# Patient Record
Sex: Male | Born: 1965
Health system: Southern US, Community
[De-identification: ages and names within clinical notes are randomized; demographics above are authoritative.]

## PROBLEM LIST (undated history)

## (undated) DIAGNOSIS — M1990 Unspecified osteoarthritis, unspecified site: Secondary | ICD-10-CM

## (undated) DIAGNOSIS — Z86010 Personal history of colonic polyps: Secondary | ICD-10-CM

## (undated) DIAGNOSIS — M545 Low back pain, unspecified: Secondary | ICD-10-CM

## (undated) DIAGNOSIS — R51 Headache: Secondary | ICD-10-CM

## (undated) DIAGNOSIS — J449 Chronic obstructive pulmonary disease, unspecified: Secondary | ICD-10-CM

## (undated) DIAGNOSIS — R519 Headache, unspecified: Secondary | ICD-10-CM

## (undated) DIAGNOSIS — K219 Gastro-esophageal reflux disease without esophagitis: Secondary | ICD-10-CM

## (undated) DIAGNOSIS — Z9889 Other specified postprocedural states: Secondary | ICD-10-CM

## (undated) DIAGNOSIS — K6289 Other specified diseases of anus and rectum: Secondary | ICD-10-CM

## (undated) DIAGNOSIS — K5792 Diverticulitis of intestine, part unspecified, without perforation or abscess without bleeding: Secondary | ICD-10-CM

## (undated) DIAGNOSIS — K645 Perianal venous thrombosis: Secondary | ICD-10-CM

## (undated) HISTORY — DX: Other specified diseases of anus and rectum: K62.89

## (undated) HISTORY — DX: Headache: R51

## (undated) HISTORY — PX: COLON SURGERY: SHX602

## (undated) HISTORY — DX: Gastro-esophageal reflux disease without esophagitis: K21.9

## (undated) HISTORY — DX: Headache, unspecified: R51.9

## (undated) HISTORY — DX: Other specified postprocedural states: Z98.890

## (undated) HISTORY — DX: Perianal venous thrombosis: K64.5

## (undated) HISTORY — DX: Unspecified osteoarthritis, unspecified site: M19.90

## (undated) HISTORY — DX: Personal history of colonic polyps: Z86.010

---
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
## 1986-07-06 HISTORY — PX: APPENDECTOMY: SHX54

## 1987-07-07 HISTORY — PX: CHOLECYSTECTOMY: SHX55

## 2011-03-04 ENCOUNTER — Ambulatory Visit: Payer: Self-pay

## 2011-05-27 ENCOUNTER — Ambulatory Visit: Payer: Self-pay | Admitting: Gastroenterology

## 2011-05-29 LAB — PATHOLOGY REPORT

## 2011-06-08 ENCOUNTER — Ambulatory Visit (INDEPENDENT_AMBULATORY_CARE_PROVIDER_SITE_OTHER): Payer: BC Managed Care – PPO | Admitting: General Surgery

## 2011-06-08 ENCOUNTER — Encounter (INDEPENDENT_AMBULATORY_CARE_PROVIDER_SITE_OTHER): Payer: Self-pay | Admitting: General Surgery

## 2011-06-08 VITALS — BP 130/92 | HR 66 | Temp 97.9°F | Resp 18 | Ht 69.0 in | Wt 204.0 lb

## 2011-06-08 DIAGNOSIS — K644 Residual hemorrhoidal skin tags: Secondary | ICD-10-CM

## 2011-06-08 MED ORDER — HYDROCODONE-ACETAMINOPHEN 5-325 MG PO TABS: 1.0000 | ORAL_TABLET | ORAL | Status: AC | PRN

## 2011-06-08 NOTE — Patient Instructions (Signed)
Warm tub soaks after bowel movement Bedrest Alternate ice packs and tucks pads Colace stool softener twice a day Pain meds as needed

## 2011-06-12 NOTE — Progress Notes (Signed)
Subjective:     Patient ID: Peter [name], male   DOB: 10/10/1965, 45 y.o.   MRN: [id number]  HPI We are asked to see Peter [name] in consultation by the doctors at [facility] to evaluate him for hemorrhoids. The patient is a 45 year old white male who has noticed a swollen area near his rectum since last Thursday. The area has been painful for him. He has not noticed any blood with his bowel movements. He denies any diarrhea or constipation. He denies any fevers or chills  Review of Systems  Constitutional: Negative.   HENT: Negative.   Eyes: Negative.   Respiratory: Negative.   Cardiovascular: Negative.   Gastrointestinal: Positive for rectal pain.  Genitourinary: Negative.   Musculoskeletal: Negative.   Skin: Negative.   Neurological: Negative.   Hematological: Negative.   Psychiatric/Behavioral: Negative.        Objective:   Physical Exam  Constitutional: He is oriented to person, place, and time. He appears well-developed and well-nourished.  HENT:  Head: Normocephalic and atraumatic.  Eyes: Conjunctivae and EOM are normal. Pupils are equal, round, and reactive to light.  Neck: Normal range of motion. Neck supple.  Cardiovascular: Normal rate, regular rhythm and normal heart sounds.   Pulmonary/Chest: Effort normal and breath sounds normal.  Abdominal: Soft. Bowel sounds are normal.  Genitourinary:       The patient has a moderate sized edematous swollen external hemorrhoid. There is no evidence of thrombosis.  Musculoskeletal: Normal range of motion.  Neurological: He is alert and oriented to person, place, and time.  Skin: Skin is warm and dry.  Psychiatric: He has a normal mood and affect. His behavior is normal.       Assessment:     Swollen and edematous external hemorrhoid    Plan:     I recommended bed rest. I would like him to alternate putting ice packs and Tucks pads on the area. I've also recommended some stool softeners. We will plan to see  him next week to check his progress.I do not think he needs unroofing of this hemorrhoid at this time.

## 2011-06-15 ENCOUNTER — Encounter (INDEPENDENT_AMBULATORY_CARE_PROVIDER_SITE_OTHER): Payer: BC Managed Care – PPO | Admitting: General Surgery

## 2011-09-09 ENCOUNTER — Inpatient Hospital Stay: Payer: Self-pay | Admitting: Surgery

## 2011-09-09 LAB — URINALYSIS, COMPLETE
Bacteria: NONE SEEN
Bilirubin,UR: NEGATIVE
Glucose,UR: NEGATIVE mg/dL (ref 0–75)
Leukocyte Esterase: NEGATIVE
Nitrite: NEGATIVE
RBC,UR: 1 /HPF (ref 0–5)
Squamous Epithelial: NONE SEEN
WBC UR: NONE SEEN /HPF (ref 0–5)

## 2011-09-09 LAB — COMPREHENSIVE METABOLIC PANEL
Albumin: 4.4 g/dL (ref 3.4–5.0)
Alkaline Phosphatase: 55 U/L (ref 50–136)
Anion Gap: 10 (ref 7–16)
Bilirubin,Total: 0.4 mg/dL (ref 0.2–1.0)
Creatinine: 1.02 mg/dL (ref 0.60–1.30)
EGFR (African American): >60
EGFR (Non-African Amer.): >60
Glucose: 95 mg/dL (ref 65–99)
Osmolality: 278 (ref 275–301)
Potassium: 4.1 mmol/L (ref 3.5–5.1)
Sodium: 140 mmol/L (ref 136–145)

## 2011-09-09 LAB — CBC
MCH: 31.2 pg (ref 26.0–34.0)
MCHC: 33.6 g/dL (ref 32.0–36.0)
MCV: 93 fL (ref 80–100)
Platelet: 218 10*3/uL (ref 150–440)
RDW: 13.6 % (ref 11.5–14.5)
WBC: 8.7 10*3/uL (ref 3.8–10.6)

## 2011-09-10 LAB — BASIC METABOLIC PANEL
Anion Gap: 10 (ref 7–16)
Calcium, Total: 8.5 mg/dL (ref 8.5–10.1)
Co2: 27 mmol/L (ref 21–32)
Creatinine: 0.93 mg/dL (ref 0.60–1.30)
EGFR (African American): >60
EGFR (Non-African Amer.): >60
Potassium: 3.5 mmol/L (ref 3.5–5.1)
Sodium: 142 mmol/L (ref 136–145)

## 2011-09-10 LAB — CBC WITH DIFFERENTIAL/PLATELET
Basophil #: 0.1 10*3/uL (ref 0.0–0.1)
Eosinophil #: 0.1 10*3/uL (ref 0.0–0.7)
Lymphocyte #: 1.8 10*3/uL (ref 1.0–3.6)
MCH: 31 pg (ref 26.0–34.0)
MCHC: 33.3 g/dL (ref 32.0–36.0)
Monocyte #: 0.6 10*3/uL (ref 0.0–0.7)
Neutrophil %: 65.3 %
Platelet: 190 10*3/uL (ref 150–440)
RBC: 4.47 10*6/uL (ref 4.40–5.90)
RDW: 13.6 % (ref 11.5–14.5)
WBC: 7.6 10*3/uL (ref 3.8–10.6)

## 2011-09-12 LAB — CBC WITH DIFFERENTIAL/PLATELET
Basophil %: 0.6 %
Eosinophil #: 0.1 10*3/uL (ref 0.0–0.7)
HGB: 14 g/dL (ref 13.0–18.0)
Lymphocyte #: 1.2 10*3/uL (ref 1.0–3.6)
MCH: 31.3 pg (ref 26.0–34.0)
MCHC: 33.9 g/dL (ref 32.0–36.0)
MCV: 92 fL (ref 80–100)
Monocyte #: 0.8 10*3/uL — ABNORMAL HIGH (ref 0.0–0.7)
Neutrophil #: 6.5 10*3/uL (ref 1.4–6.5)
Neutrophil %: 75 %
WBC: 8.7 10*3/uL (ref 3.8–10.6)

## 2011-09-13 LAB — CBC WITH DIFFERENTIAL/PLATELET
Eosinophil #: 0.1 10*3/uL (ref 0.0–0.7)
Eosinophil %: 0.9 %
Lymphocyte #: 1.1 10*3/uL (ref 1.0–3.6)
Lymphocyte %: 13.2 %
Monocyte %: 7.6 %
Neutrophil %: 78.2 %
Platelet: 186 10*3/uL (ref 150–440)
RDW: 13.5 % (ref 11.5–14.5)
WBC: 8.1 10*3/uL (ref 3.8–10.6)

## 2014-10-28 NOTE — Consult Note (Signed)
Chief Complaint:   Subjective/Chief Complaint Overall better. Less abd pain. To be discharged later today, according to patient. Will need elective sigmoid resection. Not scheduled yet.   VITAL SIGNS/ANCILLARY NOTES: **Vital Signs.:   11-Mar-13 10:39   Vital Signs Type Q 4hr   Temperature Temperature (F) 98.3   Celsius 36.8   Temperature Source oral   Pulse Pulse 71   Pulse source per Dinamap   Respirations Respirations 18   Systolic BP Systolic BP 114   Diastolic BP (mmHg) Diastolic BP (mmHg) 77   Mean BP 89   BP Source Dinamap   Pulse Ox % Pulse Ox % 98   Pulse Ox Activity Level  At rest   Oxygen Delivery Room Air/ 21 %   Brief Assessment:   Cardiac Regular    Respiratory clear BS    Gastrointestinal low abd tenderness   Routine Hem:  10-Mar-13 10:52    WBC (CBC) 8.1   RBC (CBC) 4.43   Hemoglobin (CBC) 13.8   Hematocrit (CBC) 40.6   Platelet Count (CBC) 186   MCV 92   MCH 31.2   MCHC 34.1   RDW 13.5   Neutrophil % 78.2   Lymphocyte % 13.2   Monocyte % 7.6   Eosinophil % 0.9   Basophil % 0.1   Neutrophil # 6.3   Lymphocyte # 1.1   Monocyte # 0.6   Eosinophil # 0.1   Basophil # 0.0   Radiology Results: CT:    09-Mar-13 14:32, CT Abdomen and Pelvis With Contrast   CT Abdomen and Pelvis With Contrast    REASON FOR EXAM:    (1) diverticulitis; (2) same  COMMENTS:       PROCEDURE: CT  - CT ABDOMEN / PELVIS  W  - Sep 12 2011  2:32PM     RESULT: Comparison is made to a prior study dated 09/09/2011.    Technique: Helical 3 mm sections were obtained from the lung bases   through the pubic symphysis status post intravenous administration of 100   mL of Isovue-300 and oral contrast.    Findings: The lung bases are unremarkable.    The liver, spleen, adrenals, pancreas, and kidneys are unremarkable.   Calcified densities project in the spleen indicative of granulomas.  There is no CT evidence of bowel obstruction. Within the pelvis findings   are once again  appreciated with the previously described area of   diverticulitis. The focal loculated fluid collection projecting in the   region of the wall of the sigmoid colon is less apparent on the present   study. The area is vaguely appreciated measuring 3.54 x 2.23 cm. No   further evidence of loculated fluid is identified. The urinary bladder is   distended. There is no CT evidence of bowel obstruction.    IMPRESSION:  Findings consistent with the patient's recent diagnosis of   diverticulitis. The region concerning for fluid collection within the   bowel wall possibly representing an abscess is vaguely appreciated and   appears slightly less conspicuous. No new fluid collections are   identified.     Thank you for the opportunity to contribute to the care of your patient.     Verified By: Jani FilesHECTOR W. COOPER, M.D., MD   Assessment/Plan:  Assessment/Plan:   Assessment Diverticulitis. Clinically improving.    Plan Continue Abx. Likely needs surgical resection. To be determined by surgery. Will sign off. Thanks.   Electronic Signatures: Lutricia Feilh, Jasiah Elsen (MD)  (Signed  11-Mar-13 11:57)  Authored: Chief Complaint, VITAL SIGNS/ANCILLARY NOTES, Brief Assessment, Lab Results, Radiology Results, Assessment/Plan   Last Updated: 11-Mar-13 11:57 by Lutricia Feil (MD)

## 2014-10-28 NOTE — Consult Note (Signed)
PATIENT NAME:  Peter [name], Peter [name] MR#:  [id number] DATE OF BIRTH:  June 08, 1966  DATE OF CONSULTATION:  09/09/2011  REFERRING PHYSICIAN:   CONSULTING PHYSICIAN:  Carmie Endalph L. Ely III, MD  PRIMARY CARE PHYSICIAN: Dr. Vonita MossMark Crissman.  ADMITTING PHYSICIAN: Prime Doc.   CHIEF COMPLAINT: Abdominal pain.   BRIEF HISTORY: Peter FalconerMark [name] is [name] 49 year old gentleman seen in the Emergency Room with [name] several month history of abdominal pain exacerbated over the last several days. He was followed by his primary care physician's office with back and abdominal pain. Initial work-up was unremarkable, but his symptoms did not improve. He was seen by the gastroenterology service in consultation. Dr. Lutricia FeilPaul Oh performed an upper and lower endoscopy. Upper endoscopy revealed some mild Barrett's esophagitis thought to be related to his anti-inflammatory medication use. His colonoscopy revealed some evidence of diverticulosis without any evidence of significant neoplastic lesion or other mucosal abnormality. His pain has increased over the last several days to the point that he is now having fever and chills with nausea and vomiting and some loose stools. The pain intensified over the course of the day. He noted [name] black stool this morning, contacted his gastroenterology office and they recommended he present to the  Emergency Room for further evaluation.   He denies any previous problems with diverticulitis. He has no history of hepatitis, yellow jaundice, pancreatitis, peptic ulcer disease. He does have [name] history of appendectomy and cholecystectomy using the open procedure. He has had no other abdominal history. He denies any cardiac disease, hypertension, or diabetes. He has not been hospitalized for anything within the last 12 months.   Work-up in the Emergency Room revealed normal laboratory values for the most part. He did not have any elevation of his white blood cell count. CT scan with contrast revealed some significant bowel  wall thickening in the distal sigmoid colon with rather surrounding inflammatory change suggestive of possible diverticulitis. There was [name] 3 x 3 fluid collection around the wall of the bowel concerning for an abscess. There did not appear to be any evidence of free air.   SOCIAL HISTORY: This gentleman is [name] smoker and drinks alcohol occasionally. He smokes [name] pack of cigarettes [name] day. 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 He is accompanied by his brother this evening.   FAMILY HISTORY: Noncontributory.   MEDICATIONS: He takes no medications regularly.   ALLERGIES: He has no medical allergies.   REVIEW OF SYSTEMS: Largely unremarkable. He has no visual or hearing problems. He has no difficulty swallowing. He has no neck or head pain. He denies any shortness of breath or exercise intolerance. Denies any chest pain or cardiac dysrhythmia. His gastrointestinal symptoms were noted above. He has had some mild dysuria which clears with voiding. He does not have any urgency. He does not have [name] history of urinary tract infections. He has no lower extremity symptoms. He does have some mild chronic back pain. PSYCHIATRIC: Psychiatric review of systems is unremarkable.   PHYSICAL EXAMINATION:  GENERAL: He is an alert, pleasant, comfortable gentleman in no significant distress at the present time. He said his pain is improved.   VITAL SIGNS: Blood pressure 140/78, heart rate 97 and regular. He is afebrile. Oxygen saturation is normal on room air.   HEENT: No scleral icterus or pupillary problems. He has no facial deformity.   NECK: Supple without adenopathy and the trachea is midline.   CHEST: Clear with no adventitious sounds. He has no problem with pulmonary  excursion.   CARDIAC: No murmurs or gallops to my ear. He seems to be in normal sinus rhythm.   ABDOMEN: For the most part is soft, nondistended, with some suprapubic tenderness, but no rebound and no guarding. He does not have any hernias  noted. He does not have any masses noted. He does have active bowel sounds.   EXTREMITIES: Lower extremity exam reveals full range of motion. Good distal pulses. No deformities.   PSYCHIATRIC: Normal affect and normal orientation.   IMPRESSION: I have independently reviewed his CT scan. This gentleman does appear to have [name] pericolonic abscess associated with probable diverticulitis. In the absence of any previously identified mucosal lesions, diverticulitis is the most likely diagnosis in this situation.   RECOMMENDATIONS: I agree with the current plans for intervention. I would treat him with multidose antibiotic therapy. I do not see any acute surgical indications. In the event surgery is required urgently, temporary colostomy will likely be required. I discussed this plan with the patient and his brother. If he recovers uneventfully from this episode, I would like recommend the consideration of an elective colectomy since he had such [name] severe episode and he is [name] young man. We will continue to be available while he is hospitalized and involved in his care.   ____________________________ Carmie End, MD rle:ap D: 09/09/2011 22:16:31 ET T: 09/10/2011 10:02:46 ET JOB#: [id number]  cc: Carmie End, MD, <Dictator> Steele Sizer, MD Ezzard Standing. Bluford Kaufmann, MD Quentin Ore MD ELECTRONICALLY SIGNED 09/10/2011 20:22

## 2014-10-28 NOTE — Consult Note (Signed)
Pt seen and examined. See Dawn Harrison's notes. Pt with diverticulitis and abscess with  LLQ abd tenderness. Agree with Abx. Follow surgical recommendations. THanks.  Electronic Signatures: Lutricia Feilh, Moncerrat Burnstein (MD)  (Signed on 07-Mar-13 13:01)  Authored  Last Updated: 07-Mar-13 13:01 by Lutricia Feilh, Cheyne Boulden (MD)

## 2014-10-28 NOTE — Consult Note (Signed)
Chief Complaint:   Subjective/Chief Complaint Slowly improving. On solids now. No BM since admission.   VITAL SIGNS/ANCILLARY NOTES: **Vital Signs.:   08-Mar-13 10:41   Temperature Temperature (F) 99.5   Celsius 37.5   Temperature Source oral   Pulse Pulse 85   Respirations Respirations 20   Systolic BP Systolic BP 375   Diastolic BP (mmHg) Diastolic BP (mmHg) 76   Mean BP 89   Pulse Ox % Pulse Ox % 96   Pulse Ox Activity Level  At rest   Oxygen Delivery Room Air/ 21 %   Brief Assessment:   Cardiac Regular    Respiratory clear BS    Gastrointestinal mild low abd tenderness   Routine Hem:  07-Mar-13 04:11    WBC (CBC) 7.6   RBC (CBC) 4.47   Hemoglobin (CBC) 13.8   Hematocrit (CBC) 41.5   Platelet Count (CBC) 190   MCV 93   MCH 31.0   MCHC 33.3   RDW 13.6  Routine Chem:  07-Mar-13 04:11    Glucose, Serum 90   BUN 7   Creatinine (comp) 0.93   Sodium, Serum 142   Potassium, Serum 3.5   Chloride, Serum 105   CO2, Serum 27   Calcium (Total), Serum 8.5   Osmolality (calc) 281   eGFR (African American) >60   eGFR (Non-African American) >60   Anion Gap 10  Routine Hem:  07-Mar-13 04:11    Neutrophil % 65.3   Lymphocyte % 24.0   Monocyte % 8.2   Eosinophil % 1.7   Basophil % 0.8   Neutrophil # 4.9   Lymphocyte # 1.8   Monocyte # 0.6   Eosinophil # 0.1   Basophil # 0.1   Assessment/Plan:  Assessment/Plan:   Assessment Diverticulitis. Improving.    Plan Continue Abx. Follow surgical recommendations. Will check back on Sunday if patient still here. Otherwise, call us over the weekend if condition changes. Thanks.   Electronic Signatures: Verdie Shire (MD)  (Signed 08-Mar-13 12:34)  Authored: Chief Complaint, VITAL SIGNS/ANCILLARY NOTES, Brief Assessment, Lab Results, Assessment/Plan   Last Updated: 08-Mar-13 12:34 by Verdie Shire (MD)

## 2014-10-28 NOTE — H&P (Signed)
PATIENT NAME:  Peter [name], Peter [name] MR#:  [id number] DATE OF BIRTH:  Nov 30, 1965  DATE OF ADMISSION:  09/09/2011  REFERRING PHYSICIAN: Dr. Enedina Finner. PRIMARY CARE PHYSICIAN:  Dr. Dossie Arbour. PRIMARY GASTROENTEROLOGIST:  Dr. Lutricia Feil.   CHIEF COMPLAINT: Abdominal pain.  HISTORY OF PRESENT ILLNESS:  The patient is a 49 year old male with past medical history listed below including Barrett's esophagus with a history of esophagitis, likely NSAID-induced secondary to [facility] powder usage, gastroesophageal reflux disease, sigmoid diverticulosis, benign colon polyps, tobacco abuse and bouts of bronchitis who presents to the Emergency Department with the above-mentioned chief complaint. The patient states he has had abdominal pain on and off for the past couple of years which has been worse over the past couple of months. He was referred to gastroenterology as an outpatient and underwent endoscopy, both upper and lower 05/27/2011 Esophagogastroduodenoscopy revealed LA grade A esophagitis with pathology revealing Barrett's esophagus and reflux esophagitis with no dysplasia noted. Colonoscopy revealed sigmoid diverticulosis as well as hyperplastic rectosigmoid polyps which were negative for dysplasia malignancy. He was advised to stop taking NSAIDs/BC powders and was placed on PPI therapy for esophagitis. However, since then he has continued to have abdominal pain over the past couple of months and this has been worse over the past couple of days and 10/10 crampy pain mostly in the bilateral lower quadrants. Also, in the suprapubic region associated with nausea and a couple episodes of nonbloody emesis. He also had one episode of black stools this morning which is semisolid. He called his gastroenterologist and advised to come to the hospital for further evaluation. He also reports subjective fevers and chills. He is currently afebrile and without leukocytosis. He underwent a CT of the abdomen and pelvis with contrast in the ER  revealing sigmoid diverticulitis with diverticular abscess approximately 3.6 x 2.8 cm in size. Thereafter, hospitalist services were contacted for further evaluation and for hospital admission. Otherwise, the patient is without specific complaint at this time.   PAST MEDICAL/SURGICAL HISTORY:  1. Cholecystectomy in 1980s. 2. Appendectomy in 1980s at the same time of his cholecystectomy.  3. Esophagogastroduodenoscopy 05/27/2011 by Dr. Bluford Kaufmann revealing LA grade A esophagitis with pathology revealing Barrett's esophagus and reflux esophagitis negative for dysplasia.  4. Colonoscopy 05/27/2011 by Dr. Bluford Kaufmann revealing sigmoid diverticulosis and hyperplastic rectal polyps status post polypectomy. Pathology was negative for dysplasia and malignancy.  5. Esophagitis noted on esophagogastroduodenoscopy with pathology revealing Barrett's esophagus and reflux esophagitis negative for dysplasia. Also concern for NSAID-induced esophagitis as the patient was taking BC powders frequently.  6. Gastroesophageal reflux disease.  7. Sigmoid diverticulosis on colonoscopy 05/27/2011.  8. Rectosigmoid colon polyps noted on colonoscopy status post polypectomy and pathology revealing hyperplastic polyps negative for dysplasia and malignancy. 9. Tobacco abuse. 10. Bouts of bronchitis.  ALLERGIES: No known drug allergies.   MEDICATIONS: Prilosec 20 mg daily.   FAMILY HISTORY: Mother is alive with history of respiratory problems which are unspecified, rotator cuff problems and gallbladder problems. Father deceased at age 89 secondary to myocardial infarction. He was an alcoholic. He had unspecified malignancy.   SOCIAL HISTORY: Tobacco ongoing, 1 pack per day since childhood. He has been smoking for at least 20 to 30 years. Alcohol occasional. Illicit drugs none. [place] He works in Photographer. One brother who works at the sheriff's office at the jail facility accompanies him today,  name Mohsin Crum, phone number [phone], work phone number [phone].   REVIEW OF SYSTEMS: CONSTITUTIONAL: He  reports subjective fevers and chills and abdominal pain. He denies any weakness or recent changes in weight. He denies fatigue. HEAD/EYES: Denies headache or blurry vision. ENT: Denies tinnitus, earache, nasal discharge, or sore throat. RESPIRATORY: Denies shortness breath, cough, or wheezing. CARDIOVASCULAR: Denies chest pain, heart palpitations or lower extremity edema. GASTROINTESTINAL: Has abdominal pain, nausea, vomiting, semisolid stools. Denies constipation. Has black-colored stool. He has history of gastroesophageal reflux disease. GENITOURINARY: Denies dysuria or hematuria. ENDOCRINE: Denies heat or cold intolerance. HEME/LYMPH: Denies easy bruising. States he has had black-colored stools as above. No prior history of anemia. INTEGUMENTARY: Denies rashes or lesions. MUSCULOSKELETAL: Denies joint or back pain or muscle pain currently. NEUROLOGIC: Denies headache, numbness, weakness, tingling, or dysarthria. PSYCHIATRIC: Denies depression or anxiety.   PHYSICAL EXAMINATION:   VITAL SIGNS: Temperature 98.7, pulse 82, blood pressure 141/80, respirations 18, oxygen saturation 99% on room air.   GENERAL: The patient is alert and oriented. He is in mild distress secondary to abdominal pain.   HEENT: Normocephalic, atraumatic. Pupils are equal, round and reactive to light and accommodation. Extraocular muscles are intact. Anicteric sclerae. Conjunctivae pink. Hearing intact to voice. Nares without drainage. Oral mucous is moist without any lesions noted.   NECK: Supple with full range of motion. No jugular venous distention, lymphadenopathy, or carotid bruits bilaterally. No thyromegaly or tenderness to palpation over thyroid gland.    CHEST: Normal respiratory effort without use of accessory respiratory muscles.   LUNGS: Clear to auscultation bilaterally without crackles, rales or  wheezing.   CARDIOVASCULAR: S1, S2 positive. Regular rate and rhythm. No murmurs, rubs, or gallops. PMI is non-lateralized.   ABDOMEN: Soft, nondistended. He has some bilateral lower quadrant tenderness to palpation. Also, some suprapubic tenderness to palpation as well as some left lower quadrant tenderness to palpation with voluntary guarding. No rebound. No hepatosplenomegaly or palpable masses. No hernias. Hypoactive bowel sounds throughout.   EXTREMITIES: No clubbing, cyanosis, or edema. Pedal pulses are palpable bilaterally.   SKIN: No suspicious rashes or lesions. Skin turgor is good. Skin is warm to touch.   LYMPH: No cervical lymphadenopathy.   NEUROLOGIC: Alert and oriented x3. Cranial nerves 2-12 are grossly intact.   PSYCH: Appropriate affect.   LABORATORY, DIAGNOSTIC, AND RADIOLOGICAL DATA: CBC within normal limits. Lipase 82. Complete metabolic panel within normal limits except for total protein slightly elevated at 8.8 with normal albumin. Urinalysis benign.   CT of the abdomen and pelvis with contrast: Diverticulosis of the sigmoid colon with bowel wall thickening involving the distal sigmoid colon with surrounding inflammatory changes most concerning for diverticulitis with 3.6 x 2.8 cm peripherally enhancing fluid collection within the wall of the bowel concerning for an abscess. No pneumoperitoneum, pneumatosis or portal venous gas. No lymphadenopathy. No abdominal or pelvic free fluid. There is mild right hydroureteronephrosis.   ASSESSMENT AND PLAN: A 48 year old male with history of Barrett's/reflux esophagitis, diverticulosis, benign colon polyps and tobacco abuse, here with abdominal pain, subjective fever, chills, nausea, vomiting, and black stools.  1. Acute colon diverticulitis with diverticular abscess. Recommend hospital admission for further evaluation and management. We will admit the patient to the surgical floor. Start IV antibiotics in the form of Cipro and  Flagyl. Provide pain control. Start IV fluids as well and perform serial abdominal exams. Obtain surgical and gastroenterology consultations. We will monitor the patient's clinical response with IV antibiotics as well as pain medications and fluids and further work-up and management to follow depending on the patient's clinical course.  2. Elevated  blood pressure without diagnosed hypertension, likely pain induced. Will provide pain control and follow blood pressure closely.  3. History of reflux/Barrett's esophagitis. No dysplasia noted on pathology report. Reflux esophagitis was likely nonsteroidal anti-inflammatory drug induced as he was using BC powders frequently in the recent past and no longer is taking any BC powders. We will continue PPI therapy for now.  4. History of gastroesophageal reflux disease. Continue PPI.  5. Tobacco abuse. The patient was strongly counseled on the importance of smoking cessation. Approximately three minutes were spent on smoking cessation counseling. He declined nicotine patch at this time.  6. Mild right hydroureteronephrosis, noted on CT scan with no mention of stones. The patient denies any dysuria. He has normal renal function at this time. Will manage his acute issue being diverticulitis with abscess at this time and monitor clinical response. If his renal function worsens or he develops dysuria, would consider repeat CT scan and urology evaluation.  7. Deep vein thrombosis prophylaxis with SCDs and TEDs.   CODE STATUS: FULL CODE.  TIME SPENT ON THIS ADMISSION: Approximately 45 minutes.  ____________________________ Elon AlasKamran N. Pierre Cumpton, MD knl:ap D: 09/09/2011 22:16:09 ET T: 09/10/2011 07:24:05 ET JOB#: [id number]  cc: Elon AlasKamran N. Kepler Mccabe, MD, <Dictator> Ezzard StandingPaul Y. Bluford Kaufmannh, MD Steele SizerMark A. Crissman, MD Elon AlasKAMRAN N Itzamara Casas MD ELECTRONICALLY SIGNED 09/16/2011 18:22

## 2014-10-28 NOTE — Consult Note (Signed)
Brief Consult Note: Diagnosis: Acute diverticulitis with findings of abcess on CT Scan of abdomen and pelvis.  Known history of diverticulosis noted on colonoscoy 05/2011.  History of LA Grade A reflux esophagitis and Barrett's esophagus.   Consult note dictated.   Comments: Patient's presentation discussed with Dr. Lutricia FeilPaul Oh.  Recommendation is for continued antibiotic therapy.  In agreement with surgical evalaution and recommendations.  Will continue to monitor.  Patient should remain on PPI therapy.  Electronic Signatures: Rodman KeyHarrison, Jeffie Widdowson S (NP)  (Signed 07-Mar-13 12:40)  Authored: Brief Consult Note   Last Updated: 07-Mar-13 12:40 by Rodman KeyHarrison, Giavonni Cizek S (NP)

## 2014-10-28 NOTE — Discharge Summary (Signed)
PATIENT NAME:  Peter [name], Peter [name] MR#:  [id number] DATE OF BIRTH:  12/18/1965  DATE OF ADMISSION:  09/09/2011 DATE OF DISCHARGE:  09/14/2011  FINAL DIAGNOSES:  1. Contained perforation with microabscess sigmoid diverticulitis.  2. Reflux esophagitis.  3. History of bronchitis.  4. Tobacco abuse and dependence.   PRINCIPLE PROCEDURES:  1. CT scan of the abdomen and pelvis x2.  2. Surgical service consultation. 3. Intravenous antibiotics.  4. GI medicine consultation.   HOSPITAL COURSE SUMMARY: The patient was admitted with contained perforation of sigmoid diverticulitis. Both surgery and GI medicine were consulted. Nonoperative treatment was undertaken. The patient had resolution of his symptomatology but pain control was [name] towards the end of his hospitalization. [name] was performed on March 20th which demonstrated resolving small abscess in the pelvis from the sigmoid colon. The patient was advanced to [name], switched over to oral antibiotics consisting of Cipro and Flagyl tolerating these well. He was transferred to the surgical service from Medicine. GI medicine made no recommendations. The patient has had [name] in the past. Of note, he has had multiple episodes of recurrent pain related to sigmoid diverticulitis in the past. He was counseled as to the need for future surgical intervention for colectomy with primary anastomosis.   After tolerating [name] for several days and being transitioned off his narcotics to oral Tylenol and intravenous Toradol, the patient was deemed suitable for discharge on the morning of March 11th in stable condition with outpatient follow-up with me in the office next week on Thursday.   DISCHARGE MEDICATIONS:  1. Prilosec 20 mg by mouth once [name] day.  2. Flagyl 500 mg by mouth every eight hours for seven days.  3. Cipro 500 mg by mouth every 12 hours for seven days.  4. Percocet 5/325 1 tab p.o. every six hours as needed  for pain.   DISCHARGE INSTRUCTIONS: Call with any questions or concerns to the office.  ____________________________ Redge GainerMark [name]. Egbert GaribaldiBird, MD mab:drc D: 09/14/2011 11:50:13 ET T: 09/14/2011 11:55:27 ET JOB#: [id number]  cc: Loraine LericheMark [name]. Egbert GaribaldiBird, MD, <Dictator> Raynald KempMARK [name] Marilu Rylander MD ELECTRONICALLY SIGNED 09/14/2011 17:45

## 2014-10-28 NOTE — Consult Note (Signed)
PATIENT NAME:  Peter [name], Peter [name] MR#:  [id number] DATE OF BIRTH:  1966-07-01  DATE OF CONSULTATION:  09/10/2011  REFERRING PHYSICIAN:  Dr. Cherylann RatelLateef CONSULTING PHYSICIAN: Lutricia FeilPaul Oh, MD / Rodman Keyawn S. Harrison, NP  REASON FOR CONSULTATION: Diverticulitis with abscess, known history of Barrett's.   HISTORY OF PRESENT ILLNESS: [name] is [name] [name] who is known by myself from our office. He does have [name] known history of Barrett's esophagus with [name] history of esophagitis, ANSAID induced, as well as secondary to [facility]. He has history of gastritis reflux as well as sigmoid diverticulosis, benign colonic polyps, tobacco abuse, and bouts of bronchitis. He had notified our office yesterday for the concern of abdominal pain which has been present for [name] couple of weeks causing him to be doubled over in pain as well as nausea and vomiting for two days in length. The patient was advised to [facility] to [facility] for further evaluation. As I was reviewing his chart yesterday, 05/27/2011 colonoscopy did reveal diverticulosis, otherwise without abnormality. An upper endoscopy was done as well on this date which revealed LA grade [name] reflux esophagitis, otherwise with abnormality.   The patient has stopped BC Powder use up until about three weeks ago where he has been taking two on average every other day. He has remained compliant with his PPI therapy. The patient questions whether he had [name] fever at home. He has been experiencing pain bilaterally to lower quadrants of his abdomen, more suprapubic. Excessive rumbling sensation. Significant for chills. No hemoptysis or hematemesis. Black-colored stool intermittently but has been taking Pepto-Bismol. Bowels have been moving on average once [name] day. Half of the time it is loose and other times it is firm consistency. No reflux. No dysphagia. No shortness of breath or chest pain. Significant for weight loss over the past couple of weeks in association  with poor appetite.   PAST MEDICAL/SURGICAL HISTORY:  1. Cholecystectomy in the 1980s.  2. Appendectomy in the 1980s.  3. EGD 05/27/2011 by Dr. Bluford Kaufmannh revealing evidence of LA grade [name] esophagitis as well as pathology revealing evidence of Barrett's esophagus and reflux esophagitis.  4. Colonoscopy 05/27/2011 performed by Dr. Lutricia FeilPaul Oh revealing evidence of sigmoid diverticulosis and hyperplastic rectal polyps.  5. Gastroesophageal reflux disease.  6. Tobacco abuse.  7. Bronchitis/chronic obstructive pulmonary disease. 8. Chronic back pain.  9. Tonsillectomy.  FAMILY HISTORY: Father with history of colon cancer diagnosed at the age of 49. Uncle with history of colon cancer, unknown age. No history of colonic polyps. Mother with history of respiratory condition and rotator cuff problems as well as gallbladder problems. Father deceased at the age of 49 secondary to myocardial infarction, alcoholic.   SOCIAL HISTORY: Continued tobacco use, one pack of cigarettes [name] day. Smoking for at least 20 to 30 years. Five alcoholic drinks [name] week. No recreational drug use. 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 Brother works in the [employer] department at [name] [employer] facility is present with him, Psychologist, sport and exerciseBrian Nesheim.   REVIEW OF SYSTEMS: CONSTITUTIONAL: Significant for fevers, chills, AND abdominal pain. Significant for weight loss in correlation with GI symptoms, onset. HEENT: No headaches. No blurred vision. No earache. No nasal discharge sore throat. RESPIRATORY: No shortness of breath, coughing, or wheezing. CARDIOVASCULAR: No chest pain, heart palpitations, or lower extremity edema. GI: See history of present illness. GU: No dysuria or hematuria. ENDOCRINE: No heat or cold intolerance. HEME/LYMPH: Significant for easy bruising and bleeding. Black-colored  stools possibly in correlation with taking even taking Pepto-Bismol. No history of anemia. INTEGUMENTARY: No rashes. No lesions. MUSCULOSKELETAL:  Denies any joint pain or myalgias, but significant for history of chronic back pain. NEUROLOGIC: No headaches. No numbness, CVA, transient ischemic attack, or seizure disorders. PSYCH: No depression. No anxiety.   PHYSICAL EXAMINATION:   VITAL SIGNS: Temperature 98, pulse 72, respirations 20, blood pressure 98/57, pulse oximetry 97% on room air.   GENERAL: Well developed, well nourished [name], in no acute distress noted but does appear to not be feeling well, hot to touch.   HEENT: Normocephalic, atraumatic. Pupils equal and reactive to light. Conjunctivae clear. Sclerae anicteric.   NECK: Supple. Trachea midline. No lymphadenopathy or thyromegaly.   PULMONARY: Symmetric rise and fall of chest. Clear to auscultation throughout.   CARDIOVASCULAR: Regular rate and rhythm. Normal S1, S2. No murmurs. No gallops.   ABDOMEN: Soft, nondistended. Bowel sounds in all four quadrants. Tenderness noted throughout entire abdomen, more localized to suprapubic as well as bilateral lower quadrants of abdomen. No evidence of hepatosplenomegaly, masses, or bruits.   RECTAL: Deferred.   MUSCULOSKELETAL: Moving all four extremities. No contractures. No clubbing.   EXTREMITIES: No edema.   PSYCH: Alert and oriented x4. Memory grossly intact. Appropriate affect and mood.   NEUROLOGICAL: No gross neurological deficits.   LABS/STUDIES: Chemistry panel on admission within normal limits. Comparison today's date 09/10/2011 within normal limits. Hepatic panel on admission: Total protein was elevated at 8.8, otherwise within normal limits. CBC on admission within normal limits as well as CBC done on 09/10/2011 and differential within normal limits.   Blood culture x2: No growth in 8 to 12 hours.   Urinalysis unremarkable.   CT scan of abdomen and pelvis done on admission 09/09/2011 revealed lungs to be clear. No pneumothorax. Heart size was normal. Liver demonstrates no focal  abnormality. There was no intrahepatic or extrahepatic biliary ductal dilatation. The gallbladder was unremarkable. Spleen demonstrates no focal abnormality. There is [name] mild right hydroureteronephrosis. The left kidney, adrenal glands and pancreas are normal. The bladder was normal. The stomach, duodenum, small intestine, and large intestine demonstrates no contrast extravasation or dilatation. There is bowel wall thickening involving the descending colon with mild surrounding inflammatory changes. There is [name] 3.6 x 2.8 cm peripherally enhancing fluid collection in the wall of the bowel containing an abscess. There is diverticulosis noted in the sigmoid colon. No pneumoperitoneum or pneumatosis or portal venous gas.   IMPRESSION: Acute diverticulitis with evidence of abscess, known history of reflux esophagitis as well as Barrett's esophagus.   PLAN: The patient's presentation was discussed with Dr. Lutricia Feil. The patient will remain on antibiotic therapy as ordered. The patient has undergone surgical evaluation and in agreement with recommendation for continued antibiotic therapy at this time and if improvement is not noted for the patient to undergo surgical intervention. We will continue to monitor the patient's laboratory studies as well as response to pain management as well as antibiotic therapy during his hospitalization. Otherwise, no further gastrointestinal recommendations at this time other than for him to remain on PPI therapy given his history of reflux esophagitis as well as Barrett's esophagus.   These services provided by Rodman Key, MS, APRN, [facility], FNP under collaborative agreement with Dr. Lutricia Feil.  Thank you for allowing [place] to participate in the care of [name]. ____________________________ Rodman Key, NP dsh:slb D: 09/10/2011 12:39:08 ET T: 09/10/2011 12:58:35 ET JOB#: [id number]  cc: Burnadette Pop.  Romeo Apple, NP, <Dictator> Rodman Key MD ELECTRONICALLY SIGNED 09/10/2011  16:50

## 2014-12-31 ENCOUNTER — Emergency Department
Admission: EM | Admit: 2014-12-31 | Discharge: 2014-12-31 | Disposition: A | Discharge status: Home / Self Care | Payer: Commercial Indemnity | Attending: Emergency Medicine | Admitting: Emergency Medicine

## 2014-12-31 ENCOUNTER — Encounter: Payer: Self-pay | Admitting: *Deleted

## 2014-12-31 DIAGNOSIS — Z720 Tobacco use: Secondary | ICD-10-CM | POA: Diagnosis not present

## 2014-12-31 DIAGNOSIS — N12 Tubulo-interstitial nephritis, not specified as acute or chronic: Secondary | ICD-10-CM | POA: Insufficient documentation

## 2014-12-31 DIAGNOSIS — R109 Unspecified abdominal pain: Secondary | ICD-10-CM | POA: Diagnosis present

## 2014-12-31 HISTORY — DX: Diverticulitis of intestine, part unspecified, without perforation or abscess without bleeding: K57.92

## 2014-12-31 LAB — URINALYSIS COMPLETE WITH MICROSCOPIC (ARMC ONLY)
BILIRUBIN URINE: NEGATIVE
GLUCOSE, UA: NEGATIVE mg/dL
HGB URINE DIPSTICK: NEGATIVE
Ketones, ur: NEGATIVE mg/dL
NITRITE: NEGATIVE
Protein, ur: NEGATIVE mg/dL
SQUAMOUS EPITHELIAL / LPF: NONE SEEN
Specific Gravity, Urine: 1.02 (ref 1.005–1.030)
pH: 7 (ref 5.0–8.0)

## 2014-12-31 LAB — COMPREHENSIVE METABOLIC PANEL
ALBUMIN: 3.8 g/dL (ref 3.5–5.0)
ALT: 11 U/L — ABNORMAL LOW (ref 17–63)
AST: 15 U/L (ref 15–41)
Alkaline Phosphatase: 54 U/L (ref 38–126)
Anion gap: 7 (ref 5–15)
BUN: 17 mg/dL (ref 6–20)
CALCIUM: 9.2 mg/dL (ref 8.9–10.3)
CO2: 31 mmol/L (ref 22–32)
CREATININE: 1 mg/dL (ref 0.61–1.24)
Chloride: 103 mmol/L (ref 101–111)
GFR calc Af Amer: >60 mL/min (ref >60)
Glucose, Bld: 108 mg/dL — ABNORMAL HIGH (ref 65–99)
POTASSIUM: 4.8 mmol/L (ref 3.5–5.1)
Sodium: 141 mmol/L (ref 135–145)
Total Bilirubin: 0.2 mg/dL — ABNORMAL LOW (ref 0.3–1.2)
Total Protein: 7.4 g/dL (ref 6.5–8.1)

## 2014-12-31 LAB — LIPASE, BLOOD: LIPASE: 34 U/L (ref 22–51)

## 2014-12-31 LAB — CBC WITH DIFFERENTIAL/PLATELET
Basophils Absolute: 0.1 10*3/uL (ref 0–0.1)
Basophils Relative: 1 %
Eosinophils Absolute: 0.1 10*3/uL (ref 0–0.7)
Eosinophils Relative: 1 %
HCT: 44.2 % (ref 40.0–52.0)
HEMOGLOBIN: 14.7 g/dL (ref 13.0–18.0)
Lymphocytes Relative: 19 %
Lymphs Abs: 1.2 10*3/uL (ref 1.0–3.6)
MCH: 30.3 pg (ref 26.0–34.0)
MCHC: 33.1 g/dL (ref 32.0–36.0)
MCV: 91.6 fL (ref 80.0–100.0)
MONO ABS: 0.4 10*3/uL (ref 0.2–1.0)
MONOS PCT: 7 %
NEUTROS ABS: 4.6 10*3/uL (ref 1.4–6.5)
Neutrophils Relative %: 72 %
Platelets: 246 10*3/uL (ref 150–440)
RBC: 4.83 MIL/uL (ref 4.40–5.90)
RDW: 13.7 % (ref 11.5–14.5)
WBC: 6.3 10*3/uL (ref 3.8–10.6)

## 2014-12-31 MED ORDER — SULFAMETHOXAZOLE-TRIMETHOPRIM 800-160 MG PO TABS: 1.0000 | ORAL_TABLET | Freq: Two times a day (BID) | ORAL | Status: DC

## 2014-12-31 MED ORDER — ONDANSETRON HCL 4 MG PO TABS: 4.0000 mg | ORAL_TABLET | Freq: Three times a day (TID) | ORAL | Status: DC | PRN

## 2014-12-31 MED ORDER — CEFTRIAXONE SODIUM 1 G IJ SOLR
INTRAMUSCULAR | Status: AC
  Administered 2014-12-31: 1 g via INTRAMUSCULAR
  Filled 2014-12-31: qty 10

## 2014-12-31 MED ORDER — CEFTRIAXONE SODIUM 1 G IJ SOLR
1.0000 g | Freq: Once | INTRAMUSCULAR | Status: AC
  Administered 2014-12-31: 1 g via INTRAMUSCULAR

## 2014-12-31 MED ORDER — HYDROCODONE-ACETAMINOPHEN 5-325 MG PO TABS: 1.0000 | ORAL_TABLET | Freq: Four times a day (QID) | ORAL | Status: DC | PRN

## 2014-12-31 MED ORDER — LIDOCAINE HCL (PF) 1 % IJ SOLN
2.0000 mL | Freq: Once | INTRAMUSCULAR | Status: AC
  Administered 2014-12-31: 2 mL via INTRADERMAL

## 2014-12-31 MED ORDER — LIDOCAINE HCL (PF) 1 % IJ SOLN
INTRAMUSCULAR | Status: AC
  Administered 2014-12-31: 2 mL via INTRADERMAL
  Filled 2014-12-31: qty 5

## 2014-12-31 NOTE — ED Provider Notes (Signed)
[facility] Emergency Department Provider Note   ____________________________________________  Time seen: 1 PM I have reviewed the triage vital signs and the triage nursing note.  HISTORY  Chief Complaint Abdominal Pain   Historian Patient  HPI Tripp A. Morina is a 49 y.o. male who is side lower abdominal pain suprapubically and left sided for about 2 days. He's had some nausea without any vomiting, fever, diarrhea, or black or bloody stools. Symptoms are moderate. He had trouble at work today due to the pain and nausea. He's felt something similar to this in the past when he had a microperforation with diverticulitis. Symptoms are moderate    Past Medical History  Diagnosis Date  . Arthritis   . GERD (gastroesophageal reflux disease)   . Generalized headaches   . Rectal pain   . Thrombosed hemorrhoids   . Diverticulitis     Patient Active Problem List   Diagnosis Date Noted  . External hemorrhoid 06/08/2011    Past Surgical History  Procedure Laterality Date  . Cholecystectomy  1989  . Tonsillectomy  1970    Current Outpatient Rx  Name  Route  Sig  Dispense  Refill  . HYDROcodone-acetaminophen (NORCO/VICODIN) 5-325 MG per tablet   Oral   Take 1 tablet by mouth every 6 (six) hours as needed for moderate pain.   10 tablet   0   . ondansetron (ZOFRAN) 4 MG tablet   Oral   Take 1 tablet (4 mg total) by mouth every 8 (eight) hours as needed for nausea or vomiting.   10 tablet   1   . sulfamethoxazole-trimethoprim (BACTRIM DS,SEPTRA DS) 800-160 MG per tablet   Oral   Take 1 tablet by mouth 2 (two) times daily.   20 tablet   0     Allergies Review of patient's allergies indicates no known allergies.  No family history on file.  Social History History  Substance Use Topics  . Smoking status: Current Every Day Smoker -- 1.00 packs/day    Types: Cigarettes  . Smokeless tobacco: Never Used  . Alcohol Use: No    Review of  Systems  Constitutional: Negative for fever. Eyes: Negative for visual changes. ENT: Negative for sore throat. Cardiovascular: Negative for chest pain. Respiratory: Negative for shortness of breath. Gastrointestinal: Negative for vomiting and diarrhea. Genitourinary: Negative for dysuria. Musculoskeletal: Negative for back pain. Skin: Negative for rash. Neurological: Negative for headaches, focal weakness or numbness. 10 point Review of Systems otherwise negative ____________________________________________   PHYSICAL EXAM:  VITAL SIGNS: ED Triage Vitals  Enc Vitals Group     BP 12/31/14 0952 149/1 mmHg     Pulse Rate 12/31/14 0952 80     Resp 12/31/14 0952 20     Temp 12/31/14 0952 98 F (36.7 C)     Temp Source 12/31/14 0952 Oral     SpO2 12/31/14 0952 97 %     Weight 12/31/14 0952 225 lb (102.059 kg)     Height 12/31/14 0952  (1.753 m)     Head Cir --      Peak Flow --      Pain Score 12/31/14 0953 6     Pain Loc --      Pain Edu? --      Excl. in GC? --      Constitutional: Alert and oriented. Well appearing and in no distress. Eyes: Conjunctivae are normal. PERRL. Normal extraocular movements. ENT   Head: Normocephalic and atraumatic.  Nose: No congestion/rhinnorhea.   Mouth/Throat: Mucous membranes are moist.   Neck: No stridor. Cardiovascular/Chest: Normal rate, regular rhythm.  No murmurs, rubs, or gallops. Respiratory: Normal respiratory effort without tachypnea nor retractions. Breath sounds are clear and equal bilaterally. No wheezes/rales/rhonchi. Gastrointestinal: Soft. No distention, no guarding, no rebound. Moderate suprapubic and left-sided abdominal tenderness including left CVA tenderness.  Genitourinary/rectal:Deferred Musculoskeletal: Nontender with normal range of motion in all extremities. No joint effusions.  No lower extremity tenderness nor edema. Neurologic:  Normal speech and language. No gross focal neurologic deficits  are appreciated. Skin:  Skin is warm, dry and intact. No rash noted. Psychiatric: Mood and affect are normal. Speech and behavior are normal. Patient exhibits appropriate insight and judgment.  ____________________________________________   EKG I, Governor Rooksebecca Verley Pariseau, MD, the attending physician have personally viewed and interpreted all ECGs.  None  ____________________________________________  LABS (pertinent positives/negatives)  Urinalysis positive for leukocytes trace, RBC 6-30, too numerous to count white blood cells, rare bacteria CBC and metabolic panel within normal limits Lipase within normal limits  ____________________________________________  RADIOLOGY All Xrays were viewed by me. Imaging interpreted by Radiologist.  None __________________________________________  PROCEDURES  Procedure(s) performed: None Critical Care performed: None  ____________________________________________   ED COURSE / ASSESSMENT AND PLAN  CONSULTATIONS: None  Pertinent labs & imaging results that were available during my care of the patient were reviewed by me and considered in my medical decision making (see chart for details).   Overall well-appearing, with a urinalysis consistent with urinary tract infection. Given his symptoms up into the flank, and was treated for pyelonephritis. Patient is well-known to treat as an outpatient. He is given an IM dose here prior to discharge. His pain was 7 out of 10 however he asked to just go home with prescription so that he could drive himself home.  Patient / Family / Caregiver informed of clinical course, medical decision-making process, and agree with plan.   I discussed return precautions, follow-up instructions, and discharged instructions with patient and/or family.  ___________________________________________   FINAL CLINICAL IMPRESSION(S) / ED DIAGNOSES   Final diagnoses:  Pyelonephritis    FOLLOW UP  Referred to: Primary  care physician   Governor Rooksebecca Marlyn Tondreau, MD 12/31/14 1314

## 2014-12-31 NOTE — ED Notes (Signed)
Left groin low abd pain , some nausea

## 2014-12-31 NOTE — ED Notes (Signed)
Dr. Shaune PollackLord notified that rocephin IM has to reconstituted with sterile water or lidocaine.  Verbal order received for lidocaine.

## 2014-12-31 NOTE — Discharge Instructions (Signed)
Return to the emergency department for any new or worsening condition including inability to urinate, fever, worsening pain, black or bloody stools, or any other symptoms concerning to you.   Pyelonephritis, Adult Pyelonephritis is a kidney infection. A kidney infection can happen quickly, or it can last for a long time. HOME CARE   Take your medicine (antibiotics) as told. Finish it even if you start to feel better.  Keep all doctor visits as told.  Drink enough fluids to keep your pee (urine) clear or pale yellow.  Only take medicine as told by your doctor. GET HELP RIGHT AWAY IF:   You have a fever or lasting symptoms for more than 2-3 days.  You have a fever and your symptoms suddenly get worse.  You cannot take your medicine or drink fluids as told.  You have chills and shaking.  You feel very weak or pass out (faint).  You do not feel better after 2 days. MAKE SURE YOU:  Understand these instructions.  Will watch your condition.  Will get help right away if you are not doing well or get worse. Document Released: 07/30/2004 Document Revised: 12/22/2011 Document Reviewed: 12/10/2010 [facility] Patient Information 2015 [employer] [place] [place]. This information is not intended to replace advice given to you by your health care provider. Make sure you discuss any questions you have with your health care provider.

## 2015-01-02 LAB — URINE CULTURE: Culture: >=100000

## 2015-04-16 ENCOUNTER — Other Ambulatory Visit: Payer: Self-pay | Admitting: Orthopedic Surgery

## 2015-04-16 DIAGNOSIS — M5412 Radiculopathy, cervical region: Secondary | ICD-10-CM

## 2015-04-26 ENCOUNTER — Ambulatory Visit
Admission: RE | Admit: 2015-04-26 | Discharge: 2015-04-26 | Disposition: A | Discharge status: Home / Self Care | Payer: Commercial Indemnity | Source: Ambulatory Visit | Attending: Orthopedic Surgery | Admitting: Orthopedic Surgery

## 2015-04-26 DIAGNOSIS — M47892 Other spondylosis, cervical region: Secondary | ICD-10-CM | POA: Diagnosis not present

## 2015-04-26 DIAGNOSIS — M4802 Spinal stenosis, cervical region: Secondary | ICD-10-CM | POA: Diagnosis not present

## 2015-04-26 DIAGNOSIS — G9589 Other specified diseases of spinal cord: Secondary | ICD-10-CM | POA: Diagnosis not present

## 2015-04-26 DIAGNOSIS — M5412 Radiculopathy, cervical region: Secondary | ICD-10-CM | POA: Diagnosis present

## 2015-06-18 ENCOUNTER — Other Ambulatory Visit: Payer: Self-pay | Admitting: Orthopedic Surgery

## 2015-06-18 DIAGNOSIS — M25511 Pain in right shoulder: Secondary | ICD-10-CM

## 2015-06-22 ENCOUNTER — Encounter: Payer: Self-pay | Admitting: Family Medicine

## 2015-06-22 DIAGNOSIS — M25511 Pain in right shoulder: Secondary | ICD-10-CM | POA: Insufficient documentation

## 2015-06-22 DIAGNOSIS — M50223 Other cervical disc displacement at C6-C7 level: Secondary | ICD-10-CM | POA: Insufficient documentation

## 2015-06-22 DIAGNOSIS — M25512 Pain in left shoulder: Secondary | ICD-10-CM | POA: Insufficient documentation

## 2015-06-22 DIAGNOSIS — M5412 Radiculopathy, cervical region: Secondary | ICD-10-CM | POA: Insufficient documentation

## 2015-06-22 DIAGNOSIS — G8929 Other chronic pain: Secondary | ICD-10-CM | POA: Insufficient documentation

## 2015-07-09 ENCOUNTER — Ambulatory Visit: Payer: Managed Care, Other (non HMO)

## 2015-07-22 ENCOUNTER — Other Ambulatory Visit: Payer: Self-pay | Admitting: Gastroenterology

## 2015-07-22 DIAGNOSIS — K5730 Diverticulosis of large intestine without perforation or abscess without bleeding: Secondary | ICD-10-CM

## 2015-07-23 ENCOUNTER — Ambulatory Visit
Admission: RE | Admit: 2015-07-23 | Discharge: 2015-07-23 | Disposition: A | Discharge status: Home / Self Care | Payer: Managed Care, Other (non HMO) | Source: Ambulatory Visit | Attending: Gastroenterology | Admitting: Gastroenterology

## 2015-07-23 DIAGNOSIS — K5720 Diverticulitis of large intestine with perforation and abscess without bleeding: Secondary | ICD-10-CM | POA: Diagnosis not present

## 2015-07-23 DIAGNOSIS — K5730 Diverticulosis of large intestine without perforation or abscess without bleeding: Secondary | ICD-10-CM | POA: Diagnosis present

## 2015-07-23 DIAGNOSIS — I700 Atherosclerosis of aorta: Secondary | ICD-10-CM | POA: Diagnosis not present

## 2015-07-23 MED ORDER — IOHEXOL 300 MG/ML  SOLN
100.0000 mL | Freq: Once | INTRAMUSCULAR | Status: AC | PRN
  Administered 2015-07-23: 100 mL via INTRAVENOUS

## 2015-07-24 ENCOUNTER — Other Ambulatory Visit: Payer: Self-pay

## 2015-07-25 ENCOUNTER — Ambulatory Visit: Payer: Self-pay | Admitting: Surgery

## 2015-07-25 ENCOUNTER — Other Ambulatory Visit: Payer: Self-pay

## 2015-07-25 ENCOUNTER — Encounter (INDEPENDENT_AMBULATORY_CARE_PROVIDER_SITE_OTHER): Payer: Self-pay

## 2015-07-25 ENCOUNTER — Ambulatory Visit (INDEPENDENT_AMBULATORY_CARE_PROVIDER_SITE_OTHER): Payer: Managed Care, Other (non HMO) | Admitting: Surgery

## 2015-07-25 ENCOUNTER — Encounter: Payer: Self-pay | Admitting: Surgery

## 2015-07-25 VITALS — BP 123/74 | HR 61 | Temp 98.2°F | Ht 69.0 in | Wt 213.0 lb

## 2015-07-25 DIAGNOSIS — K5732 Diverticulitis of large intestine without perforation or abscess without bleeding: Secondary | ICD-10-CM | POA: Diagnosis not present

## 2015-07-25 DIAGNOSIS — N321 Vesicointestinal fistula: Secondary | ICD-10-CM

## 2015-07-25 MED ORDER — VARENICLINE TARTRATE 0.5 MG X 11 & 1 MG X 42 PO MISC: ORAL | Status: DC

## 2015-07-25 NOTE — Progress Notes (Addendum)
Subjective:     Patient ID: Peter [name], male   DOB: [birth year], 50 y.o.   MRN: [id number]  HPI   50 year old male history of diverticulitis , he recently went through a bowel about 2 months ago. Patient states about 2 weeks ago. Began to have urinary symptoms of pain burning foul smell and air when he urinates. He was seen by Dr. Renda Rolls who referred him to me for further surgical management. Patient states that he has had diverticulitis many times approximately 7 since about the year 2002. Patient states that his previous times he has been able to be treated with antibiotics was hoping this time would be the same. Patient states these no longer having left lower quadrant pain or diarrhea. Patient denies any fever chills nausea or vomiting or abdominal pain at this time. He states that his dysuria, frequency and urgency are improving on antibiotic which was started 3 days ago.   Patient  Had a colonoscopy in 2012 with Dr. Bluford Kaufmann where they found polyps,  He was supposed to have another colonoscopy last year however has not returned yet.   Past Medical History  Diagnosis Date  . Arthritis   . GERD (gastroesophageal reflux disease)   . Generalized headaches   . Rectal pain   . Thrombosed hemorrhoids   . Diverticulitis    Past Surgical History  Procedure Laterality Date  . Cholecystectomy  1989  . Tonsillectomy  1970  . Appendectomy  1988   Family History  Problem Relation Age of Onset  . Cholecystitis Mother   . Lung cancer Father   . Emphysema Paternal Grandmother   . Diabetes Paternal Grandmother    Social History   Social History  . Marital Status: Single    Spouse Name: N/A  . Number of Children: N/A  . Years of Education: N/A   Social History Main Topics  . Smoking status: Current Every Day Smoker -- 1.00 packs/day    Types: Cigarettes  . Smokeless tobacco: Never Used  . Alcohol Use: No  . Drug Use: No  . Sexual Activity: Not Asked   Other Topics Concern  . None    Social History Narrative    Current outpatient prescriptions:  .  ciprofloxacin (CIPRO) 500 MG tablet, Take 1 tablet by mouth 2 (two) times daily., Disp: , Rfl:  .  HYDROcodone-acetaminophen (NORCO/VICODIN) 5-325 MG per tablet, Take 1 tablet by mouth every 6 (six) hours as needed for moderate pain., Disp: 10 tablet, Rfl: 0 .  hyoscyamine (LEVSIN, ANASPAZ) 0.125 MG tablet, Take by mouth., Disp: , Rfl:  .  metroNIDAZOLE (FLAGYL) 500 MG tablet, Take 1 tablet by mouth 3 (three) times daily., Disp: , Rfl:  .  varenicline (CHANTIX PAK) 0.5 MG X 11 & 1 MG X 42 tablet, Take one 0.5 mg tablet by mouth once daily for 3 days, then increase to one 0.5 mg tablet twice daily for 4 days, then increase to one 1 mg tablet twice daily., Disp: 53 tablet, Rfl: 0 No Known Allergies     Review of Systems  Constitutional: Negative for fever, chills, activity change, appetite change, fatigue and unexpected weight change.  HENT: Negative for congestion and sore throat.   Respiratory: Negative for apnea, cough, shortness of breath and stridor.   Cardiovascular: Negative for chest pain, palpitations and leg swelling.  Gastrointestinal: Negative for nausea, vomiting, abdominal pain, diarrhea, constipation, blood in stool and abdominal distention.  Genitourinary: Positive for dysuria, urgency and hematuria. Negative for  flank pain.  Musculoskeletal: Negative for back pain and neck pain.  Skin: Negative for color change, pallor, rash and wound.  Neurological: Negative for dizziness and weakness.  Hematological: Negative for adenopathy. Does not bruise/bleed easily.  Psychiatric/Behavioral: Negative for decreased concentration and agitation.  All other systems reviewed and are negative.    Filed Vitals:   07/25/15 1014  BP: 123/74  Pulse: 61  Temp: 98.2 F (36.8 C)       Objective:   Physical Exam  Constitutional: He is oriented to person, place, and time. He appears well-developed and well-nourished.   HENT:  Head: Normocephalic and atraumatic.  Right Ear: External ear normal.  Left Ear: External ear normal.  Nose: Nose normal.  Mouth/Throat: Oropharynx is clear and moist. No oropharyngeal exudate.  Eyes: Conjunctivae and EOM are normal. Pupils are equal, round, and reactive to light. No scleral icterus.  Neck: Normal range of motion. Neck supple. No tracheal deviation present. No thyromegaly present.  Cardiovascular: Normal rate, regular rhythm, normal heart sounds and intact distal pulses.  Exam reveals no gallop and no friction rub.   No murmur heard. Pulmonary/Chest: Effort normal and breath sounds normal. No respiratory distress. He has no wheezes. He has no rales.  Abdominal: Soft. Bowel sounds are normal. He exhibits no distension. There is no tenderness. There is no rebound and no guarding.  Well healed surgical scar in RUQ   Musculoskeletal: Normal range of motion. He exhibits no edema or tenderness.  Neurological: He is alert and oriented to person, place, and time. No cranial nerve deficit.  Skin: Skin is warm and dry. No rash noted. No erythema. No pallor.  Psychiatric: He has a normal mood and affect. His behavior is normal. Judgment and thought content normal.  Vitals reviewed.      Assessment:     50 yr old with colovesical fistula from Chronic sigmoid diverticulitis    Plan:      I have personally reviewed his past medical history, including his past bouts of diverticulitis, as well as his visit with Dr. Renda Rolls and his primary care provider regarding his diverticulitis. I have personally reviewed his laboratory values,  Showing a positive UA with Escherichia coli in his urine , however no elevated white blood cell count. I have personally reviewed his CT scan of his abdomen and pelvis that was performed that showed thickening and inflammation of his sigmoid colon and a connection between the sigmoid colon and the bladder with inflammation surrounding a portion of  the bladder. I have also reviewed his radiology reads that revealed the same as above   I discussed the risk benefits alternatives and treatment options with the patient to include  Laparoscopic resection of the colon and repair of his colovesical fistula.   I discussed with the patient that the problem would not improve over time and with antibiotics could only temporize his symptoms. I discussed with them that the risk of surgery were bleeding, infection, anastomotic leak, abscess formation, need for drain placement, damage to the ureters, damage to bladder, need for prolonged catheter after surgery, possible need for blood products during surgery, anesthetic risk of heart attack stroke and blood clots and death, possible need for reoperation , possible need for ostomy placement.   I also discussed with him that his length of hospital stay will be anywhere between 5-10 days and that he would need a Foley catheter for at least a week to 14 days after the procedure. I also discussed  with him that he will not be able to return to work as he does heavy lifting for at least 6 weeks after the procedure.   Since he has not had a colonoscopy prior to this we'll set him up with my partner Dr. Servando Snare  for 08/16/2015.    I will have him see the urologist as well as I will need them to place stents and  help repair the bladder.      I also discussed his tobacco use , and recommended that he stop smoking. I discussed with him different options such as nicotine,, nicotine patches, Wellbutrin or Chantix to help him quit. Patient stated that he would like to quit and would like Chantix to help. I discussed with him that he will start taking the Chantix and within 7-10 days should pick a date to stop smoking at that time. Also discussed with him that he should use nicotine down to help with the cravings during that time as well.   I will have him return to the office on 08/20/2015 to finish discussing surgery and set up  everything at that time for him to have the procedure in early March.

## 2015-07-25 NOTE — Patient Instructions (Addendum)
Unfortunately you have to have surgery. You will be at the hospital for 5-10+ days at the hospital. You will have a catheter placed for about two weeks to drain your bladder. Recovery will be around 6+ weeks. We will set your surgery by the beginning of March.   We will schedule an appointment with the Urologist and call you to let you know when it is.  Your appointment for your colonoscopy will on 08/16/2015 at the [facility].   Your follow up appointment with Dr. Orvis Brill will be on 08/20/2015 at 8:00 AM. This appointment will be at our [place] office.  Chantix was sent to your pharmacy. However, you are able to try chewing gum to help you with the cravings.

## 2015-07-29 ENCOUNTER — Telehealth: Payer: Self-pay

## 2015-07-29 NOTE — Telephone Encounter (Signed)
Patient has an appointment to go to [facility] on 08/02/2015.  Colonoscopy is scheduled on 08/16/2015 with Dr. Servando Snare.  Follow Up appointment with Dr. Orvis Brill on 08/20/2015.

## 2015-08-02 ENCOUNTER — Encounter: Payer: Self-pay | Admitting: Urology

## 2015-08-02 ENCOUNTER — Ambulatory Visit (INDEPENDENT_AMBULATORY_CARE_PROVIDER_SITE_OTHER): Payer: Managed Care, Other (non HMO) | Admitting: Urology

## 2015-08-02 VITALS — BP 122/78 | HR 85 | Ht 69.0 in | Wt 217.5 lb

## 2015-08-02 DIAGNOSIS — N321 Vesicointestinal fistula: Secondary | ICD-10-CM | POA: Diagnosis not present

## 2015-08-02 NOTE — Progress Notes (Signed)
08/02/2015 9:37 AM   Peter [name] 1965-10-19 [id number]  Referring provider: Gabriel Cirri, NP [address], [place] [address]  Chief Complaint  Patient presents with  . New Patient (Initial Visit)    colovesical fistula from Chronic sigmoid diverticulitis    HPI: The patient is a 50 year old gentleman referred to me by Dr. Orvis Brill for a colovesical fistula. He he says this started approximately 2-3 months ago. He has pneumaturia as well as passing particles in his urine. He is also had urinary tract infections. He notes discomfort. A CT scan does show an apparent connection between his bladder and colon. He is scheduled for a colovesical physical takedown early March with Dr. Orvis Brill.   PMH: Past Medical History  Diagnosis Date  . Arthritis   . GERD (gastroesophageal reflux disease)   . Generalized headaches   . Rectal pain   . Thrombosed hemorrhoids   . Diverticulitis     Surgical History: Past Surgical History  Procedure Laterality Date  . Cholecystectomy  1989  . Tonsillectomy  1970  . Appendectomy  1988    Home Medications:    Medication List       This list is accurate as of: 08/02/15  9:37 AM.  Always use your most recent med list.               ciprofloxacin 500 MG tablet  Commonly known as:  CIPRO  Take 1 tablet by mouth 2 (two) times daily.     FLAGYL 500 MG tablet  Generic drug:  metroNIDAZOLE  Take 1 tablet by mouth 3 (three) times daily.     HYDROcodone-acetaminophen 5-325 MG tablet  Commonly known as:  NORCO/VICODIN  Take 1 tablet by mouth every 6 (six) hours as needed for moderate pain.     hyoscyamine 0.125 MG tablet  Commonly known as:  LEVSIN, ANASPAZ  Take by mouth. Reported on 08/02/2015     varenicline 0.5 MG X 11 & 1 MG X 42 tablet  Commonly known as:  CHANTIX PAK  Take one 0.5 mg tablet by mouth once daily for 3 days, then increase to one 0.5 mg tablet twice daily for 4 days, then increase to one 1 mg tablet twice daily.         Allergies: No Known Allergies  Family History: Family History  Problem Relation Age of Onset  . Cholecystitis Mother   . Lung cancer Father   . Emphysema Paternal Grandmother   . Diabetes Paternal Grandmother   . Prostate cancer Maternal Uncle     Social History:  reports that he has been smoking Cigarettes.  He has been smoking about 1.00 pack per day. He has never used smokeless tobacco. He reports that he does not drink alcohol or use illicit drugs.  ROS: UROLOGY Frequent Urination?: No Hard to postpone urination?: Yes Burning/pain with urination?: Yes Get up at night to urinate?: Yes Leakage of urine?: No Urine stream starts and stops?: No Trouble starting stream?: Yes Do you have to strain to urinate?: No Blood in urine?: Yes Urinary tract infection?: Yes Sexually transmitted disease?: No Injury to kidneys or bladder?: No Painful intercourse?: No Weak stream?: No Erection problems?: No Penile pain?: No  Gastrointestinal Nausea?: Yes Vomiting?: Yes Indigestion/heartburn?: Yes Diarrhea?: Yes Constipation?: No  Constitutional Fever: Yes Night sweats?: No Weight loss?: No Fatigue?: Yes  Skin Skin rash/lesions?: Yes Itching?: Yes  Eyes Blurred vision?: No Double vision?: No  Ears/Nose/Throat Sore throat?: No Sinus problems?: No  Hematologic/Lymphatic Swollen glands?: No Easy bruising?: No  Cardiovascular Leg swelling?: No Chest pain?: Yes  Respiratory Cough?: Yes Shortness of breath?: Yes  Endocrine Excessive thirst?: No  Musculoskeletal Back pain?: Yes Joint pain?: Yes  Neurological Headaches?: No Dizziness?: No  Psychologic Depression?: Yes Anxiety?: No  Physical Exam: BP 122/78 mmHg  Pulse 85  Ht  (1.753 m)  Wt 217 lb 8 oz (98.657 kg)  BMI 32.10 kg/m2  Constitutional:  Alert and oriented, No acute distress. HEENT: [place] AT, moist mucus membranes.  Trachea midline, no masses. Cardiovascular: No clubbing, cyanosis,  or edema. Respiratory: Normal respiratory effort, no increased work of breathing. GI: Abdomen is soft, nontender, nondistended, no abdominal masses GU: No CVA tenderness.  Skin: No rashes, bruises or suspicious lesions. Lymph: No cervical or inguinal adenopathy. Neurologic: Grossly intact, no focal deficits, moving all 4 extremities. Psychiatric: Normal mood and affect.  Laboratory Data: Lab Results  Component Value Date   WBC 6.3 12/31/2014   HGB 14.7 12/31/2014   HCT 44.2 12/31/2014   MCV 91.6 12/31/2014   PLT 246 12/31/2014    Lab Results  Component Value Date   CREATININE 1.00 12/31/2014    No results found for: PSA  No results found for: TESTOSTERONE  No results found for: HGBA1C  Urinalysis    Component Value Date/Time   COLORURINE YELLOW* 12/31/2014 1003   COLORURINE Yellow 09/09/2011 1744   APPEARANCEUR CLEAR* 12/31/2014 1003   APPEARANCEUR Clear 09/09/2011 1744   LABSPEC 1.020 12/31/2014 1003   LABSPEC 1.008 09/09/2011 1744   PHURINE 7.0 12/31/2014 1003   PHURINE 6.0 09/09/2011 1744   GLUCOSEU NEGATIVE 12/31/2014 1003   GLUCOSEU Negative 09/09/2011 1744   HGBUR NEGATIVE 12/31/2014 1003   HGBUR Negative 09/09/2011 1744   BILIRUBINUR NEGATIVE 12/31/2014 1003   BILIRUBINUR Negative 09/09/2011 1744   KETONESUR NEGATIVE 12/31/2014 1003   KETONESUR Negative 09/09/2011 1744   PROTEINUR NEGATIVE 12/31/2014 1003   PROTEINUR Negative 09/09/2011 1744   NITRITE NEGATIVE 12/31/2014 1003   NITRITE Negative 09/09/2011 1744   LEUKOCYTESUR TRACE* 12/31/2014 1003   LEUKOCYTESUR Negative 09/09/2011 1744    Pertinent Imaging: CLINICAL DATA: Pelvic pain for several months. Nausea, vomiting and diarrhea.  EXAM: CT ABDOMEN AND PELVIS WITH CONTRAST  TECHNIQUE: Multidetector CT imaging of the abdomen and pelvis was performed using the standard protocol following bolus administration of intravenous contrast.  CONTRAST: OMNIPAQUE IOHEXOL 300 MG/ML  SOLN  COMPARISON: CT scan of September 12, 2011.  FINDINGS: Severe degenerative disc disease is noted at T11-12 and L1-2. Visualized lung bases are unremarkable.  Status post cholecystectomy. The liver and pancreas appear normal. Splenic granulomata are noted. Adrenal glands and kidneys appear normal. No hydronephrosis or renal obstruction is noted. Atherosclerosis of abdominal aorta is noted without aneurysm formation. There is no evidence of bowel obstruction. There appears to be sigmoid diverticulitis with gas collection between the sigmoid colon and superior wall of urinary bladder, which most likely represents contained perforation. There is severe focal wall thickening of the adjacent wall of the urinary bladder which is secondary to previously described inflammation. No significant adenopathy is noted.  IMPRESSION: Atherosclerosis of abdominal aorta without aneurysm formation.  Findings are concerning for sigmoid diverticulitis with large air collection seen between sigmoid colon and urinary bladder most consistent with contained perforation. There is also noted severe wall thickening of the superior portion of the urinary bladder wall related to this inflammation, and the possibility of fistula formation cannot be excluded. These results will be  called to the ordering clinician or representative by the Radiologist Assistant, and communication documented in the PACS or zVision Dashboard.  Assessment & Plan:    The patient is scheduled to go under a colovesical fistula takedown with Dr. Orvis Brill in early March. I discussed with the patient will my role would be in this procedure. We discussed in great detail cystoscopy, bilateral stent placement, and bladder closure. He understands that he will need a Foley catheter for 14 days with a cystogram prior. He also understands the risks of surgery which include but are not limited to bleeding, infection, DVT, injury to surrounding  structures, and prolonged catheterization. He is agreeable to proceeding with this procedure. I will be available to assist Dr. Orvis Brill at the time of the scheduled procedure in early March.  1. Colovesical fistula -Cystoscopy, bilateral ureteral stent placement, bladder repair with Dr. Orvis Brill at the time of colovesical fistula takedown    Hildred Laser, MD  [facility] [address], [address] [place], [place] [address] [phone]

## 2015-08-06 ENCOUNTER — Encounter: Payer: Self-pay | Admitting: Unknown Physician Specialty

## 2015-08-09 ENCOUNTER — Encounter: Payer: Self-pay | Admitting: *Deleted

## 2015-08-13 NOTE — Discharge Instructions (Signed)

## 2015-08-16 ENCOUNTER — Other Ambulatory Visit: Payer: Self-pay | Admitting: Gastroenterology

## 2015-08-16 ENCOUNTER — Encounter
Admission: RE | Disposition: A | Discharge status: Home / Self Care | Payer: Self-pay | Source: Ambulatory Visit | Attending: Gastroenterology

## 2015-08-16 ENCOUNTER — Ambulatory Visit
Admission: RE | Admit: 2015-08-16 | Discharge: 2015-08-16 | Disposition: A | Discharge status: Home / Self Care | Payer: Managed Care, Other (non HMO) | Source: Ambulatory Visit | Attending: Gastroenterology | Admitting: Gastroenterology

## 2015-08-16 ENCOUNTER — Ambulatory Visit: Payer: Managed Care, Other (non HMO) | Admitting: Anesthesiology

## 2015-08-16 DIAGNOSIS — Z801 Family history of malignant neoplasm of trachea, bronchus and lung: Secondary | ICD-10-CM | POA: Diagnosis not present

## 2015-08-16 DIAGNOSIS — M19011 Primary osteoarthritis, right shoulder: Secondary | ICD-10-CM | POA: Diagnosis not present

## 2015-08-16 DIAGNOSIS — K56699 Other intestinal obstruction unspecified as to partial versus complete obstruction: Secondary | ICD-10-CM | POA: Insufficient documentation

## 2015-08-16 DIAGNOSIS — Z9049 Acquired absence of other specified parts of digestive tract: Secondary | ICD-10-CM | POA: Diagnosis not present

## 2015-08-16 DIAGNOSIS — Z79899 Other long term (current) drug therapy: Secondary | ICD-10-CM | POA: Diagnosis not present

## 2015-08-16 DIAGNOSIS — M479 Spondylosis, unspecified: Secondary | ICD-10-CM | POA: Insufficient documentation

## 2015-08-16 DIAGNOSIS — J449 Chronic obstructive pulmonary disease, unspecified: Secondary | ICD-10-CM | POA: Insufficient documentation

## 2015-08-16 DIAGNOSIS — K219 Gastro-esophageal reflux disease without esophagitis: Secondary | ICD-10-CM | POA: Insufficient documentation

## 2015-08-16 DIAGNOSIS — Z8042 Family history of malignant neoplasm of prostate: Secondary | ICD-10-CM | POA: Insufficient documentation

## 2015-08-16 DIAGNOSIS — K5732 Diverticulitis of large intestine without perforation or abscess without bleeding: Secondary | ICD-10-CM | POA: Diagnosis not present

## 2015-08-16 DIAGNOSIS — Z8379 Family history of other diseases of the digestive system: Secondary | ICD-10-CM | POA: Diagnosis not present

## 2015-08-16 DIAGNOSIS — F17210 Nicotine dependence, cigarettes, uncomplicated: Secondary | ICD-10-CM | POA: Insufficient documentation

## 2015-08-16 DIAGNOSIS — R51 Headache: Secondary | ICD-10-CM | POA: Diagnosis not present

## 2015-08-16 DIAGNOSIS — K621 Rectal polyp: Secondary | ICD-10-CM | POA: Diagnosis not present

## 2015-08-16 DIAGNOSIS — M19012 Primary osteoarthritis, left shoulder: Secondary | ICD-10-CM | POA: Diagnosis not present

## 2015-08-16 DIAGNOSIS — Z825 Family history of asthma and other chronic lower respiratory diseases: Secondary | ICD-10-CM | POA: Diagnosis not present

## 2015-08-16 DIAGNOSIS — K5669 Other intestinal obstruction: Secondary | ICD-10-CM | POA: Diagnosis not present

## 2015-08-16 DIAGNOSIS — K5730 Diverticulosis of large intestine without perforation or abscess without bleeding: Secondary | ICD-10-CM | POA: Insufficient documentation

## 2015-08-16 DIAGNOSIS — Z833 Family history of diabetes mellitus: Secondary | ICD-10-CM | POA: Diagnosis not present

## 2015-08-16 DIAGNOSIS — K5792 Diverticulitis of intestine, part unspecified, without perforation or abscess without bleeding: Secondary | ICD-10-CM | POA: Diagnosis present

## 2015-08-16 HISTORY — DX: Chronic obstructive pulmonary disease, unspecified: J44.9

## 2015-08-16 HISTORY — PX: POLYPECTOMY: SHX5525

## 2015-08-16 HISTORY — PX: COLONOSCOPY WITH PROPOFOL: SHX5780

## 2015-08-16 SURGERY — COLONOSCOPY WITH PROPOFOL
Anesthesia: Monitor Anesthesia Care

## 2015-08-16 MED ORDER — ONDANSETRON HCL 4 MG/2ML IJ SOLN
INTRAMUSCULAR | Status: DC | PRN
  Administered 2015-08-16: 4 mg via INTRAVENOUS

## 2015-08-16 MED ORDER — STERILE WATER FOR IRRIGATION IR SOLN
Status: DC | PRN
  Administered 2015-08-16: 10:00:00

## 2015-08-16 MED ORDER — GLYCOPYRROLATE 0.2 MG/ML IJ SOLN
INTRAMUSCULAR | Status: DC | PRN
  Administered 2015-08-16: 0.1 mg via INTRAVENOUS

## 2015-08-16 MED ORDER — PROPOFOL 10 MG/ML IV BOLUS
INTRAVENOUS | Status: DC | PRN
  Administered 2015-08-16: 150 mg via INTRAVENOUS

## 2015-08-16 MED ORDER — LACTATED RINGERS IV SOLN
INTRAVENOUS | Status: DC
  Administered 2015-08-16: 10:00:00 via INTRAVENOUS

## 2015-08-16 MED ORDER — LIDOCAINE HCL (CARDIAC) 20 MG/ML IV SOLN
INTRAVENOUS | Status: DC | PRN
  Administered 2015-08-16: 50 mg via INTRAVENOUS

## 2015-08-16 SURGICAL SUPPLY — 28 items
CANISTER SUCT 1200ML W/VALVE (MISCELLANEOUS) ×3 IMPLANT
FCP ESCP3.2XJMB 240X2.8X (MISCELLANEOUS)
FORCEPS BIOP RAD 4 LRG CAP 4 (CUTTING FORCEPS) IMPLANT
FORCEPS BIOP RJ4 240 W/NDL (MISCELLANEOUS)
FORCEPS ESCP3.2XJMB 240X2.8X (MISCELLANEOUS) IMPLANT
GOWN CVR UNV OPN BCK APRN NK (MISCELLANEOUS) ×4 IMPLANT
GOWN ISOL THUMB LOOP REG UNIV (MISCELLANEOUS) ×2
HEMOCLIP INSTINCT (CLIP) IMPLANT
INJECTOR VARIJECT VIN23 (MISCELLANEOUS) IMPLANT
KIT CO2 TUBING (TUBING) IMPLANT
KIT DEFENDO VALVE AND CONN (KITS) IMPLANT
KIT ENDO PROCEDURE OLY (KITS) ×3 IMPLANT
LIGATOR MULTIBAND 6SHOOTER MBL (MISCELLANEOUS) IMPLANT
MARKER SPOT ENDO TATTOO 5ML (MISCELLANEOUS) IMPLANT
PAD GROUND ADULT SPLIT (MISCELLANEOUS) IMPLANT
SNARE SHORT THROW 13M SML OVAL (MISCELLANEOUS) IMPLANT
SNARE SHORT THROW 30M LRG OVAL (MISCELLANEOUS) IMPLANT
SPOT EX ENDOSCOPIC TATTOO (MISCELLANEOUS)
SUCTION POLY TRAP 4CHAMBER (MISCELLANEOUS) IMPLANT
TRAP SUCTION POLY (MISCELLANEOUS) IMPLANT
TUBING CONN 6MMX3.1M (TUBING)
TUBING SUCTION CONN 0.25 STRL (TUBING) IMPLANT
UNDERPAD 30X60 958B10 (PK) (MISCELLANEOUS) IMPLANT
VALVE BIOPSY ENDO (VALVE) IMPLANT
VARIJECT INJECTOR VIN23 (MISCELLANEOUS)
WATER AUXILLARY (MISCELLANEOUS) IMPLANT
WATER STERILE IRR 250ML POUR (IV SOLUTION) ×3 IMPLANT
WATER STERILE IRR 500ML POUR (IV SOLUTION) IMPLANT

## 2015-08-16 NOTE — Op Note (Signed)
[facility] Patient Name: Peter [name] Procedure Date: 08/16/2015 10:05 AM MRN: [id number] Account #: [id number] Date of Birth: 1966-06-04 Admit Type: Outpatient Age: 50 Room: [facility] OR ROOM 01 Gender: Male Note Status: Finalized Procedure:         Colonoscopy Indications:       Follow-up of diverticulitis Providers:         Midge Minium, MD Referring MD:      Gabriel Cirri, PA (Referring MD) Medicines:         Propofol per Anesthesia Complications:     No immediate complications. Procedure:         Pre-Anesthesia Assessment:                    - Prior to the procedure, a History and Physical was                     performed, and patient medications and allergies were                     reviewed. The patient's tolerance of previous anesthesia                     was also reviewed. The risks and benefits of the procedure                     and the sedation options and risks were discussed with the                     patient. All questions were answered, and informed consent                     was obtained. Prior Anticoagulants: The patient has taken                     no previous anticoagulant or antiplatelet agents. ASA                     Grade Assessment: II - A patient with mild systemic                     disease. After reviewing the risks and benefits, the                     patient was deemed in satisfactory condition to undergo                     the procedure.                    After obtaining informed consent, the colonoscope was                     passed under direct vision. Throughout the procedure, the                     patient's blood pressure, pulse, and oxygen saturations                     were monitored continuously. The Olympus CF H180AL                     colonoscope (S#: [id number]) was introduced through the anus  and advanced to the the sigmoid colon. The colonoscopy was   performed without difficulty. The patient tolerated the                     procedure well. The quality of the bowel preparation was                     excellent. Findings:      The perianal and digital rectal examinations were normal.      A 8 mm polyp was found in the rectum. The polyp was sessile. The polyp       was removed with a cold biopsy forceps. Resection and retrieval were       complete.      A benign-appearing, intrinsic severe stenosis was found in the sigmoid       colon and was non-traversed.      Multiple small-mouthed diverticula were found in the sigmoid colon. Impression:        - One 8 mm polyp in the rectum. Resected and retrieved.                    - Stricture in the sigmoid colon.                    - Diverticulosis in the sigmoid colon. Recommendation:    - Await pathology results.                    - Return to Dr. Orvis Brill. Procedure Code(s): --- Professional ---                    [phone], Sigmoidoscopy, flexible; with biopsy, single or                     multiple Diagnosis Code(s): --- Professional ---                    K57.32, Diverticulitis of large intestine without                     perforation or abscess without bleeding                    K62.1, Rectal polyp                    K56.69, Other intestinal obstruction CPT copyright 2014 American Medical Association. All rights reserved. The codes documented in this report are preliminary and upon coder review may  be revised to meet current compliance requirements. Midge Minium, MD 08/16/2015 10:26:26 AM This report has been signed electronically. Number of Addenda: 0 Note Initiated On: 08/16/2015 10:05 AM Total Procedure Duration: 0 hours 13 minutes 2 seconds       [facility]

## 2015-08-16 NOTE — Anesthesia Preprocedure Evaluation (Signed)
Anesthesia Evaluation  Patient identified by MRN, date of birth, ID band Patient awake    Reviewed: Allergy & Precautions, NPO status , Patient's Chart, lab work & pertinent test results, reviewed documented beta blocker date and time   Airway Mallampati: II  TM Distance: >3 FB Neck ROM: Full    Dental no notable dental hx.    Pulmonary COPD, Current Smoker,    Pulmonary exam normal        Cardiovascular negative cardio ROS Normal cardiovascular exam     Neuro/Psych  Headaches,    GI/Hepatic Neg liver ROS, GERD  Medicated and Controlled,  Endo/Other  negative endocrine ROS  Renal/GU negative Renal ROS     Musculoskeletal  (+) Arthritis , Osteoarthritis,    Abdominal   Peds  Hematology negative hematology ROS (+)   Anesthesia Other Findings   Reproductive/Obstetrics                             Anesthesia Physical Anesthesia Plan  ASA: II  Anesthesia Plan: MAC   Post-op Pain Management:    Induction: Intravenous  Airway Management Planned:   Additional Equipment:   Intra-op Plan:   Post-operative Plan:   Informed Consent: I have reviewed the patients History and Physical, chart, labs and discussed the procedure including the risks, benefits and alternatives for the proposed anesthesia with the patient or authorized representative who has indicated his/her understanding and acceptance.     Plan Discussed with: CRNA  Anesthesia Plan Comments:         Anesthesia Quick Evaluation

## 2015-08-16 NOTE — Anesthesia Postprocedure Evaluation (Signed)
Anesthesia Post Note  Patient: Peter [name]  Procedure(s) Performed: Procedure(s) (LRB): COLONOSCOPY WITH PROPOFOL (N/A) POLYPECTOMY  Patient location during evaluation: PACU Anesthesia Type: MAC Level of consciousness: awake and alert and oriented Pain management: pain level controlled Vital Signs Assessment: post-procedure vital signs reviewed and stable Respiratory status: spontaneous breathing and nonlabored ventilation Cardiovascular status: stable Postop Assessment: no signs of nausea or vomiting and adequate PO intake Anesthetic complications: no    Harolyn Rutherford

## 2015-08-16 NOTE — Anesthesia Procedure Notes (Signed)
Procedure Name: MAC Date/Time: 08/16/2015 10:07 AM Performed by: Maree Krabbe Pre-anesthesia Checklist: Patient identified, Emergency Drugs available, Suction available, Timeout performed and Patient being monitored Patient Re-evaluated:Patient Re-evaluated prior to inductionOxygen Delivery Method: Nasal cannula Placement Confirmation: positive ETCO2

## 2015-08-16 NOTE — H&P (Signed)
  [facility]  [address] [place], [place] [address] Phone: [phone] Fax : [phone]  Primary Care Physician:  Gabriel Cirri, NP Primary Gastroenterologist:  Dr. Servando Snare  Pre-Procedure History & Physical: HPI:  Peter [name] is a 50 y.o. male is here for an colonoscopy.   Past Medical History  Diagnosis Date  . GERD (gastroesophageal reflux disease)   . Generalized headaches   . Rectal pain   . Thrombosed hemorrhoids   . Diverticulitis   . COPD (chronic obstructive pulmonary disease) (HCC)   . Arthritis     neck, shoulders    Past Surgical History  Procedure Laterality Date  . Cholecystectomy  1989  . Tonsillectomy  1970  . Appendectomy  1988    Prior to Admission medications   Medication Sig Start Date End Date Taking? Authorizing Provider  omeprazole (PRILOSEC) 20 MG capsule Take 20 mg by mouth daily as needed.   Yes Historical Provider, MD  HYDROcodone-acetaminophen (NORCO/VICODIN) 5-325 MG per tablet Take 1 tablet by mouth every 6 (six) hours as needed for moderate pain. 12/31/14   Governor Rooks, MD  varenicline (CHANTIX PAK) 0.5 MG X 11 & 1 MG X 42 tablet Take one 0.5 mg tablet by mouth once daily for 3 days, then increase to one 0.5 mg tablet twice daily for 4 days, then increase to one 1 mg tablet twice daily. Patient not taking: Reported on 08/02/2015 07/25/15   Gladis Riffle, MD    Allergies as of 07/25/2015  . (No Known Allergies)    Family History  Problem Relation Age of Onset  . Cholecystitis Mother   . Lung cancer Father   . Emphysema Paternal Grandmother   . Diabetes Paternal Grandmother   . Prostate cancer Maternal Uncle     Social History   Social History  . Marital Status: Single    Spouse Name: N/A  . Number of Children: N/A  . Years of Education: N/A   Occupational History  . Not on file.   Social History Main Topics  . Smoking status: Current Every Day Smoker -- 1.00 packs/day for 30 years    Types:  Cigarettes  . Smokeless tobacco: Never Used  . Alcohol Use: No  . Drug Use: No  . Sexual Activity: Not on file   Other Topics Concern  . Not on file   Social History Narrative    Review of Systems: See HPI, otherwise negative ROS  Physical Exam: Ht  (1.753 m)  Wt 217 lb (98.431 kg)  BMI 32.03 kg/m2 General:   Alert,  pleasant and cooperative in NAD Head:  Normocephalic and atraumatic. Neck:  Supple; no masses or thyromegaly. Lungs:  Clear throughout to auscultation.    Heart:  Regular rate and rhythm. Abdomen:  Soft, nontender and nondistended. Normal bowel sounds, without guarding, and without rebound.   Neurologic:  Alert and  oriented x4;  grossly normal neurologically.  Impression/Plan: Peter [name] is here for an colonoscopy to be performed for diverticultis  Risks, benefits, limitations, and alternatives regarding  colonoscopy have been reviewed with the patient.  Questions have been answered.  All parties agreeable.   Darlina Rumpf, MD  08/16/2015, 9:25 AM

## 2015-08-16 NOTE — Transfer of Care (Signed)
Immediate Anesthesia Transfer of Care Note  Patient: Peter [name]  Procedure(s) Performed: Procedure(s): COLONOSCOPY WITH PROPOFOL (N/A)  Patient Location: PACU  Anesthesia Type: MAC  Level of Consciousness: awake, alert  and patient cooperative  Airway and Oxygen Therapy: Patient Spontanous Breathing and Patient connected to supplemental oxygen  Post-op Assessment: Post-op Vital signs reviewed, Patient's Cardiovascular Status Stable, Respiratory Function Stable, Patent Airway and No signs of Nausea or vomiting  Post-op Vital Signs: Reviewed and stable  Complications: No apparent anesthesia complications

## 2015-08-19 ENCOUNTER — Encounter: Payer: Self-pay | Admitting: Gastroenterology

## 2015-08-20 ENCOUNTER — Encounter: Payer: Self-pay | Admitting: Surgery

## 2015-08-20 ENCOUNTER — Ambulatory Visit (INDEPENDENT_AMBULATORY_CARE_PROVIDER_SITE_OTHER): Payer: Managed Care, Other (non HMO) | Admitting: Surgery

## 2015-08-20 VITALS — BP 143/82 | HR 90 | Temp 98.3°F | Wt 212.0 lb

## 2015-08-20 DIAGNOSIS — N321 Vesicointestinal fistula: Secondary | ICD-10-CM

## 2015-08-20 DIAGNOSIS — K5669 Other intestinal obstruction: Secondary | ICD-10-CM | POA: Diagnosis not present

## 2015-08-20 DIAGNOSIS — F17200 Nicotine dependence, unspecified, uncomplicated: Secondary | ICD-10-CM | POA: Diagnosis not present

## 2015-08-20 DIAGNOSIS — K5732 Diverticulitis of large intestine without perforation or abscess without bleeding: Secondary | ICD-10-CM | POA: Diagnosis not present

## 2015-08-20 DIAGNOSIS — K621 Rectal polyp: Secondary | ICD-10-CM | POA: Diagnosis not present

## 2015-08-20 DIAGNOSIS — K56699 Other intestinal obstruction unspecified as to partial versus complete obstruction: Secondary | ICD-10-CM

## 2015-08-20 MED ORDER — POLYETHYLENE GLYCOL POWD: 17.0000 g | Freq: Two times a day (BID) | Status: DC

## 2015-08-20 MED ORDER — VARENICLINE TARTRATE 0.5 MG X 11 & 1 MG X 42 PO MISC: ORAL | Status: DC

## 2015-08-20 NOTE — Progress Notes (Signed)
Subjective:     Patient ID: Peter [name], male   DOB: 12/23/1965, 50 y.o.   MRN: [id number]  HPI  50 year old male history of diverticulitis,and now large Colovesical fistula.  Patient states that he has had diverticulitis many times approximately 7 since about the year 2002. We discussed at the last visit that he would need a operation for this, he has seen the urologist Dr. Sherryl BartersBudzyn in the meantime, who is agreeable to help with stent placement and fistula repair.  Patient has also had colonoscopy with Dr. Servando SnareWohl on 2/10 which showed an 8 mm polyp in the rectum that was tubular adenoma and a fear stenosis of the sigmoid colon which could not be transversed. Patient also stated that along with the bowel prep for his colonoscopy began vomiting at the time of his colonoscopy and felt as if things were moving through as well. Patient states that he is having bowel movements daily but that the caliber of them are smaller. Patient does not feel back up or constipated.Patient denies any fever chills nausea or vomiting or abdominal pain at this time, but does endorse pneumaturia and dysuria.  Past Medical History  Diagnosis Date  . GERD (gastroesophageal reflux disease)   . Generalized headaches   . Rectal pain   . Thrombosed hemorrhoids   . Diverticulitis   . COPD (chronic obstructive pulmonary disease) (HCC)   . Arthritis     neck, shoulders   Past Surgical History  Procedure Laterality Date  . Cholecystectomy  1989  . Tonsillectomy  1970  . Appendectomy  1988  . Colonoscopy with propofol N/A 08/16/2015    Procedure: COLONOSCOPY WITH PROPOFOL;  Surgeon: Midge Miniumarren Wohl, MD;  [facility];  Service: Endoscopy;  Laterality: N/A;  . Polypectomy  08/16/2015    Procedure: POLYPECTOMY;  Surgeon: Midge Miniumarren Wohl, MD;  [facility];  Service: Endoscopy;;   Family History  Problem Relation Age of Onset  . Cholecystitis Mother   . Lung cancer Father   . Emphysema Paternal  Grandmother   . Diabetes Paternal Grandmother   . Prostate cancer Maternal Uncle    Social History   Social History  . Marital Status: Single    Spouse Name: N/A  . Number of Children: N/A  . Years of Education: N/A   Social History Main Topics  . Smoking status: Current Every Day Smoker -- 1.00 packs/day for 30 years    Types: Cigarettes  . Smokeless tobacco: Never Used  . Alcohol Use: No  . Drug Use: No  . Sexual Activity: Not Asked   Other Topics Concern  . None   Social History Narrative    Current outpatient prescriptions:  .  Polyethylene Glycol POWD, Take 17 g by mouth 2 (two) times daily., Disp: 527 g, Rfl: 5 .  varenicline (CHANTIX PAK) 0.5 MG X 11 & 1 MG X 42 tablet, Take one 0.5 mg tablet by mouth once daily for 3 days, then increase to one 0.5 mg tablet twice daily for 4 days, then increase to one 1 mg tablet twice daily., Disp: 53 tablet, Rfl: 0 No Known Allergies     Review of Systems  Constitutional: Negative for fever, chills, activity change, appetite change and fatigue.  HENT: Negative for congestion and sore throat.   Respiratory: Negative for apnea, cough, shortness of breath and wheezing.   Cardiovascular: Negative for chest pain, palpitations and leg swelling.  Gastrointestinal: Negative for nausea, vomiting, abdominal pain, diarrhea, constipation, blood in  stool, abdominal distention and anal bleeding.  Genitourinary: Positive for dysuria and hematuria.  Musculoskeletal: Negative for back pain.  Skin: Negative for color change, pallor, rash and wound.  Neurological: Negative for dizziness and weakness.  Hematological: Negative for adenopathy. Does not bruise/bleed easily.  Psychiatric/Behavioral: Negative for agitation. The patient is not nervous/anxious.   All other systems reviewed and are negative.  Filed Vitals:   08/20/15 0805  BP: 143/82  Pulse: 90  Temp: 98.3 F (36.8 C)       Objective:   Physical Exam  Constitutional: He is  oriented to person, place, and time. He appears well-developed and well-nourished. No distress.  HENT:  Head: Normocephalic and atraumatic.  Nose: Nose normal.  Mouth/Throat: Oropharynx is clear and moist. No oropharyngeal exudate.  Eyes: Conjunctivae are normal. Pupils are equal, round, and reactive to light. No scleral icterus.  Neck: Normal range of motion. Neck supple. No tracheal deviation present.  Cardiovascular: Normal rate, regular rhythm, normal heart sounds and intact distal pulses.  Exam reveals no gallop and no friction rub.   No murmur heard. Pulmonary/Chest: Effort normal and breath sounds normal. No respiratory distress. He has no wheezes.  Abdominal: Soft. Bowel sounds are normal. He exhibits no distension. There is no tenderness. There is no rebound.  Well healed previous surgical scars  Musculoskeletal: Normal range of motion. He exhibits no edema or tenderness.  Neurological: He is alert and oriented to person, place, and time. No cranial nerve deficit.  Skin: Skin is warm and dry. No rash noted. No erythema. No pallor.  Psychiatric: He has a normal mood and affect. His behavior is normal. Judgment and thought content normal.  Vitals reviewed.  Pathology report: 8 mm rectal polyp Tubular adenoma, no high grade dysplasia or malignancy    Assessment:     50 yr old with Colovesical fistula and sigmoid stenosis from Diverticulitis     Plan:     I have personally reviewed his past medical history, including his past bouts of diverticulitis, as well as his visit with Dr. Renda Rolls and his primary care provider regarding his diverticulitis. I have personally reviewed his laboratory values, Showing a positive UA with Escherichia coli in his urine , however no elevated white blood cell count. I have personally reviewed his CT scan of his abdomen and pelvis that was performed that showed thickening and inflammation of his sigmoid colon and a connection between the sigmoid  colon and the bladder with inflammation surrounding a portion of the bladder. I have also reviewed his radiology reads.  I have reviewed his notes from the urologist Dr. Sherryl Barters and the gastroenterologist Dr. Servando Snare, as well as the colonoscopy procedure report showing small polyp and sigmoid colonic stenosis. I have also reviewed the pathology as above of tubular adenoma.   I discussed the risk benefits alternatives and treatment options with the patient and his friend to include Laparoscopic resection of the colon and repair of his colovesical fistula possible open, possible ostomy placement. I discussed with the patient that the problem would not improve over time and with antibiotics could only temporize his symptoms. I discussed with them that the risk of surgery were bleeding, infection, anastomotic leak, abscess formation, need for drain placement, damage to the ureters, damage to bladder, need for prolonged catheter after surgery, possible need to convert to open procedure, possible need for ostomy placement, possible need for blood products during surgery, possible need for reoperation; as well as anesthetic risk of heart attack  stroke and blood clots and death. I also discussed with him that his length of hospital stay will be anywhere between 5-10 days depending on operation and if any complications arise and that he would need a Foley catheter for at least a week to 14 days after the procedure. 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   I again discussed his tobacco use , and recommended that he stop smoking. I discussed with him different options such as nicotine,, nicotine patches, Wellbutrin or Chantix to help him quit. Patient stated that he wanted to start the Chantix but that the pharmacy did not have the prescription from the last  visit where it was e-prescribed.  I apologized for this and gave him a new prescription for the Chantix this visit. I discussed with him that he will start taking the Chantix and within 7-10 days should pick a date to stop smoking at that time. Also discussed with him that he should use nicotine gum to help with the acute cravings during that time as well.  I spent 10 mins discussing smoking cessation alone.  I spent a total of 60 minutes with over 50% of that time with the patient in counseling, education, planning and coordination of care.

## 2015-08-21 ENCOUNTER — Telehealth: Payer: Self-pay | Admitting: Surgery

## 2015-08-21 NOTE — Telephone Encounter (Signed)
Pt advised of pre op date/time and sx date. Sx: 09/11/15 with Dr Loflin--Dr Thelma Barge assisting--Dr Budzin assisting--Laparoscopic partial colectomy with colovesical fistula repair. Pre op: 09/02/15 @ 9:00 am--office.

## 2015-08-22 ENCOUNTER — Encounter: Payer: Self-pay | Admitting: Gastroenterology

## 2015-09-02 ENCOUNTER — Encounter
Admission: RE | Admit: 2015-09-02 | Discharge: 2015-09-02 | Disposition: A | Discharge status: Home / Self Care | Payer: Managed Care, Other (non HMO) | Source: Ambulatory Visit | Attending: Surgery | Admitting: Surgery

## 2015-09-02 ENCOUNTER — Ambulatory Visit
Admission: RE | Admit: 2015-09-02 | Discharge: 2015-09-02 | Disposition: A | Discharge status: Home / Self Care | Payer: Managed Care, Other (non HMO) | Source: Ambulatory Visit | Attending: Surgery | Admitting: Surgery

## 2015-09-02 DIAGNOSIS — Z01818 Encounter for other preprocedural examination: Secondary | ICD-10-CM | POA: Diagnosis present

## 2015-09-02 DIAGNOSIS — K6389 Other specified diseases of intestine: Secondary | ICD-10-CM | POA: Insufficient documentation

## 2015-09-02 LAB — COMPREHENSIVE METABOLIC PANEL
ALBUMIN: 4.3 g/dL (ref 3.5–5.0)
ALT: 13 U/L — ABNORMAL LOW (ref 17–63)
ANION GAP: 7 (ref 5–15)
AST: 17 U/L (ref 15–41)
Alkaline Phosphatase: 56 U/L (ref 38–126)
BUN: 12 mg/dL (ref 6–20)
CHLORIDE: 104 mmol/L (ref 101–111)
CO2: 30 mmol/L (ref 22–32)
Calcium: 9.5 mg/dL (ref 8.9–10.3)
Creatinine, Ser: 0.97 mg/dL (ref 0.61–1.24)
GFR calc Af Amer: >60 mL/min (ref >60)
Glucose, Bld: 94 mg/dL (ref 65–99)
POTASSIUM: 4.9 mmol/L (ref 3.5–5.1)
Sodium: 141 mmol/L (ref 135–145)
TOTAL PROTEIN: 8 g/dL (ref 6.5–8.1)
Total Bilirubin: 0.6 mg/dL (ref 0.3–1.2)

## 2015-09-02 LAB — CBC WITH DIFFERENTIAL/PLATELET
BASOS ABS: 0.1 10*3/uL (ref 0–0.1)
BASOS PCT: 1 %
EOS PCT: 5 %
Eosinophils Absolute: 0.2 10*3/uL (ref 0–0.7)
HCT: 47.5 % (ref 40.0–52.0)
Hemoglobin: 16.4 g/dL (ref 13.0–18.0)
Lymphocytes Relative: 27 %
Lymphs Abs: 1.2 10*3/uL (ref 1.0–3.6)
MCH: 30.6 pg (ref 26.0–34.0)
MCHC: 34.5 g/dL (ref 32.0–36.0)
MCV: 88.6 fL (ref 80.0–100.0)
MONO ABS: 0.4 10*3/uL (ref 0.2–1.0)
MONOS PCT: 9 %
Neutro Abs: 2.7 10*3/uL (ref 1.4–6.5)
Neutrophils Relative %: 58 %
PLATELETS: 211 10*3/uL (ref 150–440)
RBC: 5.36 MIL/uL (ref 4.40–5.90)
RDW: 14.2 % (ref 11.5–14.5)
WBC: 4.6 10*3/uL (ref 3.8–10.6)

## 2015-09-02 LAB — SURGICAL PCR SCREEN
MRSA, PCR: POSITIVE — AB
STAPHYLOCOCCUS AUREUS: POSITIVE — AB

## 2015-09-02 NOTE — Patient Instructions (Addendum)
  Your procedure is scheduled on: 09/11/15 Report to Day Surgery. [facility]OND FLOOR To find out your arrival time please call [phone] between 1PM - 3PM on 09/10/15.  Remember: Instructions that are not followed completely may result in serious medical risk, up to and including death, or upon the discretion of your surgeon and anesthesiologist your surgery may need to be rescheduled.    _X___ 1. Do not eat food or drink liquids after midnight. No gum chewing or hard candies.     __X__ 2. No Alcohol for 24 hours before or after surgery.   ____ 3. Bring all medications with you on the day of surgery if instructed.   _X_ 4. Notify your doctor if there is any change in your medical condition     (cold, fever, infections).     Do not wear jewelry, make-up, hairpins, clips or nail polish.  Do not wear lotions, powders, or perfumes. You may wear deodorant.  Do not shave 48 hours prior to surgery. Men may shave face and neck.  Do not bring valuables to the hospital.    [facility] is not responsible for any belongings or valuables.               Contacts, dentures or bridgework may not be worn into surgery.  Leave your suitcase in the car. After surgery it may be brought to your room.  For patients admitted to the hospital, discharge time is determined by your                treatment team.   Patients discharged the day of surgery will not be allowed to drive home.   Please read over the following fact sheets that you were given:   Surgical Site Infection Prevention   ____ Take these medicines the morning of surgery with A SIP OF WATER:    1. NONE  2.   3.   4.  5.  6.  __X__ Fleet Enema (as directed) ONE HOUR BEFORE LEAVING HOME AM OF SURGERY  _X__ Use CHG Soap as directed  ____ Use inhalers on the day of surgery  ____ Stop metformin 2 days prior to surgery    ____ Take 1/2 of usual insulin dose the night before surgery and none on the morning of surgery.   ____  Stop Coumadin/Plavix/aspirin on   ____ Stop Anti-inflammatories on   ____ Stop supplements until after surgery.    ____ Bring C-Pap to the hospital.   TWO DAY BOWEL PREP AS DIRECTED BY SURGEON

## 2015-09-02 NOTE — Pre-Procedure Instructions (Signed)
CALLED AND FAXED POSITIVE MRSA AND STAPH TO [facility] AT DR Orvis Brill

## 2015-09-03 MED ORDER — ONDANSETRON HCL 4 MG/2ML IJ SOLN: 4.0000 mg | Freq: Once | INTRAMUSCULAR | Status: DC | PRN

## 2015-09-03 MED ORDER — ACETAMINOPHEN 325 MG PO TABS: 325.0000 mg | ORAL_TABLET | ORAL | Status: DC | PRN

## 2015-09-03 MED ORDER — ACETAMINOPHEN 160 MG/5ML PO SOLN: 325.0000 mg | ORAL | Status: DC | PRN

## 2015-09-05 ENCOUNTER — Other Ambulatory Visit: Payer: Self-pay | Admitting: Surgery

## 2015-09-05 DIAGNOSIS — K5732 Diverticulitis of large intestine without perforation or abscess without bleeding: Secondary | ICD-10-CM

## 2015-09-05 MED ORDER — POLYETHYLENE GLYCOL 3350 17 GM/SCOOP PO POWD: 1.0000 | Freq: Once | ORAL | Status: DC

## 2015-09-05 MED ORDER — POLYETHYLENE GLYCOL 3350 17 GM/SCOOP PO POWD: 0.5000 | Freq: Once | ORAL | Status: DC

## 2015-09-05 MED ORDER — BISACODYL EC 5 MG PO TBEC: DELAYED_RELEASE_TABLET | ORAL | Status: DC

## 2015-09-05 NOTE — Pre-Procedure Instructions (Signed)
Spoke with Hospital doctor at Dr. Elease Etienne office regarding pt's positive MRSA and Staph. Aureus results.  She will talk to Dr. Orvis Brill regarding treatment.

## 2015-09-09 ENCOUNTER — Other Ambulatory Visit: Payer: Self-pay

## 2015-09-09 ENCOUNTER — Telehealth: Payer: Self-pay

## 2015-09-09 MED ORDER — BACITRACIN ZINC 500 UNIT/GM EX OINT: TOPICAL_OINTMENT | Freq: Every day | CUTANEOUS | Status: DC

## 2015-09-09 MED ORDER — ERYTHROMYCIN BASE 500 MG PO TABS: 1000.0000 mg | ORAL_TABLET | Freq: Three times a day (TID) | ORAL | Status: DC

## 2015-09-09 MED ORDER — NEOMYCIN SULFATE 500 MG PO TABS: 1000.0000 mg | ORAL_TABLET | Freq: Three times a day (TID) | ORAL | Status: DC

## 2015-09-09 NOTE — Progress Notes (Unsigned)
Received a call from Pre-admission testing giving positive MRSA result for PCR testing. Spoke with Dr. Orvis BrillLoflin. She would like to order Bacitracin to each nare daily. This has been sent to pharmacy. Also saw where PO antibiotics have not been sent to pharmacy for colon prep, these orders were also sent to Pharmacy.

## 2015-09-09 NOTE — Telephone Encounter (Signed)
Called patient to explain that his antibiotics were sent and he stated that he was coming to the office to turn in his disability paperwork.  Once patient came in to the office I explained and gave him a sheet of instructions on how to take his medications. I read his instructions and he gave understanding. Patient stated that he will go to his pharmacy and pick up the rest of his prescriptions to start taking tomorrow. I told him that if he has any questions or concerns, to please give us a call.

## 2015-09-09 NOTE — Telephone Encounter (Signed)
Please see other note

## 2015-09-10 NOTE — OR Nursing (Signed)
Dr. Sherryl BartersBudzyn notified of + MRSA nasal swab left message to see if he wants to order Vancomycin in [facility] the [facility]

## 2015-09-11 ENCOUNTER — Inpatient Hospital Stay: Payer: Managed Care, Other (non HMO)

## 2015-09-11 ENCOUNTER — Inpatient Hospital Stay: Payer: Managed Care, Other (non HMO) | Admitting: Anesthesiology

## 2015-09-11 ENCOUNTER — Inpatient Hospital Stay
Admission: RE | Admit: 2015-09-11 | Discharge: 2015-09-15 | DRG: 655 | Disposition: A | Discharge status: Home / Self Care | Payer: Managed Care, Other (non HMO) | Source: Ambulatory Visit | Attending: Surgery | Admitting: Surgery

## 2015-09-11 ENCOUNTER — Encounter
Admission: RE | Disposition: A | Discharge status: Home / Self Care | Payer: Self-pay | Source: Ambulatory Visit | Attending: Surgery

## 2015-09-11 ENCOUNTER — Encounter: Payer: Self-pay | Admitting: *Deleted

## 2015-09-11 ENCOUNTER — Telehealth: Payer: Self-pay

## 2015-09-11 DIAGNOSIS — J449 Chronic obstructive pulmonary disease, unspecified: Secondary | ICD-10-CM | POA: Diagnosis present

## 2015-09-11 DIAGNOSIS — K219 Gastro-esophageal reflux disease without esophagitis: Secondary | ICD-10-CM | POA: Diagnosis present

## 2015-09-11 DIAGNOSIS — Z8042 Family history of malignant neoplasm of prostate: Secondary | ICD-10-CM

## 2015-09-11 DIAGNOSIS — N321 Vesicointestinal fistula: Secondary | ICD-10-CM | POA: Diagnosis present

## 2015-09-11 DIAGNOSIS — R3989 Other symptoms and signs involving the genitourinary system: Secondary | ICD-10-CM | POA: Diagnosis present

## 2015-09-11 DIAGNOSIS — Z9889 Other specified postprocedural states: Secondary | ICD-10-CM

## 2015-09-11 DIAGNOSIS — Z801 Family history of malignant neoplasm of trachea, bronchus and lung: Secondary | ICD-10-CM

## 2015-09-11 DIAGNOSIS — Z825 Family history of asthma and other chronic lower respiratory diseases: Secondary | ICD-10-CM

## 2015-09-11 DIAGNOSIS — M1990 Unspecified osteoarthritis, unspecified site: Secondary | ICD-10-CM | POA: Diagnosis present

## 2015-09-11 DIAGNOSIS — Z79899 Other long term (current) drug therapy: Secondary | ICD-10-CM | POA: Diagnosis not present

## 2015-09-11 DIAGNOSIS — F17210 Nicotine dependence, cigarettes, uncomplicated: Secondary | ICD-10-CM | POA: Diagnosis present

## 2015-09-11 DIAGNOSIS — Z833 Family history of diabetes mellitus: Secondary | ICD-10-CM

## 2015-09-11 DIAGNOSIS — Z9049 Acquired absence of other specified parts of digestive tract: Secondary | ICD-10-CM

## 2015-09-11 DIAGNOSIS — K632 Fistula of intestine: Secondary | ICD-10-CM

## 2015-09-11 HISTORY — PX: CYSTOSCOPY WITH STENT PLACEMENT: SHX5790

## 2015-09-11 HISTORY — PX: LAPAROSCOPIC PARTIAL COLECTOMY: SHX5907

## 2015-09-11 HISTORY — PX: BLADDER REPAIR: SHX6721

## 2015-09-11 SURGERY — LAPAROSCOPIC PARTIAL COLECTOMY
Anesthesia: General

## 2015-09-11 MED ORDER — CHLORHEXIDINE GLUCONATE 4 % EX LIQD: 1.0000 "application " | Freq: Once | CUTANEOUS | Status: DC

## 2015-09-11 MED ORDER — ONDANSETRON HCL 4 MG/2ML IJ SOLN: 4.0000 mg | Freq: Once | INTRAMUSCULAR | Status: DC | PRN

## 2015-09-11 MED ORDER — DEXAMETHASONE SODIUM PHOSPHATE 10 MG/ML IJ SOLN
INTRAMUSCULAR | Status: DC | PRN
  Administered 2015-09-11: 10 mg via INTRAVENOUS

## 2015-09-11 MED ORDER — CYCLOBENZAPRINE HCL 10 MG PO TABS
10.0000 mg | ORAL_TABLET | Freq: Three times a day (TID) | ORAL | Status: DC
  Administered 2015-09-11 – 2015-09-15 (×12): 10 mg via ORAL
  Filled 2015-09-11 (×12): qty 1

## 2015-09-11 MED ORDER — ENOXAPARIN SODIUM 40 MG/0.4ML ~~LOC~~ SOLN
40.0000 mg | Freq: Once | SUBCUTANEOUS | Status: AC
  Administered 2015-09-11: 40 mg via SUBCUTANEOUS
  Filled 2015-09-11: qty 0.4

## 2015-09-11 MED ORDER — SODIUM CHLORIDE 0.9 % IV SOLN
1.0000 g | INTRAVENOUS | Status: DC
  Filled 2015-09-11: qty 1

## 2015-09-11 MED ORDER — SODIUM CHLORIDE 0.9 % IR SOLN
Status: DC | PRN
  Administered 2015-09-11: 300 mL via INTRAVESICAL

## 2015-09-11 MED ORDER — ENOXAPARIN SODIUM 40 MG/0.4ML ~~LOC~~ SOLN
40.0000 mg | SUBCUTANEOUS | Status: DC
  Administered 2015-09-12 – 2015-09-15 (×4): 40 mg via SUBCUTANEOUS
  Filled 2015-09-11 (×4): qty 0.4

## 2015-09-11 MED ORDER — ACETAMINOPHEN 10 MG/ML IV SOLN
INTRAVENOUS | Status: DC | PRN
  Administered 2015-09-11: 1000 mg via INTRAVENOUS

## 2015-09-11 MED ORDER — GLYCOPYRROLATE 0.2 MG/ML IJ SOLN
INTRAMUSCULAR | Status: DC | PRN
  Administered 2015-09-11: .8 mg via INTRAVENOUS

## 2015-09-11 MED ORDER — VARENICLINE TARTRATE 1 MG PO TABS
1.0000 mg | ORAL_TABLET | Freq: Two times a day (BID) | ORAL | Status: DC
  Administered 2015-09-11 – 2015-09-15 (×8): 1 mg via ORAL
  Filled 2015-09-11 (×8): qty 1

## 2015-09-11 MED ORDER — METHYLENE BLUE 0.5 % INJ SOLN
INTRAVENOUS | Status: AC
  Filled 2015-09-11: qty 10

## 2015-09-11 MED ORDER — ROCURONIUM BROMIDE 100 MG/10ML IV SOLN
INTRAVENOUS | Status: DC | PRN
  Administered 2015-09-11: 20 mg via INTRAVENOUS
  Administered 2015-09-11 (×3): 10 mg via INTRAVENOUS
  Administered 2015-09-11: 20 mg via INTRAVENOUS
  Administered 2015-09-11: 30 mg via INTRAVENOUS
  Administered 2015-09-11: 10 mg via INTRAVENOUS
  Administered 2015-09-11: 20 mg via INTRAVENOUS
  Administered 2015-09-11: 50 mg via INTRAVENOUS

## 2015-09-11 MED ORDER — ONDANSETRON 8 MG PO TBDP: 4.0000 mg | ORAL_TABLET | Freq: Four times a day (QID) | ORAL | Status: DC | PRN

## 2015-09-11 MED ORDER — OXYCODONE HCL 5 MG PO TABS
10.0000 mg | ORAL_TABLET | ORAL | Status: DC | PRN
  Administered 2015-09-12: 10 mg via ORAL
  Administered 2015-09-12 – 2015-09-13 (×2): 15 mg via ORAL
  Administered 2015-09-14 (×2): 10 mg via ORAL
  Administered 2015-09-14: 15 mg via ORAL
  Administered 2015-09-14 (×2): 10 mg via ORAL
  Administered 2015-09-15: 15 mg via ORAL
  Filled 2015-09-11 (×2): qty 3
  Filled 2015-09-11: qty 2
  Filled 2015-09-11: qty 3
  Filled 2015-09-11 (×4): qty 2
  Filled 2015-09-11: qty 3

## 2015-09-11 MED ORDER — HYDROMORPHONE HCL 1 MG/ML IJ SOLN
1.0000 mg | INTRAMUSCULAR | Status: DC | PRN
  Administered 2015-09-11 (×2): 1 mg via INTRAVENOUS
  Filled 2015-09-11 (×2): qty 1

## 2015-09-11 MED ORDER — BUPIVACAINE HCL (PF) 0.25 % IJ SOLN
INTRAMUSCULAR | Status: AC
  Filled 2015-09-11: qty 30

## 2015-09-11 MED ORDER — NEOSTIGMINE METHYLSULFATE 10 MG/10ML IV SOLN
INTRAVENOUS | Status: DC | PRN
  Administered 2015-09-11: 5 mg via INTRAVENOUS

## 2015-09-11 MED ORDER — KETOROLAC TROMETHAMINE 30 MG/ML IJ SOLN
30.0000 mg | Freq: Four times a day (QID) | INTRAMUSCULAR | Status: DC
  Administered 2015-09-11 – 2015-09-15 (×16): 30 mg via INTRAVENOUS
  Filled 2015-09-11 (×16): qty 1

## 2015-09-11 MED ORDER — PANTOPRAZOLE SODIUM 40 MG IV SOLR
40.0000 mg | Freq: Every day | INTRAVENOUS | Status: DC
Start: 2015-09-11 — End: 2015-09-15
  Administered 2015-09-11 – 2015-09-14 (×4): 40 mg via INTRAVENOUS
  Filled 2015-09-11 (×4): qty 40

## 2015-09-11 MED ORDER — SODIUM CHLORIDE 0.9 % IV BOLUS (SEPSIS)
1000.0000 mL | INTRAVENOUS | Status: AC
  Administered 2015-09-11: 1000 mL via INTRAVENOUS

## 2015-09-11 MED ORDER — LACTATED RINGERS IV SOLN
INTRAVENOUS | Status: DC | PRN
  Administered 2015-09-11: 08:00:00 via INTRAVENOUS

## 2015-09-11 MED ORDER — FAMOTIDINE 20 MG PO TABS
20.0000 mg | ORAL_TABLET | Freq: Once | ORAL | Status: AC
  Administered 2015-09-11: 20 mg via ORAL

## 2015-09-11 MED ORDER — CETYLPYRIDINIUM CHLORIDE 0.05 % MT LIQD
7.0000 mL | Freq: Two times a day (BID) | OROMUCOSAL | Status: DC
  Administered 2015-09-11 – 2015-09-14 (×5): 7 mL via OROMUCOSAL

## 2015-09-11 MED ORDER — DIPHENHYDRAMINE HCL 12.5 MG/5ML PO ELIX: 12.5000 mg | ORAL_SOLUTION | Freq: Four times a day (QID) | ORAL | Status: DC | PRN

## 2015-09-11 MED ORDER — FLEET ENEMA 7-19 GM/118ML RE ENEM: 1.0000 | ENEMA | Freq: Once | RECTAL | Status: DC

## 2015-09-11 MED ORDER — HYDRALAZINE HCL 20 MG/ML IJ SOLN: 10.0000 mg | INTRAMUSCULAR | Status: DC | PRN

## 2015-09-11 MED ORDER — PNEUMOCOCCAL VAC POLYVALENT 25 MCG/0.5ML IJ INJ
0.5000 mL | INJECTION | INTRAMUSCULAR | Status: AC
  Administered 2015-09-12: 0.5 mL via INTRAMUSCULAR
  Filled 2015-09-11: qty 0.5

## 2015-09-11 MED ORDER — ONDANSETRON HCL 4 MG/2ML IJ SOLN: 4.0000 mg | Freq: Four times a day (QID) | INTRAMUSCULAR | Status: DC | PRN

## 2015-09-11 MED ORDER — FAMOTIDINE 20 MG PO TABS
ORAL_TABLET | ORAL | Status: AC
  Administered 2015-09-11: 20 mg via ORAL
  Filled 2015-09-11: qty 1

## 2015-09-11 MED ORDER — ONDANSETRON HCL 4 MG/2ML IJ SOLN
INTRAMUSCULAR | Status: DC | PRN
  Administered 2015-09-11: 4 mg via INTRAVENOUS

## 2015-09-11 MED ORDER — BACITRACIN ZINC 500 UNIT/GM EX OINT
TOPICAL_OINTMENT | Freq: Every day | CUTANEOUS | Status: DC
  Administered 2015-09-11 – 2015-09-15 (×5): 1 via TOPICAL
  Filled 2015-09-11 (×6): qty 0.9

## 2015-09-11 MED ORDER — FENTANYL CITRATE (PF) 100 MCG/2ML IJ SOLN
INTRAMUSCULAR | Status: DC | PRN
  Administered 2015-09-11: 100 ug via INTRAVENOUS
  Administered 2015-09-11 (×3): 50 ug via INTRAVENOUS
  Administered 2015-09-11: 100 ug via INTRAVENOUS
  Administered 2015-09-11: 50 ug via INTRAVENOUS
  Administered 2015-09-11: 100 ug via INTRAVENOUS

## 2015-09-11 MED ORDER — FENTANYL CITRATE (PF) 100 MCG/2ML IJ SOLN
25.0000 ug | INTRAMUSCULAR | Status: DC | PRN
  Administered 2015-09-11 (×4): 25 ug via INTRAVENOUS

## 2015-09-11 MED ORDER — PROPOFOL 10 MG/ML IV BOLUS
INTRAVENOUS | Status: DC | PRN
  Administered 2015-09-11: 180 mg via INTRAVENOUS

## 2015-09-11 MED ORDER — DIPHENHYDRAMINE HCL 50 MG/ML IJ SOLN: 12.5000 mg | Freq: Four times a day (QID) | INTRAMUSCULAR | Status: DC | PRN

## 2015-09-11 MED ORDER — LIDOCAINE HCL (CARDIAC) 20 MG/ML IV SOLN
INTRAVENOUS | Status: DC | PRN
  Administered 2015-09-11: 40 mg via INTRAVENOUS

## 2015-09-11 MED ORDER — ACETAMINOPHEN 500 MG PO TABS
1000.0000 mg | ORAL_TABLET | Freq: Four times a day (QID) | ORAL | Status: DC
  Administered 2015-09-11 – 2015-09-15 (×14): 1000 mg via ORAL
  Filled 2015-09-11 (×16): qty 2

## 2015-09-11 MED ORDER — PHENYLEPHRINE HCL 10 MG/ML IJ SOLN
INTRAMUSCULAR | Status: DC | PRN
  Administered 2015-09-11: 100 ug via INTRAVENOUS

## 2015-09-11 MED ORDER — SODIUM CHLORIDE 0.9 % IV SOLN
1.0000 g | INTRAVENOUS | Status: AC
  Administered 2015-09-11: 1 g via INTRAVENOUS
  Filled 2015-09-11: qty 1

## 2015-09-11 MED ORDER — LACTATED RINGERS IV SOLN
INTRAVENOUS | Status: DC
Start: 2015-09-11 — End: 2015-09-11
  Administered 2015-09-11 (×2): via INTRAVENOUS

## 2015-09-11 MED ORDER — ACETAMINOPHEN 10 MG/ML IV SOLN
INTRAVENOUS | Status: AC
  Filled 2015-09-11: qty 100

## 2015-09-11 MED ORDER — BUPIVACAINE HCL (PF) 0.25 % IJ SOLN
INTRAMUSCULAR | Status: DC | PRN
Start: 2015-09-11 — End: 2015-09-11
  Administered 2015-09-11: 3 mL

## 2015-09-11 MED ORDER — DEXTROSE-NACL 5-0.9 % IV SOLN
INTRAVENOUS | Status: DC
  Administered 2015-09-11 – 2015-09-14 (×4): via INTRAVENOUS

## 2015-09-11 MED ORDER — HYDROMORPHONE HCL 1 MG/ML IJ SOLN
INTRAMUSCULAR | Status: DC | PRN
  Administered 2015-09-11: 1 mg via INTRAVENOUS

## 2015-09-11 MED ORDER — MIDAZOLAM HCL 2 MG/2ML IJ SOLN
INTRAMUSCULAR | Status: DC | PRN
  Administered 2015-09-11: 2 mg via INTRAVENOUS

## 2015-09-11 MED ORDER — FENTANYL CITRATE (PF) 100 MCG/2ML IJ SOLN
INTRAMUSCULAR | Status: AC
  Administered 2015-09-11: 25 ug via INTRAVENOUS
  Filled 2015-09-11: qty 2

## 2015-09-11 SURGICAL SUPPLY — 101 items
APPLIER CLIP 5 13 M/L LIGAMAX5 (MISCELLANEOUS)
BACTOSHIELD CHG 4% 4OZ (MISCELLANEOUS) ×1
BAG BILE T-TUBES STRL (MISCELLANEOUS) ×6 IMPLANT
BAG URO DRAIN 2000ML W/SPOUT (MISCELLANEOUS) ×3 IMPLANT
BLADE SURG SZ10 CARB STEEL (BLADE) ×3 IMPLANT
BULB RESERV EVAC DRAIN JP 100C (MISCELLANEOUS) ×6 IMPLANT
CANISTER SUCT 1200ML W/VALVE (MISCELLANEOUS) ×3 IMPLANT
CATH FOL 2WAY LX 20X5 (CATHETERS) ×3 IMPLANT
CATH ROBINSON RED A/P 18FR (CATHETERS) ×3 IMPLANT
CATH TRAY 16F METER LATEX (MISCELLANEOUS) IMPLANT
CATH URETL 5X70 OPEN END (CATHETERS) ×6 IMPLANT
CHLORAPREP W/TINT 26ML (MISCELLANEOUS) ×3 IMPLANT
CLIP APPLIE 5 13 M/L LIGAMAX5 (MISCELLANEOUS) IMPLANT
DRAIN CHANNEL JP 19F (MISCELLANEOUS) ×6 IMPLANT
DRAPE LEGGINS SURG 28X43 STRL (DRAPES) ×3 IMPLANT
DRAPE UNDER BUTTOCK W/FLU (DRAPES) ×3 IMPLANT
DRAPE UTILITY 15X26 TOWEL STRL (DRAPES) ×6 IMPLANT
DRSG OPSITE POSTOP 4X8 (GAUZE/BANDAGES/DRESSINGS) ×3 IMPLANT
DRSG TEGADERM 2-3/8X2-3/4 SM (GAUZE/BANDAGES/DRESSINGS) ×3 IMPLANT
ELECT CAUTERY BLADE 6.4 (BLADE) ×3 IMPLANT
ELECT REM PT RETURN 9FT ADLT (ELECTROSURGICAL) ×3
ELECTRODE REM PT RTRN 9FT ADLT (ELECTROSURGICAL) ×2 IMPLANT
GLOVE BIO SURGEON STRL SZ7 (GLOVE) ×6 IMPLANT
GLOVE BIO SURGEON STRL SZ7.5 (GLOVE) ×3 IMPLANT
GLOVE PI ORTHOPRO 6.5 (GLOVE) ×2
GLOVE PI ORTHOPRO STRL 6.5 (GLOVE) ×4 IMPLANT
GLOVE SURG SYN 7.5  E (GLOVE) ×1
GLOVE SURG SYN 7.5 E (GLOVE) ×2 IMPLANT
GOWN STRL REUS W/ TWL LRG LVL3 (GOWN DISPOSABLE) ×16 IMPLANT
GOWN STRL REUS W/ TWL LRG LVL4 (GOWN DISPOSABLE) ×2 IMPLANT
GOWN STRL REUS W/TWL LRG LVL3 (GOWN DISPOSABLE) ×8
GOWN STRL REUS W/TWL LRG LVL4 (GOWN DISPOSABLE) ×1
GOWN STRL REUS W/TWL XL LVL3 (GOWN DISPOSABLE) ×3 IMPLANT
GRASPER SUT TROCAR 14GX15 (MISCELLANEOUS) IMPLANT
HANDLE YANKAUER SUCT BULB TIP (MISCELLANEOUS) ×6 IMPLANT
IRRIGATION STRYKERFLOW (MISCELLANEOUS) IMPLANT
IRRIGATOR STRYKERFLOW (MISCELLANEOUS)
IV NS 1000ML (IV SOLUTION) ×1
IV NS 1000ML BAXH (IV SOLUTION) ×2 IMPLANT
IV SOD CHL 0.9% 1000ML (IV SOLUTION) ×3 IMPLANT
KIT RM TURNOVER CYSTO AR (KITS) ×6 IMPLANT
LABEL OR SOLS (LABEL) ×3 IMPLANT
LIQUID BAND (GAUZE/BANDAGES/DRESSINGS) IMPLANT
LOOP OSTOMY BRIDGE (OSTOMY) ×3 IMPLANT
NDL SAFETY 22GX1.5 (NEEDLE) ×3 IMPLANT
NS IRRIG 500ML POUR BTL (IV SOLUTION) ×3 IMPLANT
PACK BASIN MINOR ARMC (MISCELLANEOUS) ×3 IMPLANT
PACK COLON CLEAN CLOSURE (MISCELLANEOUS) ×3 IMPLANT
PACK CYSTO AR (MISCELLANEOUS) ×3 IMPLANT
PACK LAP CHOLECYSTECTOMY (MISCELLANEOUS) ×3 IMPLANT
PENCIL ELECTRO HAND CTR (MISCELLANEOUS) ×3 IMPLANT
RELOAD PROXIMATE 75MM BLUE (ENDOMECHANICALS) ×3 IMPLANT
RELOAD STAPLER BLUE 60MM (STAPLE) IMPLANT
RETRACTOR WOUND ALXS 18CM SML (MISCELLANEOUS) IMPLANT
RTRCTR WOUND ALEXIS O 18CM SML (MISCELLANEOUS)
SCRUB CHG 4% DYNA-HEX 4OZ (MISCELLANEOUS) ×2 IMPLANT
SEALER TISSUE G2 STRG ARTC 35C (ENDOMECHANICALS) IMPLANT
SENSORWIRE 0.038 NOT ANGLED (WIRE) ×3
SET BERKELEY SUCTION TUBING (SUCTIONS) ×3 IMPLANT
SET CYSTO W/LG BORE CLAMP LF (SET/KITS/TRAYS/PACK) ×3 IMPLANT
SHEARS HARMONIC ACE PLUS 36CM (ENDOMECHANICALS) ×3 IMPLANT
SLEEVE ENDOPATH XCEL 5M (ENDOMECHANICALS) ×3 IMPLANT
SOL .9 NS 3000ML IRR  AL (IV SOLUTION)
SOL .9 NS 3000ML IRR UROMATIC (IV SOLUTION) IMPLANT
SOL PREP PVP 2OZ (MISCELLANEOUS)
SOLUTION PREP PVP 2OZ (MISCELLANEOUS) IMPLANT
SPONGE LAP 18X18 5 PK (GAUZE/BANDAGES/DRESSINGS) ×9 IMPLANT
SPONGE LAP 18X36 2PK (MISCELLANEOUS) ×3 IMPLANT
STAPLE ECHEON FLEX 60 POW ENDO (STAPLE) IMPLANT
STAPLER ENDO ILS CVD 18 33 (STAPLE) ×3 IMPLANT
STAPLER PROXIMATE 75MM BLUE (STAPLE) ×3 IMPLANT
STAPLER RELOAD BLUE 60MM (STAPLE)
STAPLER SKIN PROX 35W (STAPLE) IMPLANT
STENT URET 6FRX24 CONTOUR (STENTS) IMPLANT
STENT URET 6FRX26 CONTOUR (STENTS) IMPLANT
SURGILUBE 2OZ TUBE FLIPTOP (MISCELLANEOUS) ×3 IMPLANT
SUT ETHILON 3-0 KS 30 BLK (SUTURE) ×9 IMPLANT
SUT MNCRL AB 4-0 PS2 18 (SUTURE) IMPLANT
SUT NYLON 2-0 (SUTURE) IMPLANT
SUT PDS 2-0 27IN (SUTURE) IMPLANT
SUT PROLENE 2 0 SH DA (SUTURE) ×3 IMPLANT
SUT SILK 2 0 (SUTURE) ×2
SUT SILK 2-0 30XBRD TIE 12 (SUTURE) ×4 IMPLANT
SUT SILK 3 0 SH 30 (SUTURE) ×9 IMPLANT
SUT SILK 3-0 (SUTURE) ×12 IMPLANT
SUT VIC AB 2-0 CT2 27 (SUTURE) ×6 IMPLANT
SUT VIC AB 2-0 SH 27 (SUTURE) ×1
SUT VIC AB 2-0 SH 27XBRD (SUTURE) ×2 IMPLANT
SUT VIC AB 3-0 SH 27 (SUTURE) ×1
SUT VIC AB 3-0 SH 27X BRD (SUTURE) ×2 IMPLANT
SUT VICRYL 0 AB UR-6 (SUTURE) IMPLANT
SYRINGE IRR TOOMEY STRL 70CC (SYRINGE) ×3 IMPLANT
TAPE TRANSPORE STRL 2 31045 (GAUZE/BANDAGES/DRESSINGS) ×6 IMPLANT
TOWEL OR 17X26 4PK STRL BLUE (TOWEL DISPOSABLE) ×3 IMPLANT
TROCAR XCEL 12X100 BLDLESS (ENDOMECHANICALS) ×3 IMPLANT
TROCAR XCEL BLUNT TIP 100MML (ENDOMECHANICALS) ×3 IMPLANT
TROCAR XCEL NON-BLD 5MMX100MML (ENDOMECHANICALS) ×3 IMPLANT
TUBING ART PRESS 48 MALE/FEM (TUBING) ×6 IMPLANT
TUBING INSUFFLATOR HEATED (MISCELLANEOUS) ×3 IMPLANT
WATER STERILE IRR 1000ML POUR (IV SOLUTION) ×3 IMPLANT
WIRE SENSOR 0.038 NOT ANGLED (WIRE) ×2 IMPLANT

## 2015-09-11 NOTE — Op Note (Signed)
Date of procedure: 09/11/2015  Preoperative diagnosis:  1. Colovesical fistula  Postoperative diagnosis:  1. Colovesical fistula   Procedure: 1. Cystoscopy 2. Bilateral ureteral catheter placement 3. Bladder repair  Surgeon: Baruch Gouty, MD  Anesthesia: General  Complications: None  Intraoperative findings: Food particulate was noted in the bladder during cystoscopy as well as inflammation at the dome from a likely fistula. Bilateral ureteral catheters were placed under fluoroscopy.  EBL: None  Specimens: None  Drains: Bilateral ureteral catheters, 20 French Foley catheter  Disposition: Stable to the postanesthesia care unit  Indication for procedure: The patient is a 50 y.o. male with a colovesical fistula who presents today for definitive management by general surgery. Per request of the primary surgeon, I was asked to place ureteral catheters preoperatively as well as assist with closure of the fistula.  After reviewing the management options for treatment, the patient elected to proceed with the above surgical procedure(s). We have discussed the potential benefits and risks of the procedure, side effects of the proposed treatment, the likelihood of the patient achieving the goals of the procedure, and any potential problems that might occur during the procedure or recuperation. Informed consent has been obtained.  Description of procedure: The patient was met in the preoperative area. All risks, benefits, and indications of the procedure were described in great detail. The patient consented to the procedure. Preoperative antibiotics were given. The patient was taken to the operative theater. General anesthesia was induced per the anesthesia service. The patient was then placed in the dorsal lithotomy position and prepped and draped in the usual sterile fashion. A preoperative timeout was called. A 21 French 30 cystoscope was inserted in the patient's bladder per urethra  atraumatically. Cystoscopy did reveal food particular as well as edema at the dome of the bladder from the apparent fistula.Sensor wire was placed up to the level of the left renal pelvis on fluoroscopy. An open-ended ureteral catheter was placed at the level of the renal pelvis and the sensor wire was removed. This process was repeated on the contralateral side in identical fashion. Cystoscope was then withdrawn leaving the ureteral catheters protruding from the urethral meatus. A 20 French Foley catheter was then placed. The OR was then transferred over to general surgery.  I was called back to the operating room after the colon resection with anastomosis had been performed. As expected, per the general surgeon that was fixed the bladder. 360 cc of normal saline tinted with methylene blue instilled the bladder without extravasation was noted. We are unable to identify a fistulous tract. This point we decided to oversew the area with the colon had been affixed to the bladder at the dome. This was oversewn with a 2-0 figure-of-eight silk. The omentum was then brought down and placed over this area separated from the colon. Again we could not find any leakage from the fistulous tract of this though area was oversewn to be safe. [place] Pink turned back over general surgery plans to leave a drain.   Plan: The patient was admitted to the floor under general surgery. We will remove one of the ureteral catheters today. We will remove the other tomorrow. The Foley will continue for 14 days with a cystogram prior to removal. Okay from urology standpoint to discontinue JP drains once output is less than 100 cc.   Baruch Gouty, M.D.

## 2015-09-11 NOTE — Anesthesia Procedure Notes (Signed)
Procedure Name: Intubation Date/Time: 09/11/2015 7:45 AM Performed by: Henrietta HooverPOPE, Cortlyn Cannell Pre-anesthesia Checklist: Patient identified, Emergency Drugs available, Suction available, Patient being monitored and Timeout performed Patient Re-evaluated:Patient Re-evaluated prior to inductionOxygen Delivery Method: Circle system utilized Preoxygenation: Pre-oxygenation with 100% oxygen Intubation Type: IV induction Ventilation: Mask ventilation without difficulty Laryngoscope Size: Mac and 4 Grade View: Grade II Tube type: Oral Tube size: 7.5 mm Number of attempts: 1 Airway Equipment and Method: Stylet Placement Confirmation: ETT inserted through vocal cords under direct vision,  positive ETCO2 and breath sounds checked- equal and bilateral Secured at: 22 cm Tube secured with: Tape Dental Injury: Teeth and Oropharynx as per pre-operative assessment

## 2015-09-11 NOTE — Interval H&P Note (Signed)
History and Physical Interval Note:  09/11/2015 7:27 AM  Peter [name]  has presented today for surgery, with the diagnosis of Colovesical fistula  The various methods of treatment have been discussed with the patient and family. After consideration of risks, benefits and other options for treatment, the patient has consented to  Procedure(s): LAPAROSCOPIC PARTIAL COLECTOMY Possible Open, possible ostomy, repair of colovesical fistula Cystoscopy and stent placement, repair of colovesical fistula with Dr. Sherryl BartersBudzyn as well  (N/A) CYSTOSCOPY WITH STENT PLACEMENT (Bilateral) BLADDER REPAIR (N/A) as a surgical intervention .  The patient's history has been reviewed, patient examined, no change in status, stable for surgery.  I have reviewed the patient's chart and labs.  Questions were answered to the patient's satisfaction.     Symphonie Schneiderman L Aubriauna Riner

## 2015-09-11 NOTE — Anesthesia Postprocedure Evaluation (Signed)
Anesthesia Post Note  Patient: Scientist, [name]  Procedure(s) Performed: Procedure(s) (LRB): LAPAROSCOPIC PARTIAL COLECTOMY Possible Open, possible ostomy, repair of colovesical fistula Cystoscopy and stent placement, repair of colovesical fistula with Dr. Sherryl BartersBudzyn as well  (N/A) CYSTOSCOPY WITH STENT PLACEMENT (Bilateral) BLADDER REPAIR (N/A)  Patient location during evaluation: PACU Anesthesia Type: General Level of consciousness: awake and alert and oriented Pain management: pain level controlled Vital Signs Assessment: post-procedure vital signs reviewed and stable Respiratory status: spontaneous breathing Cardiovascular status: blood pressure returned to baseline Anesthetic complications: no    Last Vitals:  Filed Vitals:   09/11/15 1907 09/11/15 2040  BP: 119/79 123/68  Pulse: 88 76  Temp:  36.8 C  Resp: 20 20    Last Pain:  Filed Vitals:   09/11/15 2040  PainSc: 2                  Drexel Ivey

## 2015-09-11 NOTE — OR Nursing (Signed)
Dr. Orvis BrillLoflin pulled one of the urethral stents out. She did not know which one she pulled.

## 2015-09-11 NOTE — H&P (View-Only) (Signed)
Subjective:     Patient ID: Peter LericheMark A. Careaga, male   DOB: 12/23/1965, 50 y.o.   MRN: [id number]  HPI  50 year old male history of diverticulitis,and now large Colovesical fistula.  Patient states that he has had diverticulitis many times approximately 7 since about the year 2002. We discussed at the last visit that he would need a operation for this, he has seen the urologist Dr. Sherryl BartersBudzyn in the meantime, who is agreeable to help with stent placement and fistula repair.  Patient has also had colonoscopy with Dr. Servando SnareWohl on 2/10 which showed an 8 mm polyp in the rectum that was tubular adenoma and a fear stenosis of the sigmoid colon which could not be transversed. Patient also stated that along with the bowel prep for his colonoscopy began vomiting at the time of his colonoscopy and felt as if things were moving through as well. Patient states that he is having bowel movements daily but that the caliber of them are smaller. Patient does not feel back up or constipated.Patient denies any fever chills nausea or vomiting or abdominal pain at this time, but does endorse pneumaturia and dysuria.  Past Medical History  Diagnosis Date  . GERD (gastroesophageal reflux disease)   . Generalized headaches   . Rectal pain   . Thrombosed hemorrhoids   . Diverticulitis   . COPD (chronic obstructive pulmonary disease) (HCC)   . Arthritis     neck, shoulders   Past Surgical History  Procedure Laterality Date  . Cholecystectomy  1989  . Tonsillectomy  1970  . Appendectomy  1988  . Colonoscopy with propofol N/A 08/16/2015    Procedure: COLONOSCOPY WITH PROPOFOL;  Surgeon: Midge Miniumarren Wohl, MD;  [facility];  Service: Endoscopy;  Laterality: N/A;  . Polypectomy  08/16/2015    Procedure: POLYPECTOMY;  Surgeon: Midge Miniumarren Wohl, MD;  [facility];  Service: Endoscopy;;   Family History  Problem Relation Age of Onset  . Cholecystitis Mother   . Lung cancer Father   . Emphysema Paternal  Grandmother   . Diabetes Paternal Grandmother   . Prostate cancer Maternal Uncle    Social History   Social History  . Marital Status: Single    Spouse Name: N/A  . Number of Children: N/A  . Years of Education: N/A   Social History Main Topics  . Smoking status: Current Every Day Smoker -- 1.00 packs/day for 30 years    Types: Cigarettes  . Smokeless tobacco: Never Used  . Alcohol Use: No  . Drug Use: No  . Sexual Activity: Not Asked   Other Topics Concern  . None   Social History Narrative    Current outpatient prescriptions:  .  Polyethylene Glycol POWD, Take 17 g by mouth 2 (two) times daily., Disp: 527 g, Rfl: 5 .  varenicline (CHANTIX PAK) 0.5 MG X 11 & 1 MG X 42 tablet, Take one 0.5 mg tablet by mouth once daily for 3 days, then increase to one 0.5 mg tablet twice daily for 4 days, then increase to one 1 mg tablet twice daily., Disp: 53 tablet, Rfl: 0 No Known Allergies     Review of Systems  Constitutional: Negative for fever, chills, activity change, appetite change and fatigue.  HENT: Negative for congestion and sore throat.   Respiratory: Negative for apnea, cough, shortness of breath and wheezing.   Cardiovascular: Negative for chest pain, palpitations and leg swelling.  Gastrointestinal: Negative for nausea, vomiting, abdominal pain, diarrhea, constipation, blood in  stool, abdominal distention and anal bleeding.  Genitourinary: Positive for dysuria and hematuria.  Musculoskeletal: Negative for back pain.  Skin: Negative for color change, pallor, rash and wound.  Neurological: Negative for dizziness and weakness.  Hematological: Negative for adenopathy. Does not bruise/bleed easily.  Psychiatric/Behavioral: Negative for agitation. The patient is not nervous/anxious.   All other systems reviewed and are negative.  Filed Vitals:   08/20/15 0805  BP: 143/82  Pulse: 90  Temp: 98.3 F (36.8 C)       Objective:   Physical Exam  Constitutional: He is  oriented to person, place, and time. He appears well-developed and well-nourished. No distress.  HENT:  Head: Normocephalic and atraumatic.  Nose: Nose normal.  Mouth/Throat: Oropharynx is clear and moist. No oropharyngeal exudate.  Eyes: Conjunctivae are normal. Pupils are equal, round, and reactive to light. No scleral icterus.  Neck: Normal range of motion. Neck supple. No tracheal deviation present.  Cardiovascular: Normal rate, regular rhythm, normal heart sounds and intact distal pulses.  Exam reveals no gallop and no friction rub.   No murmur heard. Pulmonary/Chest: Effort normal and breath sounds normal. No respiratory distress. He has no wheezes.  Abdominal: Soft. Bowel sounds are normal. He exhibits no distension. There is no tenderness. There is no rebound.  Well healed previous surgical scars  Musculoskeletal: Normal range of motion. He exhibits no edema or tenderness.  Neurological: He is alert and oriented to person, place, and time. No cranial nerve deficit.  Skin: Skin is warm and dry. No rash noted. No erythema. No pallor.  Psychiatric: He has a normal mood and affect. His behavior is normal. Judgment and thought content normal.  Vitals reviewed.  Pathology report: 8 mm rectal polyp Tubular adenoma, no high grade dysplasia or malignancy    Assessment:     49 yr old with Colovesical fistula and sigmoid stenosis from Diverticulitis     Plan:     I have personally reviewed his past medical history, including his past bouts of diverticulitis, as well as his visit with Dr. Wilton Smith and his primary care provider regarding his diverticulitis. I have personally reviewed his laboratory values, Showing a positive UA with Escherichia coli in his urine , however no elevated white blood cell count. I have personally reviewed his CT scan of his abdomen and pelvis that was performed that showed thickening and inflammation of his sigmoid colon and a connection between the sigmoid  colon and the bladder with inflammation surrounding a portion of the bladder. I have also reviewed his radiology reads.  I have reviewed his notes from the urologist Dr. Budzyn and the gastroenterologist Dr. Wohl, as well as the colonoscopy procedure report showing small polyp and sigmoid colonic stenosis. I have also reviewed the pathology as above of tubular adenoma.   I discussed the risk benefits alternatives and treatment options with the patient and his friend to include Laparoscopic resection of the colon and repair of his colovesical fistula possible open, possible ostomy placement. I discussed with the patient that the problem would not improve over time and with antibiotics could only temporize his symptoms. I discussed with them that the risk of surgery were bleeding, infection, anastomotic leak, abscess formation, need for drain placement, damage to the ureters, damage to bladder, need for prolonged catheter after surgery, possible need to convert to open procedure, possible need for ostomy placement, possible need for blood products during surgery, possible need for reoperation; as well as anesthetic risk of heart attack   stroke and blood clots and death. I also discussed with him that his length of hospital stay will be anywhere between 5-10 days depending on operation and if any complications arise and that he would need a Foley catheter for at least a week to 14 days after the procedure. 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   I again discussed his tobacco use , and recommended that he stop smoking. I discussed with him different options such as nicotine,, nicotine patches, Wellbutrin or Chantix to help him quit. Patient stated that he wanted to start the Chantix but that the pharmacy did not have the prescription from the last  visit where it was e-prescribed.  I apologized for this and gave him a new prescription for the Chantix this visit. I discussed with him that he will start taking the Chantix and within 7-10 days should pick a date to stop smoking at that time. Also discussed with him that he should use nicotine gum to help with the acute cravings during that time as well.  I spent 10 mins discussing smoking cessation alone.  I spent a total of 60 minutes with over 50% of that time with the patient in counseling, education, planning and coordination of care.

## 2015-09-11 NOTE — Brief Op Note (Signed)
09/11/2015  9:02 PM  PATIENT:  Redge GainerMark A Kise  50 y.o. male  PRE-OPERATIVE DIAGNOSIS:  Colovesical fistula  POST-OPERATIVE DIAGNOSIS:  Colovesical fistula  PROCEDURE:  Procedure(s): LAPAROSCOPIC PARTIAL COLECTOMY Possible Open, possible ostomy, repair of colovesical fistula Cystoscopy and stent placement, repair of colovesical fistula with Dr. Sherryl BartersBudzyn as well  (N/A) CYSTOSCOPY WITH STENT PLACEMENT (Bilateral) BLADDER REPAIR (N/A)  SURGEON:  Surgeon(s) and Role: Panel 1:    * Gladis Riffleatherine L Chasity Outten, MD - Primary  Panel 2:    * Hildred LaserBrian James Budzyn, MD - Primary  PHYSICIAN ASSISTANT:   ASSISTANTS: Dr. Westley Gamblesim Oaks, MD   ANESTHESIA:  GETA  EBL:  Total I/O In: -  Out: 250 [Urine:250]  BLOOD ADMINISTERED:none  DRAINS: (2) Jackson-Pratt drain(s) with closed bulb suction in the pelvis   LOCAL MEDICATIONS USED:  NONE  SPECIMEN:  Source of Specimen:  sigmoid colon  DISPOSITION OF SPECIMEN:  PATHOLOGY  COUNTS:  YES  TOURNIQUET:  * No tourniquets in log *  DICTATION: .Dragon Dictation  PLAN OF CARE: Admit to inpatient   PATIENT DISPOSITION:  PACU - hemodynamically stable.   Delay start of Pharmacological VTE agent (>24hrs) due to surgical blood loss or risk of bleeding: not applicable

## 2015-09-11 NOTE — Anesthesia Preprocedure Evaluation (Addendum)
Anesthesia Evaluation  Patient identified by MRN, date of birth, ID band Patient awake    Reviewed: Allergy & Precautions, NPO status , Patient's Chart, lab work & pertinent test results, reviewed documented beta blocker date and time   Airway Mallampati: III  TM Distance: >3 FB     Dental  (+) Chipped   Pulmonary COPD, Current Smoker,           Cardiovascular      Neuro/Psych  Headaches, Anxiety  Neuromuscular disease    GI/Hepatic GERD  ,  Endo/Other    Renal/GU      Musculoskeletal  (+) Arthritis ,   Abdominal   Peds  Hematology   Anesthesia Other Findings Obese. Good neck movement. EKG ok.  Reproductive/Obstetrics                            Anesthesia Physical Anesthesia Plan  ASA: II  Anesthesia Plan: General   Post-op Pain Management:    Induction: Intravenous  Airway Management Planned: Oral ETT  Additional Equipment:   Intra-op Plan:   Post-operative Plan:   Informed Consent: I have reviewed the patients History and Physical, chart, labs and discussed the procedure including the risks, benefits and alternatives for the proposed anesthesia with the patient or authorized representative who has indicated his/her understanding and acceptance.     Plan Discussed with: CRNA  Anesthesia Plan Comments:         Anesthesia Quick Evaluation

## 2015-09-11 NOTE — Telephone Encounter (Signed)
-----   Message from Hildred LaserBrian James Budzyn, MD sent at 09/11/2015  1:36 PM EST ----- Patient will need a cystogram in 2 weeks and follow up appt with me soon after that. He is still in the OR currently

## 2015-09-11 NOTE — H&P (Signed)
08/02/2015 9:37 AM   Peter [name] 06/08/1966 [id number]  Referring provider: Gabriel Cirri, NP [address], [place] [address]  Chief Complaint  Patient presents with  . New Patient (Initial Visit)    colovesical fistula from Chronic sigmoid diverticulitis    HPI: The patient is a 50 year old gentleman referred to me by Dr. Orvis Brill for a colovesical fistula. He he says this started approximately 2-3 months ago. He has pneumaturia as well as passing particles in his urine. He is also had urinary tract infections. He notes discomfort. A CT scan does show an apparent connection between his bladder and colon. He is scheduled for a colovesical physical takedown early March with Dr. Orvis Brill.   PMH: Past Medical History  Diagnosis Date  . Arthritis   . GERD (gastroesophageal reflux disease)   . Generalized headaches   . Rectal pain   . Thrombosed hemorrhoids   . Diverticulitis     Surgical History: Past Surgical History  Procedure Laterality Date  . Cholecystectomy  1989  . Tonsillectomy  1970  . Appendectomy  1988    Home Medications:    Medication List       This list is accurate as of: 08/02/15 9:37 AM. Always use your most recent med list.              ciprofloxacin 500 MG tablet  Commonly known as: CIPRO  Take 1 tablet by mouth 2 (two) times daily.     FLAGYL 500 MG tablet  Generic drug: metroNIDAZOLE  Take 1 tablet by mouth 3 (three) times daily.     HYDROcodone-acetaminophen 5-325 MG tablet  Commonly known as: NORCO/VICODIN  Take 1 tablet by mouth every 6 (six) hours as needed for moderate pain.     hyoscyamine 0.125 MG tablet  Commonly known as: LEVSIN, ANASPAZ  Take by mouth. Reported on 08/02/2015     varenicline 0.5 MG X 11 & 1 MG X 42 tablet  Commonly known as: CHANTIX PAK  Take one 0.5 mg tablet by mouth once daily for 3 days, then increase to one 0.5 mg  tablet twice daily for 4 days, then increase to one 1 mg tablet twice daily.        Allergies: No Known Allergies  Family History: Family History  Problem Relation Age of Onset  . Cholecystitis Mother   . Lung cancer Father   . Emphysema Paternal Grandmother   . Diabetes Paternal Grandmother   . Prostate cancer Maternal Uncle     Social History:  reports that he has been smoking Cigarettes. He has been smoking about 1.00 pack per day. He has never used smokeless tobacco. He reports that he does not drink alcohol or use illicit drugs.  ROS: UROLOGY Frequent Urination?: No Hard to postpone urination?: Yes Burning/pain with urination?: Yes Get up at night to urinate?: Yes Leakage of urine?: No Urine stream starts and stops?: No Trouble starting stream?: Yes Do you have to strain to urinate?: No Blood in urine?: Yes Urinary tract infection?: Yes Sexually transmitted disease?: No Injury to kidneys or bladder?: No Painful intercourse?: No Weak stream?: No Erection problems?: No Penile pain?: No  Gastrointestinal Nausea?: Yes Vomiting?: Yes Indigestion/heartburn?: Yes Diarrhea?: Yes Constipation?: No  Constitutional Fever: Yes Night sweats?: No Weight loss?: No Fatigue?: Yes  Skin Skin rash/lesions?: Yes Itching?: Yes  Eyes Blurred vision?: No Double vision?: No  Ears/Nose/Throat Sore throat?: No Sinus problems?: No  Hematologic/Lymphatic Swollen glands?: No Easy bruising?: No  Cardiovascular  Leg swelling?: No Chest pain?: Yes  Respiratory Cough?: Yes Shortness of breath?: Yes  Endocrine Excessive thirst?: No  Musculoskeletal Back pain?: Yes Joint pain?: Yes  Neurological Headaches?: No Dizziness?: No  Psychologic Depression?: Yes Anxiety?: No  Physical Exam: BP 122/78 mmHg  Pulse 85  Ht  (1.753 m)  Wt 217 lb 8 oz (98.657 kg)  BMI 32.10 kg/m2  Constitutional: Alert and oriented, No acute  distress. HEENT: [place] AT, moist mucus membranes. Trachea midline, no masses. Cardiovascular: No clubbing, cyanosis, or edema. RRR Respiratory: Normal respiratory effort, no increased work of breathing. GI: Abdomen is soft, nontender, nondistended, no abdominal masses GU: No CVA tenderness.  Skin: No rashes, bruises or suspicious lesions. Lymph: No cervical or inguinal adenopathy. Neurologic: Grossly intact, no focal deficits, moving all 4 extremities. Psychiatric: Normal mood and affect.  Laboratory Data:  Recent Labs    Lab Results  Component Value Date   WBC 6.3 12/31/2014   HGB 14.7 12/31/2014   HCT 44.2 12/31/2014   MCV 91.6 12/31/2014   PLT 246 12/31/2014       Recent Labs    Lab Results  Component Value Date   CREATININE 1.00 12/31/2014       Recent Labs    No results found for: PSA     Recent Labs    No results found for: TESTOSTERONE     Recent Labs    No results found for: HGBA1C    Urinalysis  Labs (Brief)       Component Value Date/Time   COLORURINE YELLOW* 12/31/2014 1003   COLORURINE Yellow 09/09/2011 1744   APPEARANCEUR CLEAR* 12/31/2014 1003   APPEARANCEUR Clear 09/09/2011 1744   LABSPEC 1.020 12/31/2014 1003   LABSPEC 1.008 09/09/2011 1744   PHURINE 7.0 12/31/2014 1003   PHURINE 6.0 09/09/2011 1744   GLUCOSEU NEGATIVE 12/31/2014 1003   GLUCOSEU Negative 09/09/2011 1744   HGBUR NEGATIVE 12/31/2014 1003   HGBUR Negative 09/09/2011 1744   BILIRUBINUR NEGATIVE 12/31/2014 1003   BILIRUBINUR Negative 09/09/2011 1744   KETONESUR NEGATIVE 12/31/2014 1003   KETONESUR Negative 09/09/2011 1744   PROTEINUR NEGATIVE 12/31/2014 1003   PROTEINUR Negative 09/09/2011 1744   NITRITE NEGATIVE 12/31/2014 1003   NITRITE Negative 09/09/2011 1744   LEUKOCYTESUR TRACE* 12/31/2014 1003   LEUKOCYTESUR Negative 09/09/2011 1744       Pertinent Imaging: CLINICAL DATA: Pelvic pain for several months. Nausea, vomiting and diarrhea.  EXAM: CT ABDOMEN AND PELVIS WITH CONTRAST  TECHNIQUE: Multidetector CT imaging of the abdomen and pelvis was performed using the standard protocol following bolus administration of intravenous contrast.  CONTRAST: OMNIPAQUE IOHEXOL 300 MG/ML SOLN  COMPARISON: CT scan of September 12, 2011.  FINDINGS: Severe degenerative disc disease is noted at T11-12 and L1-2. Visualized lung bases are unremarkable.  Status post cholecystectomy. The liver and pancreas appear normal. Splenic granulomata are noted. Adrenal glands and kidneys appear normal. No hydronephrosis or renal obstruction is noted. Atherosclerosis of abdominal aorta is noted without aneurysm formation. There is no evidence of bowel obstruction. There appears to be sigmoid diverticulitis with gas collection between the sigmoid colon and superior wall of urinary bladder, which most likely represents contained perforation. There is severe focal wall thickening of the adjacent wall of the urinary bladder which is secondary to previously described inflammation. No significant adenopathy is noted.  IMPRESSION: Atherosclerosis of abdominal aorta without aneurysm formation.  Findings are concerning for sigmoid diverticulitis with large air collection seen between sigmoid colon and urinary bladder  most consistent with contained perforation. There is also noted severe wall thickening of the superior portion of the urinary bladder wall related to this inflammation, and the possibility of fistula formation cannot be excluded. These results will be called to the ordering clinician or representative by the Radiologist Assistant, and communication documented in the PACS or zVision Dashboard.  Assessment & Plan:   The patient is scheduled to go under a colovesical fistula takedown with Dr. Orvis BrillLoflin in early March. I  discussed with the patient will my role would be in this procedure. We discussed in great detail cystoscopy, bilateral stent placement, and bladder closure. He understands that he will need a Foley catheter for 14 days with a cystogram prior. He also understands the risks of surgery which include but are not limited to bleeding, infection, DVT, injury to surrounding structures, and prolonged catheterization. He is agreeable to proceeding with this procedure. I will be available to assist Dr. Orvis BrillLoflin at the time of the scheduled procedure in early March.  1. Colovesical fistula -Cystoscopy, bilateral ureteral stent placement, bladder repair    Hildred LaserBrian James Atlee Villers, MD  [facility] [address], [address] [place], [place] [id number] [phone]

## 2015-09-11 NOTE — Transfer of Care (Signed)
Immediate Anesthesia Transfer of Care Note  Patient: Redge GainerMark A Schara  Procedure(s) Performed: Procedure(s): LAPAROSCOPIC PARTIAL COLECTOMY Possible Open, possible ostomy, repair of colovesical fistula Cystoscopy and stent placement, repair of colovesical fistula with Dr. Sherryl BartersBudzyn as well  (N/A) CYSTOSCOPY WITH STENT PLACEMENT (Bilateral) BLADDER REPAIR (N/A)  Patient Location: PACU  Anesthesia Type:General  Level of Consciousness: sedated  Airway & Oxygen Therapy: Patient Spontanous Breathing and Patient connected to face mask oxygen  Post-op Assessment: Report given to RN and Post -op Vital signs reviewed and stable  Post vital signs: Reviewed and stable  Last Vitals:  Filed Vitals:   09/11/15 0607  BP: 146/81  Pulse: 96  Temp: 36.5 C  Resp: 16    Complications: No apparent anesthesia complications

## 2015-09-12 ENCOUNTER — Encounter: Payer: Self-pay | Admitting: Surgery

## 2015-09-12 LAB — BASIC METABOLIC PANEL
Anion gap: 6 (ref 5–15)
BUN: 12 mg/dL (ref 6–20)
CALCIUM: 8 mg/dL — AB (ref 8.9–10.3)
CO2: 28 mmol/L (ref 22–32)
CREATININE: 1.03 mg/dL (ref 0.61–1.24)
Chloride: 103 mmol/L (ref 101–111)
GLUCOSE: 154 mg/dL — AB (ref 65–99)
Potassium: 4.1 mmol/L (ref 3.5–5.1)
Sodium: 137 mmol/L (ref 135–145)

## 2015-09-12 LAB — CBC
HEMATOCRIT: 37.3 % — AB (ref 40.0–52.0)
Hemoglobin: 12.9 g/dL — ABNORMAL LOW (ref 13.0–18.0)
MCH: 30.9 pg (ref 26.0–34.0)
MCHC: 34.6 g/dL (ref 32.0–36.0)
MCV: 89.2 fL (ref 80.0–100.0)
PLATELETS: 178 10*3/uL (ref 150–440)
RBC: 4.18 MIL/uL — ABNORMAL LOW (ref 4.40–5.90)
RDW: 13.5 % (ref 11.5–14.5)
WBC: 10.1 10*3/uL (ref 3.8–10.6)

## 2015-09-12 MED ORDER — ALBUTEROL SULFATE (2.5 MG/3ML) 0.083% IN NEBU: 2.5000 mg | INHALATION_SOLUTION | RESPIRATORY_TRACT | Status: DC | PRN

## 2015-09-12 MED ORDER — SODIUM CHLORIDE 0.9 % IV SOLN
1.0000 g | Freq: Once | INTRAVENOUS | Status: AC
  Administered 2015-09-12: 1 g via INTRAVENOUS
  Filled 2015-09-12: qty 1

## 2015-09-12 MED ORDER — HYDROMORPHONE HCL 1 MG/ML IJ SOLN
1.0000 mg | INTRAMUSCULAR | Status: DC | PRN
  Administered 2015-09-12 – 2015-09-14 (×7): 1 mg via INTRAVENOUS
  Filled 2015-09-12 (×7): qty 1

## 2015-09-12 NOTE — Op Note (Signed)
Laparoscopic converted to open Partial Colectomy with colovesical fistula takedown Procedure Note  Indications: This is a 50 year old male who's had multiple bouts of diverticulitis and now has a colovesical fistula.  Pre-operative Diagnosis: Colovesical fistula  Post-operative Diagnosis: same  Surgeon: Gladis Riffleatherine L Albertus Chiarelli   Assistants: Westley Gamblesim Oaks, MD  Anesthesia: General endotracheal anesthesia  ASA Class: 2  Procedure Details  The patient was seen in the Holding Room. The risks, benefits, complications, treatment options, and expected outcomes were discussed with the patient. The possibilities of reaction to medication, pulmonary aspiration, perforation of viscus, bleeding, recurrent infection, finding a normal colon, the need for additional procedures, failure to diagnose a condition, and creating a complication requiring transfusion or operation were discussed with the patient. The patient concurred with the proposed plan, giving informed consent.   The patient was taken to the operating room identified as [name] and the procedure verified as partial colectomy. A Time Out was held and the above information confirmed.  The patient was brought to the operating room and placed supine. After induction of a general anesthetic, patient was placed in lithotomy position and Dr. Sherryl BartersBudzyn performed a cystoscopy and placement of bilateral ureteral stents. One-half percent Marcaine with epinephrine was used to anesthetize the skin around the umbilicus. Subcutaneous tissue was divided and abdominal fascia was elevated up and incised and entered into the abdominal cavity. 0 Vicryl was then placed on either side of the abdominal fascia as stay sutures. A 12 mm trocar was placed inside the abdomen.  The abdomen was insufflated which the patient tolerated well and a camera was placed inside upon inspection of the abdomen there was a large amount of yellowish fluid and inflammatory fluid in the pelvis. At  that time it was decided given the amount of inflammation that it would not be prudent to continue laparoscopically. At this time a 10 blade scalpel was then used to lengthen the incision beneath the umbilicus until just above the suprapubic area. Bovie electrocautery used to dissect down through subcutaneous tissues and open the fascia along the length of the incision   Abdomen was inspected and the small bowel liver and stomach all appeared normal. There was a large amount of inflammatory fluid in the pelvis which was suctioned out about 200 mL. The colon was inspected and was indeed attached to the bladder the sigmoid area. Careful dissection of this area was begun by first mobilizing the descending and sigmoid colon laterally along the white line of Toldt total up to the adhesed area. At this point dissection was used to dissect the colon off of the bladder being careful to identify the ureters into the intact and unharmed. A 3 cm segment of the sigmoid colon was removed from the bladder and this was then resected harmonic was used to resect the colon off of the mesentery staying away from the lower vessels. This point the area was then inspected and further sigmoid colon down deep in the pelvis was attached to the bladder. Again careful dissection was used to dissect this area away from the bladder and over the colonic mucosa was very friable in this area. Dissection was carried down deep past the pelvic brim. The colon was then resected using Metzenbaum's scissors and Allis clamps were placed on the open mucosa down in the pelvis.  This specimen was placed with the remainder of the sigmoid colon which was opened up on the back table to reveal no polyps. The specimen was sent to pathology.  The colon was mobilized in the descending colon all the way up through and past the splenic flexure it was able to come down deep into the pelvis without having any tension. At this point dilators were used to dilate up  the distal: 233 mm. This point the anvil was placed in the distal colon and the pursestring suture placed around this using 2-0 Prolene. A colotomy was made in the mid descending colon measuring about 3 cm along the tinea and EEA stapler was placed through this and attached to the amble deep in the pelvis most was then fired to create the 33 mm circular stapler. Stapler was then opened and 2 round areas of colon were removed from the stapler.    The colotomy was then closed in 2 layers with a running 2-0 Vicryl suture in the inside layer and 3-0 silk Lembert sutures as a second layer closure. The bladder was then inspected with urology Dr. Sherryl Barters.  The bladder was infused with 300 mL of diluted methylene blue to try and identify the fistula site. No distinct hole was identified and no methylene blue was leaking from the bladder.  Dr. Sherryl Barters then who oversewed the bladder to cover the area of inflammation.   This time the abdomen was then irrigated out. The 2 JP drains were placed in the pelvis 1 down low in the pelvis over the colonic anastomosis. An omental flap was then used and placed over the top of the this JP drain and another JP drain was placed on top of the omental flap just below the bladder.  At this point due to the amount of inflammation and the low pelvic anastomosis it was decided to perform an ileostomy to protect the anastomosis and allow the area to kill. The terminal ileum was identified and elevated up and reached well to the abdominal cavity fascia was grasped with Cooper's and a incision was made on the skin where the ileostomy was placed in the right lower quadrant. No charges dissect down through subcutaneous tissues into the fascia fascia was then cut with a cruciate incision and incised into the abdomen 3 fingers were able to be placed through this area without any difficulty. A Tanja Port was then used to grab the ileum and pull it through this area and a red rubber catheter was placed  underneath the mesentery of the small bowel as a bridge.    The fascia was then closed using 2-0 PDS in a running stitch from inferior and from superior. The skin was then irrigated out with saline and closed with staples. Attention was then turned to the ileostomy where the ileum was grasped and opened using electrocautery 2-0 Vicryl was then used to place stitches through the mucosa and down to the abdominal scan to [facility] the ostomy. This was done without any difficulty and an ostomy appliance was placed over the top of this.   Instrument, sponge, and needle counts were correct prior to abdominal closure and at the conclusion of the case. Sterile dressing was applied to the wound.  Findings: Large inflammation in the pelvis along with inflammatory fluid. Difficult dissection of the colon off of the bladder. Colorectal anastomosis with ileostomy  Estimated Blood Loss: 300 mL         Drains: 2 JP drains in pelvis         Total IV Fluids:         Specimens: sigmoid colon  Complications: None; patient tolerated the procedure well.         Disposition: PACU - hemodynamically stable.         Condition: stable

## 2015-09-12 NOTE — Progress Notes (Signed)
No events overnight Pain controlled No n/v  Filed Vitals:   09/11/15 1907 09/11/15 2040 09/12/15 0017 09/12/15 0458  BP: 119/79 123/68 102/65 100/58  Pulse: 88 76 79 69  Temp:  98.2 F (36.8 C) 97.8 F (36.6 C) 97.8 F (36.6 C)  TempSrc:      Resp: 20 20 20 20   Height:      Weight:      SpO2: 92% 96% 96% 94%   I/O last 3 completed shifts: In: 4429 [I.V.:4429] Out: 2090 [Urine:1455; Drains:185; Blood:450]   NAD Soft approp T. Ost p/p/p. Dressing intact. JP SS Foley clear yellow Ureteral catheter - clear yellow  CBC    Component Value Date/Time   WBC 10.1 09/12/2015 0418   WBC 8.1 09/13/2011 1052   RBC 4.18* 09/12/2015 0418   RBC 4.43 09/13/2011 1052   HGB 12.9* 09/12/2015 0418   HGB 13.8 09/13/2011 1052   HCT 37.3* 09/12/2015 0418   HCT 40.6 09/13/2011 1052   PLT 178 09/12/2015 0418   PLT 186 09/13/2011 1052   MCV 89.2 09/12/2015 0418   MCV 92 09/13/2011 1052   MCH 30.9 09/12/2015 0418   MCH 31.2 09/13/2011 1052   MCHC 34.6 09/12/2015 0418   MCHC 34.1 09/13/2011 1052   RDW 13.5 09/12/2015 0418   RDW 13.5 09/13/2011 1052   LYMPHSABS 1.2 09/02/2015 0931   LYMPHSABS 1.1 09/13/2011 1052   MONOABS 0.4 09/02/2015 0931   MONOABS 0.6 09/13/2011 1052   EOSABS 0.2 09/02/2015 0931   EOSABS 0.1 09/13/2011 1052   BASOSABS 0.1 09/02/2015 0931   BASOSABS 0.0 09/13/2011 1052    BMP Latest Ref Rng 09/12/2015 09/02/2015 12/31/2014  Glucose 65 - 99 mg/dL 865(H154(H) 94 846(N108(H)  BUN 6 - 20 mg/dL 12 12 17   Creatinine 0.61 - 1.24 mg/dL 6.291.03 5.280.97 4.131.00  Sodium 135 - 145 mmol/L 137 141 141  Potassium 3.5 - 5.1 mmol/L 4.1 4.9 4.8  Chloride 101 - 111 mmol/L 103 104 103  CO2 22 - 32 mmol/L 28 30 31   Calcium 8.9 - 10.3 mg/dL 8.0(L) 9.5 9.2    POD 1 cysto, blt ureteral stents, bladder closure by urology, colovesical fistula takedown/ileostomy/partial colectomy by gen surg. Doing well -continue foley. Will need to keep 14 days with cystogram prior to removal -continue JP drains. Ok for  removal from uro standpoint when 24 hr output < 100 cc. Currently 185 cc output -ureteral catheter removed this am.output was clear yellow. -diet per gen surg

## 2015-09-12 NOTE — Consult Note (Signed)
WOC ostomy consult note Stoma type/location: RLQ Ileostomy Stomal assessment/size: Pink and protruding.  Assessed through pouch.  Honeycomb midline dressing in place.  Peristomal assessment: Not assessed Treatment options for stomal/peristomal skin: None noted. Patient is groggy and in pain.  Would like to complete pouch change with mother at bedside.  Scheduled for tomorrow AM with patient and mother.  Output blood tinged liquid in pouch.  Ostomy pouching:  2pc. 2 1/4" pouch  Education provided: Chief Operating OfficerWritten materials provided. Will begin to read on self care points regarding ileostomy Enrolled patient in [employer] program: No WOC team will follow and remain available for patient medical and nursing teams.  Maple HudsonKaren Drevin Ortner RN BSN CWON Pager [phone]

## 2015-09-12 NOTE — Progress Notes (Signed)
50 yr old male POD#1 from colovesical fistula takedown with low pelvic anastomosis and ileostomy creation. Patient doing well states that pain is well-controlled with the regimen we have him home. Patient states that when he coughs that the pain is worse. Patient is able to go to 3000 on the IS with ease is complaining of coughing up some mucus and some slight wheezing. Patient denies passing any flatus.  Filed Vitals:   09/12/15 0849 09/12/15 1335  BP: 113/64 121/70  Pulse: 83 76  Temp: 98.6 F (37 C) 97.6 F (36.4 C)  Resp: 18 20   I/O last 3 completed shifts: In: 5628 [I.V.:5628] Out: 3005 [Urine:1805; Drains:225; Stool:525; Blood:450]     PE:  Gen: NAD  Cardio: RRR Res: expiratory wheezing bilateral but no rales or rhonchi Abd: soft, slightly distended, incision c/d/i, JP in bilateral lower quadrants, both serosanginous Ext: 2+pulses, no edema  CBC Latest Ref Rng 09/12/2015 09/02/2015 12/31/2014  WBC 3.8 - 10.6 K/uL 10.1 4.6 6.3  Hemoglobin 13.0 - 18.0 g/dL 12.9(L) 16.4 14.7  Hematocrit 40.0 - 52.0 % 37.3(L) 47.5 44.2  Platelets 150 - 440 K/uL 178 211 246   CMP Latest Ref Rng 09/12/2015 09/02/2015 12/31/2014  Glucose 65 - 99 mg/dL 161(W154(H) 94 960(A108(H)  BUN 6 - 20 mg/dL 12 12 17   Creatinine 0.61 - 1.24 mg/dL 5.401.03 9.810.97 1.911.00  Sodium 135 - 145 mmol/L 137 141 141  Potassium 3.5 - 5.1 mmol/L 4.1 4.9 4.8  Chloride 101 - 111 mmol/L 103 104 103  CO2 22 - 32 mmol/L 28 30 31   Calcium 8.9 - 10.3 mg/dL 8.0(L) 9.5 9.2  Total Protein 6.5 - 8.1 g/dL - 8.0 7.4  Total Bilirubin 0.3 - 1.2 mg/dL - 0.6 4.7(W0.2(L)  Alkaline Phos 38 - 126 U/L - 56 54  AST 15 - 41 U/L - 17 15  ALT 17 - 63 U/L - 13(L) 11(L)    A/P:  50 yr old POD#1 from colovesical fistula takedown with low pelvic anastomosis and ileostomy creation. Pain: Continue tylenol, toradol and flexeril scheduled with prn oxycodone and dilaudid iv  Res: Started Albuterol prn, continue good pumomary toilet with the IS GI: Continue clear liquid diet  until ileostomy has good output, Appreciate WOCN help with new ostomy teaching, continue JP drains GU: Ureteral stent removed today, still some bloody urine, and closely monitoring urine output, will keep foley for 14 days  Encourage ambulation, patient getting up to walk in nurses station today

## 2015-09-12 NOTE — Progress Notes (Signed)
Pt ambulated around nurse's station without difficulty. 

## 2015-09-13 DIAGNOSIS — N321 Vesicointestinal fistula: Secondary | ICD-10-CM

## 2015-09-13 NOTE — Consult Note (Signed)
WOC ostomy consult note Stoma type/location: RLQ Ileostomy with red rubber retention rod in place.  Stomal assessment/size: 1 5/8" round pink and well budded Peristomal assessment: Intact.  Midline honeycomb dressing in place.  Drain in place in RLQ (pubis) Treatment options for stomal/peristomal skin: Convex 1 piece pouch Output Blood tinged liquid at this time Ostomy pouching: 1pc. 2 " convex pouch.  Education provided: Mother and patient at bedside.  Pouch change performed. Explained rationale for red rubber catheter and that this would be removed and pouching would be a little easier at that time.  Mother and patient observed measuring, cutting to fit and applying pouch.  Practiced roll closure.  Encouraged to empty when 1/3 full.  2 pouches and written materials left at bedside.  Enrolled patient in [employer] program: No WOC team will follow.  Maple HudsonKaren Shelise Maron RN BSN CWON Pager [phone]

## 2015-09-13 NOTE — Progress Notes (Signed)
50 yr old male POD#2 from colovesical fistula takedown with low pelvic anastomosis and ileostomy creation.  Patient up and walking around nurses station today.  He states still feels bloated.  He did have some output from the ostomy.  HE denies any nausa or vomiting, tolerated liquids well.    Filed Vitals:   09/13/15 0613 09/13/15 1300  BP: 98/53 120/71  Pulse: 71 72  Temp: 98.5 F (36.9 C) 97.8 F (36.6 C)  Resp: 18 20   I/O last 3 completed shifts: In: 3862 [P.O.:720; I.V.:3142] Out: 4625.5 [Urine:3450; Drains:75.5; Stool:1100]     PE:  Gen: NAD  Cardio: RRR Res: expiratory wheezing bilateral but no rales or rhonchi Abd: soft, slightly distended, incision c/d/i, JP in bilateral lower quadrants, both serosanginous Ext: 2+pulses, no edema  CBC Latest Ref Rng 09/12/2015 09/02/2015 12/31/2014  WBC 3.8 - 10.6 K/uL 10.1 4.6 6.3  Hemoglobin 13.0 - 18.0 g/dL 12.9(L) 16.4 14.7  Hematocrit 40.0 - 52.0 % 37.3(L) 47.5 44.2  Platelets 150 - 440 K/uL 178 211 246   CMP Latest Ref Rng 09/12/2015 09/02/2015 12/31/2014  Glucose 65 - 99 mg/dL 191(Y154(H) 94 782(N108(H)  BUN 6 - 20 mg/dL 12 12 17   Creatinine 0.61 - 1.24 mg/dL 5.621.03 1.300.97 8.651.00  Sodium 135 - 145 mmol/L 137 141 141  Potassium 3.5 - 5.1 mmol/L 4.1 4.9 4.8  Chloride 101 - 111 mmol/L 103 104 103  CO2 22 - 32 mmol/L 28 30 31   Calcium 8.9 - 10.3 mg/dL 8.0(L) 9.5 9.2  Total Protein 6.5 - 8.1 g/dL - 8.0 7.4  Total Bilirubin 0.3 - 1.2 mg/dL - 0.6 7.8(I0.2(L)  Alkaline Phos 38 - 126 U/L - 56 54  AST 15 - 41 U/L - 17 15  ALT 17 - 63 U/L - 13(L) 11(L)    A/P:  50 yr old POD#2 from colovesical fistula takedown with low pelvic anastomosis and ileostomy creation. Pain: Continue tylenol, toradol and flexeril scheduled with prn oxycodone and dilaudid iv  Res: Started Albuterol prn, continue good pumomary toilet with the IS GI: Continue clear liquid diet until ileostomy has good output, Appreciate WOCN help with new ostomy teaching, continue JP drains GU:  will  keep foley for 14 days  Encourage ambulation, patient getting up to walk in nurses station today

## 2015-09-13 NOTE — Telephone Encounter (Signed)
Orders placed.

## 2015-09-13 NOTE — Telephone Encounter (Signed)
I need an order for the cystogram before i can do anything  Thanks, Peter DusterMichelle

## 2015-09-14 MED ORDER — NICOTINE 21 MG/24HR TD PT24
21.0000 mg | MEDICATED_PATCH | Freq: Every day | TRANSDERMAL | Status: DC
  Administered 2015-09-14 – 2015-09-15 (×2): 21 mg via TRANSDERMAL
  Filled 2015-09-14 (×2): qty 1

## 2015-09-14 NOTE — Progress Notes (Signed)
50 yr old male POD#3 from colovesical fistula takedown with low pelvic anastomosis and ileostomy creation.  Patient had leak from high output of ostomy as well as some pain control issues.  He was cleaned up and doing well now.  Honeycomb dressing removed due to soiling.  He took liquids in well, has been up adn walking well.     Filed Vitals:   09/13/15 2152 09/14/15 0537  BP: 103/64 114/67  Pulse: 76 78  Temp: 98.1 F (36.7 C) 98.4 F (36.9 C)  Resp: 17 16   I/O last 3 completed shifts: In: 3734.3 [P.O.:960; I.V.:2774.3] Out: 5085.5 [Urine:4000; Drains:60.5; Stool:1025] Total I/O In: 489.5 [I.V.:489.5] Out: 445 [Urine:425; Stool:20]   PE:  Gen: NAD  Cardio: RRR Res: ctab/l  Abd: soft, distension improved, incision c/d/i, ostomy pink and patent, JP in bilateral lower quadrants, both serosanginous Ext: 2+pulses, no edema  CBC Latest Ref Rng 09/12/2015 09/02/2015 12/31/2014  WBC 3.8 - 10.6 K/uL 10.1 4.6 6.3  Hemoglobin 13.0 - 18.0 g/dL 12.9(L) 16.4 14.7  Hematocrit 40.0 - 52.0 % 37.3(L) 47.5 44.2  Platelets 150 - 440 K/uL 178 211 246   CMP Latest Ref Rng 09/12/2015 09/02/2015 12/31/2014  Glucose 65 - 99 mg/dL 161(W154(H) 94 960(A108(H)  BUN 6 - 20 mg/dL 12 12 17   Creatinine 0.61 - 1.24 mg/dL 5.401.03 9.810.97 1.911.00  Sodium 135 - 145 mmol/L 137 141 141  Potassium 3.5 - 5.1 mmol/L 4.1 4.9 4.8  Chloride 101 - 111 mmol/L 103 104 103  CO2 22 - 32 mmol/L 28 30 31   Calcium 8.9 - 10.3 mg/dL 8.0(L) 9.5 9.2  Total Protein 6.5 - 8.1 g/dL - 8.0 7.4  Total Bilirubin 0.3 - 1.2 mg/dL - 0.6 4.7(W0.2(L)  Alkaline Phos 38 - 126 U/L - 56 54  AST 15 - 41 U/L - 17 15  ALT 17 - 63 U/L - 13(L) 11(L)    A/P:  50 yr old POD#3 from colovesical fistula takedown with low pelvic anastomosis and ileostomy creation. Pain: Continue tylenol, toradol and flexeril scheduled with prn oxycodone and dilaudid iv  Res: Started Albuterol prn, continue good pumomary toilet with the IS GI: advance to regular diet, continue JP drains,  appreciate WOCN help GU:  will keep foley for 14 days  Encourage ambulation, patient getting up to walk in nurses station today

## 2015-09-14 NOTE — Progress Notes (Signed)
RN Ree KidaJack to be taking over as Engineer, building servicesprimary RN for pt (d/t pt on isolation, and previous RN Casimiro NeedleMichael had 2 isolation rooms); report given to [employer] Br[employer] Jack at bedside

## 2015-09-14 NOTE — Progress Notes (Signed)
09/14/2015  10:45  Spoke to pt and family.  Pt and family were upset, feeling ignored and concerned about ileostomy contents leaking on to surgical incision.  I discussed communication with thm and apologized.  Made charge nurse aware of the situation as well.  Called Dr. Orvis BrillLoflin who agreed to come see the pt.  Dr. Orvis BrillLoflin reassured pt and removed dressing over midline incision.  I cleansed periostomal area and midline incision area with soap, water, and CHG wipes.  Pt calmed down and is resting.  Will continue to monitor and assess pt, especially in regards to need to empty ileostomy and need for pain meds, as well as continuing timely responses and communication. Bradly Chrisougherty, Lichelle Viets E, RN

## 2015-09-15 LAB — CREATININE, SERUM
Creatinine, Ser: 0.99 mg/dL (ref 0.61–1.24)
GFR calc non Af Amer: >60 mL/min (ref >60)

## 2015-09-15 MED ORDER — CYCLOBENZAPRINE HCL 10 MG PO TABS: 10.0000 mg | ORAL_TABLET | Freq: Three times a day (TID) | ORAL | Status: DC | PRN

## 2015-09-15 MED ORDER — OXYCODONE-ACETAMINOPHEN 7.5-325 MG PO TABS: 2.0000 | ORAL_TABLET | ORAL | Status: DC | PRN

## 2015-09-15 NOTE — Discharge Summary (Signed)
Physician Discharge Summary  Patient ID: Octaviano BattyMark A Diffley MRN: [id number] DOB/AGE: 50/01/1966 50 y.o.  Admit date: 09/11/2015 Discharge date: 09/15/2015  Admission Diagnoses: Colovesical fistula  Discharge Diagnoses:  Active Problems:   Colovesical fistula   Discharged Condition: good  Hospital Course: 50 yr old male POD#4 Colovesical fistula repair with colorectal anastomosis and protecting ileostomy. Patient has been up and walking around nurses station.  Patient states pain well controlled with PO meds, has not needed IV narcotic in 2 days.  Patient tolerating a regular diet well with good output from the ileostomy.  Patient has been taught ileostomy care and had this reiterated with nursing and helped with changes.  Patient states that he feels comfortable and that his mom, brother and friend have learned and will be at home with him too.  He and family have learned to measure, empty and recharge the JP drain, he has been instructed to do this once daily.  He understand he will have the foley catheter at home as well until 2 weeks out, Dr. Jorja LoaBudzyn's office is supposed to call next week to schedule his appointment.  He is to call my office on Monday AM to get an appointment for 3/15 or 17th for drain removal.  Also setting up home health nurse to provide check for ostomy, drain and foley in house, being set up but should send someone on Monday or Tuesday.   Consults: urology  Significant Diagnostic Studies: labs: WNL  Treatments: surgery: Colovesical Fistula takedown with colorectal anastomosis and protecting ileostomy, Dr Orvis BrillLoflin, Colovesical repair, cystoscopy and ureteral stent placement Dr. Sherryl BartersBudzyn   Discharge Exam: Blood pressure 116/72, pulse 73, temperature 97.7 F (36.5 C), temperature source Oral, resp. rate 18, height 5\' 10"  (1.778 m), weight 212 lb (96.163 kg), SpO2 98 %. General appearance: alert, cooperative and no distress GI: soft, non-distended, incision c/d/i no erythema,  ileostomy pink and patent with air and stool in bag, JP in LLQ with serosanginous output Extremities: extremities normal, atraumatic, no cyanosis or edema  Disposition: 01-Home or Self Care  Discharge Instructions    Call MD for:  difficulty breathing, headache or visual disturbances    Complete by:  As directed      Call MD for:  persistant nausea and vomiting    Complete by:  As directed      Call MD for:  redness, tenderness, or signs of infection (pain, swelling, redness, odor or green/yellow discharge around incision site)    Complete by:  As directed      Call MD for:  severe uncontrolled pain    Complete by:  As directed      Call MD for:  temperature >100.4    Complete by:  As directed      Change dressing (specify)    Complete by:  As directed   Place dry dressing to wound once daily if draining, no dressing needed if no drainage     Diet general    Complete by:  As directed      Discharge instructions    Complete by:  As directed   Measure, empty and recharge the JP drain daily or when full Empty foley catheter at least once daily or when full Empty ostomy bag when 1/3 full and continue burping air out of bag throughout the day to decrease leaking, change pouch around once weekly or if having leak around area     Driving Restrictions    Complete by:  As directed  No driving while on prescription pain medication     Increase activity slowly    Complete by:  As directed      Lifting restrictions    Complete by:  As directed   No lifting over 15lbs or strenuous activity for 6 weeks     May shower / Bathe    Complete by:  As directed             Medication List    TAKE these medications        bacitracin ointment  Apply topically daily. Place in both nares daily     cyclobenzaprine 10 MG tablet  Commonly known as:  FLEXERIL  Take 1 tablet (10 mg total) by mouth 3 (three) times daily as needed for muscle spasms.     oxyCODONE-acetaminophen 7.5-325 MG tablet   Commonly known as:  PERCOCET  Take 2 tablets by mouth every 4 (four) hours as needed for severe pain.     varenicline 0.5 MG X 11 & 1 MG X 42 tablet  Commonly known as:  CHANTIX PAK  Take one 0.5 mg tablet by mouth once daily for 3 days, then increase to one 0.5 mg tablet twice daily for 4 days, then increase to one 1 mg tablet twice daily.           Follow-up Information    Follow up with Hildred Laser, MD In 2 weeks.   Specialty:  Urology   Why:  with xray prior. Office will call to schedule   Contact information:   [address] [place] [address] [phone]       Schedule an appointment as soon as possible for a visit with Gladis Riffle, MD.   Specialty:  Surgery   Why:  f/u for drain removal next week, call office on Monday to set appointment    Contact information:   [address] [place] [address] [phone]       Signed: Gladis Riffle 09/15/2015, 2:35 PM

## 2015-09-15 NOTE — Care Management Note (Addendum)
Case Management Note  Patient Details  Name: Peter [name] MRN: [id number] Date of Birth: 12/30/1965  Subjective/Objective:       Mr Peter [name] has [employer] insurance which is managed care by [employer]. This Clinical research associatewriter called [employer] ph:[phone] and gave all home health RN request information to TiceStephanie at [employer]. [employer] will set up home health RN services with a local provider. All requested information faxed to [employer] at Fax: [phone]. The [employer]  Intake number for this claim is [id number]. Dr Orvis BrillLoflin was updated that [employer] is handling the home health RN referral.             Action/Plan:   Expected Discharge Date:                  Expected Discharge Plan:     In-House Referral:     Discharge planning Services     Post Acute Care Choice:    Choice offered to:     DME Arranged:    DME Agency:     HH Arranged:    HH Agency:     Status of Service:     Medicare Important Message Given:    Date Medicare IM Given:    Medicare IM give by:    Date Additional Medicare IM Given:    Additional Medicare Important Message give by:     If discussed at Long Length of Stay Meetings, dates discussed:    Additional Comments:  Kimberlye Dilger A, RN 09/15/2015, 1:28 PM

## 2015-09-15 NOTE — Discharge Instructions (Signed)
*  Monitor for signs of infection such as fever, drainage from your incision, increasing redness/swelling at your incision, and/or increasing pain not relieved with the prescribed pain medications.  *Take all medications as prescribed. *Drink plenty of fluids.  *Notify your doctor if the prescribed pain medications are not effective for pain contro. *Notify your doctor with any questions or concerns.

## 2015-09-15 NOTE — Progress Notes (Signed)
IV was removed. Discharge instructions, follow-up appointments, and prescriptions were provided to the pt. The pt and girlfriend were shown how to properly empty the JP drain, foley, and ileostomy bag. All questions answered. The pt was taken downstairs via wheelchair by NT. An extra ileostomy and overnight bag were given to the pt.

## 2015-09-16 ENCOUNTER — Telehealth: Payer: Self-pay | Admitting: Surgery

## 2015-09-16 LAB — SURGICAL PATHOLOGY

## 2015-09-16 NOTE — Telephone Encounter (Signed)
Left voice message for patient to call and schedule appointment for 1 week follow up for drain removal

## 2015-09-17 NOTE — Telephone Encounter (Signed)
Patient has a follow up on 10-04-15 and will be schd for his cystogram [facility] to that [facility]    michelle

## 2015-09-18 ENCOUNTER — Telehealth: Payer: Self-pay | Admitting: Surgery

## 2015-09-18 NOTE — Telephone Encounter (Addendum)
Call returned to [facility] at this time to find out exactly what patient is using for supplies so that RX can be written and faxed in.  Lawson FiscalLori, the [facility] nurse that has been out to see the patient, states that patient is currently using a 1.5" Convex 1 piece ostomy pouch by [employer]. [phone]

## 2015-09-18 NOTE — Telephone Encounter (Signed)
Home health nurse went to patient's house to do ostomy teaching with the patient. Patient only has 1 bag left. Per Baird Lyonsasey, he needs to have a Rx faxed to [facility] faxed # [phone]. They will need all the supplies that he will need (type of bag, if he needs paste, powder, adhesive removal or skin prep). They can expedite this quicker if there is an intake number attached to the cover sheet which is [id number].  If you have any questions please call casey @ [phone] ext [phone].

## 2015-09-19 ENCOUNTER — Telehealth: Payer: Self-pay

## 2015-09-19 MED ORDER — OSTOMY SUPPLIES POUCH MISC
Status: DC
Start: 2015-09-19 — End: 2015-11-19

## 2015-09-19 NOTE — Telephone Encounter (Signed)
Patient called stating that he is needing a refill on his pain medication. I asked how many and how often he was taking them. He stated that he was taking 2 capsules every four hours as directed because he is in a lot pain. I told him that he could take ibuprofen 600 MG every 6 hours for the pain. However, patient insisted for pain medications because he has taken Ibuprofen before and this would not help him at all. Then I told him that I would get in contact with Dr. Orvis BrillLoflin and ask her for a refill. Patient understood.

## 2015-09-19 NOTE — Telephone Encounter (Signed)
FMLA Forms were filled out and faxed for [employer] and his employer.

## 2015-09-19 NOTE — Telephone Encounter (Signed)
Order in letters. Faxed to [facility] at this time with the intake number on the prescription.

## 2015-09-19 NOTE — Telephone Encounter (Signed)
Called patient back to let him know that Dr. Orvis BrillLoflin would not prescribe him any more pain medication. However, patient will be coming in tomorrow in the morning to be seen by Dr. Orvis BrillLoflin. I explained to the patient that once Dr. Orvis BrillLoflin sees him on his post-op visit, then if she thinks that he needs more pain medication then she will do so. In the meantime, I told patient to take Ibuprofen 600 MG to help with the pain and inflammation. I also told patient that if he develops a fever, nausea, vomiting and redness on his incisions, to please go to the emergency room. Patient understood. I also made sure that patient was not having any other symptoms but soreness at bedtime.

## 2015-09-20 ENCOUNTER — Encounter: Payer: Self-pay | Admitting: *Deleted

## 2015-09-20 ENCOUNTER — Encounter: Payer: Self-pay | Admitting: Surgery

## 2015-09-20 ENCOUNTER — Encounter: Payer: Self-pay | Admitting: Radiology

## 2015-09-20 ENCOUNTER — Ambulatory Visit
Admission: RE | Admit: 2015-09-20 | Payer: Managed Care, Other (non HMO) | Source: Ambulatory Visit | Admitting: General Surgery

## 2015-09-20 ENCOUNTER — Inpatient Hospital Stay: Payer: Managed Care, Other (non HMO)

## 2015-09-20 ENCOUNTER — Inpatient Hospital Stay
Admission: AD | Admit: 2015-09-20 | Discharge: 2015-09-22 | DRG: 690 | Disposition: A | Discharge status: Home / Self Care | Payer: Managed Care, Other (non HMO) | Source: Ambulatory Visit | Attending: Surgery | Admitting: Surgery

## 2015-09-20 ENCOUNTER — Ambulatory Visit (INDEPENDENT_AMBULATORY_CARE_PROVIDER_SITE_OTHER): Payer: Managed Care, Other (non HMO) | Admitting: Surgery

## 2015-09-20 VITALS — BP 109/75 | HR 124 | Temp 98.3°F | Ht 70.0 in | Wt 196.4 lb

## 2015-09-20 DIAGNOSIS — N390 Urinary tract infection, site not specified: Secondary | ICD-10-CM | POA: Diagnosis present

## 2015-09-20 DIAGNOSIS — M13811 Other specified arthritis, right shoulder: Secondary | ICD-10-CM | POA: Diagnosis present

## 2015-09-20 DIAGNOSIS — J449 Chronic obstructive pulmonary disease, unspecified: Secondary | ICD-10-CM | POA: Diagnosis present

## 2015-09-20 DIAGNOSIS — F17210 Nicotine dependence, cigarettes, uncomplicated: Secondary | ICD-10-CM | POA: Diagnosis present

## 2015-09-20 DIAGNOSIS — Z801 Family history of malignant neoplasm of trachea, bronchus and lung: Secondary | ICD-10-CM | POA: Diagnosis not present

## 2015-09-20 DIAGNOSIS — M13812 Other specified arthritis, left shoulder: Secondary | ICD-10-CM | POA: Diagnosis present

## 2015-09-20 DIAGNOSIS — M1388 Other specified arthritis, other site: Secondary | ICD-10-CM | POA: Diagnosis present

## 2015-09-20 DIAGNOSIS — Z825 Family history of asthma and other chronic lower respiratory diseases: Secondary | ICD-10-CM | POA: Diagnosis not present

## 2015-09-20 DIAGNOSIS — N321 Vesicointestinal fistula: Secondary | ICD-10-CM

## 2015-09-20 DIAGNOSIS — K5732 Diverticulitis of large intestine without perforation or abscess without bleeding: Secondary | ICD-10-CM

## 2015-09-20 DIAGNOSIS — Z833 Family history of diabetes mellitus: Secondary | ICD-10-CM

## 2015-09-20 DIAGNOSIS — Z8042 Family history of malignant neoplasm of prostate: Secondary | ICD-10-CM | POA: Diagnosis not present

## 2015-09-20 DIAGNOSIS — K219 Gastro-esophageal reflux disease without esophagitis: Secondary | ICD-10-CM | POA: Diagnosis present

## 2015-09-20 LAB — URINALYSIS COMPLETE WITH MICROSCOPIC (ARMC ONLY)
Bilirubin Urine: NEGATIVE
Glucose, UA: NEGATIVE mg/dL
KETONES UR: NEGATIVE mg/dL
NITRITE: POSITIVE — AB
PROTEIN: NEGATIVE mg/dL
Specific Gravity, Urine: 1.012 (ref 1.005–1.030)
pH: 6 (ref 5.0–8.0)

## 2015-09-20 LAB — COMPREHENSIVE METABOLIC PANEL
ALT: 52 U/L (ref 17–63)
AST: 48 U/L — AB (ref 15–41)
Albumin: 3.8 g/dL (ref 3.5–5.0)
Alkaline Phosphatase: 137 U/L — ABNORMAL HIGH (ref 38–126)
Anion gap: 10 (ref 5–15)
BILIRUBIN TOTAL: 0.6 mg/dL (ref 0.3–1.2)
BUN: 15 mg/dL (ref 6–20)
CALCIUM: 9.5 mg/dL (ref 8.9–10.3)
CO2: 27 mmol/L (ref 22–32)
CREATININE: 0.97 mg/dL (ref 0.61–1.24)
Chloride: 95 mmol/L — ABNORMAL LOW (ref 101–111)
GFR calc Af Amer: >60 mL/min (ref >60)
GLUCOSE: 110 mg/dL — AB (ref 65–99)
Potassium: 5 mmol/L (ref 3.5–5.1)
Sodium: 132 mmol/L — ABNORMAL LOW (ref 135–145)
Total Protein: 8.6 g/dL — ABNORMAL HIGH (ref 6.5–8.1)

## 2015-09-20 LAB — CBC WITH DIFFERENTIAL/PLATELET
BASOS PCT: 1 %
Basophils Absolute: 0 10*3/uL (ref 0–0.1)
EOS ABS: 0.1 10*3/uL (ref 0–0.7)
EOS PCT: 1 %
HEMATOCRIT: 43.8 % (ref 40.0–52.0)
Hemoglobin: 14.8 g/dL (ref 13.0–18.0)
Lymphocytes Relative: 15 %
Lymphs Abs: 1.3 10*3/uL (ref 1.0–3.6)
MCH: 29.8 pg (ref 26.0–34.0)
MCHC: 33.8 g/dL (ref 32.0–36.0)
MCV: 88.1 fL (ref 80.0–100.0)
MONO ABS: 0.7 10*3/uL (ref 0.2–1.0)
MONOS PCT: 8 %
Neutro Abs: 6.7 10*3/uL — ABNORMAL HIGH (ref 1.4–6.5)
Neutrophils Relative %: 75 %
Platelets: 378 10*3/uL (ref 150–440)
RBC: 4.97 MIL/uL (ref 4.40–5.90)
RDW: 14 % (ref 11.5–14.5)
WBC: 8.8 10*3/uL (ref 3.8–10.6)

## 2015-09-20 MED ORDER — INFLUENZA VAC SPLIT QUAD 0.5 ML IM SUSY
0.5000 mL | PREFILLED_SYRINGE | INTRAMUSCULAR | Status: DC
  Filled 2015-09-20: qty 0.5

## 2015-09-20 MED ORDER — KETOROLAC TROMETHAMINE 30 MG/ML IJ SOLN
30.0000 mg | Freq: Four times a day (QID) | INTRAMUSCULAR | Status: DC
  Administered 2015-09-20 – 2015-09-22 (×9): 30 mg via INTRAVENOUS
  Filled 2015-09-20 (×9): qty 1

## 2015-09-20 MED ORDER — ONDANSETRON HCL 4 MG/2ML IJ SOLN: 4.0000 mg | Freq: Four times a day (QID) | INTRAMUSCULAR | Status: DC | PRN

## 2015-09-20 MED ORDER — CYCLOBENZAPRINE HCL 10 MG PO TABS
10.0000 mg | ORAL_TABLET | Freq: Three times a day (TID) | ORAL | Status: DC | PRN
  Administered 2015-09-20: 10 mg via ORAL
  Filled 2015-09-20: qty 1

## 2015-09-20 MED ORDER — ENOXAPARIN SODIUM 40 MG/0.4ML ~~LOC~~ SOLN
40.0000 mg | SUBCUTANEOUS | Status: DC
  Administered 2015-09-20 – 2015-09-21 (×2): 40 mg via SUBCUTANEOUS
  Filled 2015-09-20 (×2): qty 0.4

## 2015-09-20 MED ORDER — HYDROMORPHONE HCL 1 MG/ML IJ SOLN
0.5000 mg | INTRAMUSCULAR | Status: DC | PRN
  Administered 2015-09-21: 0.5 mg via INTRAVENOUS
  Filled 2015-09-20: qty 1

## 2015-09-20 MED ORDER — PANTOPRAZOLE SODIUM 40 MG IV SOLR
40.0000 mg | Freq: Every day | INTRAVENOUS | Status: DC
  Administered 2015-09-20 – 2015-09-21 (×2): 40 mg via INTRAVENOUS
  Filled 2015-09-20 (×2): qty 40

## 2015-09-20 MED ORDER — CIPROFLOXACIN IN D5W 400 MG/200ML IV SOLN
400.0000 mg | Freq: Two times a day (BID) | INTRAVENOUS | Status: DC
  Administered 2015-09-20 – 2015-09-22 (×4): 400 mg via INTRAVENOUS
  Filled 2015-09-20 (×5): qty 200

## 2015-09-20 MED ORDER — IOHEXOL 240 MG/ML SOLN
25.0000 mL | INTRAMUSCULAR | Status: AC
Start: 2015-09-20 — End: 2015-09-20
  Administered 2015-09-20 (×2): 25 mL via ORAL

## 2015-09-20 MED ORDER — HYDRALAZINE HCL 20 MG/ML IJ SOLN: 10.0000 mg | INTRAMUSCULAR | Status: DC | PRN

## 2015-09-20 MED ORDER — DEXTROSE IN LACTATED RINGERS 5 % IV SOLN
INTRAVENOUS | Status: DC
  Administered 2015-09-20 – 2015-09-22 (×6): via INTRAVENOUS

## 2015-09-20 MED ORDER — ONDANSETRON 8 MG PO TBDP: 4.0000 mg | ORAL_TABLET | Freq: Four times a day (QID) | ORAL | Status: DC | PRN

## 2015-09-20 MED ORDER — OXYCODONE-ACETAMINOPHEN 7.5-325 MG PO TABS
2.0000 | ORAL_TABLET | ORAL | Status: DC | PRN
  Administered 2015-09-20 – 2015-09-22 (×4): 2 via ORAL
  Filled 2015-09-20 (×4): qty 2

## 2015-09-20 MED ORDER — IOHEXOL 300 MG/ML  SOLN
100.0000 mL | Freq: Once | INTRAMUSCULAR | Status: AC | PRN
  Administered 2015-09-20: 100 mL via INTRAVENOUS

## 2015-09-20 MED ORDER — METRONIDAZOLE IN NACL 5-0.79 MG/ML-% IV SOLN
500.0000 mg | Freq: Three times a day (TID) | INTRAVENOUS | Status: DC
  Administered 2015-09-20 – 2015-09-22 (×6): 500 mg via INTRAVENOUS
  Filled 2015-09-20 (×8): qty 100

## 2015-09-20 NOTE — Progress Notes (Signed)
Visit with patient to go over results of today's labs and images. Discussed with patient that today's images did not show a new onset of abscess however his urinalysis did show evidence of possible urinary tract infection. Patient states he is feeling somewhat better since being started on IV fluids but continues to have pain in his pelvis. Plan to start IV antibiotics and discuss patient be okay for him to have clear liquid diet. Will follow-up labs in the a.m.  Ricarda Frameharles Natavia Sublette, MD FACS General Surgeon [facility]

## 2015-09-20 NOTE — Progress Notes (Signed)
Peter [name] is an 50 y.o. male.  Chief Complaint: fever, chills, nausea and abdodminal Pain.  HPI: 50 year old male who is postoperative day 8 from a Colovesical fistula repair with ileostomy. Patient states that over the past couple of days he has been having worsening pain in his abdomen, nausea, fever and chills. Patient states that he's been emptying his ileostomy bag about 3-4 times a day and patient has been emptying his Foley catheter about 3-4 times a day. Patient states that he's been keeping up with his fluids well and his urine has been light yellow and has not been dark. Patient states that he is overall feels fatigued and worse with worsening pain. Patient has had good output out of the ileostomy but states that his pain medicines now do not seem to be controlling the pain. Patient's pain is in the lower pelvis area and is constant and is not relieved by the by mouth pain medicines patient states it is worse when he is moving around and that nothing so far has made it better. Patient also has complains of numbness in his right thigh for the past 2 days. Patient denies pain in this area but states is numb but only in the thigh not in toes.   Past Medical History  Diagnosis Date  . GERD (gastroesophageal reflux disease)   . Generalized headaches   . Rectal pain   . Thrombosed hemorrhoids   . Diverticulitis   . COPD (chronic obstructive pulmonary disease) ([place])   . Arthritis     neck, shoulders    Past Surgical History  Procedure Laterality Date  . Cholecystectomy  1989  . Tonsillectomy  1970  . Appendectomy  1988  . Colonoscopy with propofol N/A 08/16/2015    Procedure: COLONOSCOPY WITH PROPOFOL; Surgeon: Lucilla Lame, MD; Location: [facility]; Service: Endoscopy; Laterality: N/A;  . Polypectomy  08/16/2015    Procedure: POLYPECTOMY; Surgeon: Lucilla Lame, MD; Location: [facility]; Service:  Endoscopy;;  . Laparoscopic partial colectomy N/A 09/11/2015    Procedure: LAPAROSCOPIC PARTIAL COLECTOMY Possible Open, possible ostomy, repair of colovesical fistula Cystoscopy and stent placement, repair of colovesical fistula with Dr. Pilar Jarvis as well ; Surgeon: Hubbard Robinson, MD; Location: [facility]; Service: General; Laterality: N/A;  . Cystoscopy with stent placement Bilateral 09/11/2015    Procedure: CYSTOSCOPY WITH STENT PLACEMENT; Surgeon: Nickie Retort, MD; Location: [facility]; Service: Urology; Laterality: Bilateral;  . Bladder repair N/A 09/11/2015    Procedure: BLADDER REPAIR; Surgeon: Nickie Retort, MD; Location: [facility]; Service: Urology; Laterality: N/A;    Family History  Problem Relation Age of Onset  . Cholecystitis Mother   . Lung cancer Father   . Emphysema Paternal Grandmother   . Diabetes Paternal Grandmother   . Prostate cancer Maternal Uncle    Social History:  reports that he has been smoking Cigarettes. He has a 30 pack-year smoking history. He has never used smokeless tobacco. He reports that he does not drink alcohol or use illicit drugs.  Allergies: No Known Allergies  No prescriptions prior to admission     Lab Results Last 48 Hours    No results found for this or any previous visit (from the past 48 hour(s)).    Imaging Results (Last 48 hours)    No results found.    Review of Systems  Constitutional: Positive for fever, chills, malaise/fatigue and diaphoresis.  HENT: Negative for congestion and sore throat.  Respiratory: Negative for cough, sputum production, shortness of  breath and wheezing.  Cardiovascular: Negative for chest pain, claudication and leg swelling.  Gastrointestinal: Positive for nausea and abdominal pain. Negative for vomiting, diarrhea, constipation and blood in stool.  Genitourinary: Negative for dysuria, urgency, hematuria and flank pain.   Musculoskeletal: Negative for back pain and joint pain.  Skin: Negative for itching and rash.  Neurological: Positive for weakness and headaches. Negative for dizziness and loss of consciousness.  Psychiatric/Behavioral: Negative for depression. The patient is not nervous/anxious.  All other systems reviewed and are negative.   There were no vitals taken for this visit. Physical Exam  Vitals reviewed. Constitutional: He is oriented to person, place, and time. He appears well-developed and well-nourished. No distress.  HENT:  Head: Normocephalic and atraumatic.  Right Ear: External ear normal.  Left Ear: External ear normal.  Nose: Nose normal.  Mouth/Throat: Oropharynx is clear and moist. No oropharyngeal exudate.  Eyes: Conjunctivae and EOM are normal. Pupils are equal, round, and reactive to light. No scleral icterus.  Neck: Normal range of motion. Neck supple. No tracheal deviation present.  Cardiovascular: Regular rhythm, normal heart sounds and intact distal pulses. Exam reveals no gallop and no friction rub.  No murmur heard. tachycardic  Respiratory: Effort normal and breath sounds normal. No respiratory distress. He has no wheezes. He has no rales.  GI: Soft. He exhibits no distension. There is tenderness. There is no rebound and no guarding.  Incision clean and dry with staples intact, no erythema or drainage, ileostomy with bridge, pink patent with air and stool in the bag, JP drain with minmal serous output some erythema and minimal drainage around his drain site in LLQ. Appropriately tender.  Musculoskeletal: Normal range of motion. He exhibits no edema or tenderness.  States numbness in right lateral thigh, no numbness elsewhere  Neurological: He is alert and oriented to person, place, and time. No cranial nerve deficit.  Skin: Skin is warm and dry. No rash noted. No erythema. No pallor.  Psychiatric: He has a normal mood and affect. His behavior is normal.  Judgment and thought content normal.     Assessment/Plan 50 year old postoperative day 8 from colovesical fistula takedown patient comes in today having fever chills nausea and increased pain in his abdomen. Overall patient looks dehydrated and appears to be worsening. I will met him to the hospital start him on IV fluids get labs urinalysis and culture and will get a CAT scan of his abdomen and pelvis to ensure no abscess formation. I will let my partner Dr. Adonis Huguenin know as well will contact Dr. Pilar Jarvis to let him know.

## 2015-09-20 NOTE — H&P (Signed)
Peter [name] is an 50 y.o. male.   Chief Complaint: fever, chills, nausea and abdodminal Pain.  HPI: 50 year old male who is postoperative day 8 from a Colovesical fistula repair with ileostomy.  Patient states that over the past couple of days he has been having worsening pain in his abdomen, nausea, fever and chills. Patient states that he's been emptying his ileostomy bag about 3-4 times a day and patient has been emptying his Foley catheter about 3-4 times a day. Patient states that he's been keeping up with his fluids well and his urine has been light yellow and has not been dark. Patient states that he is overall feels fatigued and worse with worsening pain. Patient has had good output out of the ileostomy but states that his pain medicines now do not seem to be controlling the pain. Patient's pain is in the lower pelvis area and is constant and is not relieved by the by mouth pain medicines patient states it is worse when he is moving around and that nothing so far has made it better.  Patient also has complains of numbness in his right thigh for the past 2 days. Patient denies pain in this area but states is numb but only in the thigh not in toes.   Past Medical History  Diagnosis Date  . GERD (gastroesophageal reflux disease)   . Generalized headaches   . Rectal pain   . Thrombosed hemorrhoids   . Diverticulitis   . COPD (chronic obstructive pulmonary disease) ([place])   . Arthritis     neck, shoulders    Past Surgical History  Procedure Laterality Date  . Cholecystectomy  1989  . Tonsillectomy  1970  . Appendectomy  1988  . Colonoscopy with propofol N/A 08/16/2015    Procedure: COLONOSCOPY WITH PROPOFOL;  Surgeon: Lucilla Lame, MD;  Location: [facility];  Service: Endoscopy;  Laterality: N/A;  . Polypectomy  08/16/2015    Procedure: POLYPECTOMY;  Surgeon: Lucilla Lame, MD;  Location: [facility] [facility];  Service: Endoscopy;;  . Laparoscopic partial colectomy N/A 09/11/2015     Procedure: LAPAROSCOPIC PARTIAL COLECTOMY Possible Open, possible ostomy, repair of colovesical fistula Cystoscopy and stent placement, repair of colovesical fistula with Dr. Pilar Jarvis as well ;  Surgeon: Hubbard Robinson, MD;  Location: [facility];  Service: General;  Laterality: N/A;  . Cystoscopy with stent placement Bilateral 09/11/2015    Procedure: CYSTOSCOPY WITH STENT PLACEMENT;  Surgeon: Nickie Retort, MD;  Location: [facility];  Service: Urology;  Laterality: Bilateral;  . Bladder repair N/A 09/11/2015    Procedure: BLADDER REPAIR;  Surgeon: Nickie Retort, MD;  Location: [facility];  Service: Urology;  Laterality: N/A;    Family History  Problem Relation Age of Onset  . Cholecystitis Mother   . Lung cancer Father   . Emphysema Paternal Grandmother   . Diabetes Paternal Grandmother   . Prostate cancer Maternal Uncle    Social History:  reports that he has been smoking Cigarettes.  He has a 30 pack-year smoking history. He has never used smokeless tobacco. He reports that he does not drink alcohol or use illicit drugs.  Allergies: No Known Allergies  No prescriptions prior to admission    No results found for this or any previous visit (from the past 48 hour(s)). No results found.  Review of Systems  Constitutional: Positive for fever, chills, malaise/fatigue and diaphoresis.  HENT: Negative for congestion and sore throat.   Respiratory: Negative for cough, sputum production,  shortness of breath and wheezing.   Cardiovascular: Negative for chest pain, claudication and leg swelling.  Gastrointestinal: Positive for nausea and abdominal pain. Negative for vomiting, diarrhea, constipation and blood in stool.  Genitourinary: Negative for dysuria, urgency, hematuria and flank pain.  Musculoskeletal: Negative for back pain and joint pain.  Skin: Negative for itching and rash.  Neurological: Positive for weakness and headaches. Negative for dizziness and loss of consciousness.   Psychiatric/Behavioral: Negative for depression. The patient is not nervous/anxious.   All other systems reviewed and are negative.   There were no vitals taken for this visit. Physical Exam  Vitals reviewed. Constitutional: He is oriented to person, place, and time. He appears well-developed and well-nourished. No distress.  HENT:  Head: Normocephalic and atraumatic.  Right Ear: External ear normal.  Left Ear: External ear normal.  Nose: Nose normal.  Mouth/Throat: Oropharynx is clear and moist. No oropharyngeal exudate.  Eyes: Conjunctivae and EOM are normal. Pupils are equal, round, and reactive to light. No scleral icterus.  Neck: Normal range of motion. Neck supple. No tracheal deviation present.  Cardiovascular: Regular rhythm, normal heart sounds and intact distal pulses.  Exam reveals no gallop and no friction rub.   No murmur heard. tachycardic  Respiratory: Effort normal and breath sounds normal. No respiratory distress. He has no wheezes. He has no rales.  GI: Soft. He exhibits no distension. There is tenderness. There is no rebound and no guarding.  Incision clean and dry with staples intact, no erythema or drainage, ileostomy with bridge, pink patent with air and stool in the bag, JP drain with minmal serous output some erythema and minimal drainage around his drain site in LLQ.  Appropriately tender.   Musculoskeletal: Normal range of motion. He exhibits no edema or tenderness.  States numbness in right lateral thigh, no numbness elsewhere  Neurological: He is alert and oriented to person, place, and time. No cranial nerve deficit.  Skin: Skin is warm and dry. No rash noted. No erythema. No pallor.  Psychiatric: He has a normal mood and affect. His behavior is normal. Judgment and thought content normal.     Assessment/Plan 50 year old postoperative day 8 from colovesical fistula takedown patient comes in today having fever chills nausea and increased pain in his  abdomen. Overall patient looks dehydrated and appears to be worsening. I will met him to the hospital start him on IV fluids get labs urinalysis and culture and will get a CAT scan of his abdomen and pelvis to ensure no abscess formation. I will let my partner Dr. Adonis Huguenin know as well will contact Dr. Pilar Jarvis to let him know.  Hubbard Robinson, MD 09/20/2015, 10:19 AM

## 2015-09-21 LAB — BASIC METABOLIC PANEL
ANION GAP: 9 (ref 5–15)
BUN: 17 mg/dL (ref 6–20)
CHLORIDE: 98 mmol/L — AB (ref 101–111)
CO2: 25 mmol/L (ref 22–32)
Calcium: 8.8 mg/dL — ABNORMAL LOW (ref 8.9–10.3)
Creatinine, Ser: 1.11 mg/dL (ref 0.61–1.24)
GFR calc non Af Amer: >60 mL/min (ref >60)
GLUCOSE: 127 mg/dL — AB (ref 65–99)
POTASSIUM: 3.7 mmol/L (ref 3.5–5.1)
Sodium: 132 mmol/L — ABNORMAL LOW (ref 135–145)

## 2015-09-21 LAB — PHOSPHORUS: Phosphorus: 5.2 mg/dL — ABNORMAL HIGH (ref 2.5–4.6)

## 2015-09-21 LAB — CBC
HEMATOCRIT: 40.2 % (ref 40.0–52.0)
HEMOGLOBIN: 13.8 g/dL (ref 13.0–18.0)
MCH: 29.9 pg (ref 26.0–34.0)
MCHC: 34.4 g/dL (ref 32.0–36.0)
MCV: 86.8 fL (ref 80.0–100.0)
Platelets: 310 10*3/uL (ref 150–440)
RBC: 4.63 MIL/uL (ref 4.40–5.90)
RDW: 14.2 % (ref 11.5–14.5)
WBC: 6.4 10*3/uL (ref 3.8–10.6)

## 2015-09-21 LAB — MAGNESIUM: Magnesium: 2.1 mg/dL (ref 1.7–2.4)

## 2015-09-21 MED ORDER — INFLUENZA VAC SPLIT QUAD 0.5 ML IM SUSY: 0.5000 mL | PREFILLED_SYRINGE | Freq: Once | INTRAMUSCULAR | Status: DC

## 2015-09-21 MED ORDER — INFLUENZA VAC SPLIT QUAD 0.5 ML IM SUSY: 0.5000 mL | PREFILLED_SYRINGE | INTRAMUSCULAR | Status: DC

## 2015-09-21 NOTE — Progress Notes (Signed)
CC: Abdominal pain Subjective: Patient reports his pain is much improved since admission. He has been tolerating his clear liquid diet and able to ambulate much better.  Objective: Vital signs in last 24 hours: Temp:  [97.7 F (36.5 C)-98.7 F (37.1 C)] 97.9 F (36.6 C) (03/18 0527) Pulse Rate:  [82-96] 87 (03/18 0527) Resp:  [16-20] 16 (03/18 0527) BP: (105-115)/(64-85) 105/74 mmHg (03/18 0527) SpO2:  [91 %-97 %] 97 % (03/18 0527) Weight:  [87.317 kg (192 lb 8 oz)] 87.317 kg (192 lb 8 oz) (03/17 1052)    Intake/Output from previous day: 03/17 0701 - 03/18 0700 In: 2050.2 [I.V.:2050.2] Out: 2400 [Urine:750; Stool:1650] Intake/Output this shift: Total I/O In: -  Out: 550 [Stool:550]  Physical exam:  Gen.: No acute distress Chest: Clear to auscultation  heart: Regular rhythm Abdomen: Soft, nontender, nondistended. Ostomy in place that is productive of stool and gas. JP in place with no recorded output but serosanguineous fluid within the tubing.  Lab Results: CBC   Recent Labs  09/20/15 1037 09/21/15 0625  WBC 8.8 6.4  HGB 14.8 13.8  HCT 43.8 40.2  PLT 378 310   BMET  Recent Labs  09/20/15 1037 09/21/15 0625  NA 132* 132*  K 5.0 3.7  CL 95* 98*  CO2 27 25  GLUCOSE 110* 127*  BUN 15 17  CREATININE 0.97 1.11  CALCIUM 9.5 8.8*   PT/INR No results for input(s): LABPROT, INR in the last 72 hours. ABG No results for input(s): PHART, HCO3 in the last 72 hours.  Invalid input(s): PCO2, PO2  Studies/Results: Ct Abdomen Pelvis W Contrast  09/20/2015  CLINICAL DATA:  Fever, chills, nausea, abdominal pain. EXAM: CT ABDOMEN AND PELVIS WITH CONTRAST TECHNIQUE: Multidetector CT imaging of the abdomen and pelvis was performed using the standard protocol following bolus administration of intravenous contrast. CONTRAST:  100mL OMNIPAQUE IOHEXOL 300 MG/ML  SOLN COMPARISON:  CT 07/23/2015 FINDINGS: Lower chest: Lung bases are clear. No effusions. Heart is normal size.  Hepatobiliary: No focal abnormality.  Prior cholecystectomy. Pancreas: No focal abnormality.  Mild atrophy. Spleen: Calcifications compatible with old granulomatous disease. Adrenals/Urinary Tract: Normal appearance of the adrenal glands. Small cysts in the lower pole of the right kidney. No hydronephrosis. Urinary bladder is unremarkable. Stomach/Bowel: Right lower quadrant ileostomy noted. Multiple herniated small bowel loops through the ostomy defect without obstruction. Rectosigmoid anastomosis noted. Stomach grossly unremarkable. Vascular/Lymphatic: Aorta and iliac vessels are heavily calcified, non aneurysmal. No adenopathy. Reproductive: No abnormality noted. Other: Drainage catheter coils in the pelvis superior to the bladder. There is mild stranding in the mesentery around the catheter without focal fluid collection. Within the anterior lower pelvis anterior to the bladder, there are locules of free air. No free air elsewhere within the abdomen or pelvis. Musculoskeletal: No acute bony abnormality. IMPRESSION: Postoperative changes. Right lower quadrant ileostomy. Surgical drain in the lower pelvis without focal fluid collection. There is mild stranding in the adjacent mesentery in the lower pelvis, likely postoperative. Locules of air anteriorly within the lower pelvis, extending to near the internal ring of the left inguinal canal. This is presumably postoperative. Right lower quadrant ostomy. Several small bowel loops noted within the ostomy defect and anterior abdominal wall. No obstruction. Heavily calcified aorta. Old granulomatous disease of the spleen. Electronically Signed   By: Charlett NoseKevin  Dover M.D.   On: 09/20/2015 14:20    Anti-infectives: Anti-infectives    Start     Dose/Rate Route Frequency Ordered Stop   09/20/15 1745  ciprofloxacin (CIPRO) IVPB 400 mg     400 mg 200 mL/hr over 60 Minutes Intravenous Every 12 hours 09/20/15 1740     09/20/15 1745  metroNIDAZOLE (FLAGYL) IVPB 500 mg      500 mg 100 mL/hr over 60 Minutes Intravenous Every 8 hours 09/20/15 1740        Assessment/Plan:  50 year old male approximately 2 weeks status post colovesicular fistula repair. Appears to have urinary tract infection being treated with Cipro Flagyl currently. He has improved. If JP continues with no output today we'll remove at bedside. Urology consult requested for possible removal of Foley catheter. Encourage ambulation, incentive spirometer usage, oral intake. Possible transition to oral antibiotics later today versus tomorrow.  Angelle Isais T. Tonita Cong, MD, FACS  09/21/2015

## 2015-09-21 NOTE — Consult Note (Signed)
Consult: abdominal pain  Requested by: Dr. Orvis Brill  Chief Complaint: abdominal pain   History of Present Illness: POD#10 partial colectomy, ileostomy for colovesical fistula. No specific bladder fistula or extravasation found. Area of inflammation/likely fistula tract over sewn and covered wit omentum. He was admitted with abd pain, subjective fever, chills. On cipro. Urine Cx growing > 100k GNR's.  Pt's prior e. Coli urine Cx sensitive to Cipro. CT scan showed no hydro or fluid collections. Pain much improved with IVF and abx. JP drain d/c'd today.     Past Medical History  Diagnosis Date  . GERD (gastroesophageal reflux disease)   . Generalized headaches   . Rectal pain   . Thrombosed hemorrhoids   . Diverticulitis   . COPD (chronic obstructive pulmonary disease) (HCC)   . Arthritis     neck, shoulders   Past Surgical History  Procedure Laterality Date  . Cholecystectomy  1989  . Tonsillectomy  1970  . Appendectomy  1988  . Colonoscopy with propofol N/A 08/16/2015    Procedure: COLONOSCOPY WITH PROPOFOL;  Surgeon: Midge Minium, MD;  Location: [facility];  Service: Endoscopy;  Laterality: N/A;  . Polypectomy  08/16/2015    Procedure: POLYPECTOMY;  Surgeon: Midge Minium, MD;  Location: [facility];  Service: Endoscopy;;  . Laparoscopic partial colectomy N/A 09/11/2015    Procedure: LAPAROSCOPIC PARTIAL COLECTOMY Possible Open, possible ostomy, repair of colovesical fistula Cystoscopy and stent placement, repair of colovesical fistula with Dr. Sherryl Barters as well ;  Surgeon: Gladis Riffle, MD;  Location: [facility];  Service: General;  Laterality: N/A;  . Cystoscopy with stent placement Bilateral 09/11/2015    Procedure: CYSTOSCOPY WITH STENT PLACEMENT;  Surgeon: Hildred Laser, MD;  Location: [facility];  Service: Urology;  Laterality: Bilateral;  . Bladder repair N/A 09/11/2015    Procedure: BLADDER REPAIR;  Surgeon: Hildred Laser, MD;  Location: [facility];  Service:  Urology;  Laterality: N/A;  . Colon surgery      Home Medications:  Prescriptions prior to admission  Medication Sig Dispense Refill Last Dose  . bacitracin ointment Apply topically daily. Place in both nares daily 30 g 0 Taking  . cyclobenzaprine (FLEXERIL) 10 MG tablet Take 1 tablet (10 mg total) by mouth 3 (three) times daily as needed for muscle spasms. 30 tablet 0 Taking  . Ostomy Supplies Pouch MISC 1.5" Convex - 1 Piece Ostomy Appliance 20 each 6 Taking  . oxyCODONE-acetaminophen (PERCOCET) 7.5-325 MG tablet Take 2 tablets by mouth every 4 (four) hours as needed for severe pain. 30 tablet 0 Taking   Allergies: No Known Allergies  Family History  Problem Relation Age of Onset  . Cholecystitis Mother   . Lung cancer Father   . Emphysema Paternal Grandmother   . Diabetes Paternal Grandmother   . Prostate cancer Maternal Uncle    Social History:  reports that he quit smoking 11 days ago. His smoking use included Cigarettes. He has a 30 pack-year smoking history. He has never used smokeless tobacco. He reports that he does not drink alcohol or use illicit drugs.  ROS: A complete review of systems was performed.  All systems are negative except for pertinent findings as noted. Review of Systems  All other systems reviewed and are negative.   Filed Vitals:   09/21/15 0527 09/21/15 1340  BP: 105/74 110/66  Pulse: 87 88  Temp: 97.9 F (36.6 C) 97.7 F (36.5 C)  Resp: 16 17    Intake/Output Summary (Last  24 hours) at 09/21/15 1737 Last data filed at 09/21/15 1400  Gross per 24 hour  Intake 2757.71 ml  Output   3075 ml  Net -317.29 ml    Physical Exam:  Vital signs in last 24 hours: Temp:  [97.7 F (36.5 C)-98 F (36.7 C)] 97.7 F (36.5 C) (03/18 1340) Pulse Rate:  [82-88] 88 (03/18 1340) Resp:  [16-20] 17 (03/18 1340) BP: (105-110)/(64-74) 110/66 mmHg (03/18 1340) SpO2:  [91 %-98 %] 98 % (03/18 1340) General:  Alert and oriented, No acute distress HEENT:  Normocephalic, atraumatic Neck: No JVD or lymphadenopathy Cardiovascular: Regular rate and rhythm Lungs: Regular rate and effort Abdomen: Soft, nontender, nondistended, no abdominal masses Extremities: No edema Neurologic: Grossly intact  Laboratory Data:  Results for orders placed or performed during the hospital encounter of 09/20/15 (from the past 24 hour(s))  CBC     Status: None   Collection Time: 09/21/15  6:25 AM  Result Value Ref Range   WBC 6.4 3.8 - 10.6 K/uL   RBC 4.63 4.40 - 5.90 MIL/uL   Hemoglobin 13.8 13.0 - 18.0 g/dL   HCT 78.240.2 95.640.0 - 21.352.0 %   MCV 86.8 80.0 - 100.0 fL   MCH 29.9 26.0 - 34.0 pg   MCHC 34.4 32.0 - 36.0 g/dL   RDW 08.614.2 57.811.5 - 46.914.5 %   Platelets 310 150 - 440 K/uL  Magnesium     Status: None   Collection Time: 09/21/15  6:25 AM  Result Value Ref Range   Magnesium 2.1 1.7 - 2.4 mg/dL  Phosphorus     Status: Abnormal   Collection Time: 09/21/15  6:25 AM  Result Value Ref Range   Phosphorus 5.2 (H) 2.5 - 4.6 mg/dL  Basic metabolic panel     Status: Abnormal   Collection Time: 09/21/15  6:25 AM  Result Value Ref Range   Sodium 132 (L) 135 - 145 mmol/L   Potassium 3.7 3.5 - 5.1 mmol/L   Chloride 98 (L) 101 - 111 mmol/L   CO2 25 22 - 32 mmol/L   Glucose, Bld 127 (H) 65 - 99 mg/dL   BUN 17 6 - 20 mg/dL   Creatinine, Ser 6.291.11 0.61 - 1.24 mg/dL   Calcium 8.8 (L) 8.9 - 10.3 mg/dL   GFR calc non Af Amer >60 >60 mL/min   GFR calc Af Amer >60 >60 mL/min   Anion gap 9 5 - 15   Recent Results (from the past 240 hour(s))  Urine culture     Status: None (Preliminary result)   Collection Time: 09/20/15 12:32 PM  Result Value Ref Range Status   Specimen Description URINE, CLEAN CATCH  Final   Special Requests NONE  Final   Culture   Final    >=100,000 COLONIES/mL GRAM NEGATIVE RODS IDENTIFICATION AND SUSCEPTIBILITIES TO FOLLOW    Report Status PENDING  Incomplete   Creatinine:  Recent Labs  09/15/15 0614 09/20/15 1037 09/21/15 0625  CREATININE  0.99 0.97 1.11    Impression/Assessment/plan:  Abd pain, fever - much improved. No additional recs. Urine dark, but good urine output and urine much clearer in tubing. No clots. Would want to know urine Cx results so that pt can go out on appropriate po abx and get cystogram this week while on abx. Pt thought cystogram was Friday, but Dr. Sherryl BartersBudzyn wanted two weeks with foley which would be this week for cystogram. Again, would want pt on appropriate abx during cystogram and after foley is d/c'd (  if cystogram negative for extravasation). I'll notify Dr. Sherryl Barters of admission. Dr. Sherryl Barters comes on tomorrow morning.     Caraline Deutschman 09/21/2015, 4:42 PM

## 2015-09-22 LAB — URINE CULTURE

## 2015-09-22 LAB — CBC
HEMATOCRIT: 36.2 % — AB (ref 40.0–52.0)
HEMOGLOBIN: 12.6 g/dL — AB (ref 13.0–18.0)
MCH: 30.4 pg (ref 26.0–34.0)
MCHC: 34.7 g/dL (ref 32.0–36.0)
MCV: 87.5 fL (ref 80.0–100.0)
Platelets: 295 10*3/uL (ref 150–440)
RBC: 4.14 MIL/uL — AB (ref 4.40–5.90)
RDW: 13.3 % (ref 11.5–14.5)
WBC: 7.8 10*3/uL (ref 3.8–10.6)

## 2015-09-22 LAB — BASIC METABOLIC PANEL
Anion gap: 7 (ref 5–15)
BUN: 14 mg/dL (ref 6–20)
CHLORIDE: 101 mmol/L (ref 101–111)
CO2: 26 mmol/L (ref 22–32)
CREATININE: 0.95 mg/dL (ref 0.61–1.24)
Calcium: 8.5 mg/dL — ABNORMAL LOW (ref 8.9–10.3)
GFR calc non Af Amer: >60 mL/min (ref >60)
Glucose, Bld: 103 mg/dL — ABNORMAL HIGH (ref 65–99)
POTASSIUM: 3.8 mmol/L (ref 3.5–5.1)
Sodium: 134 mmol/L — ABNORMAL LOW (ref 135–145)

## 2015-09-22 LAB — PHOSPHORUS: PHOSPHORUS: 4.2 mg/dL (ref 2.5–4.6)

## 2015-09-22 LAB — MAGNESIUM: MAGNESIUM: 1.8 mg/dL (ref 1.7–2.4)

## 2015-09-22 MED ORDER — SULFAMETHOXAZOLE-TRIMETHOPRIM 800-160 MG PO TABS
1.0000 | ORAL_TABLET | Freq: Two times a day (BID) | ORAL | Status: DC
  Administered 2015-09-22: 1 via ORAL
  Filled 2015-09-22 (×2): qty 1

## 2015-09-22 MED ORDER — SULFAMETHOXAZOLE-TRIMETHOPRIM 800-160 MG PO TABS: 1.0000 | ORAL_TABLET | Freq: Two times a day (BID) | ORAL | Status: DC

## 2015-09-22 MED ORDER — AMOXICILLIN-POT CLAVULANATE 875-125 MG PO TABS: 1.0000 | ORAL_TABLET | Freq: Two times a day (BID) | ORAL | Status: DC

## 2015-09-22 MED ORDER — OXYCODONE-ACETAMINOPHEN 7.5-325 MG PO TABS: 2.0000 | ORAL_TABLET | ORAL | Status: DC | PRN

## 2015-09-22 NOTE — Progress Notes (Signed)
Urine culture growing GNR.  Recommend treating patient for UTI. Recommend against cystogram/foley removal in the setting of a positive urine culture. Will arrange for outpatient cystogram as outpatient once UTI has been treated.

## 2015-09-22 NOTE — Discharge Summary (Signed)
Patient ID: Octaviano BattyMark A Glad MRN: [id number] DOB/AGE: 50/01/1966 50 y.o.  Admit date: 09/20/2015 Discharge date: 09/22/2015  Discharge Diagnoses:  Urinary tract infection  Procedures Performed: None  Discharged Condition: good  Hospital Course: Patient admitted from clinic 1 week status post colovesicular fistula repair. During course of workup uses to have urinary tract infection. Once treatment of urinary tract infection was undertaken he had a quick recovery. Culture results returned Escherichia coli that was Bactrim sensitive. He was started on oral Bactrim, tolerated regular diet, was able to care for himself prior to discharge.  Discharge Orders:  discharge home. Will be contacted by urology on Monday to set up a cystogram prior to Foley removal. Follow up in surgery clinic in 1 week for staple removal.  Disposition: 01-Home or Self Care  Discharge Medications:    Medication List    TAKE these medications        bacitracin ointment  Apply topically daily. Place in both nares daily     cyclobenzaprine 10 MG tablet  Commonly known as:  FLEXERIL  Take 1 tablet (10 mg total) by mouth 3 (three) times daily as needed for muscle spasms.     Ostomy Supplies Pouch Misc  1.5" Convex - 1 Piece Ostomy Appliance     oxyCODONE-acetaminophen 7.5-325 MG tablet  Commonly known as:  PERCOCET  Take 2 tablets by mouth every 4 (four) hours as needed for severe pain.     sulfamethoxazole-trimethoprim 800-160 MG tablet  Commonly known as:  BACTRIM DS,SEPTRA DS  Take 1 tablet by mouth every 12 (twelve) hours.         Follwup: Follow-up Information    Follow [facility] with [facility]. Schedule an appointment as soon as possible for a visit in 1 week.   Specialty:  General Surgery   Why:  postop   Contact information:   [address] [place] [id number] [phone]      Signed: Ricarda FrameCharles Asuna Peth 09/22/2015, 5:42 PM

## 2015-09-22 NOTE — Discharge Instructions (Signed)

## 2015-09-22 NOTE — Progress Notes (Signed)
CC: Abdominal pain Subjective: Patient states that his abdominal pain is much improved. He's been tolerating his diet and has been ambulating without assistance.  Objective: Vital signs in last 24 hours: Temp:  [97.6 F (36.4 C)-97.8 F (36.6 C)] 97.8 F (36.6 C) (03/19 0513) Pulse Rate:  [71-88] 80 (03/19 0513) Resp:  [17-20] 18 (03/19 0513) BP: (94-110)/(57-66) 94/60 mmHg (03/19 0513) SpO2:  [98 %-100 %] 100 % (03/19 0513) Last BM Date: 09/21/15  Intake/Output from previous day: 03/18 0701 - 03/19 0700 In: 4232.5 [I.V.:3932.5; IV Piggyback:300] Out: 2975 [Urine:1625; Stool:1350] Intake/Output this shift: Total I/O In: 312.5 [I.V.:312.5] Out: 450 [Urine:250; Stool:200]  Physical exam:  Gen.: No acute distress  chest: Clear to auscultation Heart: Regular rhythm Abdomen: Soft, minimally tender to palpation along the midline, nondistended. Functional ileostomy in the right lower quadrant. Staples well approximated midline without evidence of erythema or drainage. Previous drain site in left lower quadrant with a dressing that is clean, dry, intact.  Lab Results: CBC   Recent Labs  09/21/15 0625 09/22/15 0537  WBC 6.4 7.8  HGB 13.8 12.6*  HCT 40.2 36.2*  PLT 310 295   BMET  Recent Labs  09/21/15 0625 09/22/15 0537  NA 132* 134*  K 3.7 3.8  CL 98* 101  CO2 25 26  GLUCOSE 127* 103*  BUN 17 14  CREATININE 1.11 0.95  CALCIUM 8.8* 8.5*   PT/INR No results for input(s): LABPROT, INR in the last 72 hours. ABG No results for input(s): PHART, HCO3 in the last 72 hours.  Invalid input(s): PCO2, PO2  Studies/Results: Ct Abdomen Pelvis W Contrast  09/20/2015  CLINICAL DATA:  Fever, chills, nausea, abdominal pain. EXAM: CT ABDOMEN AND PELVIS WITH CONTRAST TECHNIQUE: Multidetector CT imaging of the abdomen and pelvis was performed using the standard protocol following bolus administration of intravenous contrast. CONTRAST:  100mL OMNIPAQUE IOHEXOL 300 MG/ML  SOLN  COMPARISON:  CT 07/23/2015 FINDINGS: Lower chest: Lung bases are clear. No effusions. Heart is normal size. Hepatobiliary: No focal abnormality.  Prior cholecystectomy. Pancreas: No focal abnormality.  Mild atrophy. Spleen: Calcifications compatible with old granulomatous disease. Adrenals/Urinary Tract: Normal appearance of the adrenal glands. Small cysts in the lower pole of the right kidney. No hydronephrosis. Urinary bladder is unremarkable. Stomach/Bowel: Right lower quadrant ileostomy noted. Multiple herniated small bowel loops through the ostomy defect without obstruction. Rectosigmoid anastomosis noted. Stomach grossly unremarkable. Vascular/Lymphatic: Aorta and iliac vessels are heavily calcified, non aneurysmal. No adenopathy. Reproductive: No abnormality noted. Other: Drainage catheter coils in the pelvis superior to the bladder. There is mild stranding in the mesentery around the catheter without focal fluid collection. Within the anterior lower pelvis anterior to the bladder, there are locules of free air. No free air elsewhere within the abdomen or pelvis. Musculoskeletal: No acute bony abnormality. IMPRESSION: Postoperative changes. Right lower quadrant ileostomy. Surgical drain in the lower pelvis without focal fluid collection. There is mild stranding in the adjacent mesentery in the lower pelvis, likely postoperative. Locules of air anteriorly within the lower pelvis, extending to near the internal ring of the left inguinal canal. This is presumably postoperative. Right lower quadrant ostomy. Several small bowel loops noted within the ostomy defect and anterior abdominal wall. No obstruction. Heavily calcified aorta. Old granulomatous disease of the spleen. Electronically Signed   By: Charlett NoseKevin  Dover M.D.   On: 09/20/2015 14:20    Anti-infectives: Anti-infectives    Start     Dose/Rate Route Frequency Ordered Stop   09/20/15 1745  ciprofloxacin (CIPRO) IVPB 400 mg     400 mg 200 mL/hr over 60  Minutes Intravenous Every 12 hours 09/20/15 1740     09/20/15 1745  metroNIDAZOLE (FLAGYL) IVPB 500 mg     500 mg 100 mL/hr over 60 Minutes Intravenous Every 8 hours 09/20/15 1740        Assessment/Plan:  50 year old male 10 days status post colovesicular fistula repair. Being treated for urinary tract infection. Culture currently growing gram-negative rods. Once speciation is available we'll transition to oral antibiotic. Once on oral antibiotic to discharge home. Likely discharge home tomorrow. Will return for an outpatient cystourethrogram as well as outpatient follow-up for staple removal.  Peter [name] T. Tonita Cong, MD, FACS  09/22/2015

## 2015-09-22 NOTE — Final Progress Note (Signed)
CC:  abdominal pain Subjective: Patient reports no complaints. Feeling well. Ambulated and tolerated diet. Desires to go home.  Objective: Vital signs in last 24 hours: Temp:  [97.6 F (36.4 C)-97.8 F (36.6 C)] 97.6 F (36.4 C) (03/19 1417) Pulse Rate:  [71-80] 77 (03/19 1417) Resp:  [16-20] 16 (03/19 1417) BP: (94-114)/(57-67) 114/67 mmHg (03/19 1417) SpO2:  [94 %-100 %] 94 % (03/19 1417) Last BM Date: 09/22/15  Intake/Output from previous day: 03/18 0701 - 03/19 0700 In: 4232.5 [I.V.:3932.5; IV Piggyback:300] Out: 2975 [Urine:1625; Stool:1350] Intake/Output this shift: Total I/O In: 867.5 [I.V.:767.5; IV Piggyback:100] Out: 1825 [Urine:1250; Stool:575]  Physical exam:  Gen.: No acute distress taxine abdomen: Soft, nontender, nondistended. Functioning ostomy. Staples well approximated without erythema or drainage.  GU: Foley in place.  Lab Results: CBC   Recent Labs  09/21/15 0625 09/22/15 0537  WBC 6.4 7.8  HGB 13.8 12.6*  HCT 40.2 36.2*  PLT 310 295   BMET  Recent Labs  09/21/15 0625 09/22/15 0537  NA 132* 134*  K 3.7 3.8  CL 98* 101  CO2 25 26  GLUCOSE 127* 103*  BUN 17 14  CREATININE 1.11 0.95  CALCIUM 8.8* 8.5*   PT/INR No results for input(s): LABPROT, INR in the last 72 hours. ABG No results for input(s): PHART, HCO3 in the last 72 hours.  Invalid input(s): PCO2, PO2  Studies/Results: No results found.  Anti-infectives: Anti-infectives    Start     Dose/Rate Route Frequency Ordered Stop   09/22/15 1100  amoxicillin-clavulanate (AUGMENTIN) 875-125 MG per tablet 1 tablet  Status:  Discontinued     1 tablet Oral Every 12 hours 09/22/15 1046 09/22/15 1047   09/22/15 1100  sulfamethoxazole-trimethoprim (BACTRIM DS,SEPTRA DS) 800-160 MG per tablet 1 tablet     1 tablet Oral Every 12 hours 09/22/15 1047     09/22/15 0000  sulfamethoxazole-trimethoprim (BACTRIM DS,SEPTRA DS) 800-160 MG tablet     1 tablet Oral Every 12 hours 09/22/15 1741      09/20/15 1745  ciprofloxacin (CIPRO) IVPB 400 mg  Status:  Discontinued     400 mg 200 mL/hr over 60 Minutes Intravenous Every 12 hours 09/20/15 1740 09/22/15 1046   09/20/15 1745  metroNIDAZOLE (FLAGYL) IVPB 500 mg  Status:  Discontinued     500 mg 100 mL/hr over 60 Minutes Intravenous Every 8 hours 09/20/15 1740 09/22/15 1046      Assessment/Plan:  50 year old male 10 days status post colovesicular fistula repair with urinary tract infection. Tolerated oral antibiotic. Doing well. Discharge home with follow-up this week for voiding cystogram and Foley removal. And follow-up in surgery clinic for staple removal.  Kodie Kishi T. Tonita CongWoodham, MD, FACS  09/22/2015

## 2015-09-22 NOTE — Progress Notes (Signed)
09/22/2015 6:20 PM  Peter GainerMark A Ridings to be D/C'd Home per MD order.  Discussed prescriptions and follow up appointments with the patient. Prescriptions given to patient, medication list explained in detail. Pt verbalized understanding.    Medication List    TAKE these medications        bacitracin ointment  Apply topically daily. Place in both nares daily     cyclobenzaprine 10 MG tablet  Commonly known as:  FLEXERIL  Take 1 tablet (10 mg total) by mouth 3 (three) times daily as needed for muscle spasms.     Ostomy Supplies Pouch Misc  1.5" Convex - 1 Piece Ostomy Appliance     oxyCODONE-acetaminophen 7.5-325 MG tablet  Commonly known as:  PERCOCET  Take 2 tablets by mouth every 4 (four) hours as needed for severe pain.     sulfamethoxazole-trimethoprim 800-160 MG tablet  Commonly known as:  BACTRIM DS,SEPTRA DS  Take 1 tablet by mouth every 12 (twelve) hours.        Filed Vitals:   09/22/15 0513 09/22/15 1417  BP: 94/60 114/67  Pulse: 80 77  Temp: 97.8 F (36.6 C) 97.6 F (36.4 C)  Resp: 18 16    Skin clean, dry and intact without evidence of skin break down, no evidence of skin tears noted. IV catheter discontinued intact. Site without signs and symptoms of complications. Dressing and pressure applied. Pt denies pain at this time. No complaints noted.  An After Visit Summary was printed and given to the patient. Patient escorted via WC, and D/C home via private auto.  Bradly Chrisougherty, Lameka Disla E

## 2015-09-23 ENCOUNTER — Other Ambulatory Visit: Payer: Self-pay

## 2015-09-23 ENCOUNTER — Ambulatory Visit: Payer: Managed Care, Other (non HMO) | Admitting: Surgery

## 2015-09-23 DIAGNOSIS — N321 Vesicointestinal fistula: Secondary | ICD-10-CM

## 2015-09-25 ENCOUNTER — Encounter: Payer: Self-pay | Admitting: General Surgery

## 2015-09-25 ENCOUNTER — Ambulatory Visit (INDEPENDENT_AMBULATORY_CARE_PROVIDER_SITE_OTHER): Payer: Managed Care, Other (non HMO) | Admitting: General Surgery

## 2015-09-25 ENCOUNTER — Ambulatory Visit
Admission: RE | Admit: 2015-09-25 | Discharge: 2015-09-25 | Disposition: A | Discharge status: Home / Self Care | Payer: Managed Care, Other (non HMO) | Source: Ambulatory Visit | Attending: Urology | Admitting: Urology

## 2015-09-25 VITALS — BP 134/83 | HR 131 | Temp 98.3°F | Ht 70.0 in | Wt 193.4 lb

## 2015-09-25 DIAGNOSIS — N321 Vesicointestinal fistula: Secondary | ICD-10-CM | POA: Insufficient documentation

## 2015-09-25 DIAGNOSIS — Z4889 Encounter for other specified surgical aftercare: Secondary | ICD-10-CM

## 2015-09-25 MED ORDER — IOTHALAMATE MEGLUMINE 17.2 % UR SOLN: 250.0000 mL | Freq: Once | URETHRAL | Status: DC | PRN

## 2015-09-25 NOTE — Progress Notes (Signed)
Outpatient Surgical Follow Up  09/25/2015  Peter [name] is an 50 y.o. male.   Chief Complaint  Patient presents with  . Routine Post Op    Colovesical Fistula Repair (09/11/15)- Dr. Orvis BrillLoflin    HPI: 50 year old male returns to clinic for follow-up from recent hospitalization. He was found to have urinary tract infection the setting of a colovesicular fistula repair. Staples removed this morning in clinic without difficulty. He continues of a Foley catheter but is scheduled for his voiding cystogram today. He states he's feeling much better than he was last week when he was readmitted. He denies any fevers, chills, nausea, vomiting, diarrhea constipation, chest pain, shortness of breath. He's been having good ostomy output.  Past Medical History  Diagnosis Date  . GERD (gastroesophageal reflux disease)   . Generalized headaches   . Rectal pain   . Thrombosed hemorrhoids   . Diverticulitis   . COPD (chronic obstructive pulmonary disease) (HCC)   . Arthritis     neck, shoulders    Past Surgical History  Procedure Laterality Date  . Cholecystectomy  1989  . Tonsillectomy  1970  . Appendectomy  1988  . Colonoscopy with propofol N/A 08/16/2015    Procedure: COLONOSCOPY WITH PROPOFOL;  Surgeon: Midge Miniumarren Wohl, MD;  [facility];  Service: Endoscopy;  Laterality: N/A;  . Polypectomy  08/16/2015    Procedure: POLYPECTOMY;  Surgeon: Midge Miniumarren Wohl, MD;  [facility];  Service: Endoscopy;;  . Laparoscopic partial colectomy N/A 09/11/2015    Procedure: LAPAROSCOPIC PARTIAL COLECTOMY Possible Open, possible ostomy, repair of colovesical fistula Cystoscopy and stent placement, repair of colovesical fistula with Dr. Sherryl BartersBudzyn as well ;  Surgeon: Gladis Riffleatherine L Loflin, MD;  Location: [facility] OR[facility];  Service: General;  Laterality: N/A;  . Cystoscopy with stent placement Bilateral 09/11/2015    Procedure: CYSTOSCOPY WITH STENT PLACEMENT;  Surgeon: Hildred LaserBrian James Budzyn, MD;  Location: [facility]  [facility];  Service: Urology;  Laterality: Bilateral;  . Bladder repair N/A 09/11/2015    Procedure: BLADDER REPAIR;  Surgeon: Hildred LaserBrian James Budzyn, MD;  Location: [facility] OR[facility];  Service: Urology;  Laterality: N/A;  . Colon surgery      Family History  Problem Relation Age of Onset  . Cholecystitis Mother   . Lung cancer Father   . Emphysema Paternal Grandmother   . Diabetes Paternal Grandmother   . Prostate cancer Maternal Uncle     Social History:  reports that he quit smoking about 2 weeks ago. His smoking use included Cigarettes. He has a 30 pack-year smoking history. He has never used smokeless tobacco. He reports that he does not drink alcohol or use illicit drugs.  Allergies: No Known Allergies  Medications reviewed.    ROS A multipoint review of systems was completed, all pertinent positives and negatives are documented within the history of present illness and remainder are negative.   BP 134/83 mmHg  Pulse 131  Temp(Src) 98.3 F (36.8 C) (Oral)  Ht 5\' 10"  (1.778 m)  Wt 87.726 kg (193 lb 6.4 oz)  BMI 27.75 kg/m2  Physical Exam Gen.: No acute distress Chest: Clear to auscultation  heart: Tachycardic Abdomen: Soft, nontender, nondistended. Ostomy present in the right lower quadrant that is productive of stool and gas. Midline incision well approximated with minimal hyperemia but no evidence of erythema or drainage. Prior JP site also with hyperemia and granulation tissue but no evidence of infection.    No results found for this or any previous visit (from the  past 48 hour(s)). Cystogram  09/25/2015  CLINICAL DATA:  History of colovesical fistula with subsequent repair ; patient has a Foley catheter. EXAM: CYSTOGRAM TECHNIQUE: After catheterization of the urinary bladder following sterile technique the bladder was filled with 250 cc mL Cysto-Conray 30% by drip infusion. Serial spot images were obtained during bladder filling and post draining. FLUOROSCOPY TIME:  1 minutes, 30  seconds. COMPARISON:  None. FINDINGS: The scout film reveals normal appearance of the visualized portions of the pelvis. Filling of the urinary bladder was copies through the balloon tip Foley catheter. The contour of the bladder was normal. When filled with 250 cc of urine the patient was uncomfortable. Spot filming was performed and then the urinary bladder drained. The contour of the bladder is smooth. No fistulous connection with the colon was observed. No extraluminal contrast was demonstrated. IMPRESSION: There is no evidence of a colovesical fistula on today's study. The urinary bladder is normal in appearance. Electronically Signed   By: David  [place] M.D.   On: 09/25/2015 13:06    Assessment/Plan:  1. Aftercare following surgery 50 year old male now 2 weeks status post colovesicular fistula repair. Doing well. Staples removed in clinic today. He is to follow-up with urology for hopeful removal of his Foley catheter. Plan for return to clinic in 2 weeks to see Dr. Orvis Brill to discuss further imaging and ileostomy reversal. He'll return to clinic sooner should he have any signs of infection or other concerns.     Ricarda Frame, MD FACS General Surgeon  09/25/2015,1:31 PM

## 2015-09-25 NOTE — Patient Instructions (Signed)
We will see you back in clinic with Dr. Orvis BrillLoflin in 2 weeks.

## 2015-09-26 ENCOUNTER — Ambulatory Visit (INDEPENDENT_AMBULATORY_CARE_PROVIDER_SITE_OTHER): Payer: Managed Care, Other (non HMO) | Admitting: Urology

## 2015-09-26 ENCOUNTER — Telehealth: Payer: Self-pay

## 2015-09-26 ENCOUNTER — Encounter: Payer: Self-pay | Admitting: Urology

## 2015-09-26 VITALS — BP 102/71 | HR 111 | Ht 70.0 in | Wt 189.4 lb

## 2015-09-26 DIAGNOSIS — N321 Vesicointestinal fistula: Secondary | ICD-10-CM | POA: Diagnosis not present

## 2015-09-26 NOTE — Telephone Encounter (Signed)
FMLA Forms were updated with new information and faxed to [employer] [facility] [employer] and his employer (Attention: Kathrynn Runningenise Drury).

## 2015-09-26 NOTE — Progress Notes (Signed)
Fill and Pull Catheter Removal  Patient is present today for a catheter removal.  Patient was cleaned and prepped in a sterile fashion 240ml of sterile water/ saline was instilled into the bladder when the patient felt the urge to urinate. 8 ml of water was then drained from the balloon.  A 18FR foley cath was removed from the bladder no complications were noted .  Patient as then given some time to void on their own.  Patient can void  225ml on their own after some time.  Patient tolerated well.  Preformed by: Theotis BurrowK.Viviane Semidey,CMA  Follow up/ Additional notes: F/U PRN per Dr. Sherryl BartersBudzyn

## 2015-09-26 NOTE — Progress Notes (Signed)
09/26/2015 8:41 AM   Peter BattyMark A Yonts 09/10/1965 [id number]  Referring provider: Gabriel Cirriheryl Wicker, NP [address] [place], [place] [id number]  Chief Complaint  Patient presents with  . Follow-up    colovesical fiscula, cystogram    HPI: Patient is a [age] year old male who recently underwent a colovesical fistula repair by Dr. Orvis BrillLoflin. During this operation, urology oversewed the area of the presumed colovesical fistula though no fistula was actually able to be located within the bladder was filled with normal saline. His post operative was complicated by a UTI which he has been on culture appropriate antibiotics now for approximately 7 days. His cystogram is unremarkable.  The patient's bladder was instilled 240 cc today. He voided 235 cc. He passed his trial of void   PMH: Past Medical History  Diagnosis Date  . GERD (gastroesophageal reflux disease)   . Generalized headaches   . Rectal pain   . Thrombosed hemorrhoids   . Diverticulitis   . COPD (chronic obstructive pulmonary disease) (HCC)   . Arthritis     neck, shoulders    Surgical History: Past Surgical History  Procedure Laterality Date  . Cholecystectomy  1989  . Tonsillectomy  1970  . Appendectomy  1988  . Colonoscopy with propofol N/A 08/16/2015    Procedure: COLONOSCOPY WITH PROPOFOL;  Surgeon: Midge Miniumarren Wohl, MD;  [facility];  Service: Endoscopy;  Laterality: N/A;  . Polypectomy  08/16/2015    Procedure: POLYPECTOMY;  Surgeon: Midge Miniumarren Wohl, MD;  [facility];  Service: Endoscopy;;  . Laparoscopic partial colectomy N/A 09/11/2015    Procedure: LAPAROSCOPIC PARTIAL COLECTOMY Possible Open, possible ostomy, repair of colovesical fistula Cystoscopy and stent placement, repair of colovesical fistula with Dr. Sherryl BartersBudzyn as well ;  Surgeon: Gladis Riffleatherine L Loflin, MD;  Location: [facility];  Service: General;  Laterality: N/A;  . Cystoscopy with stent placement Bilateral 09/11/2015    Procedure: CYSTOSCOPY WITH STENT  PLACEMENT;  Surgeon: Hildred LaserBrian James Esmae Donathan, MD;  Location: [facility];  Service: Urology;  Laterality: Bilateral;  . Bladder repair N/A 09/11/2015    Procedure: BLADDER REPAIR;  Surgeon: Hildred LaserBrian James Merrianne Mccumbers, MD;  Location: [facility];  Service: Urology;  Laterality: N/A;  . Colon surgery      Home Medications:    Medication List       This list is accurate as of: 09/26/15  8:41 AM.  Always use your most recent med list.               bacitracin ointment  Apply topically daily. Place in both nares daily     cyclobenzaprine 10 MG tablet  Commonly known as:  FLEXERIL  Take 1 tablet (10 mg total) by mouth 3 (three) times daily as needed for muscle spasms.     Ostomy Supplies Pouch Misc  1.5" Convex - 1 Piece Ostomy Appliance     oxyCODONE-acetaminophen 7.5-325 MG tablet  Commonly known as:  PERCOCET  Take 2 tablets by mouth every 4 (four) hours as needed for severe pain.     sulfamethoxazole-trimethoprim 800-160 MG tablet  Commonly known as:  BACTRIM DS,SEPTRA DS  Take 1 tablet by mouth every 12 (twelve) hours.        Allergies: No Known Allergies  Family History: Family History  Problem Relation Age of Onset  . Cholecystitis Mother   . Lung cancer Father   . Emphysema Paternal Grandmother   . Diabetes Paternal Grandmother   . Prostate cancer Maternal Uncle     Social  History:  reports that he quit smoking about 2 weeks ago. His smoking use included Cigarettes. He has a 30 pack-year smoking history. He has never used smokeless tobacco. He reports that he does not drink alcohol or use illicit drugs.  ROS: UROLOGY Frequent Urination?: No Hard to postpone urination?: No Burning/pain with urination?: No Get up at night to urinate?: No Leakage of urine?: No Urine stream starts and stops?: No Trouble starting stream?: No Do you have to strain to urinate?: No Blood in urine?: No Urinary tract infection?: No Sexually transmitted disease?: No Injury to kidneys or bladder?:  No Painful intercourse?: No Weak stream?: No Erection problems?: No Penile pain?: No  Gastrointestinal Nausea?: No Vomiting?: No Indigestion/heartburn?: No Diarrhea?: No Constipation?: No  Constitutional Fever: No Night sweats?: No Weight loss?: No Fatigue?: No  Skin Skin rash/lesions?: No Itching?: No  Eyes Blurred vision?: No Double vision?: No  Ears/Nose/Throat Sore throat?: No Sinus problems?: No  Hematologic/Lymphatic Swollen glands?: No Easy bruising?: No  Cardiovascular Leg swelling?: No Chest pain?: No  Respiratory Cough?: No Shortness of breath?: No  Endocrine Excessive thirst?: No  Musculoskeletal Back pain?: No Joint pain?: No  Neurological Headaches?: No Dizziness?: No  Psychologic Depression?: No Anxiety?: No  Physical Exam: BP 102/71 mmHg  Pulse 111  Ht  (1.778 m)  Wt 189 lb 6.4 oz (85.911 kg)  BMI 27.18 kg/m2  Constitutional:  Alert and oriented, No acute distress. HEENT: [place] AT, moist mucus membranes.  Trachea midline, no masses. Cardiovascular: No clubbing, cyanosis, or edema. Respiratory: Normal respiratory effort, no increased work of breathing. GI: Abdomen is soft, nontender, nondistended, no abdominal masses GU: No CVA tenderness. Skin: No rashes, bruises or suspicious lesions. Lymph: No cervical or inguinal adenopathy. Neurologic: Grossly intact, no focal deficits, moving all 4 extremities. Psychiatric: Normal mood and affect.  Laboratory Data: Lab Results  Component Value Date   WBC 7.8 09/22/2015   HGB 12.6* 09/22/2015   HCT 36.2* 09/22/2015   MCV 87.5 09/22/2015   PLT 295 09/22/2015    Lab Results  Component Value Date   CREATININE 0.95 09/22/2015    No results found for: PSA  No results found for: TESTOSTERONE  No results found for: HGBA1C  Urinalysis    Component Value Date/Time   COLORURINE YELLOW* 09/20/2015 1232   COLORURINE Yellow 09/09/2011 1744   APPEARANCEUR CLOUDY* 09/20/2015  1232   APPEARANCEUR Clear 09/09/2011 1744   LABSPEC 1.012 09/20/2015 1232   LABSPEC 1.008 09/09/2011 1744   PHURINE 6.0 09/20/2015 1232   PHURINE 6.0 09/09/2011 1744   GLUCOSEU NEGATIVE 09/20/2015 1232   GLUCOSEU Negative 09/09/2011 1744   HGBUR 2+* 09/20/2015 1232   HGBUR Negative 09/09/2011 1744   BILIRUBINUR NEGATIVE 09/20/2015 1232   BILIRUBINUR Negative 09/09/2011 1744   KETONESUR NEGATIVE 09/20/2015 1232   KETONESUR Negative 09/09/2011 1744   PROTEINUR NEGATIVE 09/20/2015 1232   PROTEINUR Negative 09/09/2011 1744   NITRITE POSITIVE* 09/20/2015 1232   NITRITE Negative 09/09/2011 1744   LEUKOCYTESUR 3+* 09/20/2015 1232   LEUKOCYTESUR Negative 09/09/2011 1744    Pertinent Imaging:  CLINICAL DATA: History of colovesical fistula with subsequent repair ; patient has a Foley catheter.  EXAM: CYSTOGRAM  TECHNIQUE: After catheterization of the urinary bladder following sterile technique the bladder was filled with 250 cc mL Cysto-Conray 30% by drip infusion. Serial spot images were obtained during bladder filling and post draining.  FLUOROSCOPY TIME: 1 minutes, 30 seconds.  COMPARISON: None.  FINDINGS: The scout film reveals normal  appearance of the visualized portions of the pelvis. Filling of the urinary bladder was copies through the balloon tip Foley catheter. The contour of the bladder was normal. When filled with 250 cc of urine the patient was uncomfortable. Spot filming was performed and then the urinary bladder drained.  The contour of the bladder is smooth. No fistulous connection with the colon was observed. No extraluminal contrast was demonstrated.  IMPRESSION: There is no evidence of a colovesical fistula on today's study. The urinary bladder is normal in appearance.     Assessment & Plan:    1. Colovesical fistula s/p colovesical fistula takedown, ileostomy, partial colectomy by general surgery with bladder closure by urology -patient  passed trial of void today. No further urologic work up needed at this time.  Return if symptoms worsen or fail to improve.  Hildred Laser, MD  [facility] [address], [address] [place], [place] [address] [phone]

## 2015-10-03 ENCOUNTER — Ambulatory Visit: Payer: Managed Care, Other (non HMO)

## 2015-10-04 ENCOUNTER — Telehealth: Payer: Self-pay

## 2015-10-04 NOTE — Telephone Encounter (Signed)
Patient called in at this time and explains that he has been having Abdominal Pain around his umbilical area with vomiting and diarrhea from his colostomy since last night. Denies fever/chills at this time. Patient denies anyone else being sick around him. I informed patient that he would need to go to Emergency Room for evaluation of this as he will most likely need IV fluids for rehydration and an exam to identify why he is having the symptoms that he is experiencing.

## 2015-10-08 ENCOUNTER — Ambulatory Visit (INDEPENDENT_AMBULATORY_CARE_PROVIDER_SITE_OTHER): Payer: Managed Care, Other (non HMO) | Admitting: Surgery

## 2015-10-08 ENCOUNTER — Encounter: Payer: Self-pay | Admitting: Surgery

## 2015-10-08 VITALS — BP 122/81 | HR 112 | Temp 98.1°F | Ht 70.0 in | Wt 192.4 lb

## 2015-10-08 DIAGNOSIS — K5732 Diverticulitis of large intestine without perforation or abscess without bleeding: Secondary | ICD-10-CM

## 2015-10-08 DIAGNOSIS — N321 Vesicointestinal fistula: Secondary | ICD-10-CM

## 2015-10-08 MED ORDER — PANTOPRAZOLE SODIUM 40 MG PO TBEC: 40.0000 mg | DELAYED_RELEASE_TABLET | Freq: Every day | ORAL | Status: DC

## 2015-10-08 NOTE — Patient Instructions (Addendum)
Please increase your water intake to a goal of 72 oz. Daily. This will help with your heart rate and feeling of fatigue.  I have sent Pantoprazole to your pharmacy to help with your Heartburn and "quesy" feeling. You will need to take this every day in order for this to help.  Also, exercise on your bike or walking 30 minutes daily to build up your stamina.  We will begin planning [facility] with Dr. Orvis BrillLoflin. To get ready for this we will get a Barium Enema on 10/24/15 at 0900am, [facility]. Only instructions is that you must only drink clear liquids for 24 hours prior to the exam. You may not eat within this time period.  You will then follow-up with Dr. Orvis BrillLoflin on 10/29/15 at 0815am in [facility] to discuss the details of your Ileostomy reversal and to go over Barium enema results.

## 2015-10-08 NOTE — Progress Notes (Signed)
50 year old status post partial colectomy and colovesical fistula takedown, with ileostomy placement on March 8.  Patient did have UTI right after which was treated in the hospital and with antibiotics. He had his Foley catheter removed on March 23. Patient has been urinating well without any difficulty. Patient does state that he still fatigued and gets winded just walking from the parking lot to the office. Patient did have episode of nausea vomiting and more watery output from his ileostomy last week. The patient states that he is improved now. Patient states he's been drinking a lot of sweet tea and sodas and less water than he knows that he should. Patient also complains of some heartburn which she was on Prilosec previously which was helping but he is not taking any at this time.  Filed Vitals:   10/08/15 1020  BP: 122/81  Pulse: 112  Temp: 98.1 F (36.7 C)   PE:  Gen: NAD Res: CTAB/L  Cardio: Tachycardic but regular rhythm  Abd: soft, non-distended, incision sites healing well, ileostomy pink, patent, red-rubber cath bridge in place Ext: 2+ pulses, no edema  A/P:  Patient is healing well from open partial colectomy and colovesical fistula takedown with ileostomy.  Discussed removing the red rubber catheter bridge on the ileostomy but patient states that it helps him when he is changing the bag and would like to keep it in place. I did discuss with him that it appears that he is getting dehydrated and that he needs to increase his water intake. Patient is in agreement with this and states that he is going to increase that as well. Also discussed with him getting some exercise for 30 minutes a day either walking or using his exercise bike. Patient is in agreement with that. Will schedule the patient to start pantoprazole to help with his heartburn.  Patient will be at the 6 week [place] from takedown on the week of April 20. Will get a barium enema to ensure no stenosis around the area of the  colon anastomosis at that time. We'll have him come back on April 25 for a follow-up visit and to schedule his ileostomy takedown hopefully for the first week in May. Given the patient's current state and the fact that we need to reoperate he should be out of work through this time for at least 3 weeks out from his second operation. The disability paperwork has been updated and sent to his office. Patient was given opportunity to ask questions and have them answered he is in agreement with this plan.

## 2015-10-24 ENCOUNTER — Ambulatory Visit
Admission: RE | Admit: 2015-10-24 | Discharge: 2015-10-24 | Disposition: A | Discharge status: Home / Self Care | Payer: Managed Care, Other (non HMO) | Source: Ambulatory Visit | Attending: Surgery | Admitting: Surgery

## 2015-10-24 DIAGNOSIS — K5732 Diverticulitis of large intestine without perforation or abscess without bleeding: Secondary | ICD-10-CM

## 2015-10-24 DIAGNOSIS — Z432 Encounter for attention to ileostomy: Secondary | ICD-10-CM | POA: Diagnosis present

## 2015-10-29 ENCOUNTER — Ambulatory Visit (INDEPENDENT_AMBULATORY_CARE_PROVIDER_SITE_OTHER): Payer: Managed Care, Other (non HMO) | Admitting: Surgery

## 2015-10-29 ENCOUNTER — Encounter: Payer: Self-pay | Admitting: Surgery

## 2015-10-29 VITALS — BP 109/74 | HR 97 | Temp 97.7°F | Ht 70.0 in | Wt 202.2 lb

## 2015-10-29 DIAGNOSIS — K5732 Diverticulitis of large intestine without perforation or abscess without bleeding: Secondary | ICD-10-CM

## 2015-10-29 DIAGNOSIS — N321 Vesicointestinal fistula: Secondary | ICD-10-CM

## 2015-10-29 DIAGNOSIS — K56699 Other intestinal obstruction unspecified as to partial versus complete obstruction: Secondary | ICD-10-CM

## 2015-10-29 DIAGNOSIS — K5669 Other intestinal obstruction: Secondary | ICD-10-CM

## 2015-10-29 NOTE — Patient Instructions (Addendum)
Please see your Blue (Pre-care) Form for information regarding your surgery.  Your Barium enema is scheduled at [facility] for 10/30/15 at 10:15am.  Please stop smoking prior to your surgery to give you the best outcome possible, if you are having difficulty with just the patches, call and we can discuss other options.  We will schedule your Ileostomy takedown for 11/19/15. You will not need to do a bowel prep for this surgery.    Smoking Cessation, Tips for Success If you are ready to quit smoking, congratulations! You have chosen to help yourself be healthier. Cigarettes bring nicotine, tar, carbon monoxide, and other irritants into your body. Your lungs, heart, and blood vessels will be able to work better without these poisons. There are many different ways to quit smoking. Nicotine gum, nicotine patches, a nicotine inhaler, or nicotine nasal spray can help with physical craving. Hypnosis, support groups, and medicines help break the habit of smoking. WHAT THINGS CAN I DO TO MAKE QUITTING EASIER?  Here are some tips to help you quit for good:  Pick a date when you will quit smoking completely. Tell all of your friends and family about your plan to quit on that date.  Do not try to slowly cut down on the number of cigarettes you are smoking. Pick a quit date and quit smoking completely starting on that day.  Throw away all cigarettes.   Clean and remove all ashtrays from your home, work, and car.  On a card, write down your reasons for quitting. Carry the card with you and read it when you get the urge to smoke.  Cleanse your body of nicotine. Drink enough water and fluids to keep your urine clear or pale yellow. Do this after quitting to flush the nicotine from your body.  Learn to predict your moods. Do not let a bad situation be your excuse to have a cigarette. Some situations in your life might tempt you into wanting a cigarette.  Never have "just one" cigarette. It leads to wanting  another and another. Remind yourself of your decision to quit.  Change habits associated with smoking. If you smoked while driving or when feeling stressed, try other activities to replace smoking. Stand up when drinking your coffee. Brush your teeth after eating. Sit in a different chair when you read the paper. Avoid alcohol while trying to quit, and try to drink fewer caffeinated beverages. Alcohol and caffeine may urge you to smoke.  Avoid foods and drinks that can trigger a desire to smoke, such as sugary or spicy foods and alcohol.  Ask people who smoke not to smoke around you.  Have something planned to do right after eating or having a cup of coffee. For example, plan to take a walk or exercise.  Try a relaxation exercise to calm you down and decrease your stress. Remember, you may be tense and nervous for the first 2 weeks after you quit, but this will pass.  Find new activities to keep your hands busy. Play with a pen, coin, or rubber band. Doodle or draw things on paper.  Brush your teeth right after eating. This will help cut down on the craving for the taste of tobacco after meals. You can also try mouthwash.   Use oral substitutes in place of cigarettes. Try using lemon drops, carrots, cinnamon sticks, or chewing gum. Keep them handy so they are available when you have the urge to smoke.  When you have the urge to smoke, try  deep breathing.  Designate your home as a nonsmoking area.  If you are a heavy smoker, ask your health care provider about a prescription for nicotine chewing gum. It can ease your withdrawal from nicotine.  Reward yourself. Set aside the cigarette money you save and buy yourself something nice.  Look for support from others. Join a support group or smoking cessation program. Ask someone at home or at work to help you with your plan to quit smoking.  Always ask yourself, "Do I need this cigarette or is this just a reflex?" Tell yourself, "Today, I  choose not to smoke," or "I do not want to smoke." You are reminding yourself of your decision to quit.  Do not replace cigarette smoking with electronic cigarettes (commonly called e-cigarettes). The safety of e-cigarettes is unknown, and some may contain harmful chemicals.  If you relapse, do not give up! Plan ahead and think about what you will do the next time you get the urge to smoke. HOW WILL I FEEL WHEN I QUIT SMOKING? You may have symptoms of withdrawal because your body is used to nicotine (the addictive substance in cigarettes). You may crave cigarettes, be irritable, feel very hungry, cough often, get headaches, or have difficulty concentrating. The withdrawal symptoms are only temporary. They are strongest when you first quit but will go away within 10-14 days. When withdrawal symptoms occur, stay in control. Think about your reasons for quitting. Remind yourself that these are signs that your body is healing and getting used to being without cigarettes. Remember that withdrawal symptoms are easier to treat than the major diseases that smoking can cause.  Even after the withdrawal is over, expect periodic urges to smoke. However, these cravings are generally short lived and will go away whether you smoke or not. Do not smoke! WHAT RESOURCES ARE AVAILABLE TO HELP ME QUIT SMOKING? Your health care provider can direct you to community resources or hospitals for support, which may include:  Group support.  Education.  Hypnosis.  Therapy.   This information is not intended to replace advice given to you by your health care provider. Make sure you discuss any questions you have with your health care provider.   Document Released: 03/20/2004 Document Revised: 07/13/2014 Document Reviewed: 12/08/2012 Else[employer] Interactive Patient Education [employer]

## 2015-10-29 NOTE — Progress Notes (Signed)
Subjective:     Patient ID: Peter [name], male   DOB: 11/04/1965, 50 y.o.   MRN: [id number]  HPI  50 yr old male who had colovesical fistula with Partial coloectomy with rectal anastomosis and ileostomy repair on 3/8.  Patient had been doing well since then.  He did have some issues with dehydration but is improving of that.  He has unfortunately started smoking again and is up to 1/2 ppd now.  He stated that the Chantix worked but was too expensive to afford at this time but that the nicotine patches helped him.  He understands the need to quit and is willing to do this.  He states that he is only using ibuprofen occasionally for pain and soreness.  He does still have some epigastric pain in AM prior to breakfast. He states that the protonix is helping with this.    Past Medical History  Diagnosis Date  . GERD (gastroesophageal reflux disease)   . Generalized headaches   . Rectal pain   . Thrombosed hemorrhoids   . Diverticulitis   . COPD (chronic obstructive pulmonary disease) (HCC)   . Arthritis     neck, shoulders   Past Surgical History  Procedure Laterality Date  . Cholecystectomy  1989  . Tonsillectomy  1970  . Appendectomy  1988  . Colonoscopy with propofol N/A 08/16/2015    Procedure: COLONOSCOPY WITH PROPOFOL;  Surgeon: Midge Miniumarren Wohl, MD;  [facility];  Service: Endoscopy;  Laterality: N/A;  . Polypectomy  08/16/2015    Procedure: POLYPECTOMY;  Surgeon: Midge Miniumarren Wohl, MD;  [facility];  Service: Endoscopy;;  . Laparoscopic partial colectomy N/A 09/11/2015    Procedure: LAPAROSCOPIC PARTIAL COLECTOMY Possible Open, possible ostomy, repair of colovesical fistula Cystoscopy and stent placement, repair of colovesical fistula with Dr. Sherryl BartersBudzyn as well ;  Surgeon: Gladis Riffleatherine L Loflin, MD;  Location: [facility];  Service: General;  Laterality: N/A;  . Cystoscopy with stent placement Bilateral 09/11/2015    Procedure: CYSTOSCOPY WITH STENT PLACEMENT;  Surgeon: Hildred LaserBrian  James Budzyn, MD;  Location: [facility];  Service: Urology;  Laterality: Bilateral;  . Bladder repair N/A 09/11/2015    Procedure: BLADDER REPAIR;  Surgeon: Hildred LaserBrian James Budzyn, MD;  Location: [facility];  Service: Urology;  Laterality: N/A;  . Colon surgery     Family History  Problem Relation Age of Onset  . Cholecystitis Mother   . Lung cancer Father   . Emphysema Paternal Grandmother   . Diabetes Paternal Grandmother   . Prostate cancer Maternal Uncle    Social History   Social History  . Marital Status: Single    Spouse Name: N/A  . Number of Children: N/A  . Years of Education: N/A   Social History Main Topics  . Smoking status: Current Every Day Smoker -- 0.50 packs/day for 30 years    Types: Cigarettes  . Smokeless tobacco: Never Used  . Alcohol Use: No  . Drug Use: No  . Sexual Activity: Yes    Birth Control/ Protection: Sponge   Other Topics Concern  . None   Social History Narrative    Current outpatient prescriptions:  .  ibuprofen (ADVIL,MOTRIN) 600 MG tablet, Take 400 mg by mouth every 8 (eight) hours as needed., Disp: , Rfl:  .  Ostomy Supplies Pouch MISC, 1.5" Convex - 1 Piece Ostomy Appliance, Disp: 20 each, Rfl: 6 .  pantoprazole (PROTONIX) 40 MG tablet, Take 1 tablet (40 mg total) by mouth daily., Disp:  31 tablet, Rfl: 4 No Known Allergies     Review of Systems  Constitutional: Negative for fever, chills, activity change and appetite change.  HENT: Negative for congestion and sore throat.   Respiratory: Negative for cough and wheezing.   Cardiovascular: Negative for chest pain, palpitations and leg swelling.  Gastrointestinal: Negative for nausea, vomiting, abdominal pain, constipation, blood in stool and abdominal distention.  Genitourinary: Negative for dysuria, flank pain and difficulty urinating.  Musculoskeletal: Negative for joint swelling and arthralgias.  Skin: Negative for color change, pallor, rash and wound.  Neurological: Negative for  weakness and numbness.  Hematological: Negative for adenopathy. Does not bruise/bleed easily.  Psychiatric/Behavioral: Negative for decreased concentration. The patient is not nervous/anxious.   All other systems reviewed and are negative.      Filed Vitals:   10/29/15 0825  BP: 109/74  Pulse: 97  Temp: 97.7 F (36.5 C)    Objective:   Physical Exam  Constitutional: He is oriented to person, place, and time. He appears well-developed and well-nourished. No distress.  HENT:  Head: Normocephalic and atraumatic.  Right Ear: External ear normal.  Left Ear: External ear normal.  Nose: Nose normal.  Mouth/Throat: Oropharynx is clear and moist. No oropharyngeal exudate.  Eyes: Conjunctivae and EOM are normal. Pupils are equal, round, and reactive to light. No scleral icterus.  Neck: Normal range of motion. Neck supple. No tracheal deviation present.  Cardiovascular: Normal rate, regular rhythm, normal heart sounds and intact distal pulses.  Exam reveals no gallop and no friction rub.   No murmur heard. Pulmonary/Chest: Effort normal and breath sounds normal. No respiratory distress. He has no wheezes. He has no rales.  Abdominal: Soft. Bowel sounds are normal. He exhibits no distension. There is no tenderness. There is no rebound.  Well healed midline scar, ileostomy site pink patent with air and liquid in bag.   Musculoskeletal: Normal range of motion. He exhibits no edema or tenderness.  Neurological: He is alert and oriented to person, place, and time. No cranial nerve deficit.  Skin: Skin is warm and dry. No rash noted. No erythema. No pallor.  Psychiatric: He has a normal mood and affect. His behavior is normal. Judgment and thought content normal.  Vitals reviewed.  Imagining:  Barium Enema: only put contrast in ileostomy which is patent, no contrast through rectum, no evaluation of anastomosis     Assessment:     50 yr old male who had colovesical fistula with Partial  coloectomy with rectal anastomosis and ileostomy repair on 3/8, here for evaluation for ileostomy takedown.     Plan:     I spent approximately discussing the importance of smoking cessation with specific emphasis on healing after operation as well as healing his gastritis.  Patient understands and is in agreement, he is going to use the nicotine patches.  I also discussed the importance of using the protonix in the Am which he is doing.   I discussed with him that since the enema was not done through the rectum will reorder and make sure it has rectal contrast to evaluate the rectal anastomosis.  If all this looks normal without stenosis then we would be able to proceed with ileostomy takedown on May 16th.  The risks, benefits, complications, treatment options, and expected outcomes were discussed with the patient. The possibilities of bleeding, recurrent infection, perforation of viscus organs, damage to surrounding structures, needing a drain placed, the need for additional procedures, reaction to medication, pulmonary aspiration,  failure to diagnose a condition, and creating a complication requiring transfusion or operation were discussed with the patient. The patient was given the opportunity to ask questions and have them answered.  He is in agreement with the plan.

## 2015-10-30 ENCOUNTER — Ambulatory Visit
Admission: RE | Admit: 2015-10-30 | Discharge: 2015-10-30 | Disposition: A | Discharge status: Home / Self Care | Payer: Managed Care, Other (non HMO) | Source: Ambulatory Visit | Attending: Surgery | Admitting: Surgery

## 2015-10-30 ENCOUNTER — Telehealth: Payer: Self-pay | Admitting: Surgery

## 2015-10-30 ENCOUNTER — Other Ambulatory Visit
Admission: RE | Admit: 2015-10-30 | Discharge: 2015-10-30 | Disposition: A | Discharge status: Home / Self Care | Payer: Managed Care, Other (non HMO) | Source: Ambulatory Visit | Attending: Surgery | Admitting: Surgery

## 2015-10-30 DIAGNOSIS — N321 Vesicointestinal fistula: Secondary | ICD-10-CM | POA: Diagnosis present

## 2015-10-30 DIAGNOSIS — K5732 Diverticulitis of large intestine without perforation or abscess without bleeding: Secondary | ICD-10-CM | POA: Insufficient documentation

## 2015-10-30 DIAGNOSIS — R933 Abnormal findings on diagnostic imaging of other parts of digestive tract: Secondary | ICD-10-CM | POA: Diagnosis not present

## 2015-10-30 LAB — CBC WITH DIFFERENTIAL/PLATELET
Basophils Absolute: 0.1 10*3/uL (ref 0–0.1)
Basophils Relative: 1 %
Eosinophils Absolute: 0.2 10*3/uL (ref 0–0.7)
Eosinophils Relative: 2 %
HCT: 43.3 % (ref 40.0–52.0)
HEMOGLOBIN: 14.9 g/dL (ref 13.0–18.0)
LYMPHS ABS: 2 10*3/uL (ref 1.0–3.6)
LYMPHS PCT: 23 %
MCH: 30.4 pg (ref 26.0–34.0)
MCHC: 34.3 g/dL (ref 32.0–36.0)
MCV: 88.6 fL (ref 80.0–100.0)
Monocytes Absolute: 0.6 10*3/uL (ref 0.2–1.0)
Monocytes Relative: 7 %
NEUTROS PCT: 67 %
Neutro Abs: 6.1 10*3/uL (ref 1.4–6.5)
Platelets: 264 10*3/uL (ref 150–440)
RBC: 4.89 MIL/uL (ref 4.40–5.90)
RDW: 15.2 % — ABNORMAL HIGH (ref 11.5–14.5)
WBC: 9 10*3/uL (ref 3.8–10.6)

## 2015-10-30 LAB — BASIC METABOLIC PANEL
Anion gap: 9 (ref 5–15)
BUN: 10 mg/dL (ref 6–20)
CO2: 26 mmol/L (ref 22–32)
Calcium: 9.8 mg/dL (ref 8.9–10.3)
Chloride: 102 mmol/L (ref 101–111)
Creatinine, Ser: 0.84 mg/dL (ref 0.61–1.24)
GFR calc Af Amer: >60 mL/min (ref >60)
GFR calc non Af Amer: >60 mL/min (ref >60)
Glucose, Bld: 101 mg/dL — ABNORMAL HIGH (ref 65–99)
Potassium: 4.1 mmol/L (ref 3.5–5.1)
Sodium: 137 mmol/L (ref 135–145)

## 2015-10-30 NOTE — Telephone Encounter (Signed)
Pt advised of pre op date/time and sx date. Sx: 11/19/15 with Dr Loflin--Dr Thelma Bargeaks assisting--Open Ileostomy Takedown Pre op: 11/07/15 @ 8:15am--Office.  Patient made aware to call [phone], between 1-3:00pm the day before surgery, to find out what time to arrive.     Authorization has been obtained from [employer]--[id number]--effective dates: 11/19/15-11/20/15.

## 2015-11-07 ENCOUNTER — Encounter
Admission: RE | Admit: 2015-11-07 | Discharge: 2015-11-07 | Disposition: A | Discharge status: Home / Self Care | Payer: Managed Care, Other (non HMO) | Source: Ambulatory Visit | Attending: Surgery | Admitting: Surgery

## 2015-11-07 DIAGNOSIS — Z01818 Encounter for other preprocedural examination: Secondary | ICD-10-CM | POA: Diagnosis present

## 2015-11-07 HISTORY — DX: Low back pain, unspecified: M54.50

## 2015-11-07 HISTORY — DX: Low back pain: M54.5

## 2015-11-07 LAB — SURGICAL PCR SCREEN
MRSA, PCR: POSITIVE — AB
Staphylococcus aureus: POSITIVE — AB

## 2015-11-07 NOTE — Pre-Procedure Instructions (Signed)
MRSA results called and faxed to Dr. Orvis BrillLoflin office.

## 2015-11-07 NOTE — Patient Instructions (Signed)
  Your procedure is scheduled on: Nov 19, 2015 (Tuesday) Report to Day Surgery.([facility]) SECOND FLOOR To find out your arrival time please call [phone] between 1PM - 3PM on Nov 18, 2015 (Monday).  Remember: Instructions that are not followed completely may result in serious medical risk, up to and including death, or upon the discretion of your surgeon and anesthesiologist your surgery may need to be rescheduled.    __x__ 1. Do not eat food or drink liquids after midnight. No gum chewing or hard candies.     __x__ 2. No Alcohol for 24 hours before or after surgery.   ____ 3. Bring all medications with you on the day of surgery if instructed.    __x__ 4. Notify your doctor if there is any change in your medical condition     (cold, fever, infections).     Do not wear jewelry, make-up, hairpins, clips or nail polish.  Do not wear lotions, powders, or perfumes. You may wear deodorant.  Do not shave 48 hours prior to surgery. Men may shave face and neck.  Do not bring valuables to the hospital.    [facility] for any belongings or valuables.               Contacts, dentures or bridgework may not be worn into surgery.  Leave your suitcase in the car. After surgery it may be brought to your room.  For patients admitted to the hospital, discharge time is determined by your                treatment team.   Patients discharged the day of surgery will not be allowed to drive home.   Please read over the following fact sheets that you were given:   MRSA Information and Surgical Site Infection Prevention   _x_ Take these medicines the morning of surgery with A SIP OF WATER:    1. Protonix (Protonix at bedtime on May 15)  2.   3.   4.  5.  6.  ____ Fleet Enema (as directed)   __x__ Use CHG Soap as directed  ____ Use inhalers on the day of surgery  ____ Stop metformin 2 days prior to surgery    ____ Take 1/2 of usual insulin dose the night before  surgery and none on the morning of surgery.   __x__ Stop Coumadin/Plavix/aspirin on (N/A)  __x__ Stop Anti-inflammatories on (Stop Ibuprofen one week before surgery) Tylenol ok to take for pain if needed   ____ Stop supplements until after surgery.    ____ Bring C-Pap to the hospital.

## 2015-11-19 ENCOUNTER — Inpatient Hospital Stay: Payer: Managed Care, Other (non HMO) | Admitting: Anesthesiology

## 2015-11-19 ENCOUNTER — Inpatient Hospital Stay
Admission: RE | Admit: 2015-11-19 | Discharge: 2015-11-22 | DRG: 331 | Disposition: A | Discharge status: Home / Self Care | Payer: Managed Care, Other (non HMO) | Source: Ambulatory Visit | Attending: Surgery | Admitting: Surgery

## 2015-11-19 ENCOUNTER — Encounter
Admission: RE | Disposition: A | Discharge status: Home / Self Care | Payer: Self-pay | Source: Ambulatory Visit | Attending: Surgery

## 2015-11-19 ENCOUNTER — Encounter: Payer: Self-pay | Admitting: *Deleted

## 2015-11-19 DIAGNOSIS — M47892 Other spondylosis, cervical region: Secondary | ICD-10-CM | POA: Diagnosis present

## 2015-11-19 DIAGNOSIS — Z9889 Other specified postprocedural states: Secondary | ICD-10-CM

## 2015-11-19 DIAGNOSIS — F17210 Nicotine dependence, cigarettes, uncomplicated: Secondary | ICD-10-CM | POA: Diagnosis present

## 2015-11-19 DIAGNOSIS — E860 Dehydration: Secondary | ICD-10-CM | POA: Diagnosis present

## 2015-11-19 DIAGNOSIS — J449 Chronic obstructive pulmonary disease, unspecified: Secondary | ICD-10-CM | POA: Diagnosis present

## 2015-11-19 DIAGNOSIS — Z825 Family history of asthma and other chronic lower respiratory diseases: Secondary | ICD-10-CM | POA: Diagnosis not present

## 2015-11-19 DIAGNOSIS — M19019 Primary osteoarthritis, unspecified shoulder: Secondary | ICD-10-CM | POA: Diagnosis present

## 2015-11-19 DIAGNOSIS — Z79899 Other long term (current) drug therapy: Secondary | ICD-10-CM | POA: Diagnosis not present

## 2015-11-19 DIAGNOSIS — Z833 Family history of diabetes mellitus: Secondary | ICD-10-CM | POA: Diagnosis not present

## 2015-11-19 DIAGNOSIS — Z432 Encounter for attention to ileostomy: Principal | ICD-10-CM

## 2015-11-19 DIAGNOSIS — Z8042 Family history of malignant neoplasm of prostate: Secondary | ICD-10-CM

## 2015-11-19 DIAGNOSIS — K219 Gastro-esophageal reflux disease without esophagitis: Secondary | ICD-10-CM | POA: Diagnosis present

## 2015-11-19 DIAGNOSIS — Z932 Ileostomy status: Secondary | ICD-10-CM

## 2015-11-19 DIAGNOSIS — Z801 Family history of malignant neoplasm of trachea, bronchus and lung: Secondary | ICD-10-CM | POA: Diagnosis not present

## 2015-11-19 HISTORY — PX: ILEOSTOMY CLOSURE: SHX1784

## 2015-11-19 SURGERY — CLOSURE, ILEOSTOMY
Anesthesia: General | Wound class: Clean Contaminated

## 2015-11-19 MED ORDER — LIDOCAINE HCL (CARDIAC) 20 MG/ML IV SOLN
INTRAVENOUS | Status: DC | PRN
  Administered 2015-11-19: 40 mg via INTRAVENOUS

## 2015-11-19 MED ORDER — PANTOPRAZOLE SODIUM 40 MG PO TBEC
40.0000 mg | DELAYED_RELEASE_TABLET | Freq: Every day | ORAL | Status: DC
  Administered 2015-11-19 – 2015-11-22 (×4): 40 mg via ORAL
  Filled 2015-11-19 (×4): qty 1

## 2015-11-19 MED ORDER — GLYCOPYRROLATE 0.2 MG/ML IJ SOLN
INTRAMUSCULAR | Status: DC | PRN
  Administered 2015-11-19: 0.6 mg via INTRAVENOUS

## 2015-11-19 MED ORDER — ENOXAPARIN SODIUM 40 MG/0.4ML ~~LOC~~ SOLN
40.0000 mg | SUBCUTANEOUS | Status: DC
  Administered 2015-11-19 – 2015-11-22 (×4): 40 mg via SUBCUTANEOUS
  Filled 2015-11-19 (×4): qty 0.4

## 2015-11-19 MED ORDER — NEOSTIGMINE METHYLSULFATE 10 MG/10ML IV SOLN
INTRAVENOUS | Status: DC | PRN
  Administered 2015-11-19: 4 mg via INTRAVENOUS

## 2015-11-19 MED ORDER — CHLORHEXIDINE GLUCONATE 4 % EX LIQD: 1.0000 "application " | Freq: Once | CUTANEOUS | Status: DC

## 2015-11-19 MED ORDER — MIDAZOLAM HCL 2 MG/2ML IJ SOLN
INTRAMUSCULAR | Status: DC | PRN
  Administered 2015-11-19: 2 mg via INTRAVENOUS

## 2015-11-19 MED ORDER — FENTANYL CITRATE (PF) 100 MCG/2ML IJ SOLN
INTRAMUSCULAR | Status: AC
  Administered 2015-11-19: 25 ug via INTRAVENOUS
  Filled 2015-11-19: qty 2

## 2015-11-19 MED ORDER — FENTANYL CITRATE (PF) 100 MCG/2ML IJ SOLN
INTRAMUSCULAR | Status: DC | PRN
  Administered 2015-11-19: 50 ug via INTRAVENOUS
  Administered 2015-11-19: 100 ug via INTRAVENOUS
  Administered 2015-11-19: 50 ug via INTRAVENOUS

## 2015-11-19 MED ORDER — HYDRALAZINE HCL 20 MG/ML IJ SOLN: 10.0000 mg | INTRAMUSCULAR | Status: DC | PRN

## 2015-11-19 MED ORDER — ROCURONIUM BROMIDE 100 MG/10ML IV SOLN
INTRAVENOUS | Status: DC | PRN
  Administered 2015-11-19: 10 mg via INTRAVENOUS
  Administered 2015-11-19: 40 mg via INTRAVENOUS

## 2015-11-19 MED ORDER — KETOROLAC TROMETHAMINE 30 MG/ML IJ SOLN
INTRAMUSCULAR | Status: DC | PRN
  Administered 2015-11-19: 30 mg via INTRAVENOUS

## 2015-11-19 MED ORDER — FENTANYL CITRATE (PF) 100 MCG/2ML IJ SOLN
25.0000 ug | INTRAMUSCULAR | Status: DC | PRN
  Administered 2015-11-19 (×4): 25 ug via INTRAVENOUS

## 2015-11-19 MED ORDER — CEFOXITIN SODIUM-DEXTROSE 2-2.2 GM-% IV SOLR (PREMIX)
INTRAVENOUS | Status: AC
  Administered 2015-11-19: 2000 mg
  Filled 2015-11-19: qty 50

## 2015-11-19 MED ORDER — OXYCODONE HCL 5 MG PO TABS
10.0000 mg | ORAL_TABLET | ORAL | Status: DC | PRN
  Administered 2015-11-19 – 2015-11-20 (×3): 10 mg via ORAL
  Filled 2015-11-19 (×3): qty 2

## 2015-11-19 MED ORDER — ACETAMINOPHEN 500 MG PO TABS
1000.0000 mg | ORAL_TABLET | Freq: Four times a day (QID) | ORAL | Status: DC
  Administered 2015-11-19 – 2015-11-22 (×11): 1000 mg via ORAL
  Filled 2015-11-19 (×11): qty 2

## 2015-11-19 MED ORDER — MORPHINE SULFATE (PF) 2 MG/ML IV SOLN
2.0000 mg | INTRAVENOUS | Status: DC | PRN
  Administered 2015-11-19 (×3): 2 mg via INTRAVENOUS
  Filled 2015-11-19 (×3): qty 1

## 2015-11-19 MED ORDER — OXYCODONE HCL 5 MG PO TABS
5.0000 mg | ORAL_TABLET | ORAL | Status: DC | PRN
  Administered 2015-11-19: 5 mg via ORAL
  Filled 2015-11-19: qty 1

## 2015-11-19 MED ORDER — ACETAMINOPHEN 10 MG/ML IV SOLN
INTRAVENOUS | Status: DC | PRN
  Administered 2015-11-19: 1000 mg via INTRAVENOUS

## 2015-11-19 MED ORDER — NICOTINE 14 MG/24HR TD PT24
14.0000 mg | MEDICATED_PATCH | Freq: Every day | TRANSDERMAL | Status: DC
  Administered 2015-11-19 – 2015-11-22 (×4): 14 mg via TRANSDERMAL
  Filled 2015-11-19 (×4): qty 1

## 2015-11-19 MED ORDER — ONDANSETRON 8 MG PO TBDP: 4.0000 mg | ORAL_TABLET | Freq: Four times a day (QID) | ORAL | Status: DC | PRN

## 2015-11-19 MED ORDER — KETOROLAC TROMETHAMINE 30 MG/ML IJ SOLN
30.0000 mg | Freq: Four times a day (QID) | INTRAMUSCULAR | Status: DC
  Administered 2015-11-19 – 2015-11-22 (×12): 30 mg via INTRAVENOUS
  Filled 2015-11-19 (×12): qty 1

## 2015-11-19 MED ORDER — ONDANSETRON HCL 4 MG/2ML IJ SOLN: 4.0000 mg | Freq: Once | INTRAMUSCULAR | Status: DC | PRN

## 2015-11-19 MED ORDER — PHENYLEPHRINE HCL 10 MG/ML IJ SOLN: INTRAMUSCULAR | Status: DC | PRN

## 2015-11-19 MED ORDER — PROPOFOL 10 MG/ML IV BOLUS
INTRAVENOUS | Status: DC | PRN
  Administered 2015-11-19: 130 mg via INTRAVENOUS

## 2015-11-19 MED ORDER — ONDANSETRON HCL 4 MG/2ML IJ SOLN
INTRAMUSCULAR | Status: DC | PRN
  Administered 2015-11-19: 4 mg via INTRAVENOUS

## 2015-11-19 MED ORDER — DEXTROSE 5 % IV SOLN
2.0000 g | Freq: Once | INTRAVENOUS | Status: AC
  Administered 2015-11-19: 2 g via INTRAVENOUS
  Filled 2015-11-19: qty 2

## 2015-11-19 MED ORDER — CYCLOBENZAPRINE HCL 10 MG PO TABS
10.0000 mg | ORAL_TABLET | Freq: Three times a day (TID) | ORAL | Status: DC
  Administered 2015-11-19 – 2015-11-22 (×10): 10 mg via ORAL
  Filled 2015-11-19 (×10): qty 1

## 2015-11-19 MED ORDER — LACTATED RINGERS IV SOLN
INTRAVENOUS | Status: DC
  Administered 2015-11-19 (×2): via INTRAVENOUS

## 2015-11-19 MED ORDER — ONDANSETRON HCL 4 MG/2ML IJ SOLN
4.0000 mg | Freq: Four times a day (QID) | INTRAMUSCULAR | Status: DC | PRN
  Filled 2015-11-19: qty 2

## 2015-11-19 MED ORDER — LACTATED RINGERS IV SOLN
INTRAVENOUS | Status: DC
  Administered 2015-11-19 – 2015-11-22 (×5): via INTRAVENOUS

## 2015-11-19 MED ORDER — ACETAMINOPHEN 10 MG/ML IV SOLN
INTRAVENOUS | Status: AC
Start: 2015-11-19 — End: 2015-11-19
  Filled 2015-11-19: qty 100

## 2015-11-19 SURGICAL SUPPLY — 50 items
BLADE CLIPPER SURG (BLADE) ×2 IMPLANT
BLADE SURG 15 STRL LF DISP TIS (BLADE) ×1 IMPLANT
BLADE SURG 15 STRL SS (BLADE) ×1
CANISTER SUCT 1200ML W/VALVE (MISCELLANEOUS) ×2 IMPLANT
CATH TRAY 16F METER LATEX (MISCELLANEOUS) IMPLANT
DRAIN PENROSE 5/8X18 LTX STRL (WOUND CARE) ×2 IMPLANT
DRAPE INCISE IOBAN 66X45 STRL (DRAPES) IMPLANT
DRAPE LAPAROTOMY 100X77 ABD (DRAPES) ×2 IMPLANT
DRAPE LEGGINS SURG 28X43 STRL (DRAPES) IMPLANT
DRAPE TABLE BACK 80X90 (DRAPES) IMPLANT
DRAPE UNDER BUTTOCK W/FLU (DRAPES) IMPLANT
DRAPE UTILITY 15X26 TOWEL STRL (DRAPES) IMPLANT
DRSG OPSITE POSTOP 4X14 (GAUZE/BANDAGES/DRESSINGS) IMPLANT
DRSG OPSITE POSTOP 4X6 (GAUZE/BANDAGES/DRESSINGS) IMPLANT
ELECT BLADE 6.5 EXT (BLADE) IMPLANT
ELECT CAUTERY BLADE 6.4 (BLADE) ×2 IMPLANT
ELECT REM PT RETURN 9FT ADLT (ELECTROSURGICAL) ×2
ELECTRODE REM PT RTRN 9FT ADLT (ELECTROSURGICAL) ×1 IMPLANT
GAUZE SPONGE 4X4 12PLY STRL (GAUZE/BANDAGES/DRESSINGS) ×2 IMPLANT
GLOVE BIO SURGEON STRL SZ7.5 (GLOVE) ×8 IMPLANT
GLOVE INDICATOR 8.0 STRL GRN (GLOVE) ×2 IMPLANT
GOWN STRL REUS W/ TWL LRG LVL3 (GOWN DISPOSABLE) ×3 IMPLANT
GOWN STRL REUS W/ TWL XL LVL3 (GOWN DISPOSABLE) IMPLANT
GOWN STRL REUS W/TWL LRG LVL3 (GOWN DISPOSABLE) ×3
GOWN STRL REUS W/TWL XL LVL3 (GOWN DISPOSABLE)
LABEL OR SOLS (LABEL) IMPLANT
LIGASURE MARYLAND LAP STAND (ELECTROSURGICAL) IMPLANT
NS IRRIG 1000ML POUR BTL (IV SOLUTION) ×2 IMPLANT
PACK BASIN MAJOR ARMC (MISCELLANEOUS) ×2 IMPLANT
PACK COLON CLEAN CLOSURE (MISCELLANEOUS) IMPLANT
PAD ABD DERMACEA PRESS 5X9 (GAUZE/BANDAGES/DRESSINGS) ×2 IMPLANT
RELOAD PROXIMATE 75MM BLUE (ENDOMECHANICALS) ×2 IMPLANT
RETAINER VISCERA MED (MISCELLANEOUS) IMPLANT
SOL PREP PVP 2OZ (MISCELLANEOUS)
SOLUTION PREP PVP 2OZ (MISCELLANEOUS) IMPLANT
SPONGE KITTNER 5P (MISCELLANEOUS) ×2 IMPLANT
SPONGE LAP 18X18 5 PK (GAUZE/BANDAGES/DRESSINGS) IMPLANT
STAPLER PROXIMATE 75MM BLUE (STAPLE) ×2 IMPLANT
STAPLER SKIN PROX 35W (STAPLE) ×2 IMPLANT
SUT NYLON 2-0 (SUTURE) IMPLANT
SUT PDS AB 1 TP1 96 (SUTURE) IMPLANT
SUT PDS AB 2-0 CT1 27 (SUTURE) ×2 IMPLANT
SUT SILK 3-0 (SUTURE) ×2 IMPLANT
SUT VIC AB 1 CTX 27 (SUTURE) IMPLANT
SUT VIC AB 2-0 CT1 27 (SUTURE) ×1
SUT VIC AB 2-0 CT1 TAPERPNT 27 (SUTURE) ×1 IMPLANT
SUT VIC AB 3-0 SH 27 (SUTURE)
SUT VIC AB 3-0 SH 27X BRD (SUTURE) IMPLANT
SUT VICRYL PLUS ABS 0 54 (SUTURE) IMPLANT
SYR BULB IRRIG 60ML STRL (SYRINGE) ×2 IMPLANT

## 2015-11-19 NOTE — Transfer of Care (Signed)
Immediate Anesthesia Transfer of Care Note  Patient: Peter [name]  Procedure(s) Performed: Procedure(s): ILEOSTOMY TAKEDOWN (N/A)  Patient Location: PACU  Anesthesia Type:General  Level of Consciousness: sedated  Airway & Oxygen Therapy: Patient Spontanous Breathing and Patient connected to face mask oxygen  Post-op Assessment: Report given to RN and Post -op Vital signs reviewed and stable  Post vital signs: Reviewed and stable  Last Vitals:  Filed Vitals:   11/19/15 0612 11/19/15 0900  BP: 114/78 141/93  Pulse: 97 74  Temp: 36.8 C 37.1 C  Resp: 16 16    Last Pain:  Filed Vitals:   11/19/15 0903  PainSc: 0-No pain         Complications: No apparent anesthesia complications

## 2015-11-19 NOTE — H&P (View-Only) (Signed)
Subjective:     Patient ID: Peter [name], male   DOB: [birth year], 50 y.o.   MRN: [id number]  HPI  50 yr old male who had colovesical fistula with Partial coloectomy with rectal anastomosis and ileostomy repair on 3/8.  Patient had been doing well since then.  He did have some issues with dehydration but is improving of that.  He has unfortunately started smoking again and is up to 1/2 ppd now.  He stated that the Chantix worked but was too expensive to afford at this time but that the nicotine patches helped him.  He understands the need to quit and is willing to do this.  He states that he is only using ibuprofen occasionally for pain and soreness.  He does still have some epigastric pain in AM prior to breakfast. He states that the protonix is helping with this.    Past Medical History  Diagnosis Date  . GERD (gastroesophageal reflux disease)   . Generalized headaches   . Rectal pain   . Thrombosed hemorrhoids   . Diverticulitis   . COPD (chronic obstructive pulmonary disease) (HCC)   . Arthritis     neck, shoulders   Past Surgical History  Procedure Laterality Date  . Cholecystectomy  1989  . Tonsillectomy  1970  . Appendectomy  1988  . Colonoscopy with propofol N/A 08/16/2015    Procedure: COLONOSCOPY WITH PROPOFOL;  Surgeon: Midge Minium, MD;  Location: [facility];  Service: Endoscopy;  Laterality: N/A;  . Polypectomy  08/16/2015    Procedure: POLYPECTOMY;  Surgeon: Midge Minium, MD;  Location: [facility];  Service: Endoscopy;;  . Laparoscopic partial colectomy N/A 09/11/2015    Procedure: LAPAROSCOPIC PARTIAL COLECTOMY Possible Open, possible ostomy, repair of colovesical fistula Cystoscopy and stent placement, repair of colovesical fistula with Dr. Sherryl Barters as well ;  Surgeon: Gladis Riffle, MD;  Location: [facility];  Service: General;  Laterality: N/A;  . Cystoscopy with stent placement Bilateral 09/11/2015    Procedure: CYSTOSCOPY WITH STENT PLACEMENT;  Surgeon: Hildred Laser, MD;  Location: [facility];  Service: Urology;  Laterality: Bilateral;  . Bladder repair N/A 09/11/2015    Procedure: BLADDER REPAIR;  Surgeon: Hildred Laser, MD;  Location: [facility];  Service: Urology;  Laterality: N/A;  . Colon surgery     Family History  Problem Relation Age of Onset  . Cholecystitis Mother   . Lung cancer Father   . Emphysema Paternal Grandmother   . Diabetes Paternal Grandmother   . Prostate cancer Maternal Uncle    Social History   Social History  . Marital Status: Single    Spouse Name: N/A  . Number of Children: N/A  . Years of Education: N/A   Social History Main Topics  . Smoking status: Current Every Day Smoker -- 0.50 packs/day for 30 years    Types: Cigarettes  . Smokeless tobacco: Never Used  . Alcohol Use: No  . Drug Use: No  . Sexual Activity: Yes    Birth Control/ Protection: Sponge   Other Topics Concern  . None   Social History Narrative    Current outpatient prescriptions:  .  ibuprofen (ADVIL,MOTRIN) 600 MG tablet, Take 400 mg by mouth every 8 (eight) hours as needed., Disp: , Rfl:  .  Ostomy Supplies Pouch MISC, 1.5" Convex - 1 Piece Ostomy Appliance, Disp: 20 each, Rfl: 6 .  pantoprazole (PROTONIX) 40 MG tablet, Take 1 tablet (40 mg total) by mouth daily., Disp:  31 tablet, Rfl: 4 No Known Allergies     Review of Systems  Constitutional: Negative for fever, chills, activity change and appetite change.  HENT: Negative for congestion and sore throat.   Respiratory: Negative for cough and wheezing.   Cardiovascular: Negative for chest pain, palpitations and leg swelling.  Gastrointestinal: Negative for nausea, vomiting, abdominal pain, constipation, blood in stool and abdominal distention.  Genitourinary: Negative for dysuria, flank pain and difficulty urinating.  Musculoskeletal: Negative for joint swelling and arthralgias.  Skin: Negative for color change, pallor, rash and wound.  Neurological: Negative for  weakness and numbness.  Hematological: Negative for adenopathy. Does not bruise/bleed easily.  Psychiatric/Behavioral: Negative for decreased concentration. The patient is not nervous/anxious.   All other systems reviewed and are negative.      Filed Vitals:   10/29/15 0825  BP: 109/74  Pulse: 97  Temp: 97.7 F (36.5 C)    Objective:   Physical Exam  Constitutional: He is oriented to person, place, and time. He appears well-developed and well-nourished. No distress.  HENT:  Head: Normocephalic and atraumatic.  Right Ear: External ear normal.  Left Ear: External ear normal.  Nose: Nose normal.  Mouth/Throat: Oropharynx is clear and moist. No oropharyngeal exudate.  Eyes: Conjunctivae and EOM are normal. Pupils are equal, round, and reactive to light. No scleral icterus.  Neck: Normal range of motion. Neck supple. No tracheal deviation present.  Cardiovascular: Normal rate, regular rhythm, normal heart sounds and intact distal pulses.  Exam reveals no gallop and no friction rub.   No murmur heard. Pulmonary/Chest: Effort normal and breath sounds normal. No respiratory distress. He has no wheezes. He has no rales.  Abdominal: Soft. Bowel sounds are normal. He exhibits no distension. There is no tenderness. There is no rebound.  Well healed midline scar, ileostomy site pink patent with air and liquid in bag.   Musculoskeletal: Normal range of motion. He exhibits no edema or tenderness.  Neurological: He is alert and oriented to person, place, and time. No cranial nerve deficit.  Skin: Skin is warm and dry. No rash noted. No erythema. No pallor.  Psychiatric: He has a normal mood and affect. His behavior is normal. Judgment and thought content normal.  Vitals reviewed.  Imagining:  Barium Enema: only put contrast in ileostomy which is patent, no contrast through rectum, no evaluation of anastomosis     Assessment:     50 yr old male who had colovesical fistula with Partial  coloectomy with rectal anastomosis and ileostomy repair on 3/8, here for evaluation for ileostomy takedown.     Plan:     I spent approximately 15minutes discussing the importance of smoking cessation with specific emphasis on healing after operation as well as healing his gastritis.  Patient understands and is in agreement, he is going to use the nicotine patches.  I also discussed the importance of using the protonix in the Am which he is doing.   I discussed with him that since the enema was not done through the rectum will reorder and make sure it has rectal contrast to evaluate the rectal anastomosis.  If all this looks normal without stenosis then we would be able to proceed with ileostomy takedown on May 16th.  The risks, benefits, complications, treatment options, and expected outcomes were discussed with the patient. The possibilities of bleeding, recurrent infection, perforation of viscus organs, damage to surrounding structures, needing a drain placed, the need for additional procedures, reaction to medication, pulmonary aspiration,    failure to diagnose a condition, and creating a complication requiring transfusion or operation were discussed with the patient. The patient was given the opportunity to ask questions and have them answered.  He is in agreement with the plan.         

## 2015-11-19 NOTE — Anesthesia Preprocedure Evaluation (Addendum)
Anesthesia Evaluation  Patient identified by MRN, date of birth, ID band Patient awake    Reviewed: Allergy & Precautions, NPO status , Patient's Chart, lab work & pertinent test results, reviewed documented beta blocker date and time   Airway Mallampati: II  TM Distance: >3 FB     Dental  (+) Chipped   Pulmonary COPD, Current Smoker,           Cardiovascular      Neuro/Psych  Headaches, Anxiety  Neuromuscular disease    GI/Hepatic GERD  Controlled,  Endo/Other    Renal/GU      Musculoskeletal  (+) Arthritis ,   Abdominal   Peds  Hematology   Anesthesia Other Findings Good neck movement.  Reproductive/Obstetrics                            Anesthesia Physical Anesthesia Plan  ASA: II  Anesthesia Plan: General   Post-op Pain Management:    Induction: Intravenous  Airway Management Planned: Oral ETT  Additional Equipment:   Intra-op Plan:   Post-operative Plan:   Informed Consent: I have reviewed the patients History and Physical, chart, labs and discussed the procedure including the risks, benefits and alternatives for the proposed anesthesia with the patient or authorized representative who has indicated his/her understanding and acceptance.     Plan Discussed with: CRNA  Anesthesia Plan Comments:         Anesthesia Quick Evaluation

## 2015-11-19 NOTE — Anesthesia Procedure Notes (Signed)
Procedure Name: Intubation Date/Time: 11/19/2015 7:40 AM Performed by: Henrietta HooverPOPE, Sheron Tallman Pre-anesthesia Checklist: Patient identified, Emergency Drugs available, Suction available, Patient being monitored and Timeout performed Patient Re-evaluated:Patient Re-evaluated prior to inductionOxygen Delivery Method: Circle system utilized Preoxygenation: Pre-oxygenation with 100% oxygen Intubation Type: IV induction Ventilation: Mask ventilation with difficulty Grade View: Grade II Tube type: Oral Tube size: 7.5 mm Number of attempts: 1 Airway Equipment and Method: Stylet Placement Confirmation: ETT inserted through vocal cords under direct vision,  positive ETCO2 and breath sounds checked- equal and bilateral Secured at: 22 cm Tube secured with: Tape Dental Injury: Teeth and Oropharynx as per pre-operative assessment  Comments: Goatee makes mask fit difficult, ablel to ventilate

## 2015-11-19 NOTE — Op Note (Signed)
Ileostomy takedown Procedure Note  Indications: The patient presented with a history of colovesical fistula with colectomy and ileostomy creation.  Patient has done well with this and now has healed and no longer needs ileostomy.   Pre-operative Diagnosis: Ileostomy  Post-operative Diagnosis: same  Surgeon: Gladis Riffleatherine L Jacquelyn Shadrick   Assistants: Dr. Westley Gamblesim Oaks  Anesthesia: General endotracheal anesthesia  ASA Class: 2  Procedure Details  The patient was seen again in the Holding Room. The risks, benefits, complications, treatment options, and expected outcomes were discussed with the patient and/or family. The possibilities of reaction to medication, pulmonary aspiration, perforation of viscus, bleeding, recurrent infection, the need for additional procedures, and creating a complication requiring transfusion or operation were discussed. There was concurrence with the proposed plan and informed consent was obtained. The site of surgery was properly noted/marked. The patient was taken to Operating Room, identified as Peter [name] and the procedure verified as Ileostomy takedown. A Time Out was held and the above information confirmed.  The patient was placed in the supine position and general anesthesia was induced.  The abdomen was prepped and draped in a sterile fashion. The ileostomy was examined in a 15 blade scalpel was used to take the ileum down from the skin and take the adhesions down once this was done the bowel was eviscerated through the ileostomy site. The bowel appeared healthy and well perfused. A GIA stapler was then placed into both ends of the loop ileostomy and a side to side anastomosis created. Allis clamps were used to close the end areas of the ileostomy and a stapler was thrown across the end to close the anastomosis and resect a 1cm portion of the ileum. Two lembert sutures of 3-0 silk were used to lembert the corners of the anastomosis and one stitch at the crotch of the  anastomosis to decrease tension. At this point the ileum was attempted replaced back in to the abdomen but there were still some adhesions along the fascia and the fascia was clamped with the Coker and elevated up. The adhesions were taken down sharply with Metzenbaum scissors. The ileum and anastomosis were gently placed into the abdomen sitting in a good position.   2-0 PDS was then run to close the fascial defect. The wound was then irrigated out. The skin along the edge was resected using Metzenbaum scissors. The wound was again irrigated out and hemostasis was achieved with Bovie electrocautery. A large Penrose drain was then placed in the wound was stapled. Dry dressings were applied.   Instrument, sponge, and needle counts were correct at the conclusion of the case.   Findings:  Healthy ileum at the ileostomy site, good side to side stapled anastomosis   Estimated Blood Loss:  Minimal         Drains: penrose to wound         Total IV Fluids: 1000mL         Specimens: none         Complications:  None; patient tolerated the procedure well.         Disposition: PACU - hemodynamically stable.         Condition: stable

## 2015-11-19 NOTE — Interval H&P Note (Signed)
History and Physical Interval Note:  11/19/2015 7:18 AM  [name]  has presented today for surgery, with the diagnosis of Colovesical fistula, ileostomy  The various methods of treatment have been discussed with the patient and family. After consideration of risks, benefits and other options for treatment, the patient has consented to  Procedure(s): ILEOSTOMY TAKEDOWN (N/A) as a surgical intervention .  The patient's history has been reviewed, patient examined, no change in status, stable for surgery.  I have reviewed the patient's chart and labs.  Questions were answered to the patient's satisfaction.     Catherine L Loflin

## 2015-11-19 NOTE — Anesthesia Postprocedure Evaluation (Signed)
Anesthesia Post Note  Patient: Scientist, [name]  Procedure(s) Performed: Procedure(s) (LRB): ILEOSTOMY TAKEDOWN (N/A)  Patient location during evaluation: PACU Anesthesia Type: General Level of consciousness: awake and alert Pain management: pain level controlled Vital Signs Assessment: post-procedure vital signs reviewed and stable Respiratory status: spontaneous breathing, nonlabored ventilation, respiratory function stable and patient connected to nasal cannula oxygen Cardiovascular status: blood pressure returned to baseline and stable Postop Assessment: no signs of nausea or vomiting Anesthetic complications: no    Last Vitals:  Filed Vitals:   11/19/15 1053 11/19/15 1409  BP: 113/77 115/73  Pulse: 79 80  Temp: 36.6 C 36.7 C  Resp: 17 16    Last Pain:  Filed Vitals:   11/19/15 1839  PainSc: 9                  Wlliam Grosso S

## 2015-11-20 LAB — CBC
HCT: 36.1 % — ABNORMAL LOW (ref 40.0–52.0)
Hemoglobin: 12.4 g/dL — ABNORMAL LOW (ref 13.0–18.0)
MCH: 30.6 pg (ref 26.0–34.0)
MCHC: 34.5 g/dL (ref 32.0–36.0)
MCV: 88.7 fL (ref 80.0–100.0)
PLATELETS: 195 10*3/uL (ref 150–440)
RBC: 4.07 MIL/uL — AB (ref 4.40–5.90)
RDW: 14.6 % — AB (ref 11.5–14.5)
WBC: 6.8 10*3/uL (ref 3.8–10.6)

## 2015-11-20 LAB — BASIC METABOLIC PANEL
Anion gap: 6 (ref 5–15)
BUN: 10 mg/dL (ref 6–20)
CALCIUM: 8.6 mg/dL — AB (ref 8.9–10.3)
CO2: 28 mmol/L (ref 22–32)
CREATININE: 0.91 mg/dL (ref 0.61–1.24)
Chloride: 102 mmol/L (ref 101–111)
GFR calc Af Amer: >60 mL/min (ref >60)
GLUCOSE: 93 mg/dL (ref 65–99)
Potassium: 3.7 mmol/L (ref 3.5–5.1)
SODIUM: 136 mmol/L (ref 135–145)

## 2015-11-20 MED ORDER — OXYCODONE HCL 5 MG PO TABS
15.0000 mg | ORAL_TABLET | ORAL | Status: DC | PRN
  Administered 2015-11-20 – 2015-11-22 (×13): 15 mg via ORAL
  Filled 2015-11-20 (×13): qty 3

## 2015-11-20 NOTE — Progress Notes (Signed)
50 yr old male POD#1 from ileostomy takedown.  Patient doing well today.  States a lot of drainage from the wound.  Patient states that he is having significant pain around the incision site especially with movement.  He states that the oxycodone is not helping much with the pain.  He has passed some gas and some mucous from the rectum.   Filed Vitals:   11/20/15 0440 11/20/15 1213  BP: 93/55 101/64  Pulse: 67 84  Temp:  97.7 F (36.5 C)  Resp:  18   I/O last 3 completed shifts: In: 2290 [P.O.:540; I.V.:1700; IV Piggyback:50] Out: 1325 [Urine:1275; Blood:50] Total I/O In: 778 [P.O.:240; I.V.:538] Out: 0    PE:  Gen: NAD  Abd: soft, mildly distended, appropriately tender, incision clean with serous drainage on bandage, penrose drain in place.  Ext: 2+ pulses, no edema  CBC Latest Ref Rng 11/20/2015 10/30/2015 09/22/2015  WBC 3.8 - 10.6 K/uL 6.8 9.0 7.8  Hemoglobin 13.0 - 18.0 g/dL 12.4(L) 14.9 12.6(L)  Hematocrit 40.0 - 52.0 % 36.1(L) 43.3 36.2(L)  Platelets 150 - 440 K/uL 195 264 295   CMP Latest Ref Rng 11/20/2015 10/30/2015 09/22/2015  Glucose 65 - 99 mg/dL 93 962(X101(H) 528(U103(H)  BUN 6 - 20 mg/dL 10 10 14   Creatinine 0.61 - 1.24 mg/dL 1.320.91 4.400.84 1.020.95  Sodium 135 - 145 mmol/L 136 137 134(L)  Potassium 3.5 - 5.1 mmol/L 3.7 4.1 3.8  Chloride 101 - 111 mmol/L 102 102 101  CO2 22 - 32 mmol/L 28 26 26   Calcium 8.9 - 10.3 mg/dL 7.2(Z8.6(L) 9.8 3.6(U8.5(L)  Total Protein 6.5 - 8.1 g/dL - - -  Total Bilirubin 0.3 - 1.2 mg/dL - - -  Alkaline Phos 38 - 126 U/L - - -  AST 15 - 41 U/L - - -  ALT 17 - 63 U/L - - -    A/P: 50 yr old male POD#1 from ileostomy takedown. Pain: scheduled tylenol, toradol, flexeril and prn oxycodone increased to 15mg  q 3 and prn Morphine iv Awaiting bowel function, will give full liquid diet today Encourage ambulation

## 2015-11-21 NOTE — Progress Notes (Signed)
50 yr old male POD#2 from ileostomy takedown.  Patient doing well today.  He has been up and moving around well. He tolerated full liquid diet well and had 3 loose BMs.   Filed Vitals:   11/21/15 1301 11/21/15 1353  BP: 94/55 106/62  Pulse: 83   Temp: 98.7 F (37.1 C)   Resp: 17    I/O last 3 completed shifts: In: 2468.3 [P.O.:1140; I.V.:1328.3] Out: 1425 [Urine:1425] Total I/O In: 947 [I.V.:947] Out: -    PE:  Gen: NAD  Abd: soft, non distended, appropriately tender, incision clean with serous drainage on bandage, penrose drain in place.  Ext: 2+ pulses, no edema  CBC Latest Ref Rng 11/20/2015 10/30/2015 09/22/2015  WBC 3.8 - 10.6 K/uL 6.8 9.0 7.8  Hemoglobin 13.0 - 18.0 g/dL 12.4(L) 14.9 12.6(L)  Hematocrit 40.0 - 52.0 % 36.1(L) 43.3 36.2(L)  Platelets 150 - 440 K/uL 195 264 295   CMP Latest Ref Rng 11/20/2015 10/30/2015 09/22/2015  Glucose 65 - 99 mg/dL 93 962(X101(H) 528(U103(H)  BUN 6 - 20 mg/dL 10 10 14   Creatinine 0.61 - 1.24 mg/dL 1.320.91 4.400.84 1.020.95  Sodium 135 - 145 mmol/L 136 137 134(L)  Potassium 3.5 - 5.1 mmol/L 3.7 4.1 3.8  Chloride 101 - 111 mmol/L 102 102 101  CO2 22 - 32 mmol/L 28 26 26   Calcium 8.9 - 10.3 mg/dL 7.2(Z8.6(L) 9.8 3.6(U8.5(L)  Total Protein 6.5 - 8.1 g/dL - - -  Total Bilirubin 0.3 - 1.2 mg/dL - - -  Alkaline Phos 38 - 126 U/L - - -  AST 15 - 41 U/L - - -  ALT 17 - 63 U/L - - -    A/P: 50 yr old male POD#2 from ileostomy takedown. Pain: scheduled tylenol, toradol, flexeril and prn oxycodone increased to 15mg  q 3 and prn Morphine iv Will Advance to regular diet Encourage ambulation Will likely d/c tomorrow

## 2015-11-21 NOTE — Progress Notes (Signed)
Pt's BP in th 80's over 40's this shift, MD notified.

## 2015-11-22 MED ORDER — CYCLOBENZAPRINE HCL 10 MG PO TABS: 10.0000 mg | ORAL_TABLET | Freq: Three times a day (TID) | ORAL | Status: DC

## 2015-11-22 MED ORDER — OXYCODONE-ACETAMINOPHEN 7.5-325 MG PO TABS: 2.0000 | ORAL_TABLET | ORAL | Status: DC | PRN

## 2015-11-22 NOTE — Care Management (Signed)
Patient status post ileostomy takedown.  Patient states that after discharge in March he had home health services for 3-4 weeks.  Patient is unsure of the agency name. Patient obtains medications from [facility].  Denies any financial concerns obtaining his medication.  No Home health needs indicated this admission.

## 2015-11-22 NOTE — Discharge Summary (Signed)
Physician Discharge Summary  Patient ID: Peter [name] MRN: [id number] DOB/AGE: 12/22/1965 50 y.o.  Admit date: 11/19/2015 Discharge date: 11/22/2015  Admission Diagnoses:  Ileostomy  Discharge Diagnoses:  Active Problems:   H/O ileostomy (HCC)   Ileostomy in place [facility])   Discharged Condition: good  Hospital Course: 50 yr old male with ileostomy after Colovesical fistula takedown in March, came in for ileostomy take down.  Patient did well with operation.  He was able to advance diet to regular.  He was up and walking around.  He is having bowel movements.  His pain is well controlled.  He has been taught how to apply the dry dressing to abdomen as well.   Consults: None  Significant Diagnostic Studies: none  Treatments: surgery: ileostomy   Discharge Exam: Blood pressure 111/73, pulse 109, temperature 98.9 F (37.2 C), temperature source Oral, resp. rate 16, height  (1.778 m), weight 190 lb (86.183 kg), SpO2 96 %. General appearance: alert, cooperative and no distress GI: soft, appropriately tender, incision clean with serous drainage, no erythema, dry dressing in place Extremities: extremities normal, atraumatic, no cyanosis or edema  Disposition: 01-Home or Self Care  Discharge Instructions    Call MD for:  difficulty breathing, headache or visual disturbances    Complete by:  As directed      Call MD for:  persistant nausea and vomiting    Complete by:  As directed      Call MD for:  redness, tenderness, or signs of infection (pain, swelling, redness, odor or green/yellow discharge around incision site)    Complete by:  As directed      Call MD for:  severe uncontrolled pain    Complete by:  As directed      Call MD for:  temperature >100.4    Complete by:  As directed      Change dressing (specify)    Complete by:  As directed   Dressing change: 2 times per day using dry dressing     Diet general    Complete by:  As directed      Driving Restrictions     Complete by:  As directed   No driving while on prescription pain medication     Increase activity slowly    Complete by:  As directed      Lifting restrictions    Complete by:  As directed   No lifting over 15lbs for 4 weeks     May shower / Bathe    Complete by:  As directed      Walk with assistance    Complete by:  As directed             Medication List    TAKE these medications        cyclobenzaprine 10 MG tablet  Commonly known as:  FLEXERIL  Take 1 tablet (10 mg total) by mouth 3 (three) times daily.     ibuprofen 600 MG tablet  Commonly known as:  ADVIL,MOTRIN  Take 400 mg by mouth every 8 (eight) hours as needed.     oxyCODONE-acetaminophen 7.5-325 MG tablet  Commonly known as:  PERCOCET  Take 2 tablets by mouth every 4 (four) hours as needed for severe pain.     pantoprazole 40 MG tablet  Commonly known as:  PROTONIX  Take 1 tablet (40 mg total) by mouth daily.           Follow-up Information    Follow  up with Gladis Riffleatherine L Freedom Peddy, MD On 11/29/2015.   Specialty:  Surgery   Why:  f/u Dr. Orvis BrillLoflin, in 1 wk   Contact information:   [address] [place] [address] [phone]       Signed: Gladis RiffleCatherine L Georgianne Gritz 11/22/2015, 1:58 PM

## 2015-11-22 NOTE — Progress Notes (Signed)
Pt discharged to home. Concerns addressed. IV site removed. Discharge summary and prescription given to patient.

## 2015-11-29 ENCOUNTER — Encounter: Payer: Self-pay | Admitting: Surgery

## 2015-11-29 ENCOUNTER — Ambulatory Visit (INDEPENDENT_AMBULATORY_CARE_PROVIDER_SITE_OTHER): Payer: Managed Care, Other (non HMO) | Admitting: Surgery

## 2015-11-29 VITALS — BP 117/78 | HR 108 | Temp 98.7°F | Wt 194.0 lb

## 2015-11-29 DIAGNOSIS — N321 Vesicointestinal fistula: Secondary | ICD-10-CM

## 2015-11-29 DIAGNOSIS — K5732 Diverticulitis of large intestine without perforation or abscess without bleeding: Secondary | ICD-10-CM

## 2015-11-29 DIAGNOSIS — Z9889 Other specified postprocedural states: Secondary | ICD-10-CM

## 2015-11-29 MED ORDER — SULFAMETHOXAZOLE-TRIMETHOPRIM 400-80 MG PO TABS: 1.0000 | ORAL_TABLET | Freq: Two times a day (BID) | ORAL | Status: DC

## 2015-11-29 MED ORDER — OXYCODONE-ACETAMINOPHEN 7.5-325 MG PO TABS: 2.0000 | ORAL_TABLET | ORAL | Status: DC | PRN

## 2015-11-29 NOTE — Progress Notes (Signed)
50 yr old male status post ileostomy takedown was initially for a colovesical fistula repair. Patient has been fatigued and having increased pain in the right side states that there is increased drainage in the area as well. Patient denies any fever chills. Patient has had some constipation but was having normal soft stools on Monday and Tuesday. Patient states he is drinking plenty of water. Patient has stopped smoking and states that his been hard.  Filed Vitals:   11/29/15 1122  BP: 117/78  Pulse: 108  Temp: 98.7 F (37.1 C)   PE:  Gen: NAD ZOX:WRUEAbd:soft, appropriately tender, erythema and purulent drainage around RLQ incision site, staples removed with some purulent material   A/P:  Penrose drain removed from incision site as well as staples and opened up more packed with wet-to-dry dressings twice a day to help patient and brother had a perform this. Will start him on Bactrim as well.  I also instructed him to drink plenty of water and to start taking stool softeners like MiraLAX to make sure he has at least one normal soft bowel movement a day with doubt need for straining. We'll have him return in one week for wound check.

## 2015-11-29 NOTE — Telephone Encounter (Signed)
Error

## 2015-11-29 NOTE — Patient Instructions (Addendum)
Please start taking Miralax 17 G daily once a day-this is bought at any pharmacy over-the counter. Or start using stool softeners daily. Please drink plenty of water.  Please start taking antibiotic and take it all.  Please pack your wound twice daily.  We will see you next week on Wednesday 12/04/2015 at 8:00 AM.

## 2015-12-04 ENCOUNTER — Ambulatory Visit (INDEPENDENT_AMBULATORY_CARE_PROVIDER_SITE_OTHER): Payer: Managed Care, Other (non HMO) | Admitting: Surgery

## 2015-12-04 ENCOUNTER — Encounter: Payer: Self-pay | Admitting: Surgery

## 2015-12-04 VITALS — BP 135/79 | HR 92 | Temp 98.2°F | Ht 70.0 in | Wt 202.8 lb

## 2015-12-04 DIAGNOSIS — K5732 Diverticulitis of large intestine without perforation or abscess without bleeding: Secondary | ICD-10-CM

## 2015-12-04 DIAGNOSIS — N321 Vesicointestinal fistula: Secondary | ICD-10-CM

## 2015-12-04 NOTE — Patient Instructions (Signed)
Continue to pack and dress wound twice daily but make guaze damp instead of wet and only apply inside wound bed. (This will become less guaze as this heals). If excess yellow material remains in wound bed, increase dressing changes to three times daily.  We will see you in follow-up as scheduled below.  To help soften stools, you will need to start a Colace (Docusate Sodium) 1-2 times daily until stools are no longer difficult.  As far as your constipation goes, you should increase water intake and activity level as much as possible to help with bowel movement. Also you may use Miralax over the counter x 2 doses, 6 hours apart, followed by Dulcolax x 2 doses, 6 hours apart until they have a successful bowel movement. If they are unsuccessful with oral medications, they will need to do 2 fleets enemas back to back. Please call back for further instructions if after taking all doses of these medications, they are still unsuccessful with a bowel movement.  Please see return to work note provided today.

## 2015-12-04 NOTE — Progress Notes (Signed)
[age]yr old male s/p ileostomy takedown on 4/16, seen last week with opening of ileostomy site. Patient has been packing the wound twice daily, states doing better.  He has been taking the Bactrim.  He has been drinking plenty of water and taking one dulcolax daily, but states still having some hard stools.  Discussed increasing to twice daily, increasing water intake, starting prunes and trying miralax.   Filed Vitals:   12/04/15 0804  BP: 135/79  Pulse: 92  Temp: 98.2 F (36.8 C)   PE:  Gen: NAD Abd: soft, non-tender, midline wound healed well, RLQ incision cellutlitis and erythema resolved, some fibrinous material in wound but no purulence or necrosis  A/P:  Continue packing wound twice daily, given constipation instructions, may return to work on June 12th with lifting restrictions, work note given.  Will have him RTC in 3 weeks for wound check.

## 2016-01-01 ENCOUNTER — Ambulatory Visit (INDEPENDENT_AMBULATORY_CARE_PROVIDER_SITE_OTHER): Payer: Managed Care, Other (non HMO) | Admitting: Surgery

## 2016-01-01 ENCOUNTER — Encounter: Payer: Self-pay | Admitting: Surgery

## 2016-01-01 VITALS — BP 138/87 | HR 82 | Temp 98.2°F | Ht 70.0 in | Wt 207.4 lb

## 2016-01-01 DIAGNOSIS — K5732 Diverticulitis of large intestine without perforation or abscess without bleeding: Secondary | ICD-10-CM

## 2016-01-01 NOTE — Patient Instructions (Signed)
Please call our office if you have any questions or concerns.  

## 2016-01-01 NOTE — Progress Notes (Signed)
50 yr old male 6 weeks out from ileostomy takedown after having colovesical fistula repair.  Patient doing well today.  States hes been back at work and doing well. He is keeping up with water intake and states that his stools are soft and normal.  He states that he takes breaks as needed and he takes miralax if he feels constipated.    Filed Vitals:   01/01/16 0910  BP: 138/87  Pulse: 82  Temp: 98.2 F (36.8 C)   PE:  Gen; NAD Abd: soft, nontender, incisions c/d/i well healed to skin level Ext: 2+ pulses, no edema  A/P: patient doing well from ileostomy takedown, instructed that he can go back to working without restrictions now, but to keep up with good water and fiber intake, that diverticulitis could always return if he lets himself get constipated again.  He expressed understanding.  He should have repeat colonoscopy in 5 yr.  Otherwise can f/u as needed.

## 2018-04-26 ENCOUNTER — Ambulatory Visit: Payer: Managed Care, Other (non HMO) | Admitting: Family Medicine

## 2018-04-26 ENCOUNTER — Ambulatory Visit
Admission: RE | Admit: 2018-04-26 | Discharge: 2018-04-26 | Disposition: A | Discharge status: Home / Self Care | Payer: Managed Care, Other (non HMO) | Source: Ambulatory Visit | Attending: Family Medicine | Admitting: Family Medicine

## 2018-04-26 ENCOUNTER — Encounter: Payer: Self-pay | Admitting: Family Medicine

## 2018-04-26 VITALS — BP 118/76 | HR 88 | Temp 98.0°F | Ht 67.0 in | Wt 215.4 lb

## 2018-04-26 DIAGNOSIS — J449 Chronic obstructive pulmonary disease, unspecified: Secondary | ICD-10-CM | POA: Diagnosis not present

## 2018-04-26 DIAGNOSIS — F17200 Nicotine dependence, unspecified, uncomplicated: Secondary | ICD-10-CM

## 2018-04-26 DIAGNOSIS — M19041 Primary osteoarthritis, right hand: Secondary | ICD-10-CM | POA: Insufficient documentation

## 2018-04-26 DIAGNOSIS — M25561 Pain in right knee: Secondary | ICD-10-CM

## 2018-04-26 DIAGNOSIS — M19042 Primary osteoarthritis, left hand: Secondary | ICD-10-CM

## 2018-04-26 DIAGNOSIS — E669 Obesity, unspecified: Secondary | ICD-10-CM | POA: Diagnosis not present

## 2018-04-26 DIAGNOSIS — M2550 Pain in unspecified joint: Secondary | ICD-10-CM

## 2018-04-26 DIAGNOSIS — Z0000 Encounter for general adult medical examination without abnormal findings: Secondary | ICD-10-CM

## 2018-04-26 DIAGNOSIS — Z23 Encounter for immunization: Secondary | ICD-10-CM | POA: Diagnosis not present

## 2018-04-26 DIAGNOSIS — M5137 Other intervertebral disc degeneration, lumbosacral region: Secondary | ICD-10-CM | POA: Insufficient documentation

## 2018-04-26 DIAGNOSIS — Z114 Encounter for screening for human immunodeficiency virus [HIV]: Secondary | ICD-10-CM

## 2018-04-26 DIAGNOSIS — M5136 Other intervertebral disc degeneration, lumbar region: Secondary | ICD-10-CM | POA: Insufficient documentation

## 2018-04-26 DIAGNOSIS — M51369 Other intervertebral disc degeneration, lumbar region without mention of lumbar back pain or lower extremity pain: Secondary | ICD-10-CM

## 2018-04-26 DIAGNOSIS — L853 Xerosis cutis: Secondary | ICD-10-CM

## 2018-04-26 DIAGNOSIS — G8929 Other chronic pain: Secondary | ICD-10-CM

## 2018-04-26 DIAGNOSIS — E66811 Obesity, class 1: Secondary | ICD-10-CM

## 2018-04-26 DIAGNOSIS — M25562 Pain in left knee: Secondary | ICD-10-CM

## 2018-04-26 MED ORDER — FLUTICASONE-SALMETEROL 250-50 MCG/DOSE IN AEPB: 1.0000 | INHALATION_SPRAY | Freq: Two times a day (BID) | RESPIRATORY_TRACT | 11 refills | Status: DC

## 2018-04-26 NOTE — Assessment & Plan Note (Signed)
Encouraged smoking cessation 

## 2018-04-26 NOTE — Progress Notes (Signed)
BP 118/76   Pulse 88   Temp 98 F (36.7 C) (Oral)   Ht 5\' 7"  (1.702 m)   Wt 215 lb 6.4 oz (97.7 kg)   SpO2 95%   BMI 33.74 kg/m    Subjective:    Patient ID: Peter [name], male    DOB: [birth year], 52 y.o.   MRN: [id number]  HPI: Peter [name] is a 52 y.o. male  Chief Complaint  Patient presents with  . New Patient (Initial Visit)    HPI Patient is here to establish care  He has a history of arthritis; did manual labor, tire work; degenerative discs in the back; now having swlling and discomfort of the 2nd MCP of the RIGHT hand (left-handed, but does a lot with the right hand); tried tylenol, ibuprofen, BCs; ate a whole in his colon two years ago; had a blood transfusion; both hands have knots Across MCPs on the right Knees are affected, just soreness in both knees; no redness or swelling Used to see the NP over at [facility] DDD of lumbar spine; did the PT and that made him hurt worse  COPD; uses breathing disc; uses Advair 250/50 and needs refills Smoking 1 ppd now; started in middle school; coughing some; father smoked PPSV-23 received in 2017 He does not want flu shots  Depression screen [facility] 2/9 04/26/2018  Decreased Interest 0  Down, Depressed, Hopeless 1  PHQ - 2 Score 1  Altered sleeping 0  Tired, decreased energy 1  Change in appetite 0  Feeling bad or failure about yourself  0  Trouble concentrating 0  Moving slowly or fidgety/restless 0  Suicidal thoughts 0  PHQ-9 Score 2   Fall Risk  04/26/2018  Falls in the past year? No    Relevant past medical, surgical, family and social history reviewed Past Medical History:  Diagnosis Date  . Arthritis    neck, shoulders  . COPD (chronic obstructive pulmonary disease) (HCC)   . Diverticulitis   . Generalized headaches   . GERD (gastroesophageal reflux disease)   . Lower back pain   . Rectal pain   . Thrombosed hemorrhoids    Past Surgical History:  Procedure Laterality Date  . APPENDECTOMY  1988    . BLADDER REPAIR N/A 09/11/2015   Procedure: BLADDER REPAIR;  Surgeon: Hildred Laser, MD;  Location: [facility] ORS;  Service: Urology;  Laterality: N/A;  . CHOLECYSTECTOMY  1989  . COLON SURGERY    . COLONOSCOPY WITH PROPOFOL N/A 08/16/2015   Procedure: COLONOSCOPY WITH PROPOFOL;  Surgeon: Midge Minium, MD;  Location: [facility];  Service: Endoscopy;  Laterality: N/A;  . CYSTOSCOPY WITH STENT PLACEMENT Bilateral 09/11/2015   Procedure: CYSTOSCOPY WITH STENT PLACEMENT;  Surgeon: Hildred Laser, MD;  Location: [facility] ORS;  Service: Urology;  Laterality: Bilateral;  . ILEOSTOMY CLOSURE N/A 11/19/2015   Procedure: ILEOSTOMY TAKEDOWN;  Surgeon: Gladis Riffle, MD;  Location: [facility] ORS;  Service: General;  Laterality: N/A;  . LAPAROSCOPIC PARTIAL COLECTOMY N/A 09/11/2015   Procedure: LAPAROSCOPIC PARTIAL COLECTOMY Possible Open, possible ostomy, repair of colovesical fistula Cystoscopy and stent placement, repair of colovesical fistula with Dr. Sherryl Barters as well ;  Surgeon: Gladis Riffle, MD;  Location: [facility] ORS;  Service: General;  Laterality: N/A;  . POLYPECTOMY  08/16/2015   Procedure: POLYPECTOMY;  Surgeon: Midge Minium, MD;  Location: [facility];  Service: Endoscopy;;  . TONSILLECTOMY  1970   Family History  Problem Relation Age of Onset  .  Cholecystitis Mother   . Lung cancer Father   . Emphysema Paternal Grandmother   . Diabetes Paternal Grandmother   . Prostate cancer Maternal Uncle   . Diabetes Brother   . Hypertension Brother    Social History   Tobacco Use  . Smoking status: Current Every Day Smoker    Packs/day: 1.00    Years: 30.00    Pack years: 30.00    Types: Cigarettes    Last attempt to quit: 11/19/2015    Years since quitting: 2.4  . Smokeless tobacco: Never Used  Substance Use Topics  . Alcohol use: No    Alcohol/week: 0.0 standard drinks  . Drug use: No     Office Visit from 04/26/2018 in [facility]  AUDIT-C Score  0       Interim medical history since last visit reviewed. Allergies and medications reviewed  Review of Systems Per HPI unless specifically indicated above     Objective:    BP 118/76   Pulse 88   Temp 98 F (36.7 C) (Oral)   Ht 5\' 7"  (1.702 m)   Wt 215 lb 6.4 oz (97.7 kg)   SpO2 95%   BMI 33.74 kg/m   Wt Readings from Last 3 Encounters:  04/26/18 215 lb 6.4 oz (97.7 kg)  01/01/16 207 lb 6.4 oz (94.1 kg)  12/04/15 202 lb 12.8 oz (92 kg)    Physical Exam  Constitutional: He appears well-developed and well-nourished. No distress.  obese  HENT:  Head: Normocephalic and atraumatic.  Eyes: EOM are normal. No scleral icterus.  Neck: No thyromegaly present.  Cardiovascular: Normal rate and regular rhythm.  Pulmonary/Chest: Effort normal and breath sounds normal.  Abdominal: Soft. Bowel sounds are normal. He exhibits no distension.  Truncal obesity  Musculoskeletal: He exhibits no edema.       Right hand: He exhibits decreased range of motion, tenderness, bony tenderness and deformity.       Hands: Swelling of the RIGHT MCPs, 2nd is the most swollen, with 3rd and 4th being mildly swollen; no ulnar deviation of the fingers; patient is unable to fully flex the right index finger  Neurological: Coordination normal.  Skin: Skin is warm and dry. No pallor.  Markedly thickened fingernails; dry skin on the hands; no fissures; nails too thick to appreciate any true pitting  Psychiatric: He has a normal mood and affect. His behavior is normal. Judgment and thought content normal.    Results for orders placed or performed during the hospital encounter of 11/19/15  CBC  Result Value Ref Range   WBC 6.8 3.8 - 10.6 K/uL   RBC 4.07 (L) 4.40 - 5.90 MIL/uL   Hemoglobin 12.4 (L) 13.0 - 18.0 g/dL   HCT 40.9 (L) 81.1 - 91.4 %   MCV 88.7 80.0 - 100.0 fL   MCH 30.6 26.0 - 34.0 pg   MCHC 34.5 32.0 - 36.0 g/dL   RDW 78.2 (H) 95.6 - 21.3 %   Platelets 195 150 - 440 K/uL  Basic metabolic panel   Result Value Ref Range   Sodium 136 135 - 145 mmol/L   Potassium 3.7 3.5 - 5.1 mmol/L   Chloride 102 101 - 111 mmol/L   CO2 28 22 - 32 mmol/L   Glucose, Bld 93 65 - 99 mg/dL   BUN 10 6 - 20 mg/dL   Creatinine, Ser 0.86 0.61 - 1.24 mg/dL   Calcium 8.6 (L) 8.9 - 10.3 mg/dL   GFR calc  non Af Amer >60 >60 mL/min   GFR calc Af Amer >60 >60 mL/min   Anion gap 6 5 - 15      Assessment & Plan:   Problem List Items Addressed This Visit      Respiratory   COPD (chronic obstructive pulmonary disease) (HCC)    Encouraged smoking cessation; refill of advair given; pneumonia vaccine in 2017; UTD; he declines flu shot      Relevant Medications   Fluticasone-Salmeterol (ADVAIR DISKUS) 250-50 MCG/DOSE AEPB     Musculoskeletal and Integument   Degenerative disc disease, lumbar    Recommended weight loss; politely declined PT      Relevant Orders   Ambulatory referral to Rheumatology   Arthritis of right hand    Get xrays today and labs and refer to arhtritis specialist      Relevant Orders   Ambulatory referral to Rheumatology   DG Hand Complete Right   VITAMIN D 25 Hydroxy (Vit-D Deficiency, Fractures)   ANA,IFA RA Diag Pnl w/rflx Tit/Patn   Arthritis of left hand    Refer to rheum, labs and xrays today      Relevant Orders   Ambulatory referral to Rheumatology   DG Hand Complete Left   VITAMIN D 25 Hydroxy (Vit-D Deficiency, Fractures)   ANA,IFA RA Diag Pnl w/rflx Tit/Patn     Other   Tobacco use disorder - Primary    Encouraged smoking cessation      Obesity (BMI 30.0-34.9)    Encouraged weight loss      Knee pain, bilateral    Will refer to rheumatologist; hold on the xrays since they are not as severe      Relevant Orders   Ambulatory referral to Rheumatology   VITAMIN D 25 Hydroxy (Vit-D Deficiency, Fractures)   ANA,IFA RA Diag Pnl w/rflx Tit/Patn   Dry skin    Check vit D; may have component of psoriasis and psoriatic arthritis      Relevant Orders    VITAMIN D 25 Hydroxy (Vit-D Deficiency, Fractures)    Other Visit Diagnoses    Need for Tdap vaccination       Relevant Orders   Tdap vaccine greater than or equal to 7yo IM (Completed)   Screening for HIV (human immunodeficiency virus)       Relevant Orders   HIV antibody (with reflex)   Preventative health care       for LABS only; patient to return for acute physical in a few weeks   Relevant Orders   CBC with Differential/Platelet   COMPLETE METABOLIC PANEL WITH GFR   Lipid panel   Arthralgia of multiple joints           Follow up plan: Return in about 4 weeks (around 05/24/2018) for complete physical.  An after-visit summary was printed and given to the patient at check-out.  Please see the patient instructions which may contain other information and recommendations beyond what is mentioned above in the assessment and plan.  Meds ordered this encounter  Medications  . Fluticasone-Salmeterol (ADVAIR DISKUS) 250-50 MCG/DOSE AEPB    Sig: Inhale 1 puff into the lungs 2 (two) times daily.    Dispense:  1 each    Refill:  11    Orders Placed This Encounter  Procedures  . DG Hand Complete Left  . DG Hand Complete Right  . Tdap vaccine greater than or equal to 7yo IM  . HIV antibody (with reflex)  . CBC with Differential/Platelet  .  COMPLETE METABOLIC PANEL WITH GFR  . Lipid panel  . VITAMIN D 25 Hydroxy (Vit-D Deficiency, Fractures)  . ANA,IFA RA Diag Pnl w/rflx Tit/Patn  . Ambulatory referral to Rheumatology

## 2018-04-26 NOTE — Assessment & Plan Note (Signed)
Encouraged smoking cessation; refill of advair given; pneumonia vaccine in 2017; UTD; he declines flu shot

## 2018-04-26 NOTE — Assessment & Plan Note (Signed)
Get xrays today and labs and refer to arhtritis specialist

## 2018-04-26 NOTE — Assessment & Plan Note (Signed)
Refer to rheum, labs and xrays today

## 2018-04-26 NOTE — Assessment & Plan Note (Signed)
Check vit D; may have component of psoriasis and psoriatic arthritis

## 2018-04-26 NOTE — Assessment & Plan Note (Signed)
Encouraged weight loss 

## 2018-04-26 NOTE — Patient Instructions (Addendum)
I do recommend yearly flu shots; for individuals who don't want flu shots, try to practice excellent hand hygiene, and avoid nursing homes, day cares, and hospitals during peak flu season; taking additional vitamin C daily during flu/cold season may help boost your immune system too  I do encourage you to quit smoking Call [phone] to sign up for smoking cessation classes You can call 1-800-QUIT-NOW to talk with a smoking cessation coach  Check out the information at familydoctor.org entitled "Nutrition for Weight Loss: What You Need to Know about Fad Diets" Try to lose between 1-2 pounds per week by taking in fewer calories and burning off more calories You can succeed by limiting portions, limiting foods dense in calories and fat, becoming more active, and drinking 8 glasses of water a day (64 ounces) Don't skip meals, especially breakfast, as skipping meals may alter your metabolism Do not use over-the-counter weight loss pills or gimmicks that claim rapid weight loss A healthy BMI (or body mass index) is between 18.5 and 24.9 You can calculate your ideal BMI at the NIH website [employer]  Please go across the street for your xrays We'll have the arthritis special see you We'll get labs today If you have not heard anything from my staff in a week about any orders/referrals/studies from today, please contact us here to follow-up [phone]

## 2018-04-26 NOTE — Assessment & Plan Note (Signed)
Recommended weight loss; politely declined PT

## 2018-04-26 NOTE — Assessment & Plan Note (Signed)
Will refer to rheumatologist; hold on the xrays since they are not as severe

## 2018-04-27 LAB — CBC WITH DIFFERENTIAL/PLATELET
Basophils Absolute: 50 cells/uL (ref 0–200)
Basophils Relative: 0.6 %
EOS ABS: 149 {cells}/uL (ref 15–500)
EOS PCT: 1.8 %
HCT: 45 % (ref 38.5–50.0)
Hemoglobin: 15.6 g/dL (ref 13.2–17.1)
Lymphs Abs: 1934 cells/uL (ref 850–3900)
MCH: 31.6 pg (ref 27.0–33.0)
MCHC: 34.7 g/dL (ref 32.0–36.0)
MCV: 91.1 fL (ref 80.0–100.0)
MONOS PCT: 6 %
MPV: 10.9 fL (ref 7.5–12.5)
Neutro Abs: 5669 cells/uL (ref 1500–7800)
Neutrophils Relative %: 68.3 %
PLATELETS: 220 10*3/uL (ref 140–400)
RBC: 4.94 10*6/uL (ref 4.20–5.80)
RDW: 12.7 % (ref 11.0–15.0)
TOTAL LYMPHOCYTE: 23.3 %
WBC mixed population: 498 cells/uL (ref 200–950)
WBC: 8.3 10*3/uL (ref 3.8–10.8)

## 2018-04-27 LAB — COMPLETE METABOLIC PANEL WITH GFR
AG Ratio: 1.7 (calc) (ref 1.0–2.5)
ALKALINE PHOSPHATASE (APISO): 47 U/L (ref 40–115)
ALT: 13 U/L (ref 9–46)
AST: 18 U/L (ref 10–35)
Albumin: 4.6 g/dL (ref 3.6–5.1)
BILIRUBIN TOTAL: 0.4 mg/dL (ref 0.2–1.2)
BUN: 13 mg/dL (ref 7–25)
CO2: 27 mmol/L (ref 20–32)
Calcium: 9.3 mg/dL (ref 8.6–10.3)
Chloride: 104 mmol/L (ref 98–110)
Creat: 0.79 mg/dL (ref 0.70–1.33)
GFR, EST AFRICAN AMERICAN: 120 mL/min/{1.73_m2} (ref >=60)
GFR, EST NON AFRICAN AMERICAN: 103 mL/min/{1.73_m2} (ref >=60)
Globulin: 2.7 g/dL (calc) (ref 1.9–3.7)
Glucose, Bld: 83 mg/dL (ref 65–139)
POTASSIUM: 4.4 mmol/L (ref 3.5–5.3)
SODIUM: 139 mmol/L (ref 135–146)
TOTAL PROTEIN: 7.3 g/dL (ref 6.1–8.1)

## 2018-04-27 LAB — LIPID PANEL
CHOL/HDL RATIO: 4.4 (calc) (ref <5.0)
Cholesterol: 159 mg/dL (ref <200)
HDL: 36 mg/dL — ABNORMAL LOW (ref >40)
LDL Cholesterol (Calc): 100 mg/dL (calc) — ABNORMAL HIGH
NON-HDL CHOLESTEROL (CALC): 123 mg/dL (ref <130)
Triglycerides: 126 mg/dL (ref <150)

## 2018-04-27 LAB — ANA,IFA RA DIAG PNL W/RFLX TIT/PATN: ANA: NEGATIVE

## 2018-04-27 LAB — HIV ANTIBODY (ROUTINE TESTING W REFLEX): HIV 1&2 Ab, 4th Generation: NONREACTIVE

## 2018-04-28 ENCOUNTER — Other Ambulatory Visit: Payer: Self-pay | Admitting: Family Medicine

## 2018-04-28 MED ORDER — ATORVASTATIN CALCIUM 10 MG PO TABS
10.0000 mg | ORAL_TABLET | Freq: Every day | ORAL | 1 refills | Status: DC
Start: 2018-04-28 — End: 2018-07-15

## 2018-04-28 NOTE — Progress Notes (Signed)
Start statin Staff to order lipids for 6 weeks out

## 2018-04-29 ENCOUNTER — Telehealth: Payer: Self-pay

## 2018-04-29 DIAGNOSIS — E785 Hyperlipidemia, unspecified: Secondary | ICD-10-CM

## 2018-04-29 NOTE — Telephone Encounter (Signed)
-----   Message from Kerman Passey, MD sent at 04/28/2018  5:00 PM EDT ----- Please let the patient know that he is no longer anemic. His glucose is the normal range, and his kidney and liver function tests are all normal. Let him know that his cholesterol is a range that he would benefit from cholesterol medicine. I'll suggest starting a statin and then rechecking his lipids in 6 weeks (please ORDER). His other labs were all negative / normal.  The 10-year ASCVD risk score Denman George DC Montez Hageman., et al., 2013) is: 7.7%   Values used to calculate the score:     Age: 52 years     Sex: Male     Is Non-Hispanic African American: No     Diabetic: No     Tobacco smoker: Yes     Systolic Blood Pressure: 118 mmHg     Is BP treated: No     HDL Cholesterol: 36 mg/dL     Total Cholesterol: 159 mg/dL

## 2018-05-24 ENCOUNTER — Emergency Department
Admission: EM | Admit: 2018-05-24 | Discharge: 2018-05-24 | Disposition: A | Discharge status: Home / Self Care | Payer: Managed Care, Other (non HMO) | Attending: Emergency Medicine | Admitting: Emergency Medicine

## 2018-05-24 ENCOUNTER — Encounter: Payer: Self-pay | Admitting: Emergency Medicine

## 2018-05-24 ENCOUNTER — Ambulatory Visit (INDEPENDENT_AMBULATORY_CARE_PROVIDER_SITE_OTHER): Payer: Managed Care, Other (non HMO) | Admitting: Family Medicine

## 2018-05-24 ENCOUNTER — Encounter: Payer: Self-pay | Admitting: Family Medicine

## 2018-05-24 ENCOUNTER — Other Ambulatory Visit: Payer: Self-pay

## 2018-05-24 VITALS — BP 138/78 | HR 100 | Temp 98.6°F | Ht 67.0 in | Wt 218.2 lb

## 2018-05-24 DIAGNOSIS — F17210 Nicotine dependence, cigarettes, uncomplicated: Secondary | ICD-10-CM | POA: Insufficient documentation

## 2018-05-24 DIAGNOSIS — Z79899 Other long term (current) drug therapy: Secondary | ICD-10-CM | POA: Insufficient documentation

## 2018-05-24 DIAGNOSIS — J449 Chronic obstructive pulmonary disease, unspecified: Secondary | ICD-10-CM | POA: Insufficient documentation

## 2018-05-24 DIAGNOSIS — L02416 Cutaneous abscess of left lower limb: Secondary | ICD-10-CM

## 2018-05-24 DIAGNOSIS — L0291 Cutaneous abscess, unspecified: Secondary | ICD-10-CM

## 2018-05-24 MED ORDER — PENTAFLUOROPROP-TETRAFLUOROETH EX AERO: INHALATION_SPRAY | CUTANEOUS | Status: DC | PRN

## 2018-05-24 MED ORDER — LIDOCAINE HCL (PF) 1 % IJ SOLN
5.0000 mL | Freq: Once | INTRAMUSCULAR | Status: AC
  Administered 2018-05-24: 5 mL via INTRADERMAL
  Filled 2018-05-24: qty 5

## 2018-05-24 MED ORDER — HYDROCODONE-ACETAMINOPHEN 5-325 MG PO TABS: 1.0000 | ORAL_TABLET | Freq: Four times a day (QID) | ORAL | 0 refills | Status: AC | PRN

## 2018-05-24 MED ORDER — SULFAMETHOXAZOLE-TRIMETHOPRIM 800-160 MG PO TABS: 1.0000 | ORAL_TABLET | Freq: Two times a day (BID) | ORAL | 0 refills | Status: DC

## 2018-05-24 MED ORDER — PENTAFLUOROPROP-TETRAFLUOROETH EX AERO
INHALATION_SPRAY | CUTANEOUS | Status: AC
  Filled 2018-05-24: qty 30

## 2018-05-24 NOTE — Patient Instructions (Signed)
Go from here to the ER please They can open and drain that for you I'm sending the culture

## 2018-05-24 NOTE — Discharge Instructions (Signed)
It appears that your PCP submitted a prescription for Bactrim (antibiotic) to the pharmacy earlier today. If not, please have the pharmacy call when you go pick up your pain med and I will have them add it on.   If you are not improving over the next 3 days and you are unable to see your PCP, return to the ER. I would prefer that your PCP looks at the abscess in 2-3 days if at all possible.

## 2018-05-24 NOTE — Progress Notes (Signed)
BP 138/78   Pulse 100   Temp 98.6 F (37 C) (Oral)   Ht 5\' 7"  (1.702 m)   Wt 218 lb 3.2 oz (99 kg)   SpO2 93%   BMI 34.17 kg/m    Subjective:    Patient ID: Peter [name], male    DOB: Apr 20, 1966, 52 y.o.   MRN: [id number]  HPI: Peter [name] is a 52 y.o. male  Chief Complaint  Patient presents with  . 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 No fevers; just warmth in the knee He cannot bend the knee very well Just infected hair; no other injury, no trauma They come up out of the blue; that's how it usually starts Both knees have been sliced open and drained [place] 2016, right thigh 2015; both wrists have been sites of other MRSA infections per his report  Depression screen [facility] 2/9 05/24/2018 04/26/2018  Decreased Interest 0 0  Down, Depressed, Hopeless 0 1  PHQ - 2 Score 0 1  Altered sleeping 0 0  Tired, decreased energy 0 1  Change in appetite 0 0  Feeling bad or failure about yourself  0 0  Trouble concentrating 0 0  Moving slowly or fidgety/restless 0 0  Suicidal thoughts 0 0  PHQ-9 Score 0 2  Difficult doing work/chores Not difficult at all -   Fall Risk  05/24/2018 04/26/2018  Falls in the past year? 0 No  Number falls in past yr: 0 -    Relevant past medical, surgical, family and social history reviewed Past Medical History:  Diagnosis Date  . Arthritis    neck, shoulders  . COPD (chronic obstructive pulmonary disease) (HCC)   . Diverticulitis   . Generalized headaches   . GERD (gastroesophageal reflux disease)   . Lower back pain   . Rectal pain   . Thrombosed hemorrhoids    Past Surgical History:  Procedure Laterality Date  . APPENDECTOMY  1988  . BLADDER REPAIR N/A 09/11/2015   Procedure: BLADDER REPAIR;  Surgeon: Hildred Laser, MD;  Location: [facility] OR[facility];  Service: Urology;  Laterality: N/A;  .  CHOLECYSTECTOMY  1989  . COLON SURGERY    . COLONOSCOPY WITH PROPOFOL N/A 08/16/2015   Procedure: COLONOSCOPY WITH PROPOFOL;  Surgeon: Midge Minium, MD;  Location: [facility];  Service: Endoscopy;  Laterality: N/A;  . CYSTOSCOPY WITH STENT PLACEMENT Bilateral 09/11/2015   Procedure: CYSTOSCOPY WITH STENT PLACEMENT;  Surgeon: Hildred Laser, MD;  Location: [facility];  Service: Urology;  Laterality: Bilateral;  . ILEOSTOMY CLOSURE N/A 11/19/2015   Procedure: ILEOSTOMY TAKEDOWN;  Surgeon: Gladis Riffle, MD;  Location: [facility];  Service: General;  Laterality: N/A;  . LAPAROSCOPIC PARTIAL COLECTOMY N/A 09/11/2015   Procedure: LAPAROSCOPIC PARTIAL COLECTOMY Possible Open, possible ostomy, repair of colovesical fistula Cystoscopy and stent placement, repair of colovesical fistula with Dr. Sherryl Barters as well ;  Surgeon: Gladis Riffle, MD;  Location: [facility];  Service: General;  Laterality: N/A;  . POLYPECTOMY  08/16/2015   Procedure: POLYPECTOMY;  Surgeon: Midge Minium, MD;  [facility];  Service: Endoscopy;;  . TONSILLECTOMY  1970   Family History  Problem Relation Age of Onset  . Cholecystitis Mother   . Lung cancer Father   . Emphysema Paternal  Grandmother   . Diabetes Paternal Grandmother   . Prostate cancer Maternal Uncle   . Diabetes Brother   . Hypertension Brother    Social History   Tobacco Use  . Smoking status: Current Every Day Smoker    Packs/day: 1.00    Years: 30.00    Pack years: 30.00    Types: Cigarettes    Last attempt to quit: 11/19/2015    Years since quitting: 2.5  . Smokeless tobacco: Never Used  Substance Use Topics  . Alcohol use: No    Alcohol/week: 0.0 standard drinks  . Drug use: No     [facility]  AUDIT-C Score  0      Interim medical history since last visit reviewed. Allergies and medications reviewed  Review of Systems Per HPI unless specifically indicated  above     Objective:    BP 138/78   Pulse 100   Temp 98.6 F (37 C) (Oral)   Ht 5\' 7"  (1.702 m)   Wt 218 lb 3.2 oz (99 kg)   SpO2 93%   BMI 34.17 kg/m   Wt Readings from Last 3 Encounters:  05/24/18 218 lb (98.9 kg)  05/24/18 218 lb 3.2 oz (99 kg)  04/26/18 215 lb 6.4 oz (97.7 kg)    Physical Exam  Constitutional: He appears well-developed and well-nourished. No distress.  Eyes: No scleral icterus.  Cardiovascular: Normal rate.  Pulmonary/Chest: Effort normal.  Neurological: He is alert.  Skin:     Large area of erythema over the LEFT knee; limited ROM of the joint; solid area with drainage (expressed by patient) and surrounding erythema further out on the periphery  Psychiatric: He has a normal mood and affect.      Assessment & Plan:   Problem List Items Addressed This Visit    None    Visit Diagnoses    Abscess of left knee    -  Primary   explained that this is going to require incision and drainage; if joint involvement, will need to be done with ortho; sending pt to ER for evaluation and treatm   Relevant Orders   Wound culture (Completed)       Follow up plan: No follow-ups on file.  An after-visit summary was printed and given to the patient at check-out.  Please see the patient instructions which may contain other information and recommendations beyond what is mentioned above in the assessment and plan.  Meds ordered this encounter  Medications  . sulfamethoxazole-trimethoprim (BACTRIM DS,SEPTRA DS) 800-160 MG tablet    Sig: Take 1 tablet by mouth 2 (two) times daily.    Dispense:  20 tablet    Refill:  0    Orders Placed This Encounter  Procedures  . Wound culture

## 2018-05-24 NOTE — ED Triage Notes (Signed)
Pt sent from PCP for evaluation of abscess just above left knee.  No fevers. Present X 4 days.  PCP sent because needs to be drained, she told him she doesn't do that. Pt has never needed surgery, gets abx and I&D.  Does not feel pain in joint. No warmth noted behind knee. Warmth only at site of abscess. Pt has had them over knee in past, no hx of infection in joint and does not feel like it is in the joint today.

## 2018-05-24 NOTE — ED Notes (Signed)
Pt  Has a red  Draining  Mass  On  l knee   No injury   Pt has  A  History of Mrsa

## 2018-05-24 NOTE — ED Provider Notes (Signed)
[facility] Emergency Department Provider Note  ____________________________________________  Time seen: Approximately 4:40 PM  I have reviewed the triage vital signs and the nursing notes.   HISTORY  Chief Complaint Abscess   HPI Peter [name] is a 52 y.o. male who presents to the emergency department for treatment and evaluation of an abscess to the left knee that is been present for the last few days.  He has a history of MRSA and skin infections for which she has had to have I&D's in the past.  He presented to his PCP today and was sent to the emergency department as that provider does not perform I&D in the office.  Patient denies nausea, vomiting, fever.  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  He denies specific injury or penetrating wound.   Past Medical History:  Diagnosis Date  . Arthritis    neck, shoulders  . COPD (chronic obstructive pulmonary disease) (HCC)   . Diverticulitis   . Generalized headaches   . GERD (gastroesophageal reflux disease)   . Lower back pain   . Rectal pain   . Thrombosed hemorrhoids     Patient Active Problem List   Diagnosis Date Noted  . Obesity (BMI 30.0-34.9) 04/26/2018  . COPD (chronic obstructive pulmonary disease) (HCC) 04/26/2018  . Arthritis of right hand 04/26/2018  . Arthritis of left hand 04/26/2018  . Degenerative disc disease, lumbar 04/26/2018  . Knee pain, bilateral 04/26/2018  . Dry skin 04/26/2018  . Tobacco use disorder 08/20/2015  . Cervical radiculopathy 06/22/2015  . Displacement of intervertebral disc at C6-C7 level 06/22/2015  . Right shoulder pain 06/22/2015    Past Surgical History:  Procedure Laterality Date  . APPENDECTOMY  1988  . BLADDER REPAIR N/A 09/11/2015   Procedure: BLADDER REPAIR;  Surgeon: Hildred Laser, MD;  Location: [facility] OR[facility];  Service: Urology;  Laterality: N/A;  . CHOLECYSTECTOMY  1989  . COLON  SURGERY    . COLONOSCOPY WITH PROPOFOL N/A 08/16/2015   Procedure: COLONOSCOPY WITH PROPOFOL;  Surgeon: Midge Minium, MD;  Location: [facility];  Service: Endoscopy;  Laterality: N/A;  . CYSTOSCOPY WITH STENT PLACEMENT Bilateral 09/11/2015   Procedure: CYSTOSCOPY WITH STENT PLACEMENT;  Surgeon: Hildred Laser, MD;  Location: [facility];  Service: Urology;  Laterality: Bilateral;  . ILEOSTOMY CLOSURE N/A 11/19/2015   Procedure: ILEOSTOMY TAKEDOWN;  Surgeon: Gladis Riffle, MD;  Location: [facility];  Service: General;  Laterality: N/A;  . LAPAROSCOPIC PARTIAL COLECTOMY N/A 09/11/2015   Procedure: LAPAROSCOPIC PARTIAL COLECTOMY Possible Open, possible ostomy, repair of colovesical fistula Cystoscopy and stent placement, repair of colovesical fistula with Dr. Sherryl Barters as well ;  Surgeon: Gladis Riffle, MD;  Location: [facility] OR[facility];  Service: General;  Laterality: N/A;  . POLYPECTOMY  08/16/2015   Procedure: POLYPECTOMY;  Surgeon: Midge Minium, MD;  Location: [facility];  Service: Endoscopy;;  . TONSILLECTOMY  1970    Prior to Admission medications   Medication Sig Start Date End Date Taking? Authorizing Provider  atorvastatin (LIPITOR) 10 MG tablet Take 1 tablet (10 mg total) by mouth at bedtime. 04/28/18   Lada, Janit Bern, MD  Fluticasone-Salmeterol (ADVAIR DISKUS) 250-50 MCG/DOSE AEPB Inhale 1 puff into the lungs 2 (two) times daily. 04/26/18   Kerman Passey, MD  HYDROcodone-acetaminophen (NORCO/VICODIN) 5-325 MG tablet Take 1 tablet by mouth every 6 (six) hours as needed for up to 3 days  for severe pain. 05/24/18 05/27/18  Malyah Ohlrich, Rulon Eisenmenger B, FNP  hyoscyamine (LEVSIN, ANASPAZ) 0.125 MG tablet Take 1 tablet by mouth daily. Reported on 01/01/2016 07/22/15   [provider]  pantoprazole (PROTONIX) 40 MG tablet Take 1 tablet (40 mg total) by mouth daily. Patient not taking: Reported on 04/26/2018 10/08/15   Gladis Riffle, MD  sulfamethoxazole-trimethoprim (BACTRIM DS,SEPTRA  DS) 800-160 MG tablet Take 1 tablet by mouth 2 (two) times daily. 05/24/18   Kerman Passey, MD    Allergies Patient has no known allergies.  Family History  Problem Relation Age of Onset  . Cholecystitis Mother   . Lung cancer Father   . Emphysema Paternal Grandmother   . Diabetes Paternal Grandmother   . Prostate cancer Maternal Uncle   . Diabetes Brother   . Hypertension Brother     Social History Social History   Tobacco Use  . Smoking status: Current Every Day Smoker    Packs/day: 1.00    Years: 30.00    Pack years: 30.00    Types: Cigarettes    Last attempt to quit: 11/19/2015    Years since quitting: 2.5  . Smokeless tobacco: Never Used  Substance Use Topics  . Alcohol use: No    Alcohol/week: 0.0 standard drinks  . Drug use: No    Review of Systems  Constitutional: Negative for fever. Respiratory: Negative for cough or shortness of breath.  Musculoskeletal: Negative for myalgias Skin: Positive for left knee abscess. Neurological: Negative for numbness or paresthesias. ____________________________________________   PHYSICAL EXAM:  VITAL SIGNS: ED Triage Vitals  Enc Vitals Group     BP 05/24/18 1620 (!) 153/96     Pulse Rate 05/24/18 1620 91     Resp 05/24/18 1620 20     Temp 05/24/18 1620 98.1 F (36.7 C)     Temp Source 05/24/18 1620 Oral     SpO2 05/24/18 1620 96 %     Weight 05/24/18 1621 218 lb (98.9 kg)     Height 05/24/18 1621 5\' 10"  (1.778 m)     Head Circumference --      Peak Flow --      Pain Score 05/24/18 1621 8     Pain Loc --      Pain Edu? --      Excl. in GC? --      Constitutional: Well appearing. Eyes: Conjunctivae are clear without discharge or drainage. Nose: No rhinorrhea noted. Mouth/Throat: Airway is patent.  Neck: No stridor. Unrestricted range of motion observed. Cardiovascular: Capillary refill is <3 seconds.  Respiratory: Respirations are even and unlabored.. Musculoskeletal: Unrestricted range of motion  observed. Neurologic: Awake, alert, and oriented x 4.  Skin: 6 cm fluctuant, erythematous area over the prepatellar surface.  ____________________________________________   LABS (all labs ordered are listed, but only abnormal results are displayed)  Labs Reviewed - No data to display ____________________________________________  EKG  Not indicated. ____________________________________________  RADIOLOGY  Not indicated ____________________________________________   PROCEDURES  .Marland KitchenIncision and Drainage Date/Time: 05/24/2018 5:24 PM Performed by: Chinita Pester, FNP Authorized by: Chinita Pester, FNP   Consent:    Consent obtained:  Verbal   Consent given by:  Patient   Risks discussed:  Bleeding, incomplete drainage and pain Location:    Type:  Abscess   Size:  5   Location:  Lower extremity   Lower extremity location:  Knee   Knee location:  L knee Pre-procedure details:    Skin preparation:  Betadine Anesthesia (see MAR for exact dosages):    Anesthesia method:  Local infiltration   Local anesthetic:  Lidocaine 1% w/o epi Procedure type:    Complexity:  Simple Procedure details:    Needle aspiration: unsuccessful.     Incision types:  Single straight   Incision depth:  Dermal   Scalpel blade:  11   Drainage:  Bloody and purulent   Drainage amount:  Moderate   Wound treatment:  Wound left open   Packing materials:  None Post-procedure details:    Patient tolerance of procedure:  Tolerated well, no immediate complications   ____________________________________________   INITIAL IMPRESSION / ASSESSMENT AND PLAN / ED COURSE  Peter [name] is a 52 y.o. male presenting to the emergency department for treatment and evaluation of abscess to the left knee that is been present for approximately 4 days.  Surrounding bursa is soft.  Initial attempt at needle aspiration was unsuccessful.  Small incision made as described above with moderate return of purulent  drainage.  No packing inserted because the incision was only deep enough express the purulent drainage.  He will be placed on antibiotics and given Norco for pain.  Work restriction will be to avoid kneeling for the next week.  He is to follow-up with the primary care provider for wound check if not improving over the next 2 to 3 days.  He is to return to the emergency department for symptoms of change or worsen if not improving with antibiotics and pain medication and is unable to see primary care.   Medications  pentafluoroprop-tetrafluoroeth (GEBAUERS) aerosol (has no administration in time range)  lidocaine (PF) (XYLOCAINE) 1 % injection 5 mL (5 mLs Intradermal Given by Other 05/24/18 1645)     Pertinent labs & imaging results that were available during my care of the patient were reviewed by me and considered in my medical decision making (see chart for details).  ____________________________________________   FINAL CLINICAL IMPRESSION(S) / ED DIAGNOSES  Final diagnoses:  Abscess    ED Discharge Orders         Ordered    HYDROcodone-acetaminophen (NORCO/VICODIN) 5-325 MG tablet  Every 6 hours PRN     05/24/18 1728           Note:  This document was prepared using Dragon voice recognition software and may include unintentional dictation errors.    Chinita Pesterriplett, Rudolfo Brandow B, FNP 05/24/18 1732    Minna AntisPaduchowski, Kevin, MD 05/24/18 2321

## 2018-05-27 LAB — WOUND CULTURE
MICRO NUMBER: 91395802
SPECIMEN QUALITY:: ADEQUATE

## 2018-05-27 NOTE — Progress Notes (Signed)
Please let patient know that his wound did in fact grow out MRSA. I'm glad he was treated in the ER. Please verify that he is taking TMP/SMX DS. Encourage him to take a probiotic or eat a yogurt daily to replace some of the healthier germs in the gut that may be diminished by the antibiotic. If he get foul watery diarrhea any time in the next two months, he needs to seek immediate medical attention in case he develops C diff (which can be dangerous). We hope his abscess will heal quickly. Thank you

## 2018-07-15 ENCOUNTER — Other Ambulatory Visit: Payer: Self-pay | Admitting: Family Medicine

## 2018-07-15 NOTE — Telephone Encounter (Signed)
Lab Results  Component Value Date   CHOL 159 04/26/2018   HDL 36 (L) 04/26/2018   LDLCALC 100 (H) 04/26/2018   TRIG 126 04/26/2018   CHOLHDL 4.4 04/26/2018   Lab Results  Component Value Date   ALT 13 04/26/2018   ALT 21 09/09/2011   Please call patient, remind him that we wanted to recheck his cholesterol on or just after June 10, 2018 I'll send in a 10 day supply to get him through the next week, so we hope to see him ASAP He does not need to fast

## 2018-07-18 NOTE — Telephone Encounter (Signed)
Pt.notified

## 2018-09-08 ENCOUNTER — Encounter: Payer: Self-pay | Admitting: Family Medicine

## 2018-09-08 ENCOUNTER — Other Ambulatory Visit: Payer: Self-pay

## 2018-09-08 ENCOUNTER — Ambulatory Visit (INDEPENDENT_AMBULATORY_CARE_PROVIDER_SITE_OTHER): Payer: Managed Care, Other (non HMO) | Admitting: Family Medicine

## 2018-09-08 VITALS — BP 110/74 | HR 68 | Temp 97.9°F | Resp 16 | Ht 67.0 in | Wt 219.2 lb

## 2018-09-08 DIAGNOSIS — F17200 Nicotine dependence, unspecified, uncomplicated: Secondary | ICD-10-CM | POA: Diagnosis not present

## 2018-09-08 DIAGNOSIS — Z8719 Personal history of other diseases of the digestive system: Secondary | ICD-10-CM | POA: Insufficient documentation

## 2018-09-08 DIAGNOSIS — R059 Cough, unspecified: Secondary | ICD-10-CM

## 2018-09-08 DIAGNOSIS — R05 Cough: Secondary | ICD-10-CM | POA: Diagnosis not present

## 2018-09-08 DIAGNOSIS — Z860100 Personal history of colon polyps, unspecified: Secondary | ICD-10-CM

## 2018-09-08 DIAGNOSIS — E669 Obesity, unspecified: Secondary | ICD-10-CM

## 2018-09-08 DIAGNOSIS — Z9889 Other specified postprocedural states: Secondary | ICD-10-CM | POA: Diagnosis not present

## 2018-09-08 DIAGNOSIS — Z0000 Encounter for general adult medical examination without abnormal findings: Secondary | ICD-10-CM | POA: Diagnosis not present

## 2018-09-08 DIAGNOSIS — K439 Ventral hernia without obstruction or gangrene: Secondary | ICD-10-CM | POA: Diagnosis not present

## 2018-09-08 HISTORY — DX: Personal history of colonic polyps: Z86.010

## 2018-09-08 HISTORY — DX: Personal history of colon polyps, unspecified: Z86.0100

## 2018-09-08 HISTORY — DX: Other specified postprocedural states: Z98.890

## 2018-09-08 NOTE — Assessment & Plan Note (Signed)
Patient is NOT ready to quit smoking; I respect his honesty; I told him I wanted to at least plant the seed, encourage him to think about, and if/when he decides to quit, I am here to help; discussed starting chest CT for lung cancer screening at age 53; CXR today

## 2018-09-08 NOTE — Assessment & Plan Note (Signed)
USPSTF grade A and B recommendations reviewed with patient; age-appropriate recommendations, preventive care, screening tests, etc discussed and encouraged; healthy living encouraged; see AVS for patient education given to patient Patient declines vaccines

## 2018-09-08 NOTE — Progress Notes (Signed)
BP 110/74 (BP Location: Left Arm, Patient Position: Sitting, Cuff Size: Normal)   Pulse 68   Temp 97.9 F (36.6 C) (Oral)   Resp 16   Ht 5\' 7"  (1.702 m)   Wt 219 lb 3.2 oz (99.4 kg)   SpO2 96%   BMI 34.33 kg/m    Subjective:    Patient ID: Peter [name], male    DOB: 03-10-1966, 53 y.o.   MRN: [id number]  HPI: Peter [name] is a 53 y.o. male  Chief Complaint  Patient presents with  . Annual Exam    HPI Here for physical Left knee is okay No travel or exposure to corona virus   USPSTF grade A and B recommendations Depression:  Depression screen [facility] 2/9 09/08/2018 05/24/2018 04/26/2018  Decreased Interest 2 0 0  Down, Depressed, Hopeless 0 0 1  PHQ - 2 Score 2 0 1  Altered sleeping 0 0 0  Tired, decreased energy 3 0 1  Change in appetite 0 0 0  Feeling bad or failure about yourself  0 0 0  Trouble concentrating 0 0 0  Moving slowly or fidgety/restless 0 0 0  Suicidal thoughts 0 0 0  PHQ-9 Score 5 0 2  Difficult doing work/chores Somewhat difficult Not difficult at all -   Hypertension: BP Readings from Last 3 Encounters:  09/08/18 110/74  05/24/18 (!) 153/96  05/24/18 138/78   Obesity: Wt Readings from Last 3 Encounters:  09/08/18 219 lb 3.2 oz (99.4 kg)  05/24/18 218 lb (98.9 kg)  05/24/18 218 lb 3.2 oz (99 kg)   BMI Readings from Last 3 Encounters:  09/08/18 34.33 kg/m  05/24/18 31.28 kg/m  05/24/18 34.17 kg/m    Immunizations: tetanus UTD; pneumonia vaccine UTD; flu shot declined  Skin cancer: no worrisome moles Lung cancer:  Smoking 1 ppd; not really ready to quit; honor that; I am here when ready; start chest CT at 55 for screening Prostate cancer: not yet 55; no sx No results found for: PSA Colorectal cancer: done in 2017; polyps, next in 2022 AAA: n/a Aspirin: daily aspirin, no bleeding, no abd pain; however, he used to take "large doses of BC powders, 6-8 of those in an 8 hour shift" and ate a hole; now just taking extra strength tylenol;  surgeon said the aspirin would eat the lining of the stomach; that was in 2016 or 2017; no bleedingnow, no abdominal pain now; I advised him then to STOP aspirin products Diet: avoiding acidic foods; room for improvement in his diet; trying to eat more fish and chicken; only so many days you can eat that 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        Non-fasting reference interval .   11/20/2015 93 65 - 99 mg/dL Final  42/39/5320 233 (H) 65 - 99 mg/dL Final   Lipids:  Lab Results  Component Value Date   CHOL 159  04/26/2018   Lab Results  Component Value Date   HDL 36 (L) 04/26/2018   Lab Results  Component Value Date   LDLCALC 100 (H) 04/26/2018   Lab Results  Component Value Date   TRIG 126 04/26/2018   Lab Results  Component Value Date   CHOLHDL 4.4 04/26/2018   No results found for: LDLDIRECT   Depression screen [facility] 2/9 09/08/2018 05/24/2018 04/26/2018  Decreased Interest 2 0 0  Down, Depressed, Hopeless 0 0 1  PHQ - 2 Score 2 0 1  Altered sleeping 0 0 0  Tired, decreased energy 3 0 1  Change in appetite 0 0 0  Feeling bad or failure about yourself  0 0 0  Trouble concentrating 0 0 0  Moving slowly or fidgety/restless 0 0 0  Suicidal thoughts 0 0 0  PHQ-9 Score 5 0 2  Difficult doing work/chores Somewhat difficult Not difficult at all -  MD note: "I'm always depressed"; discussed with patient; he says it's just life stuff; he denies SI/HI; he has supportive family, mama raised him right;  no desire to see anyone and does not want medicine when I specifically offered; he agrees that he will call me if feeling down/worse  Fall Risk  09/08/2018 05/24/2018 04/26/2018  Falls in the past year? 0 0 No  Number falls in past yr: 0 0 -  Injury with Fall? 0 - -   Relevant past medical, surgical, family and social history reviewed Past Medical History:  Diagnosis Date  . Arthritis    neck, shoulders  . COPD (chronic obstructive pulmonary disease) (HCC)   . Diverticulitis   . Generalized headaches   . GERD (gastroesophageal reflux disease)   . History of colonoscopy with polypectomy 09/08/2018   Aug 16, 2015  . Lower back pain   . Rectal pain   . Thrombosed hemorrhoids    Past Surgical History:  Procedure Laterality Date  . APPENDECTOMY  1988  . BLADDER REPAIR N/A 09/11/2015   Procedure: BLADDER REPAIR;  Surgeon: Hildred Laser, MD;  Location: [facility];  Service: Urology;  Laterality: N/A;  . CHOLECYSTECTOMY  1989  . COLON SURGERY    . COLONOSCOPY WITH PROPOFOL N/A 08/16/2015   Procedure: COLONOSCOPY WITH PROPOFOL;  Surgeon: Midge Minium, MD;  Location: [facility];  Service: Endoscopy;  Laterality: N/A;  . CYSTOSCOPY WITH STENT PLACEMENT Bilateral 09/11/2015   Procedure: CYSTOSCOPY WITH STENT PLACEMENT;  Surgeon: Hildred Laser, MD;  Location: [facility];  Service: Urology;  Laterality: Bilateral;  . ILEOSTOMY CLOSURE N/A 11/19/2015   Procedure: ILEOSTOMY TAKEDOWN;  Surgeon: Gladis Riffle, MD;  Location: [facility];  Service: General;  Laterality: N/A;  . LAPAROSCOPIC PARTIAL COLECTOMY N/A 09/11/2015   Procedure: LAPAROSCOPIC PARTIAL COLECTOMY Possible Open, possible ostomy, repair of colovesical fistula Cystoscopy and stent placement, repair of colovesical fistula with Dr. Sherryl Barters as well ;  Surgeon: Gladis Riffle, MD;  Location: [facility];  Service: General;  Laterality: N/A;  . POLYPECTOMY  08/16/2015   Procedure: POLYPECTOMY;  Surgeon: Midge Minium, MD;  Location:  [facility];  Service: Endoscopy;;  . TONSILLECTOMY  1970   Family History  Problem Relation Age of Onset  . Cholecystitis Mother   . Lung cancer Father   . Emphysema Paternal Grandmother   . Diabetes Paternal Grandmother   . Prostate cancer Maternal Uncle   . Diabetes Brother   . Hypertension Brother    Social History   Tobacco Use  .  Smoking status: Current Every Day Smoker    Packs/day: 1.00    Years: 30.00    Pack years: 30.00    Types: Cigarettes    Last attempt to quit: 11/19/2015    Years since quitting: 2.8  . Smokeless tobacco: Never Used  Substance Use Topics  . Alcohol use: No    Alcohol/week: 0.0 standard drinks  . Drug use: No     Office Visit from 09/08/2018 in [facility]  AUDIT-C Score  0      Interim medical history since last visit reviewed. Allergies and medications reviewed  Review of Systems  Constitutional: Negative for fever.  HENT: Negative for nosebleeds.   Eyes: Negative for visual disturbance (just wears glasses).  Respiratory: Positive for cough (smokers cough, worse in the morning).   Cardiovascular: Negative for chest pain.  Gastrointestinal: Negative for abdominal pain and blood in stool.  Endocrine: Negative for polydipsia.  Genitourinary: Negative for hematuria.  Allergic/Immunologic: Negative for food allergies.  Neurological: Negative for tremors and numbness.  Hematological: Negative for adenopathy. Does not bruise/bleed easily.   Per HPI unless specifically indicated above     Objective:    BP 110/74 (BP Location: Left Arm, Patient Position: Sitting, Cuff Size: Normal)   Pulse 68   Temp 97.9 F (36.6 C) (Oral)   Resp 16   Ht  (1.702 m)   Wt 219 lb 3.2 oz (99.4 kg)   SpO2 96%   BMI 34.33 kg/m   Wt Readings from Last 3 Encounters:  09/08/18 219 lb 3.2 oz (99.4 kg)  05/24/18 218 lb (98.9 kg)  05/24/18 218 lb 3.2 oz (99 kg)    Physical Exam Constitutional:      General: He is not in  acute distress.    Appearance: He is well-developed. He is not diaphoretic.  HENT:     Head: Normocephalic and atraumatic.     Nose: Nose normal.  Eyes:     General: No scleral icterus. Neck:     Thyroid: No thyromegaly.     Vascular: No JVD.  Cardiovascular:     Rate and Rhythm: Normal rate and regular rhythm.     Heart sounds: Normal heart sounds.  Pulmonary:     Effort: Pulmonary effort is normal. No respiratory distress.     Breath sounds: Normal breath sounds. No wheezing or rales.  Abdominal:     General: Bowel sounds are normal. There is no distension.     Palpations: Abdomen is soft.     Tenderness: There is no abdominal tenderness. There is no guarding.     Hernia: A hernia is present. Hernia is present in the ventral area.    Musculoskeletal: Normal range of motion.  Lymphadenopathy:     Cervical: No cervical adenopathy.  Skin:    General: Skin is warm and dry.     Coloration: Skin is not pale.     Findings: No erythema or rash.  Neurological:     Mental Status: He is alert.     Motor: No abnormal muscle tone.     Coordination: Coordination normal.     Deep Tendon Reflexes: Reflexes normal.  Psychiatric:        Behavior: Behavior normal.        Thought Content: Thought content normal.        Judgment: Judgment normal.        Assessment & Plan:   Problem List Items Addressed This Visit  Other   Ventral hernia without obstruction or gangrene    At site of ileostomy takedown; he is aware of reasons to go immediately to the ER; says his surgeon told him too; I offered to refer him back to Dr. Ludwig Lean and he declined, says he is good      Tobacco use disorder    Patient is NOT ready to quit smoking; I respect his honesty; I told him I wanted to at least plant the seed, encourage him to think about, and if/when he decides to quit, I am here to help; discussed starting chest CT for lung cancer screening at age 34; CXR today      Relevant Orders   DG  Chest 2 View   Preventative health care - Primary    USPSTF grade A and B recommendations reviewed with patient; age-appropriate recommendations, preventive care, screening tests, etc discussed and encouraged; healthy living encouraged; see AVS for patient education given to patient Patient declines vaccines      Relevant Orders   Lipid panel   CBC (Completed)   Obesity (BMI 30.0-34.9)    Encouraged weight loss      Hx of lower gastrointestinal bleeding    Patient reports hx of GI bleed with 6-8 Goody[employer] powders a day; required surgery; we talked about risk/benefit of daily aspirin therapy but he says surgeon advised him no aspirin, so will stop this altogether; tylenol is okay per package directions      History of colonoscopy with polypectomy    Chart shows due for next colonoscopy in Feb 2022; patient denies blood in stool       Other Visit Diagnoses    Cough       may be "smoker's cough" but I would like a chest xray; discussed yearly chest CT starting at age 35 because of increased risk of lung cancer in smokers   Relevant Orders   DG Chest 2 View       Follow up plan: Return in about 1 year (around 09/08/2019) for complete physical.  An after-visit summary was printed and given to the patient at check-out.  Please see the patient instructions which may contain other information and recommendations beyond what is mentioned above in the assessment and plan.  No orders of the defined types were placed in this encounter.   Orders Placed This Encounter  Procedures  . DG Chest 2 View  . Lipid panel  . CBC

## 2018-09-08 NOTE — Assessment & Plan Note (Signed)
Patient reports hx of GI bleed with 6-8 Goody's powders a day; required surgery; we talked about risk/benefit of daily aspirin therapy but he says surgeon advised him no aspirin, so will stop this altogether; tylenol is okay per package directions

## 2018-09-08 NOTE — Assessment & Plan Note (Addendum)
At site of ileostomy takedown; he is aware of reasons to go immediately to the ER; says his surgeon told him too; I offered to refer him back to Dr. Ludwig Lean and he declined, says he is good

## 2018-09-08 NOTE — Patient Instructions (Addendum)
Stop taking aspirin products If you need something for aches or pains, try to use Tylenol (acetaminophen) instead of non-steroidals (which include Aleve, ibuprofen, Advil, Motrin, and naproxen); non-steroidals can cause long-term kidney damage and gastrointestinal bleeding and high blood pressure At age 53, we'll start checking for prostate cancer and getting a yearly chest CT to screen for lung cancer I do encourage you to quit smoking Call [phone] to sign up for smoking cessation classes You can call 1-800-QUIT-NOW to talk with a smoking cessation coach  Check out the information at familydoctor.org entitled "Nutrition for Weight Loss: What You Need to Know about Fad Diets" Try to lose between 1-2 pounds per week by taking in fewer calories and burning off more calories You can succeed by limiting portions, limiting foods dense in calories and fat, becoming more active, and drinking 8 glasses of water a day (64 ounces) Don't skip meals, especially breakfast, as skipping meals may alter your metabolism Do not use over-the-counter weight loss pills or gimmicks that claim rapid weight loss A healthy BMI (or body mass index) is between 18.5 and 24.9 You can calculate your ideal BMI at the NIH website [employer]   Health Risks of Smoking Smoking cigarettes is very bad for your health. Tobacco smoke has over 200 known poisons in it. It contains the poisonous gases nitrogen oxide and carbon monoxide. There are over 60 chemicals in tobacco smoke that cause cancer. Smoking is difficult to quit because a chemical in tobacco, called nicotine, causes addiction or dependence. When you smoke and inhale, nicotine is absorbed rapidly into the bloodstream through your lungs. Both inhaled and non-inhaled nicotine may be addictive. What are the risks of cigarette smoke? Cigarette smokers have an increased risk of many serious medical problems,  including:  Lung cancer.  Lung disease, such as pneumonia, bronchitis, and emphysema.  Chest pain (angina) and heart attack because the heart is not getting enough oxygen.  Heart disease and peripheral blood vessel disease.  High blood pressure (hypertension).  Stroke.  Oral cancer, including cancer of the lip, mouth, or voice box.  Bladder cancer.  Pancreatic cancer.  Cervical cancer.  Pregnancy complications, including premature birth.  Stillbirths and smaller newborn babies, birth defects, and genetic damage to sperm.  Early menopause.  Lower estrogen level for women.  Infertility.  Facial wrinkles.  Blindness.  Increased risk of broken bones (fractures).  Senile dementia.  Stomach ulcers and internal bleeding.  Delayed wound healing and increased risk of complications during surgery.  Even smoking lightly shortens your life expectancy by several years. Because of secondhand smoke exposure, children of smokers have an increased risk of the following:  Sudden infant death syndrome (SIDS).  Respiratory infections.  Lung cancer.  Heart disease.  Ear infections. What are the benefits of quitting? There are many health benefits of quitting smoking. Here are some of them:  Within days of quitting smoking, your risk of having a heart attack decreases, your blood flow improves, and your lung capacity improves. Blood pressure, pulse rate, and breathing patterns start returning to normal soon after quitting.  Within months, your lungs may clear up completely.  Quitting for 10 years reduces your risk of developing lung cancer and heart disease to almost that of a nonsmoker.  People who quit may see an improvement in their overall quality of life. How do I quit smoking?     Smoking is an addiction with both physical and psychological effects, and longtime habits can be hard to change.  Your health care provider can recommend:  Programs and community  resources, which may include group support, education, or talk therapy.  Prescription medicines to help reduce cravings.  Nicotine replacement products, such as patches, gum, and nasal sprays. Use these products only as directed. Do not replace cigarette smoking with electronic cigarettes, which are commonly called e-cigarettes. The safety of e-cigarettes is not known, and some may contain harmful chemicals.  A combination of two or more of these methods. Where to find more information  American Lung Association: www.lung.org  American Cancer Society: www.cancer.org Summary  Smoking cigarettes is very bad for your health. Cigarette smokers have an increased risk of many serious medical problems, including several cancers, heart disease, and stroke.  Smoking is an addiction with both physical and psychological effects, and longtime habits can be hard to change.  By stopping right away, you can greatly reduce the risk of medical problems for you and your family.  To help you quit smoking, your health care provider can recommend programs, community resources, prescription medicines, and nicotine replacement products such as patches, gum, and nasal sprays. This information is not intended to replace advice given to you by your health care provider. Make sure you discuss any questions you have with your health care provider. Document Released: 07/30/2004 Document Revised: 09/23/2017 Document Reviewed: 06/26/2016 Elsevier Interactive Patient Education  2019 [employer].  Health Maintenance, Male A healthy lifestyle and preventive care is important for your health and wellness. Ask your health care provider about what schedule of regular examinations is right for you. What should I know about weight and diet? Eat a Healthy Diet  Eat plenty of vegetables, fruits, whole grains, low-fat dairy products, and lean protein.  Do not eat a lot of foods high in solid fats, added sugars, or  salt.  Maintain a Healthy Weight Regular exercise can help you achieve or maintain a healthy weight. You should:  Do at least 150 minutes of exercise each week. The exercise should increase your heart rate and make you sweat (moderate-intensity exercise).  Do strength-training exercises at least twice a week. Watch Your Levels of Cholesterol and Blood Lipids  Have your blood tested for lipids and cholesterol every 5 years starting at 52 years of age. If you are at high risk for heart disease, you should start having your blood tested when you are 53 years old. You may need to have your cholesterol levels checked more often if: ? Your lipid or cholesterol levels are high. ? You are older than 53 years of age. ? You are at high risk for heart disease. What should I know about cancer screening? Many types of cancers can be detected early and may often be prevented. Lung Cancer  You should be screened every year for lung cancer if: ? You are a current smoker who has smoked for at least 30 years. ? You are a former smoker who has quit within the past 15 years.  Talk to your health care provider about your screening options, when you should start screening, and how often you should be screened. Colorectal Cancer  Routine colorectal cancer screening usually begins at 53 years of age and should be repeated every 5-10 years until you are 53 years old. You may need to be screened more often if early forms of precancerous polyps or small growths are found. Your health care provider may recommend screening at an earlier age if you have risk factors for colon cancer.  Your health care  provider may recommend using home test kits to check for hidden blood in the stool.  A small camera at the end of a tube can be used to examine your colon (sigmoidoscopy or colonoscopy). This checks for the earliest forms of colorectal cancer. Prostate and Testicular Cancer  Depending on your age and overall health,  your health care provider may do certain tests to screen for prostate and testicular cancer.  Talk to your health care provider about any symptoms or concerns you have about testicular or prostate cancer. Skin Cancer  Check your skin from head to toe regularly.  Tell your health care provider about any new moles or changes in moles, especially if: ? There is a change in a mole's size, shape, or color. ? You have a mole that is larger than a pencil eraser.  Always use sunscreen. Apply sunscreen liberally and repeat throughout the day.  Protect yourself by wearing long sleeves, pants, a wide-brimmed hat, and sunglasses when outside. What should I know about heart disease, diabetes, and high blood pressure?  If you are 31-71 years of age, have your blood pressure checked every 3-5 years. If you are 69 years of age or older, have your blood pressure checked every year. You should have your blood pressure measured twice-once when you are at a hospital or clinic, and once when you are not at a hospital or clinic. Record the average of the two measurements. To check your blood pressure when you are not at a hospital or clinic, you can use: ? An automated blood pressure machine at a pharmacy. ? A home blood pressure monitor.  Talk to your health care provider about your target blood pressure.  If you are between 69-38 years old, ask your health care provider if you should take aspirin to prevent heart disease.  Have regular diabetes screenings by checking your fasting blood sugar level. ? If you are at a normal weight and have a low risk for diabetes, have this test once every three years after the age of 49. ? If you are overweight and have a high risk for diabetes, consider being tested at a younger age or more often.  A one-time screening for abdominal aortic aneurysm (AAA) by ultrasound is recommended for men aged 65-75 years who are current or former smokers. What should I know about  preventing infection? Hepatitis B If you have a higher risk for hepatitis B, you should be screened for this virus. Talk with your health care provider to find out if you are at risk for hepatitis B infection. Hepatitis C Blood testing is recommended for:  Everyone born from [birth year] through 1965.  Anyone with known risk factors for hepatitis C. Sexually Transmitted Diseases (STDs)  You should be screened each year for STDs including gonorrhea and chlamydia if: ? You are sexually active and are younger than 53 years of age. ? You are older than 53 years of age and your health care provider tells you that you are at risk for this type of infection. ? Your sexual activity has changed since you were last screened and you are at an increased risk for chlamydia or gonorrhea. Ask your health care provider if you are at risk.  Talk with your health care provider about whether you are at high risk of being infected with HIV. Your health care provider may recommend a prescription medicine to help prevent HIV infection. What else can I do?  Schedule regular health, dental, and eye exams.  Stay current with your vaccines (immunizations).  Do not use any tobacco products, such as cigarettes, chewing tobacco, and e-cigarettes. If you need help quitting, ask your health care provider.  Limit alcohol intake to no more than 2 drinks per day. One drink equals 12 ounces of beer, 5 ounces of wine, or 1 ounces of hard liquor.  Do not use street drugs.  Do not share needles.  Ask your health care provider for help if you need support or information about quitting drugs.  Tell your health care provider if you often feel depressed.  Tell your health care provider if you have ever been abused or do not feel safe at home. This information is not intended to replace advice given to you by your health care provider. Make sure you discuss any questions you have with your health care provider. Document  Released: 12/19/2007 Document Revised: 02/19/2016 Document Reviewed: 03/26/2015 Elsevier Interactive Patient Education  2019 [employer].

## 2018-09-08 NOTE — Progress Notes (Signed)
Peter [name], please let the patient know that his CBC is normal

## 2018-09-08 NOTE — Assessment & Plan Note (Signed)
Chart shows due for next colonoscopy in Feb 2022; patient denies blood in stool

## 2018-09-08 NOTE — Assessment & Plan Note (Signed)
Encouraged weight loss 

## 2018-09-09 LAB — LIPID PANEL
CHOLESTEROL: 156 mg/dL (ref <200)
HDL: 40 mg/dL (ref >=40)
LDL CHOLESTEROL (CALC): 96 mg/dL
Non-HDL Cholesterol (Calc): 116 mg/dL (calc) (ref <130)
TRIGLYCERIDES: 103 mg/dL (ref <150)
Total CHOL/HDL Ratio: 3.9 (calc) (ref <5.0)

## 2018-09-09 LAB — CBC
HCT: 45.7 % (ref 38.5–50.0)
HEMOGLOBIN: 15.7 g/dL (ref 13.2–17.1)
MCH: 31.5 pg (ref 27.0–33.0)
MCHC: 34.4 g/dL (ref 32.0–36.0)
MCV: 91.8 fL (ref 80.0–100.0)
MPV: 10.8 fL (ref 7.5–12.5)
Platelets: 215 10*3/uL (ref 140–400)
RBC: 4.98 10*6/uL (ref 4.20–5.80)
RDW: 12.8 % (ref 11.0–15.0)
WBC: 5.6 10*3/uL (ref 3.8–10.8)

## 2018-09-09 NOTE — Progress Notes (Signed)
Cala Bradford, please let the patient know lipid results and that his cholesterol has improved somewhat. Even though the numbers are all technically in the normal range, we can do a calculation to estimate his risk of having a heart attack in the next 10 years. If it's between 5 and 7.5%, we suggest considering a medicine to improve the cholesterol. If it's 7.5% or higher, then a statin is strongly urged. His risk of having a heart attack or other major atherosclerotic even in the next 10 years is 6.1% so we'll suggest a medicine. The medicine should help improve his cholesterol and decrease his risk of having a heart attack. Back to me if he agrees to the Rx. Either way, we'll encouraged smoking cessation, weight loss, and healthy eating which can all improve his cholesterol panel and help reduce the risk of a heart attack. Recheck lipids in 6 weeks if we start med. Recheck lipids in 12 weeks if he wants to try lifestyle alone (please ORDER based on his preference). Thank you The 10-year ASCVD risk score Denman George DC Montez Hageman., et al., 2013) is: 6.1%   Values used to calculate the score:     Age: 53 years     Sex: Male     Is Non-Hispanic African American: No     Diabetic: No     Tobacco smoker: Yes     Systolic Blood Pressure: 110 mmHg     Is BP treated: No     HDL Cholesterol: 40 mg/dL     Total Cholesterol: 156 mg/dL

## 2019-01-11 ENCOUNTER — Emergency Department: Payer: Worker's Compensation

## 2019-01-11 ENCOUNTER — Emergency Department
Admission: EM | Admit: 2019-01-11 | Discharge: 2019-01-12 | Disposition: A | Discharge status: Home / Self Care | Payer: Worker's Compensation | Attending: Emergency Medicine | Admitting: Emergency Medicine

## 2019-01-11 ENCOUNTER — Other Ambulatory Visit: Payer: Self-pay

## 2019-01-11 ENCOUNTER — Encounter: Payer: Self-pay | Admitting: *Deleted

## 2019-01-11 DIAGNOSIS — M4800 Spinal stenosis, site unspecified: Secondary | ICD-10-CM | POA: Insufficient documentation

## 2019-01-11 DIAGNOSIS — F17210 Nicotine dependence, cigarettes, uncomplicated: Secondary | ICD-10-CM | POA: Diagnosis not present

## 2019-01-11 DIAGNOSIS — Z9049 Acquired absence of other specified parts of digestive tract: Secondary | ICD-10-CM | POA: Diagnosis not present

## 2019-01-11 DIAGNOSIS — J449 Chronic obstructive pulmonary disease, unspecified: Secondary | ICD-10-CM | POA: Diagnosis not present

## 2019-01-11 DIAGNOSIS — Z79899 Other long term (current) drug therapy: Secondary | ICD-10-CM | POA: Insufficient documentation

## 2019-01-11 DIAGNOSIS — R202 Paresthesia of skin: Secondary | ICD-10-CM | POA: Diagnosis present

## 2019-01-11 LAB — BASIC METABOLIC PANEL
Anion gap: 9 (ref 5–15)
BUN: 15 mg/dL (ref 6–20)
CO2: 26 mmol/L (ref 22–32)
Calcium: 9 mg/dL (ref 8.9–10.3)
Chloride: 106 mmol/L (ref 98–111)
Creatinine, Ser: 1.1 mg/dL (ref 0.61–1.24)
GFR calc Af Amer: >60 mL/min (ref >60)
GFR calc non Af Amer: >60 mL/min (ref >60)
Glucose, Bld: 84 mg/dL (ref 70–99)
Potassium: 4.1 mmol/L (ref 3.5–5.1)
Sodium: 141 mmol/L (ref 135–145)

## 2019-01-11 LAB — CBC WITH DIFFERENTIAL/PLATELET
Abs Immature Granulocytes: 0.01 10*3/uL (ref 0.00–0.07)
Basophils Absolute: 0.1 10*3/uL (ref 0.0–0.1)
Basophils Relative: 1 %
Eosinophils Absolute: 0.3 10*3/uL (ref 0.0–0.5)
Eosinophils Relative: 5 %
HCT: 43.8 % (ref 39.0–52.0)
Hemoglobin: 14.7 g/dL (ref 13.0–17.0)
Immature Granulocytes: 0 %
Lymphocytes Relative: 39 %
Lymphs Abs: 2.3 10*3/uL (ref 0.7–4.0)
MCH: 31.4 pg (ref 26.0–34.0)
MCHC: 33.6 g/dL (ref 30.0–36.0)
MCV: 93.6 fL (ref 80.0–100.0)
Monocytes Absolute: 0.4 10*3/uL (ref 0.1–1.0)
Monocytes Relative: 7 %
Neutro Abs: 2.9 10*3/uL (ref 1.7–7.7)
Neutrophils Relative %: 48 %
Platelets: 185 10*3/uL (ref 150–400)
RBC: 4.68 MIL/uL (ref 4.22–5.81)
RDW: 12.8 % (ref 11.5–15.5)
WBC: 6 10*3/uL (ref 4.0–10.5)
nRBC: 0 % (ref 0.0–0.2)

## 2019-01-11 LAB — CBC
HCT: 43.1 % (ref 39.0–52.0)
Hemoglobin: 15 g/dL (ref 13.0–17.0)
MCH: 32.1 pg (ref 26.0–34.0)
MCHC: 34.8 g/dL (ref 30.0–36.0)
MCV: 92.3 fL (ref 80.0–100.0)
Platelets: 197 10*3/uL (ref 150–400)
RBC: 4.67 MIL/uL (ref 4.22–5.81)
RDW: 12.8 % (ref 11.5–15.5)
WBC: 6.5 10*3/uL (ref 4.0–10.5)
nRBC: 0 % (ref 0.0–0.2)

## 2019-01-11 LAB — TROPONIN I (HIGH SENSITIVITY): Troponin I (High Sensitivity): 5 ng/L (ref <18)

## 2019-01-11 NOTE — ED Triage Notes (Signed)
Pt to ED reporting intermittent bilateral arm numbness and weakness x 1 week with shoulder pains, chest pains and headaches. PT has been having intermittent sore throat. nO known exposure to COVID. No fevers at home.

## 2019-01-11 NOTE — ED Provider Notes (Addendum)
[facility] Emergency Department Provider Note   ____________________________________________   First MD Initiated Contact with Patient 01/11/19 2025     (approximate)  I have reviewed the triage vital signs and the nursing notes.   HISTORY  Chief Complaint Weakness and Numbness    HPI Peter [name] is a 53 y.o. male reports arm numbness and weakness for about a week.  He has numbness in his shoulders it runs down his arms.  He has some headaches chest pain there is not really a problem he tells me it is mostly in his upper chest by his neck coming out of his neck running into the arms are numb and tingly.  He had a COVID test a couple days ago but does not know the report yet.  Is not coughing or having fever or shortness of breath.  He did have a little sore throat earlier but not now.  He had had some trouble with his arms and neck previously and had some x-rays done number of years ago that showed some osteoarthritis.         Past Medical History:  Diagnosis Date   Arthritis    neck, shoulders   COPD (chronic obstructive pulmonary disease) (HCC)    Diverticulitis    Generalized headaches    GERD (gastroesophageal reflux disease)    History of colonoscopy with polypectomy 09/08/2018   Aug 16, 2015   Lower back pain    Rectal pain    Thrombosed hemorrhoids     Patient Active Problem List   Diagnosis Date Noted   Preventative health care 09/08/2018   Ventral hernia without obstruction or gangrene 09/08/2018   History of colonoscopy with polypectomy 09/08/2018   Hx of lower gastrointestinal bleeding 09/08/2018   Obesity (BMI 30.0-34.9) 04/26/2018   COPD (chronic obstructive pulmonary disease) (HCC) 04/26/2018   Arthritis of right hand 04/26/2018   Arthritis of left hand 04/26/2018   Degenerative disc disease, lumbar 04/26/2018   Knee pain, bilateral 04/26/2018   Dry skin 04/26/2018   Tobacco use disorder 08/20/2015    Cervical radiculopathy 06/22/2015   Displacement of intervertebral disc at C6-C7 level 06/22/2015   Right shoulder pain 06/22/2015    Past Surgical History:  Procedure Laterality Date   APPENDECTOMY  1988   BLADDER REPAIR N/A 09/11/2015   Procedure: BLADDER REPAIR;  Surgeon: Hildred Laser, MD;  Location: [facility];  Service: Urology;  Laterality: N/A;   CHOLECYSTECTOMY  1989   COLON SURGERY     COLONOSCOPY WITH PROPOFOL N/A 08/16/2015   Procedure: COLONOSCOPY WITH PROPOFOL;  Surgeon: Midge Minium, MD;  Location: [facility];  Service: Endoscopy;  Laterality: N/A;   CYSTOSCOPY WITH STENT PLACEMENT Bilateral 09/11/2015   Procedure: CYSTOSCOPY WITH STENT PLACEMENT;  Surgeon: Hildred Laser, MD;  Location: [facility];  Service: Urology;  Laterality: Bilateral;   ILEOSTOMY CLOSURE N/A 11/19/2015   Procedure: ILEOSTOMY TAKEDOWN;  Surgeon: Gladis Riffle, MD;  Location: [facility];  Service: General;  Laterality: N/A;   LAPAROSCOPIC PARTIAL COLECTOMY N/A 09/11/2015   Procedure: LAPAROSCOPIC PARTIAL COLECTOMY Possible Open, possible ostomy, repair of colovesical fistula Cystoscopy and stent placement, repair of colovesical fistula with Dr. Sherryl Barters as well ;  Surgeon: Gladis Riffle, MD;  Location: [facility];  Service: General;  Laterality: N/A;   POLYPECTOMY  08/16/2015   Procedure: POLYPECTOMY;  Surgeon: Midge Minium, MD;  Location: [facility];  Service: Endoscopy;;   TONSILLECTOMY  1970  Prior to Admission medications   Medication Sig Start Date End Date Taking? Authorizing Provider  atorvastatin (LIPITOR) 10 MG tablet TAKE 1 TABLET BY MOUTH EVERYDAY AT BEDTIME Patient not taking: Reported on 09/08/2018 07/15/18   Kerman PasseyLada, Melinda P, MD  Fluticasone-Salmeterol (ADVAIR DISKUS) 250-50 MCG/DOSE AEPB Inhale 1 puff into the lungs 2 (two) times daily. 04/26/18   Lada, Janit BernMelinda P, MD  hyoscyamine (LEVSIN, ANASPAZ) 0.125 MG tablet Take 1 tablet by mouth daily. Reported on  01/01/2016 07/22/15   [provider]  pantoprazole (PROTONIX) 40 MG tablet Take 1 tablet (40 mg total) by mouth daily. Patient not taking: Reported on 04/26/2018 10/08/15   Gladis RiffleLoflin, Catherine L, MD    Allergies Patient has no known allergies.  Family History  Problem Relation Age of Onset   Cholecystitis Mother    Lung cancer Father    Emphysema Paternal Grandmother    Diabetes Paternal Grandmother    Prostate cancer Maternal Uncle    Diabetes Brother    Hypertension Brother     Social History Social History   Tobacco Use   Smoking status: Current Every Day Smoker    Packs/day: 1.00    Years: 30.00    Pack years: 30.00    Types: Cigarettes    Last attempt to quit: 11/19/2015    Years since quitting: 3.1   Smokeless tobacco: Never Used  Substance Use Topics   Alcohol use: No    Alcohol/week: 0.0 standard drinks   Drug use: No    Review of Systems  Constitutional: No fever/chills Eyes: No visual changes. ENT: No sore throat. Cardiovascular: Denies chest pain. Respiratory: Denies shortness of breath. Gastrointestinal: No abdominal pain.  No nausea, no vomiting.  No diarrhea.  No constipation. Genitourinary: Negative for dysuria. Musculoskeletal: Negative for back pain. Skin: Negative for rash. Neurological: See HPI  ____________________________________________   PHYSICAL EXAM:  VITAL SIGNS: ED Triage Vitals  Enc Vitals Group     BP 01/11/19 1654 131/83     Pulse Rate 01/11/19 1654 91     Resp 01/11/19 1654 16     Temp 01/11/19 1654 98.7 F (37.1 C)     Temp Source 01/11/19 1654 Oral     SpO2 01/11/19 1654 96 %     Weight 01/11/19 1649 200 lb (90.7 kg)     Height 01/11/19 1649 5\' 9"  (1.753 m)     Head Circumference --      Peak Flow --      Pain Score 01/11/19 1648 6     Pain Loc --      Pain Edu? --      Excl. in GC? --     Constitutional: Alert and oriented. Well appearing and in no acute distress. Eyes: Conjunctivae are normal.   Head: Atraumatic. Nose: No congestion/rhinnorhea. Mouth/Throat: Mucous membranes are moist.  Oropharynx non-erythematous. Neck: No stridor.   Cardiovascular: Normal rate, regular rhythm. Grossly normal heart sounds.  Good peripheral circulation. Respiratory: Normal respiratory effort.  No retractions. Lungs CTAB. Gastrointestinal: Soft and nontender. No distention. No abdominal bruits. No CVA tenderness. Musculoskeletal: No lower extremity tenderness nor edema.   Neurologic:  Normal speech and language.  No marked weakness of his arms or legs.  Pushing and pulling against me however makes his arms numb and tingly.  The tingliness goes all the way up to his neck. Skin:  Skin is warm, dry and intact. No rash noted.   ____________________________________________   LABS (all labs ordered are listed, but  only abnormal results are displayed)  Labs Reviewed  BASIC METABOLIC PANEL  CBC  TROPONIN I (HIGH SENSITIVITY)  CBC WITH DIFFERENTIAL/PLATELET  URINALYSIS, COMPLETE (UACMP) WITH MICROSCOPIC  HEPATIC FUNCTION PANEL   ____________________________________________  EKG EKG read and interpreted by me shows normal sinus rhythm at 83 normal axis essentially normal EKG ____________________________________________  RADIOLOGY  ED MD interpretation: MRI shows spinal stenosis.  This is per radiology reading.  I reviewed the film.  Official radiology report(s): Peter Brain Wo [name]  Result Date: 01/11/2019 CLINICAL DATA:  Initial evaluation for intermittent bilateral arm numbness and weakness for 1 week, headaches. EXAM: MRI HEAD WITHOUT [name] MRI CERVICAL SPINE WITHOUT [name] TECHNIQUE: Multiplanar, multiecho pulse sequences of the brain and surrounding structures, and cervical spine, to include the craniocervical junction and cervicothoracic junction, were obtained without intravenous [name]. COMPARISON:  Prior MRI from 04/26/2015. FINDINGS: MRI HEAD FINDINGS Brain: Cerebral volume  within normal limits for patient age. Few scattered subcentimeter T2/FLAIR hyperintensities noted within the periventricular and deep white matter both cerebral hemispheres, nonspecific, but felt to be within normal limits for age. No abnormal foci of restricted diffusion to suggest acute or subacute ischemia. Gray-white matter differentiation well maintained. No encephalomalacia to suggest chronic infarction. No foci of susceptibility artifact to suggest acute or chronic intracranial hemorrhage. No mass lesion, midline shift or mass effect. No hydrocephalus. No extra-axial fluid collection. Major dural sinuses are grossly patent. Pituitary gland and suprasellar region are normal. Midline structures intact and normal. Vascular: Major intracranial vascular flow voids well maintained and normal in appearance. Skull and upper cervical spine: Craniocervical junction normal. Visualized upper cervical spine within normal limits. Bone marrow signal intensity normal. No scalp soft tissue abnormality. Sinuses/Orbits: Globes and orbital soft tissues within normal limits. Paranasal sinuses are clear. Trace left mastoid effusion noted, of doubtful significance. Inner ear structures normal. Other: None. MRI CERVICAL SPINE FINDINGS Alignment: Straightening of the normal cervical lordosis. No listhesis. Vertebrae: Vertebral body height maintained without evidence for acute or chronic fracture. Bone marrow signal intensity within normal limits. No discrete or worrisome osseous lesions. No abnormal marrow edema. Cord: Signal intensity within the cervical spinal cord is normal. Posterior Fossa, vertebral arteries, paraspinal tissues: Craniocervical junction within normal limits. Paraspinous and prevertebral soft tissues within normal limits. Normal intravascular flow voids seen within the vertebral arteries bilaterally. Disc levels: C2-C3: Mild right-sided uncovertebral hypertrophy without significant disc bulge. Advanced right-sided  facet degeneration. Resultant moderate right C3 foraminal stenosis. No significant canal or left foraminal narrowing. C3-C4: Diffuse degenerative disc osteophyte with intervertebral disc space narrowing. Broad posterior component with superimposed prominent central soft disc protrusion indents and effaces the ventral thecal sac. Secondary impingement and flattening of the cervical spinal cord without cord signal changes. Resultant severe spinal stenosis. Thecal sac measures 5 mm in AP diameter at its most narrow point. Associated severe left greater than right C4 foraminal stenosis. C4-C5: Circumferential disc osteophyte with intervertebral disc space narrowing. Broad posterior component effaces the ventral thecal sac and resultant moderate to severe spinal stenosis. Mild cord flattening without cord signal changes. Thecal sac measures 7 mm in AP diameter. Moderate left worse than right C5 foraminal stenosis. C5-C6: Circumferential disc osteophyte with intervertebral disc space narrowing. Broad posterior component slightly asymmetric to the right effaces the ventral thecal sac. Resultant severe spinal stenosis with associated cord flattening. No cord signal changes. Thecal sac measures 6 mm in AP diameter. Severe right worse than left C6 foraminal stenosis. C6-C7: Diffuse disc osteophyte with intervertebral disc space  narrowing. Right worse than left uncovertebral spurring. Broad posterior component effaces the ventral thecal sac resultant moderate to severe spinal stenosis. Mild cord flattening without cord signal changes. Thecal sac measures 6 mm in AP diameter at its most narrow point. Severe right worse than left C7 foraminal stenosis. C7-T1: Negative interspace. Mild facet hypertrophy. No significant canal or foraminal stenosis. Visualized upper thoracic spine demonstrates mild disc bulging at T1-2 and T2-3 without significant stenosis. IMPRESSION: MRI HEAD IMPRESSION: Normal brain MRI for age. No acute  intracranial abnormality identified. MRI CERVICAL SPINE IMPRESSION: 1. Advanced multilevel cervical spondylolysis at C3-4 through C6-7 with resultant moderate to severe diffuse spinal stenosis. Secondary cord flattening and impingement at multiple levels without cord signal changes. 2. Multifactorial degenerative changes with resultant multilevel foraminal narrowing as above. Notable findings include moderate right C3 foraminal stenosis, severe bilateral C4 foraminal narrowing, moderate bilateral C5 foraminal stenosis, with severe bilateral C6 and C7 foraminal stenosis. Electronically Signed   By: Jeannine Boga M.D.   On: 01/11/2019 23:59   Peter [name]  Result Date: 01/11/2019 CLINICAL DATA:  Initial evaluation for intermittent bilateral arm numbness and weakness for 1 week, headaches. EXAM: MRI HEAD WITHOUT [name] MRI CERVICAL SPINE WITHOUT [name] TECHNIQUE: Multiplanar, multiecho pulse sequences of the brain and surrounding structures, and cervical spine, to include the craniocervical junction and cervicothoracic junction, were obtained without intravenous [name]. COMPARISON:  Prior MRI from 04/26/2015. FINDINGS: MRI HEAD FINDINGS Brain: Cerebral volume within normal limits for patient age. Few scattered subcentimeter T2/FLAIR hyperintensities noted within the periventricular and deep white matter both cerebral hemispheres, nonspecific, but felt to be within normal limits for age. No abnormal foci of restricted diffusion to suggest acute or subacute ischemia. Gray-white matter differentiation well maintained. No encephalomalacia to suggest chronic infarction. No foci of susceptibility artifact to suggest acute or chronic intracranial hemorrhage. No mass lesion, midline shift or mass effect. No hydrocephalus. No extra-axial fluid collection. Major dural sinuses are grossly patent. Pituitary gland and suprasellar region are normal. Midline structures intact and normal. Vascular:  Major intracranial vascular flow voids well maintained and normal in appearance. Skull and upper cervical spine: Craniocervical junction normal. Visualized upper cervical spine within normal limits. Bone marrow signal intensity normal. No scalp soft tissue abnormality. Sinuses/Orbits: Globes and orbital soft tissues within normal limits. Paranasal sinuses are clear. Trace left mastoid effusion noted, of doubtful significance. Inner ear structures normal. Other: None. MRI CERVICAL SPINE FINDINGS Alignment: Straightening of the normal cervical lordosis. No listhesis. Vertebrae: Vertebral body height maintained without evidence for acute or chronic fracture. Bone marrow signal intensity within normal limits. No discrete or worrisome osseous lesions. No abnormal marrow edema. Cord: Signal intensity within the cervical spinal cord is normal. Posterior Fossa, vertebral arteries, paraspinal tissues: Craniocervical junction within normal limits. Paraspinous and prevertebral soft tissues within normal limits. Normal intravascular flow voids seen within the vertebral arteries bilaterally. Disc levels: C2-C3: Mild right-sided uncovertebral hypertrophy without significant disc bulge. Advanced right-sided facet degeneration. Resultant moderate right C3 foraminal stenosis. No significant canal or left foraminal narrowing. C3-C4: Diffuse degenerative disc osteophyte with intervertebral disc space narrowing. Broad posterior component with superimposed prominent central soft disc protrusion indents and effaces the ventral thecal sac. Secondary impingement and flattening of the cervical spinal cord without cord signal changes. Resultant severe spinal stenosis. Thecal sac measures 5 mm in AP diameter at its most narrow point. Associated severe left greater than right C4 foraminal stenosis. C4-C5: Circumferential disc osteophyte with intervertebral disc space narrowing.  Broad posterior component effaces the ventral thecal sac and  resultant moderate to severe spinal stenosis. Mild cord flattening without cord signal changes. Thecal sac measures 7 mm in AP diameter. Moderate left worse than right C5 foraminal stenosis. C5-C6: Circumferential disc osteophyte with intervertebral disc space narrowing. Broad posterior component slightly asymmetric to the right effaces the ventral thecal sac. Resultant severe spinal stenosis with associated cord flattening. No cord signal changes. Thecal sac measures 6 mm in AP diameter. Severe right worse than left C6 foraminal stenosis. C6-C7: Diffuse disc osteophyte with intervertebral disc space narrowing. Right worse than left uncovertebral spurring. Broad posterior component effaces the ventral thecal sac resultant moderate to severe spinal stenosis. Mild cord flattening without cord signal changes. Thecal sac measures 6 mm in AP diameter at its most narrow point. Severe right worse than left C7 foraminal stenosis. C7-T1: Negative interspace. Mild facet hypertrophy. No significant canal or foraminal stenosis. Visualized upper thoracic spine demonstrates mild disc bulging at T1-2 and T2-3 without significant stenosis. IMPRESSION: MRI HEAD IMPRESSION: Normal brain MRI for age. No acute intracranial abnormality identified. MRI CERVICAL SPINE IMPRESSION: 1. Advanced multilevel cervical spondylolysis at C3-4 through C6-7 with resultant moderate to severe diffuse spinal stenosis. Secondary cord flattening and impingement at multiple levels without cord signal changes. 2. Multifactorial degenerative changes with resultant multilevel foraminal narrowing as above. Notable findings include moderate right C3 foraminal stenosis, severe bilateral C4 foraminal narrowing, moderate bilateral C5 foraminal stenosis, with severe bilateral C6 and C7 foraminal stenosis. Electronically Signed   By: Rise MuBenjamin  McClintock M.D.   On: 01/11/2019 23:59    ____________________________________________   PROCEDURES  Procedure(s)  performed (including Critical Care):  Procedures   ____________________________________________   INITIAL IMPRESSION / ASSESSMENT AND PLAN / ED COURSE  I am uncertain what this numbness could be.  It could be some nerve impingement in the neck.  He does have some pain at the base of the neck as well.  We will get an MRI of his head and neck to see if there is anything going on there. Octaviano BattyMark A Minnifield was evaluated in Emergency Department on 01/12/2019 for the symptoms described in the history of present illness. He was evaluated in the context of the global COVID-19 pandemic, which necessitated consideration that the patient might be at risk for infection with the SARS-CoV-2 virus that causes COVID-19. Institutional protocols and algorithms that pertain to the evaluation of patients at risk for COVID-19 are in a state of rapid change based on information released by regulatory bodies including the CDC and federal and state organizations. These policies and algorithms were followed during the patient's care in the ED.    Discussed the patient with Dr. Marcell BarlowYarborough who reviewed the MRI.  He will follow the patient up in the morning.  We offered the patient admission but the patient does not want admission at this time.  Patient is able to handle his cell phone without difficulty.         ____________________________________________   FINAL CLINICAL IMPRESSION(S) / ED DIAGNOSES  Final diagnoses:  Spinal stenosis, unspecified spinal region     ED Discharge Orders    None       Note:  This document was prepared using Dragon voice recognition software and may include unintentional dictation errors.    Peter NatalMalinda, Iyanni Hepp F, MD 01/12/19 [id number]    Peter NatalMalinda, Eulene Pekar F, MD 01/30/19 [phone]

## 2019-01-12 LAB — HEPATIC FUNCTION PANEL
ALT: 12 U/L (ref 0–44)
AST: 20 U/L (ref 15–41)
Albumin: 4.1 g/dL (ref 3.5–5.0)
Alkaline Phosphatase: 42 U/L (ref 38–126)
Bilirubin, Direct: <0.1 mg/dL (ref 0.0–0.2)
Total Bilirubin: 0.4 mg/dL (ref 0.3–1.2)
Total Protein: 7 g/dL (ref 6.5–8.1)

## 2019-01-12 NOTE — Consult Note (Signed)
Imaging and clinical course reviewed.  53 yo male with subjective weakness and cervical stenosis and no objective weakness.  Will follow closely and advise on possible decompression.  Meade Maw

## 2019-01-12 NOTE — Discharge Instructions (Addendum)
Dr. Cari Caraway said he would call you in the morning to set up a follow-up appointment later on tomorrow.  If he does not call by 10 or so please call his office and let them know that you were seen in the emergency room and that he said he would see you tomorrow.  He will evaluate you to see about surgery later.

## 2019-01-16 ENCOUNTER — Other Ambulatory Visit: Payer: Self-pay | Admitting: Neurosurgery

## 2019-02-08 ENCOUNTER — Other Ambulatory Visit: Payer: Managed Care, Other (non HMO)

## 2019-02-10 ENCOUNTER — Other Ambulatory Visit: Payer: Self-pay

## 2019-02-10 ENCOUNTER — Encounter
Admission: RE | Admit: 2019-02-10 | Discharge: 2019-02-10 | Disposition: A | Discharge status: Home / Self Care | Payer: Managed Care, Other (non HMO) | Source: Ambulatory Visit | Attending: Neurosurgery | Admitting: Neurosurgery

## 2019-02-10 DIAGNOSIS — Z20828 Contact with and (suspected) exposure to other viral communicable diseases: Secondary | ICD-10-CM | POA: Diagnosis not present

## 2019-02-10 DIAGNOSIS — Z01812 Encounter for preprocedural laboratory examination: Secondary | ICD-10-CM | POA: Diagnosis present

## 2019-02-10 DIAGNOSIS — G959 Disease of spinal cord, unspecified: Secondary | ICD-10-CM | POA: Diagnosis not present

## 2019-02-10 LAB — BASIC METABOLIC PANEL
Anion gap: 7 (ref 5–15)
BUN: 23 mg/dL — ABNORMAL HIGH (ref 6–20)
CO2: 28 mmol/L (ref 22–32)
Calcium: 9 mg/dL (ref 8.9–10.3)
Chloride: 106 mmol/L (ref 98–111)
Creatinine, Ser: 0.94 mg/dL (ref 0.61–1.24)
GFR calc Af Amer: >60 mL/min (ref >60)
GFR calc non Af Amer: >60 mL/min (ref >60)
Glucose, Bld: 92 mg/dL (ref 70–99)
Potassium: 4.9 mmol/L (ref 3.5–5.1)
Sodium: 141 mmol/L (ref 135–145)

## 2019-02-10 LAB — SURGICAL PCR SCREEN
MRSA, PCR: POSITIVE — AB
Staphylococcus aureus: POSITIVE — AB

## 2019-02-10 LAB — URINALYSIS, ROUTINE W REFLEX MICROSCOPIC
Bilirubin Urine: NEGATIVE
Glucose, UA: NEGATIVE mg/dL
Hgb urine dipstick: NEGATIVE
Ketones, ur: NEGATIVE mg/dL
Leukocytes,Ua: NEGATIVE
Nitrite: NEGATIVE
Protein, ur: NEGATIVE mg/dL
Specific Gravity, Urine: 1.023 (ref 1.005–1.030)
pH: 5 (ref 5.0–8.0)

## 2019-02-10 LAB — CBC
HCT: 44.8 % (ref 39.0–52.0)
Hemoglobin: 15 g/dL (ref 13.0–17.0)
MCH: 31.6 pg (ref 26.0–34.0)
MCHC: 33.5 g/dL (ref 30.0–36.0)
MCV: 94.5 fL (ref 80.0–100.0)
Platelets: 191 10*3/uL (ref 150–400)
RBC: 4.74 MIL/uL (ref 4.22–5.81)
RDW: 12.9 % (ref 11.5–15.5)
WBC: 5.7 10*3/uL (ref 4.0–10.5)
nRBC: 0 % (ref 0.0–0.2)

## 2019-02-10 LAB — PROTIME-INR
INR: 0.9 (ref 0.8–1.2)
Prothrombin Time: 12.5 seconds (ref 11.4–15.2)

## 2019-02-10 LAB — TYPE AND SCREEN
ABO/RH(D): A POS
Antibody Screen: NEGATIVE

## 2019-02-10 LAB — APTT: aPTT: 27 seconds (ref 24–36)

## 2019-02-10 NOTE — Patient Instructions (Signed)
Your procedure is scheduled on: 02-15-19 [facility] Report to Same Day Surgery 2nd floor medical mall [facility] Entrance-take elevator on left to 2nd floor.  Check in with surgery information desk.) To find out your arrival time please call [phone] between 1PM - 3PM on 02-14-19 TUESDAY  Remember: Instructions that are not followed completely may result in serious medical risk, up to and including death, or upon the discretion of your surgeon and anesthesiologist your surgery may need to be rescheduled.    _x___ 1. Do not eat food after midnight the night before your procedure. NO GUM OR CANDY AFTER MIDNIGHT. You may drink clear liquids up to 2 hours before you are scheduled to arrive at the hospital for your procedure.  Do not drink clear liquids within 2 hours of your scheduled arrival to the hospital.  Clear liquids include  --Water or Apple juice without pulp  --Clear carbohydrate beverage such as ClearFast or Gatorade  --Black Coffee or Clear Tea (No milk, no creamers, do not add anything to the coffee or Tea   ____Ensure clear carbohydrate drink on the way to the hospital for bariatric patients  ____Ensure clear carbohydrate drink 3 hours before surgery.    __x__ 2. No Alcohol for 24 hours before or after surgery.   __x__3. No Smoking or e-cigarettes for 24 prior to surgery.  Do not use any chewable tobacco products for at least 6 hour prior to surgery   ____  4. Bring all medications with you on the day of surgery if instructed.    __x__ 5. Notify your doctor if there is any change in your medical condition     (cold, fever, infections).    x___6. On the morning of surgery brush your teeth with toothpaste and water.  You may rinse your mouth with mouth wash if you wish.  Do not swallow any toothpaste or mouthwash.   Do not wear jewelry, make-up, hairpins, clips or nail polish.  Do not wear lotions, powders, or perfumes. You may wear deodorant.  Do not shave 48 hours  prior to surgery. Men may shave face and neck.  Do not bring valuables to the hospital.    [facility] is not responsible for any belongings or valuables.               Contacts, dentures or bridgework may not be worn into surgery.  Leave your suitcase in the car. After surgery it may be brought to your room.  For patients admitted to the hospital, discharge time is determined by your treatment team.  _  Patients discharged the day of surgery will not be allowed to drive home.  You will need someone to drive you home and stay with you the night of your procedure.    Please read over the following fact sheets that you were given:   [facility] Preparing for Surgery and or MRSA Information   _x___ TAKE THE FOLLOWING MEDICATION THE MORNING OF SURGERY WITH A SMALL SIP OF WATER. These include:  1. PRILOSEC (OMEPRAZOLE)  2. TAKE AN EXTRA PRILOSEC THE NIGHT BEFORE YOUR SURGERY  3.  4.  5.  6.  ____Fleets enema or Magnesium Citrate as directed.   _x___ Use CHG Soap or sage wipes as directed on instruction sheet   _X___ Use inhalers on the day of surgery and bring to hospital day of [place]  ____ Stop Metformin and Janumet 2 days prior to surgery.  ____ Take 1/2 of usual insulin dose the night before surgery and none on the morning surgery.   ____ Follow recommendations from Cardiologist, Pulmonologist or PCP regarding stopping Aspirin, Coumadin, Plavix ,Eliquis, Effient, or Pradaxa, and Pletal.  X____Stop Anti-inflammatories such as Advil, Aleve, Ibuprofen, Motrin, Naproxen, Naprosyn, Goodies powders or aspirin products NOW-OK to take Tylenol    ____ Stop supplements until after surgery.    ____ Bring C-Pap to the hospital.

## 2019-02-13 ENCOUNTER — Other Ambulatory Visit
Admission: RE | Admit: 2019-02-13 | Discharge: 2019-02-13 | Disposition: A | Discharge status: Home / Self Care | Payer: Managed Care, Other (non HMO) | Source: Ambulatory Visit | Attending: Neurosurgery | Admitting: Neurosurgery

## 2019-02-13 ENCOUNTER — Other Ambulatory Visit: Payer: Self-pay

## 2019-02-13 DIAGNOSIS — Z01812 Encounter for preprocedural laboratory examination: Secondary | ICD-10-CM | POA: Diagnosis not present

## 2019-02-13 LAB — SARS CORONAVIRUS 2 (TAT 6-24 HRS): SARS Coronavirus 2: NEGATIVE

## 2019-02-15 ENCOUNTER — Inpatient Hospital Stay
Admission: RE | Admit: 2019-02-15 | Discharge: 2019-02-16 | DRG: 472 | Disposition: A | Discharge status: Home / Self Care | Payer: Managed Care, Other (non HMO) | Attending: Neurosurgery | Admitting: Neurosurgery

## 2019-02-15 ENCOUNTER — Inpatient Hospital Stay: Payer: Managed Care, Other (non HMO) | Admitting: Certified Registered Nurse Anesthetist

## 2019-02-15 ENCOUNTER — Encounter
Admission: RE | Disposition: A | Discharge status: Home / Self Care | Payer: Self-pay | Source: Home / Self Care | Attending: Neurosurgery

## 2019-02-15 ENCOUNTER — Inpatient Hospital Stay: Payer: Managed Care, Other (non HMO)

## 2019-02-15 ENCOUNTER — Encounter: Payer: Self-pay | Admitting: *Deleted

## 2019-02-15 ENCOUNTER — Other Ambulatory Visit: Payer: Self-pay

## 2019-02-15 DIAGNOSIS — M4802 Spinal stenosis, cervical region: Principal | ICD-10-CM | POA: Diagnosis present

## 2019-02-15 DIAGNOSIS — Z981 Arthrodesis status: Secondary | ICD-10-CM

## 2019-02-15 DIAGNOSIS — Z8614 Personal history of Methicillin resistant Staphylococcus aureus infection: Secondary | ICD-10-CM

## 2019-02-15 DIAGNOSIS — F17210 Nicotine dependence, cigarettes, uncomplicated: Secondary | ICD-10-CM | POA: Diagnosis present

## 2019-02-15 DIAGNOSIS — M4712 Other spondylosis with myelopathy, cervical region: Secondary | ICD-10-CM | POA: Diagnosis present

## 2019-02-15 DIAGNOSIS — J449 Chronic obstructive pulmonary disease, unspecified: Secondary | ICD-10-CM | POA: Diagnosis present

## 2019-02-15 DIAGNOSIS — Z419 Encounter for procedure for purposes other than remedying health state, unspecified: Secondary | ICD-10-CM

## 2019-02-15 HISTORY — PX: ANTERIOR CERVICAL DECOMPRESSION/DISCECTOMY FUSION 4 LEVELS: SHX5556

## 2019-02-15 LAB — ABO/RH: ABO/RH(D): A POS

## 2019-02-15 SURGERY — ANTERIOR CERVICAL DECOMPRESSION/DISCECTOMY FUSION 4 LEVELS
Anesthesia: General

## 2019-02-15 MED ORDER — SUCCINYLCHOLINE CHLORIDE 20 MG/ML IJ SOLN
INTRAMUSCULAR | Status: DC | PRN
  Administered 2019-02-15: 100 mg via INTRAVENOUS

## 2019-02-15 MED ORDER — THROMBIN 5000 UNITS EX SOLR
CUTANEOUS | Status: DC | PRN
  Administered 2019-02-15: 5000 [IU] via TOPICAL

## 2019-02-15 MED ORDER — ONDANSETRON HCL 4 MG PO TABS: 4.0000 mg | ORAL_TABLET | Freq: Four times a day (QID) | ORAL | Status: DC | PRN

## 2019-02-15 MED ORDER — PROPOFOL 10 MG/ML IV BOLUS
INTRAVENOUS | Status: AC
  Filled 2019-02-15: qty 20

## 2019-02-15 MED ORDER — SODIUM CHLORIDE 0.9 % IR SOLN
Status: DC | PRN
  Administered 2019-02-15: 500 mL

## 2019-02-15 MED ORDER — SUCCINYLCHOLINE CHLORIDE 20 MG/ML IJ SOLN
INTRAMUSCULAR | Status: AC
  Filled 2019-02-15: qty 1

## 2019-02-15 MED ORDER — EPHEDRINE SULFATE 50 MG/ML IJ SOLN
INTRAMUSCULAR | Status: AC
  Filled 2019-02-15: qty 1

## 2019-02-15 MED ORDER — SODIUM CHLORIDE FLUSH 0.9 % IV SOLN
INTRAVENOUS | Status: AC
  Filled 2019-02-15: qty 10

## 2019-02-15 MED ORDER — ONDANSETRON HCL 4 MG/2ML IJ SOLN
INTRAMUSCULAR | Status: DC | PRN
  Administered 2019-02-15: 4 mg via INTRAVENOUS

## 2019-02-15 MED ORDER — FAMOTIDINE 20 MG PO TABS
ORAL_TABLET | ORAL | Status: AC
  Filled 2019-02-15: qty 1

## 2019-02-15 MED ORDER — MIDAZOLAM HCL 2 MG/2ML IJ SOLN
INTRAMUSCULAR | Status: AC
  Filled 2019-02-15: qty 2

## 2019-02-15 MED ORDER — PROPOFOL 500 MG/50ML IV EMUL
INTRAVENOUS | Status: DC | PRN
  Administered 2019-02-15: 125 ug/kg/min via INTRAVENOUS

## 2019-02-15 MED ORDER — SODIUM CHLORIDE 0.9 % IV SOLN
INTRAVENOUS | Status: DC | PRN
  Administered 2019-02-15: 50 ug/min via INTRAVENOUS

## 2019-02-15 MED ORDER — HYDROMORPHONE HCL 1 MG/ML IJ SOLN: 0.5000 mg | INTRAMUSCULAR | Status: DC | PRN

## 2019-02-15 MED ORDER — FENTANYL CITRATE (PF) 100 MCG/2ML IJ SOLN
INTRAMUSCULAR | Status: DC | PRN
  Administered 2019-02-15 (×2): 50 ug via INTRAVENOUS

## 2019-02-15 MED ORDER — MAGNESIUM CITRATE PO SOLN
1.0000 | Freq: Once | ORAL | Status: DC | PRN
  Filled 2019-02-15: qty 296

## 2019-02-15 MED ORDER — PHENYLEPHRINE HCL (PRESSORS) 10 MG/ML IV SOLN
INTRAVENOUS | Status: AC
  Filled 2019-02-15: qty 1

## 2019-02-15 MED ORDER — METHOCARBAMOL 1000 MG/10ML IJ SOLN
500.0000 mg | Freq: Four times a day (QID) | INTRAVENOUS | Status: DC
  Filled 2019-02-15 (×3): qty 5

## 2019-02-15 MED ORDER — VANCOMYCIN HCL 1.5 G IV SOLR
1500.0000 mg | Freq: Once | INTRAVENOUS | Status: AC
  Administered 2019-02-15: 1500 mg via INTRAVENOUS
  Filled 2019-02-15: qty 1500

## 2019-02-15 MED ORDER — SENNA 8.6 MG PO TABS
1.0000 | ORAL_TABLET | Freq: Two times a day (BID) | ORAL | Status: DC
  Administered 2019-02-15 – 2019-02-16 (×2): 8.6 mg via ORAL
  Filled 2019-02-15 (×3): qty 1

## 2019-02-15 MED ORDER — PROPOFOL 500 MG/50ML IV EMUL
INTRAVENOUS | Status: AC
  Filled 2019-02-15: qty 100

## 2019-02-15 MED ORDER — PANTOPRAZOLE SODIUM 40 MG PO TBEC
40.0000 mg | DELAYED_RELEASE_TABLET | Freq: Every day | ORAL | Status: DC
  Administered 2019-02-16: 40 mg via ORAL
  Filled 2019-02-15: qty 1

## 2019-02-15 MED ORDER — SODIUM CHLORIDE 0.9 % IV SOLN: INTRAVENOUS | Status: DC | PRN

## 2019-02-15 MED ORDER — LACTATED RINGERS IV SOLN
INTRAVENOUS | Status: DC
  Administered 2019-02-15: 09:00:00 via INTRAVENOUS

## 2019-02-15 MED ORDER — CEFAZOLIN SODIUM-DEXTROSE 2-4 GM/100ML-% IV SOLN
2.0000 g | Freq: Once | INTRAVENOUS | Status: AC
  Administered 2019-02-15: 2 g via INTRAVENOUS

## 2019-02-15 MED ORDER — MORPHINE SULFATE (PF) 4 MG/ML IV SOLN
INTRAVENOUS | Status: AC
  Administered 2019-02-15: 2 mg via INTRAVENOUS
  Filled 2019-02-15: qty 1

## 2019-02-15 MED ORDER — SODIUM CHLORIDE (PF) 0.9 % IJ SOLN
INTRAMUSCULAR | Status: DC | PRN
  Administered 2019-02-15: 10 mL via INTRAVENOUS

## 2019-02-15 MED ORDER — ONDANSETRON HCL 4 MG/2ML IJ SOLN: 4.0000 mg | Freq: Four times a day (QID) | INTRAMUSCULAR | Status: DC | PRN

## 2019-02-15 MED ORDER — LIDOCAINE HCL 4 % MT SOLN
OROMUCOSAL | Status: DC | PRN
  Administered 2019-02-15: 4 mL via TOPICAL

## 2019-02-15 MED ORDER — ACETAMINOPHEN 500 MG PO TABS
1000.0000 mg | ORAL_TABLET | Freq: Four times a day (QID) | ORAL | Status: AC
  Administered 2019-02-15 – 2019-02-16 (×4): 1000 mg via ORAL
  Filled 2019-02-15 (×4): qty 2

## 2019-02-15 MED ORDER — METHOCARBAMOL 500 MG PO TABS
500.0000 mg | ORAL_TABLET | Freq: Four times a day (QID) | ORAL | Status: DC
  Administered 2019-02-15 – 2019-02-16 (×4): 500 mg via ORAL
  Filled 2019-02-15 (×5): qty 1

## 2019-02-15 MED ORDER — LIDOCAINE HCL (CARDIAC) PF 100 MG/5ML IV SOSY
PREFILLED_SYRINGE | INTRAVENOUS | Status: DC | PRN
  Administered 2019-02-15: 100 mg via INTRAVENOUS

## 2019-02-15 MED ORDER — CEFAZOLIN SODIUM-DEXTROSE 2-4 GM/100ML-% IV SOLN
INTRAVENOUS | Status: AC
  Filled 2019-02-15: qty 100

## 2019-02-15 MED ORDER — DEXAMETHASONE SODIUM PHOSPHATE 4 MG/ML IJ SOLN
4.0000 mg | Freq: Three times a day (TID) | INTRAMUSCULAR | Status: DC
  Administered 2019-02-15 – 2019-02-16 (×3): 4 mg via INTRAVENOUS
  Filled 2019-02-15 (×4): qty 1

## 2019-02-15 MED ORDER — ROCURONIUM BROMIDE 100 MG/10ML IV SOLN
INTRAVENOUS | Status: DC | PRN
  Administered 2019-02-15: 5 mg via INTRAVENOUS

## 2019-02-15 MED ORDER — ACETAMINOPHEN 325 MG PO TABS: 650.0000 mg | ORAL_TABLET | ORAL | Status: DC | PRN

## 2019-02-15 MED ORDER — ONDANSETRON HCL 4 MG/2ML IJ SOLN
INTRAMUSCULAR | Status: AC
  Filled 2019-02-15: qty 2

## 2019-02-15 MED ORDER — MORPHINE SULFATE (PF) 4 MG/ML IV SOLN
2.0000 mg | INTRAVENOUS | Status: AC | PRN
  Administered 2019-02-15 (×5): 2 mg via INTRAVENOUS

## 2019-02-15 MED ORDER — MORPHINE SULFATE (PF) 4 MG/ML IV SOLN: 2.0000 mg | INTRAVENOUS | Status: DC | PRN

## 2019-02-15 MED ORDER — BUPIVACAINE-EPINEPHRINE (PF) 0.5% -1:200000 IJ SOLN
INTRAMUSCULAR | Status: DC | PRN
  Administered 2019-02-15: 8 mL

## 2019-02-15 MED ORDER — PHENOL 1.4 % MT LIQD
1.0000 | OROMUCOSAL | Status: DC | PRN
  Filled 2019-02-15: qty 177

## 2019-02-15 MED ORDER — SODIUM CHLORIDE 0.9% FLUSH: 3.0000 mL | INTRAVENOUS | Status: DC | PRN

## 2019-02-15 MED ORDER — OXYCODONE HCL 5 MG PO TABS
10.0000 mg | ORAL_TABLET | ORAL | Status: DC | PRN
  Administered 2019-02-15 – 2019-02-16 (×3): 10 mg via ORAL
  Filled 2019-02-15 (×3): qty 2

## 2019-02-15 MED ORDER — ROCURONIUM BROMIDE 50 MG/5ML IV SOLN
INTRAVENOUS | Status: AC
  Filled 2019-02-15: qty 1

## 2019-02-15 MED ORDER — SODIUM CHLORIDE 0.9% FLUSH: 3.0000 mL | Freq: Two times a day (BID) | INTRAVENOUS | Status: DC

## 2019-02-15 MED ORDER — KETAMINE HCL 50 MG/ML IJ SOLN
INTRAMUSCULAR | Status: DC | PRN
  Administered 2019-02-15: 50 mg via INTRAMUSCULAR

## 2019-02-15 MED ORDER — SODIUM CHLORIDE 0.9 % IV SOLN: 250.0000 mL | INTRAVENOUS | Status: DC

## 2019-02-15 MED ORDER — FENTANYL CITRATE (PF) 100 MCG/2ML IJ SOLN
INTRAMUSCULAR | Status: AC
  Filled 2019-02-15: qty 2

## 2019-02-15 MED ORDER — BISACODYL 5 MG PO TBEC: 5.0000 mg | DELAYED_RELEASE_TABLET | Freq: Every day | ORAL | Status: DC | PRN

## 2019-02-15 MED ORDER — REMIFENTANIL HCL 1 MG IV SOLR
INTRAVENOUS | Status: AC
  Filled 2019-02-15: qty 2000

## 2019-02-15 MED ORDER — ACETAMINOPHEN 650 MG RE SUPP: 650.0000 mg | RECTAL | Status: DC | PRN

## 2019-02-15 MED ORDER — SODIUM CHLORIDE 0.9 % IV SOLN
INTRAVENOUS | Status: DC | PRN
  Administered 2019-02-15: .1 ug/kg/min via INTRAVENOUS

## 2019-02-15 MED ORDER — OXYCODONE HCL 5 MG PO TABS
5.0000 mg | ORAL_TABLET | ORAL | Status: DC | PRN
  Administered 2019-02-15 – 2019-02-16 (×2): 5 mg via ORAL
  Filled 2019-02-15 (×3): qty 1

## 2019-02-15 MED ORDER — BACITRACIN 50000 UNITS IM SOLR
INTRAMUSCULAR | Status: AC
  Filled 2019-02-15: qty 1

## 2019-02-15 MED ORDER — SODIUM CHLORIDE 0.9 % IV SOLN
INTRAVENOUS | Status: DC
  Administered 2019-02-15 – 2019-02-16 (×2): via INTRAVENOUS

## 2019-02-15 MED ORDER — EPHEDRINE SULFATE 50 MG/ML IJ SOLN
INTRAMUSCULAR | Status: DC | PRN
  Administered 2019-02-15 (×2): 15 mg via INTRAVENOUS

## 2019-02-15 MED ORDER — PROPOFOL 10 MG/ML IV BOLUS
INTRAVENOUS | Status: DC | PRN
  Administered 2019-02-15: 150 mg via INTRAVENOUS

## 2019-02-15 MED ORDER — LIDOCAINE HCL (PF) 2 % IJ SOLN
INTRAMUSCULAR | Status: AC
  Filled 2019-02-15: qty 10

## 2019-02-15 MED ORDER — ACETAMINOPHEN 10 MG/ML IV SOLN
INTRAVENOUS | Status: DC | PRN
  Administered 2019-02-15: 1000 mg via INTRAVENOUS

## 2019-02-15 MED ORDER — DEXAMETHASONE SODIUM PHOSPHATE 10 MG/ML IJ SOLN
INTRAMUSCULAR | Status: DC | PRN
  Administered 2019-02-15: 10 mg via INTRAVENOUS

## 2019-02-15 MED ORDER — ACETAMINOPHEN 10 MG/ML IV SOLN
INTRAVENOUS | Status: AC
  Filled 2019-02-15: qty 100

## 2019-02-15 MED ORDER — BUPIVACAINE-EPINEPHRINE (PF) 0.5% -1:200000 IJ SOLN
INTRAMUSCULAR | Status: AC
  Filled 2019-02-15: qty 30

## 2019-02-15 MED ORDER — MENTHOL 3 MG MT LOZG
1.0000 | LOZENGE | OROMUCOSAL | Status: DC | PRN
  Filled 2019-02-15: qty 9

## 2019-02-15 MED ORDER — PROPOFOL 500 MG/50ML IV EMUL
INTRAVENOUS | Status: AC
  Filled 2019-02-15: qty 50

## 2019-02-15 MED ORDER — LACTATED RINGERS IV SOLN
INTRAVENOUS | Status: DC | PRN
  Administered 2019-02-15: 10:00:00 via INTRAVENOUS

## 2019-02-15 MED ORDER — OMEPRAZOLE MAGNESIUM 20 MG PO TBEC: 20.0000 mg | DELAYED_RELEASE_TABLET | ORAL | Status: DC

## 2019-02-15 MED ORDER — POLYETHYLENE GLYCOL 3350 17 G PO PACK: 17.0000 g | PACK | Freq: Every day | ORAL | Status: DC | PRN

## 2019-02-15 MED ORDER — DEXAMETHASONE SODIUM PHOSPHATE 10 MG/ML IJ SOLN
INTRAMUSCULAR | Status: AC
  Filled 2019-02-15: qty 1

## 2019-02-15 MED ORDER — MOMETASONE FURO-FORMOTEROL FUM 200-5 MCG/ACT IN AERO
2.0000 | INHALATION_SPRAY | Freq: Two times a day (BID) | RESPIRATORY_TRACT | Status: DC
  Administered 2019-02-15 – 2019-02-16 (×2): 2 via RESPIRATORY_TRACT
  Filled 2019-02-15: qty 8.8

## 2019-02-15 MED ORDER — THROMBIN 5000 UNITS EX SOLR
CUTANEOUS | Status: AC
  Filled 2019-02-15: qty 10000

## 2019-02-15 MED ORDER — MIDAZOLAM HCL 2 MG/2ML IJ SOLN
INTRAMUSCULAR | Status: DC | PRN
  Administered 2019-02-15: 2 mg via INTRAVENOUS

## 2019-02-15 SURGICAL SUPPLY — 57 items
ALLOGRAFT LORDOTIC CC 7X11X14 (Bone Implant) ×8 IMPLANT
BULB RESERV EVAC DRAIN JP 100C (MISCELLANEOUS) ×2 IMPLANT
BUR NEURO DRILL SOFT 3.0X3.8M (BURR) ×2 IMPLANT
CANISTER SUCT 1200ML W/VALVE (MISCELLANEOUS) ×4 IMPLANT
CHLORAPREP W/TINT 26 (MISCELLANEOUS) ×4 IMPLANT
COUNTER NEEDLE 20/40 LG (NEEDLE) ×2 IMPLANT
COVER LIGHT HANDLE STERIS (MISCELLANEOUS) ×4 IMPLANT
COVER WAND RF STERILE (DRAPES) ×2 IMPLANT
CRADLE LAMINECT ARM (MISCELLANEOUS) ×2 IMPLANT
DERMABOND ADVANCED (GAUZE/BANDAGES/DRESSINGS) ×1
DERMABOND ADVANCED .7 DNX12 (GAUZE/BANDAGES/DRESSINGS) ×1 IMPLANT
DRAIN CHANNEL JP 10F RND 20C F (MISCELLANEOUS) ×2 IMPLANT
DRAPE C-ARM 42X72 X-RAY (DRAPES) ×4 IMPLANT
DRAPE LAPAROTOMY 77X122 PED (DRAPES) ×2 IMPLANT
DRAPE MICROSCOPE SPINE 48X150 (DRAPES) ×2 IMPLANT
DRAPE POUCH INSTRU U-SHP 10X18 (DRAPES) ×2 IMPLANT
DRAPE SURG 17X11 SM STRL (DRAPES) ×8 IMPLANT
ELECT CAUTERY BLADE TIP 2.5 (TIP) ×2
ELECT REM PT RETURN 9FT ADLT (ELECTROSURGICAL) ×2
ELECTRODE CAUTERY BLDE TIP 2.5 (TIP) ×1 IMPLANT
ELECTRODE REM PT RTRN 9FT ADLT (ELECTROSURGICAL) ×1 IMPLANT
FEE INTRAOP MONITOR IMPULS NCS (MISCELLANEOUS) ×1 IMPLANT
FRAME EYE SHIELD (PROTECTIVE WEAR) ×4 IMPLANT
GLOVE BIOGEL PI IND STRL 7.0 (GLOVE) ×1 IMPLANT
GLOVE BIOGEL PI INDICATOR 7.0 (GLOVE) ×1
GLOVE SURG SYN 7.0 (GLOVE) ×4 IMPLANT
GLOVE SURG SYN 8.5  E (GLOVE) ×3
GLOVE SURG SYN 8.5 E (GLOVE) ×3 IMPLANT
GOWN SRG XL LVL 3 NONREINFORCE (GOWNS) ×1 IMPLANT
GOWN STRL NON-REIN TWL XL LVL3 (GOWNS) ×1
GOWN STRL REUS W/TWL MED LVL3 (GOWN DISPOSABLE) ×2 IMPLANT
GRADUATE 1200CC STRL 31836 (MISCELLANEOUS) ×2 IMPLANT
INTRAOP MONITOR FEE IMPULS NCS (MISCELLANEOUS) ×1
INTRAOP MONITOR FEE IMPULSE (MISCELLANEOUS) ×1
IV CATH ANGIO 12GX3 LT BLUE (NEEDLE) ×2 IMPLANT
KIT TURNOVER KIT A (KITS) ×2 IMPLANT
MARKER SKIN DUAL TIP RULER LAB (MISCELLANEOUS) ×4 IMPLANT
NEEDLE HYPO 22GX1.5 SAFETY (NEEDLE) ×2 IMPLANT
NS IRRIG 1000ML POUR BTL (IV SOLUTION) ×2 IMPLANT
PACK LAMINECTOMY NEURO (CUSTOM PROCEDURE TRAY) ×2 IMPLANT
PIN CASPAR 14 (PIN) ×1 IMPLANT
PIN CASPAR 14MM (PIN) ×2
PLATE ACP 78 4 LEVEL HYBRID (Plate) ×2 IMPLANT
SCREW AACD 4.0X15 (Screw) ×2 IMPLANT
SCREW ACP 3.5X15 (Screw) ×20 IMPLANT
SPOGE SURGIFLO 8M (HEMOSTASIS) ×2
SPONGE KITTNER 5P (MISCELLANEOUS) ×2 IMPLANT
SPONGE SURGIFLO 8M (HEMOSTASIS) ×2 IMPLANT
SUT SILK 2 0 (SUTURE)
SUT SILK 2-0 18XBRD TIE 12 (SUTURE) IMPLANT
SUT V-LOC 90 ABS DVC 3-0 CL (SUTURE) ×2 IMPLANT
SUT VIC AB 3-0 SH 8-18 (SUTURE) ×2 IMPLANT
SYR 30ML LL (SYRINGE) ×2 IMPLANT
TAPE CLOTH 3X10 WHT NS LF (GAUZE/BANDAGES/DRESSINGS) ×2 IMPLANT
TOWEL OR 17X26 4PK STRL BLUE (TOWEL DISPOSABLE) ×8 IMPLANT
TRAY FOLEY MTR SLVR 16FR STAT (SET/KITS/TRAYS/PACK) ×2 IMPLANT
TUBING CONNECTING 10 (TUBING) ×2 IMPLANT

## 2019-02-15 NOTE — Anesthesia Postprocedure Evaluation (Signed)
Anesthesia Post Note  Patient: Agricultural consultant  Procedure(s) Performed: ANTERIOR CERVICAL DECOMPRESSION/DISCECTOMY FUSION 4 LEVELS C3-7 (N/A )  Patient location during evaluation: PACU Anesthesia Type: General Level of consciousness: awake and alert Pain management: pain level controlled Vital Signs Assessment: post-procedure vital signs reviewed and stable Respiratory status: spontaneous breathing, nonlabored ventilation and respiratory function stable Cardiovascular status: blood pressure returned to baseline and stable Postop Assessment: no apparent nausea or vomiting Anesthetic complications: no     Last Vitals:  Vitals:   02/15/19 1427 02/15/19 1511  BP: 110/79 106/89  Pulse: 77 78  Resp: 13   Temp: 36.6 C (!) 36.4 C  SpO2: 94% 93%    Last Pain:  Vitals:   02/15/19 1511  TempSrc: Oral  PainSc:                  Durenda Hurt

## 2019-02-15 NOTE — Transfer of Care (Signed)
Immediate Anesthesia Transfer of Care Note  Patient: Peter [name]  Procedure(s) Performed: ANTERIOR CERVICAL DECOMPRESSION/DISCECTOMY FUSION 4 LEVELS C3-7 (N/A )  Patient Location: PACU  Anesthesia Type:General  Level of Consciousness: drowsy  Airway & Oxygen Therapy: Patient Spontanous Breathing and Patient connected to face mask oxygen  Post-op Assessment: Report given to RN and Post -op Vital signs reviewed and stable  Post vital signs: Reviewed and stable  Last Vitals:  Vitals Value Taken Time  BP 130/78 02/15/19 1325  Temp    Pulse 90 02/15/19 1327  Resp 15 02/15/19 1327  SpO2 100 % 02/15/19 1327  Vitals shown include unvalidated device data.  Last Pain:  Vitals:   02/15/19 1327  TempSrc:   PainSc: (P) Asleep         Complications: No apparent anesthesia complications

## 2019-02-15 NOTE — Progress Notes (Signed)
Procedure: C3-C7 ACDF Procedure date: 02/15/2019 Diagnosis: Cervical myelopathy  History: Peter [name] is s/p C3-C7 ACDF for cervical myelopathy  POD0: Tolerated procedure well without complication.  Seen in postop recovery still disoriented from anesthesia but able to answer questions and obey commands.  Complains of anterior and posterior neck soreness.  Denies any upper extremity pain/numbness, but he does complain of tingling throughout his upper extremities.  States they were numb prior to surgery.  Denies any lower extremity pain/numbness/tingling.  Physical Exam: Vitals:   02/15/19 1350 02/15/19 1355  BP:  (!) 127/106  Pulse: 81 86  Resp: 13 13  Temp:    SpO2: 100% 100%   Strength:5/5 throughout upper and lower extremities Sensation: Intact and symmetric throughout upper and lower extremities Skin: Glue intact at incision site.  No active bleeding at drain insertion site.  Data:  Recent Labs  Lab 02/10/19 0941  NA 141  K 4.9  CL 106  CO2 28  BUN 23*  CREATININE 0.94  GLUCOSE 92  CALCIUM 9.0   No results for input(s): AST, ALT, ALKPHOS in the last 168 hours.  Invalid input(s): TBILI   Recent Labs  Lab 02/10/19 0941  WBC 5.7  HGB 15.0  HCT 44.8  PLT 191   Recent Labs  Lab 02/10/19 0941  APTT 27  INR 0.9         Other tests/results: Cervical x-rays pending  Assessment/Plan:  [name] POD 0 status post C3-C7 ACDF for cervical myelopathy.  We will continue to monitor.  - monitor drain output - mobilize - pain control - DVT prophylaxis - Brace -DC Foley catheter POD 1  Marin Olp PA-C Department of Neurosurgery

## 2019-02-15 NOTE — Op Note (Signed)
Indications: Peter [name] is suffering from cervical myelopathy. he failed conservative management, and elected to proceed with surgery.  Findings: severe stenosis and spondylosis  Preoperative Diagnosis: Cervical myelopathy Postoperative Diagnosis: same   EBL: 10 ml IVF: 850 ml Drains: 1 placed Disposition: Extubated and Stable to PACU Complications: none  A foley catheter was placed.   Preoperative Note:   Risks of surgery discussed include: infection, bleeding, stroke, coma, death, paralysis, CSF leak, nerve/spinal cord injury, numbness, tingling, weakness, complex regional pain syndrome, recurrent stenosis and/or disc herniation, vascular injury, development of instability, neck/back pain, need for further surgery, persistent symptoms, development of deformity, and the risks of anesthesia. The patient understood these risks and agreed to proceed.  Operative Note:   Procedure: 1. Anterior Cervical Discectomy C3-4 including bilateral foraminotomies and end plate preparation  2. Anterior Cervical Discectomy C4-5 including bilateral foraminotomies and end plate preparation  3. Anterior Cervical Discectomy C5-6 including bilateral foraminotomies and end plate preparation  4. Anterior Cervical Discectomy C6-7 including bilateral foraminotomies and end plate preparation 5. Anterior Spinal Instrumentation C3 to 7 using Nuvasive ACP 6. Anterior arthrodesis from C3 to C7 with placement of structural allograft at C3-4, C4-5, C5-6, and C6-7 7. Use of the operative microscope 8. Use of intraoperative flouroscopy  PROCEDURE IN DETAIL: After obtaining informed consent, the patient taken to the operating room, placed in supine position, general anesthesia induced.  The patient had a small shoulder roll placed behind their shoulders.  The patient received preop antibiotics and IV Decadron.  The patient had a neck incision outlined, was prepped and draped in usual sterile fashion. The incision  was injected with local anesthetic.   An incision was opened, dissection taken down medial to the carotid artery and jugular vein, lateral to the trachea and esophagus.  The prevertebral fascia identified and a localizing x-ray demonstrated the correct level.  The longus colli were dissected laterally, and self-retaining retractors placed to open the operative field. The microscope was then brought into the field.    Caspar pins were placed into the vertebral bodies at C3 and C5. The distractor was then placed at C3-5, and the annuli at C3-4 and C4-5 were opened using a bovie.  Curettes and pituitary rongeurs used to remove the majority of disk, then the drill was used to remove the posterior osteophyte and begin the foraminotomies. The nerve hook was used to elevate the posterior longitudinal ligament, which was then removed with Kerrison rongeurs. The microblunt nerve hook could be passed out the foramen bilaterally at each level.   Meticulous hemostasis was obtained. Structural allograft (Nuvasive Triad 7 mm height) was placed at C3-4. A second structural allograft (Nuvasive Triad 7 mm height) was placed at C4-5.      The distractor was removed, and the [place] retractor repositioned. The pin at C3 was removed and bone wax was used for hemostasis. An additional pin was placed at C7.  The distractor was placed from C5-7, and the annuli at C5-6 and C6-7 were opened using a bovie.  Curettes and pituitary rongeurs used to remove the majority of disk, then the drill was used to remove the posterior osteophyte and begin the foraminotomies. The nerve hook was used to elevate the posterior longitudinal ligament, which was then removed with Kerrison rongeurs. The microblunt nerve hook could be passed out the foramen bilaterally at each level.   Meticulous hemostasis was obtained. Structural allograft (Nuvasive Triad 7 mm height) was placed at C5-6. A second structural allograft Harlin Heys  Triad 7 mm height) was  placed at C6-7.   The caspar distractor was removed and the pins at C5 and C7 were removed.  Bone wax was used for hemostasis.    A five segment, four level plate (78 mm Nuvasive ACP) was chosen.  Two screws placed in the vertebral bodies of all four segments, respectively making sure the screws were behind the locking mechanism.  Final AP and lateral radiographs were taken.   With everything in good position, the wound was irrigated copiously with bacitracin-containing solution and meticulous hemostasis obtained. A drain was placed. Wound was closed in 2 layers using interrupted inverted 3-0 Vicryl sutures in the platysma and 3-0 monocryl in the skin.  The wound was dressed with dermabond, the head of bed at 30 degrees, taken to recovery room in stable condition.  No new postop neurological deficits were identified.  All counts were correct at the end of the case.   Neuromonitoring was used throughout with no changes.   I performed the entire procedure with the assistance of Ivar DrapeAmanda Ferri PA as an Designer, television/film setassistant surgeon.  Venetia Nighthester Young Mulvey MD

## 2019-02-15 NOTE — Anesthesia Preprocedure Evaluation (Addendum)
Anesthesia Evaluation  Patient identified by MRN, date of birth, ID band Patient awake    Reviewed: Allergy & Precautions, H&P , NPO status , Patient's Chart, lab work & pertinent test results  Airway Mallampati: II  TM Distance: >3 FB Neck ROM: limited    Dental  (+) Chipped   Pulmonary COPD,  COPD inhaler, Current Smoker and Patient abstained from smoking.,           Cardiovascular (-) angina(-) Past MI, (-) Cardiac Stents and (-) CABG negative cardio ROS  (-) dysrhythmias      Neuro/Psych  Headaches, Cervical radiculopathy negative psych ROS   GI/Hepatic Neg liver ROS, GERD  Controlled,  Endo/Other  negative endocrine ROS  Renal/GU      Musculoskeletal   Abdominal   Peds  Hematology negative hematology ROS (+)   Anesthesia Other Findings Past Medical History: No date: Arthritis     Comment:  neck, shoulders No date: COPD (chronic obstructive pulmonary disease) (HCC) No date: Diverticulitis No date: Generalized headaches No date: GERD (gastroesophageal reflux disease)     Comment:  occ 09/08/2018: History of colonoscopy with polypectomy     Comment:  Aug 16, 2015 No date: Lower back pain No date: Rectal pain No date: Thrombosed hemorrhoids  Past Surgical History: 1988: APPENDECTOMY 09/11/2015: BLADDER REPAIR; N/A     Comment:  Procedure: BLADDER REPAIR;  Surgeon: Nickie Retort,              MD;  Location: [facility];  Service: Urology;  Laterality:               N/A; 1989: CHOLECYSTECTOMY No date: COLON SURGERY     Comment:  for diverticulitis 08/16/2015: COLONOSCOPY WITH PROPOFOL; N/A     Comment:  Procedure: COLONOSCOPY WITH PROPOFOL;  Surgeon: Lucilla Lame, MD;  Location: [facility];  Service:               Endoscopy;  Laterality: N/A; 09/11/2015: CYSTOSCOPY WITH STENT PLACEMENT; Bilateral     Comment:  Procedure: CYSTOSCOPY WITH STENT PLACEMENT;  Surgeon:               Nickie Retort, MD;  Location: [facility];  Service:               Urology;  Laterality: Bilateral; 11/19/2015: ILEOSTOMY CLOSURE; N/A     Comment:  Procedure: ILEOSTOMY TAKEDOWN;  Surgeon: Hubbard Robinson, MD;  Location: [facility];  Service: General;                Laterality: N/A; 09/11/2015: LAPAROSCOPIC PARTIAL COLECTOMY; N/A     Comment:  Procedure: LAPAROSCOPIC PARTIAL COLECTOMY Possible Open,              possible ostomy, repair of colovesical fistula Cystoscopy              and stent placement, repair of colovesical fistula with               Dr. Pilar Jarvis as well ;  Surgeon: Hubbard Robinson, MD;                Location: [facility];  Service: General;  Laterality: N/A; 08/16/2015: POLYPECTOMY     Comment:  Procedure: POLYPECTOMY;  Surgeon: Lucilla Lame, MD;  Location: [facility];  Service: Endoscopy;; 1970: TONSILLECTOMY  BMI    Body Mass Index: 31.25 kg/m      Reproductive/Obstetrics negative OB ROS                            Anesthesia Physical Anesthesia Plan  ASA: II  Anesthesia Plan: General ETT   Post-op Pain Management:    Induction:   PONV Risk Score and Plan: Ondansetron, Dexamethasone, Midazolam and Treatment may vary due to age or medical condition  Airway Management Planned:   Additional Equipment:   Intra-op Plan:   Post-operative Plan:   Informed Consent: I have reviewed the patients History and Physical, chart, labs and discussed the procedure including the risks, benefits and alternatives for the proposed anesthesia with the patient or authorized representative who has indicated his/her understanding and acceptance.     Dental Advisory Given  Plan Discussed with: Anesthesiologist and CRNA  Anesthesia Plan Comments:        Anesthesia Quick Evaluation

## 2019-02-15 NOTE — H&P (Signed)
History of Present Illness: 02/15/2019 [name] returns to medical attention with continued symptoms of cervical myelopathy.   01/12/2019 Mr. Race Latour is here today with a chief complaint of neck discomfort, weakness in his arms, loss of dexterity in his hands, tingling and numbness in his hands, and balance issues. These have been worsening over the past several weeks and prompted a emergency department visit last night. He reports that he has been able to do his work, but he has noticed increasing difficulty with using his hands. He has dropped keys on multiple occasions. He is also felt unsteady on his feet, though he has not had a clear fall. He has noticed difficulty with opening jars and holding items that he previously would not of had trouble with.  Past Surgery: none  Troi Florendo has clear and progressive symptoms of cervical myelopathy.  The symptoms are causing a significant impact on the patient's life.   Review of Systems:  A 10 point review of systems is negative, except for the pertinent positives and negatives detailed in the HPI.  Past Medical History: Past Medical History:  Diagnosis Date  . Asthma, unspecified asthma severity, unspecified whether complicated, unspecified whether persistent  . Diverticulitis 2011  . MRSA (methicillin resistant staph aureus) culture positive   Past Surgical History: Past Surgical History:  Procedure Laterality Date  . APPENDECTOMY  . CHOLECYSTECTOMY  . COLONOSCOPY   Allergies: No Known Allergies   Medications:  Current Meds  Medication Sig  . Fluticasone-Salmeterol (ADVAIR DISKUS) 250-50 MCG/DOSE AEPB Inhale 1 puff into the lungs 2 (two) times daily.  Marland Kitchen omeprazole (PRILOSEC OTC) 20 MG tablet Take 20 mg by mouth every morning.    Social History: Social History   Tobacco Use  . Smoking status: Current Every Day Smoker  Packs/day: 1.00  Types: Cigarettes  . Smokeless tobacco: Never Used  Substance Use Topics  .  Alcohol use: No  Alcohol/week: 0.0 standard drinks  . Drug use: No   Family Medical History: Family History  Problem Relation Age of Onset  . Colon cancer Father  . Lung cancer Paternal Grandmother   Physical Examination:  Today's Vitals   02/15/19 0816  BP: 124/78  Pulse: 72  Resp: 16  Temp: (!) 97.4 F (36.3 C)  TempSrc: Oral  SpO2: 100%  Weight: 96 kg  Height: 5\' 9"  (1.753 m)  PainSc: 5    Body mass index is 31.25 kg/m.  Heart sounds normal no MRG. Chest Clear to Auscultation Bilaterally.   General: Patient is well developed, well nourished, calm, collected, and in no apparent distress. Attention to examination is appropriate.  Psychiatric: Patient is non-anxious.  Head: Pupils equal, round, and reactive to light.  ENT: Oral mucosa appears well hydrated.  Neck: Supple. Full range of motion.  Respiratory: Patient is breathing without any difficulty.  Extremities: No edema.  Vascular: Palpable dorsal pedal pulses.  Skin: On exposed skin, there are no abnormal skin lesions.  NEUROLOGICAL:   Awake, alert, oriented to person, place, and time. Speech is clear and fluent. Fund of knowledge is appropriate.   Cranial Nerves: Pupils equal round and reactive to light. Facial tone is symmetric. Facial sensation is symmetric. Shoulder shrug is symmetric. Tongue protrusion is midline. There is no pronator drift.  ROM of spine: full. Palpation of spine: non tender.   Strength: Side Biceps Triceps Deltoid Interossei Grip Wrist Ext. Wrist Flex.  R 5 4+ 4+ 4+ 4 5 5   L 5 5 5 5  4+  5 5   Side Iliopsoas Quads Hamstring PF DF EHL  R 5 5 5 5 5 5   L 5 5 5 5 5 5    Reflexes are 2+ and symmetric at the biceps, triceps, brachioradialis, patella and achilles. Hoffman's is present on the right. Clonus is not present. Toes are down-going.  Bilateral upper and lower extremity sensation is intact to light touch.  Gait is abnormal and wide-based. Mild to moderate difficulty with  tandem gait.  No evidence of dysmetria noted.  Medical Decision Making  Imaging: MRI C spine 01/11/2019 MRI CERVICAL SPINE IMPRESSION:  1. Advanced multilevel cervical spondylolysis at C3-4 through C6-7 with resultant moderate to severe diffuse spinal stenosis. Secondary cord flattening and impingement at multiple levels without cord signal changes. 2. Multifactorial degenerative changes with resultant multilevel foraminal narrowing as above. Notable findings include moderate right C3 foraminal stenosis, severe bilateral C4 foraminal narrowing, moderate bilateral C5 foraminal stenosis, with severe bilateral C6 and C7 foraminal stenosis.  Electronically Signed By: Rise MuBenjamin McClintock M.D. On: 01/11/2019 23:59  I have personally reviewed the images and agree with the above interpretation.  Assessment and Plan: Mr. Lorraine Laxorcher is a pleasant 53 y.o. male with progressive cervical spondylotic myelopathy. He was evaluated in the emergency department and shown to have severe spinal stenosis with multiple disc herniations from C3-4 to C6-7.  Given the progressive nature of his symptoms, I would not recommend conservative management. I have recommended C3-7 anterior cervical discectomy and fusion.  Aradhana Gin K. Myer HaffYarbrough MD, MPHS Dept. of Neurosurgery

## 2019-02-15 NOTE — Anesthesia Procedure Notes (Signed)
Procedure Name: Intubation Date/Time: 02/15/2019 9:36 AM Performed by: Eben Burow, CRNA Pre-anesthesia Checklist: Patient identified, Emergency Drugs available, Suction available, Patient being monitored and Timeout performed Patient Re-evaluated:Patient Re-evaluated prior to induction Oxygen Delivery Method: Circle system utilized Preoxygenation: Pre-oxygenation with 100% oxygen Induction Type: IV induction Ventilation: Mask ventilation without difficulty Laryngoscope Size: McGraph and 4 Grade View: Grade I Tube type: Oral Tube size: 8.0 mm Number of attempts: 1 Airway Equipment and Method: Stylet,  Video-laryngoscopy and LTA kit utilized Placement Confirmation: positive ETCO2 and breath sounds checked- equal and bilateral Secured at: 23 cm Tube secured with: Tape Dental Injury: Teeth and Oropharynx as per pre-operative assessment

## 2019-02-15 NOTE — Anesthesia Post-op Follow-up Note (Signed)
Anesthesia QCDR form completed.        

## 2019-02-16 ENCOUNTER — Encounter: Payer: Self-pay | Admitting: Neurosurgery

## 2019-02-16 ENCOUNTER — Inpatient Hospital Stay: Payer: Managed Care, Other (non HMO)

## 2019-02-16 DIAGNOSIS — M4712 Other spondylosis with myelopathy, cervical region: Secondary | ICD-10-CM | POA: Diagnosis present

## 2019-02-16 DIAGNOSIS — F17210 Nicotine dependence, cigarettes, uncomplicated: Secondary | ICD-10-CM | POA: Diagnosis present

## 2019-02-16 DIAGNOSIS — J449 Chronic obstructive pulmonary disease, unspecified: Secondary | ICD-10-CM | POA: Diagnosis present

## 2019-02-16 DIAGNOSIS — Z8614 Personal history of Methicillin resistant Staphylococcus aureus infection: Secondary | ICD-10-CM | POA: Diagnosis not present

## 2019-02-16 DIAGNOSIS — M50023 Cervical disc disorder at C6-C7 level with myelopathy: Secondary | ICD-10-CM | POA: Diagnosis present

## 2019-02-16 DIAGNOSIS — M4802 Spinal stenosis, cervical region: Secondary | ICD-10-CM | POA: Diagnosis present

## 2019-02-16 MED ORDER — OXYCODONE HCL 5 MG PO TABS: 5.0000 mg | ORAL_TABLET | ORAL | 0 refills | Status: AC | PRN

## 2019-02-16 MED ORDER — METHOCARBAMOL 500 MG PO TABS: 500.0000 mg | ORAL_TABLET | Freq: Four times a day (QID) | ORAL | 0 refills | Status: DC | PRN

## 2019-02-16 NOTE — Progress Notes (Signed)
Patient discharged home. Patient's mother picked him up. Discharge reviewed, patient reports no questions. Peter [name]

## 2019-02-16 NOTE — Discharge Summary (Signed)
Procedure: C3-C7 ACDF Procedure date: 02/15/2019 Diagnosis: Cervical myelopathy  History: Peter [name] is s/p C3-C7 ACDF for cervical myelopathy  POD1: Doing well.  States he is up to the bathroom.  Swallowing well.  Has expected neck pain.  Arms feel better, and has improved balance.  Update: Drain output 50   POD0: Tolerated procedure well without complication.  Seen in postop recovery still disoriented from anesthesia but able to answer questions and obey commands.  Complains of anterior and posterior neck soreness.  Denies any upper extremity pain/numbness, but he does complain of tingling throughout his upper extremities.  States they were numb prior to surgery.  Denies any lower extremity pain/numbness/tingling.  Physical Exam: Vitals:   02/16/19 1142 02/16/19 1531  BP: 126/77 128/79  Pulse: 65 71  Resp:    Temp: 98.5 F (36.9 C) 98.2 F (36.8 C)  SpO2: 93% 97%   Strength:5/5 throughout upper and lower extremities Sensation: Intact and symmetric throughout upper and lower extremities Skin: Glue intact at incision site.  Drain pulled without issue. Dressed with gauze and tape.   Data:  Recent Labs  Lab 02/10/19 0941  NA 141  K 4.9  CL 106  CO2 28  BUN 23*  CREATININE 0.94  GLUCOSE 92  CALCIUM 9.0   No results for input(s): AST, ALT, ALKPHOS in the last 168 hours.  Invalid input(s): TBILI   Recent Labs  Lab 02/10/19 0941  WBC 5.7  HGB 15.0  HCT 44.8  PLT 191   Recent Labs  Lab 02/10/19 0941  APTT 27  INR 0.9          Assessment/Plan:  Peter [name] POD 1 status post C3-C7 ACDF for cervical myelopathy.  He is doing well. Will continue post op pain control with tylenol, robaxin, and pain medication as needed. He is scheduled to follow up in approximately 2 weeks to monitor progress.    Marin Olp PA-C Department of Neurosurgery

## 2019-02-16 NOTE — Evaluation (Signed)
Occupational Therapy Evaluation Patient Details Name: Peter BattyMark A [name] MRN: [id number] DOB: 12/21/1965 Today's Date: 02/16/2019    History of Present Illness 53 y/o male s/p C3-C7 ACDF 8/12 secondary to neck discomfort, weakness in his arms, loss of dexterity in his hands, tingling and numbness in his hands, and balance issues; all of which have been progressivly becoming more problematic recently.    Clinical Impression   Pt seen for OT evaluation this date, POD#1 from above surgery. Prior to hospital admission, pt was independent with mobility, ADL, and IADL and working full time in maintenance. No falls in past 12 months. Pt lives with his brother who will be able to assist as needed. Currently pt reports cervical pain, denies sensory deficits bilaterally, and requires PRN Min A for LB ADL tasks 2/2 cervical precautions. Pt educated in cervical precautions, C collar mgt, self care skills, AE, and home/routines modifications to maximize safety and functional independence while minimizing falls risk and maintaining precautions. Handout provided and pt verbalized understanding of all education/training provided. Pt will benefit from skilled OT services while hospitalized to maximize return to PLOF. Recommend f/u therapy as arranged by surgeon.     Follow Up Recommendations  Follow surgeon's recommendation for DC plan and follow-up therapies    Equipment Recommendations  3 in 1 bedside commode;Other (comment)(reacher)    Recommendations for Other Services       Precautions / Restrictions Precautions Precautions: Cervical Precaution Booklet Issued: Yes (comment) Required Braces or Orthoses: Cervical Brace Cervical Brace: At all times(off for shower, in bed, and ambulating to bathroom; apply in sitting) Restrictions Weight Bearing Restrictions: No      Mobility Bed Mobility Overal bed mobility: Modified Independent             General bed mobility comments: Pt able to get up to EOB  and back to supine w/o direct assist, somewhat slow and guarded but not requiring assist  Transfers Overall transfer level: Modified independent Equipment used: None             General transfer comment: Pt able to rise and maintain balance w/o issue, good confidence    Balance Overall balance assessment: Modified Independent                                         ADL either performed or assessed with clinical judgement   ADL                                         General ADL Comments: PRN Min A for LB ADL tasks in order to maintain cervical precautions; pt reports brother is able to provide assist     Vision Baseline Vision/History: Wears glasses Wears Glasses: At all times Patient Visual Report: No change from baseline Vision Assessment?: No apparent visual deficits     Perception     Praxis      Pertinent Vitals/Pain Pain Assessment: 0-10 Pain Score: 7  Pain Location: incision and posterior cervical spine Pain Descriptors / Indicators: Aching Pain Intervention(s): Limited activity within patient's tolerance;Monitored during session;Premedicated before session;Repositioned     Hand Dominance     Extremity/Trunk Assessment Upper Extremity Assessment Upper Extremity Assessment: Overall WFL for tasks assessed(denies sensory deficits since surgery which he was experiencing prior to surgery)  Lower Extremity Assessment Lower Extremity Assessment: Overall WFL for tasks assessed   Cervical / Trunk Assessment Cervical / Trunk Assessment: Normal(anterior incision, aspen collar)   Communication Communication Communication: No difficulties   Cognition Arousal/Alertness: Awake/alert Behavior During Therapy: WFL for tasks assessed/performed Overall Cognitive Status: Within Functional Limits for tasks assessed                                     General Comments       Exercises Other Exercises Other  Exercises: pt instructed in cervical spinal precautions and how to maintain during ADL, functional mobility, falls prevention, cervical collar mgt, and AE/DME for ADL; handout provided to support recall and carryover   Shoulder Instructions      Home Living Family/patient expects to be discharged to:: Private residence Living Arrangements: Other relatives(brother) Available Help at Discharge: Family(near 24/7 assist with brother, mother, girlfiend)   Home Access: Stairs to enter Secretary/administratorntrance Stairs-Number of Steps: 6 Entrance Stairs-Rails: Right       Bathroom Shower/Tub: Chief Strategy OfficerTub/shower unit   Bathroom Toilet: Handicapped height     Home Equipment: None;Shower seat;Hand held shower head   Additional Comments: has shower chair from his brother's previous ortho surgery that he can utilize      Prior Functioning/Environment Level of Independence: Independent        Comments: 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, works in maintainance, no issues        OT Problem List: Pain;Decreased knowledge of precautions;Decreased knowledge of use of DME or AE      OT Treatment/Interventions: Self-care/ADL training;Therapeutic exercise;Therapeutic activities;DME and/or AE instruction;Patient/family education    OT Goals(Current goals can be found in the care plan section) Acute Rehab OT Goals Patient Stated Goal: Go home OT Goal Formulation: With patient Time For Goal Achievement: 03/02/19 Potential to Achieve Goals: Good ADL Goals Pt Will Perform Upper Body Dressing: with modified independence;sitting(maintaining cervical precautions) Pt Will Transfer to Toilet: with modified independence;ambulating(LRAD for amb) Additional ADL Goal #1: Pt will independently perform cervical collar mgt including donning/doffing, proper positioning, and verbalize wear schedule Additional ADL Goal #2: Pt will verbalize 100% of cervical spinal precautions and how to implement during basic ADL tasks.  OT Frequency: Min  1X/week   Barriers to D/C:            Co-evaluation              AM-PAC OT "6 Clicks" Daily Activity     Outcome Measure Help from another person eating meals?: None Help from another person taking care of personal grooming?: None Help from another person toileting, which includes using toliet, bedpan, or urinal?: A Little Help from another person bathing (including washing, rinsing, drying)?: A Little Help from another person to put on and taking off regular upper body clothing?: None Help from another person to put on and taking off regular lower body clothing?: A Little 6 Click Score: 21   End of Session    Activity Tolerance: Patient tolerated treatment well Patient left: in bed;with call bell/phone within reach;with bed alarm set  OT Visit Diagnosis: Other abnormalities of gait and mobility (R26.89);Pain Pain - Right/Left: (anterior and posterior aspects) Pain - part of body: (cervical spine)                Time: [id number] OT Time Calculation (min): 18 min Charges:  OT General Charges $OT Visit: 1 Visit OT Evaluation $  OT Eval Low Complexity: 1 Low OT Treatments $Self Care/Home Management : 8-22 mins  Jeni Salles, MPH, MS, OTR/L ascom [phone] 02/16/19, 11:09 AM

## 2019-02-16 NOTE — Evaluation (Signed)
Physical Therapy Evaluation Patient Details Name: Peter [name] MRN: [id number] DOB: 10/26/1965 Today's Date: 02/16/2019   History of Present Illness  [age] y/o male s/p C3-C7 ACDF 8/12 secondary to neck discomfort, weakness in his arms, loss of dexterity in his hands, tingling and numbness in his hands, and balance issues; all of which have been progressivly becoming more problematic recently.   Clinical Impression  Pt did well with PT exam and showed good mobility and confidence with ambulation, stairs, etc.  He was able to confidently walk at community appropriate speed w/o safety issues and negotiated up/down steps confidently.  Pt overall doing well and showed appropriate coordination and post-op strength. PT f/u per neurosurgery, but at this point pt is safe to go home and does not have a lot of acute PT needs.  Follow Up Recommendations Follow surgeon's recommendation for DC plan and follow-up therapies    Equipment Recommendations  None recommended by PT    Recommendations for Other Services       Precautions / Restrictions Precautions Precautions: Cervical Required Braces or Orthoses: Cervical Brace Restrictions Weight Bearing Restrictions: No      Mobility  Bed Mobility Overal bed mobility: Modified Independent             General bed mobility comments: Pt able to get up to EOB and back to supine w/o direct assist, somewhat slow and guarded but not requiring assist  Transfers Overall transfer level: Modified independent Equipment used: None             General transfer comment: Pt able to rise and maintain balance w/o issue, good confidence  Ambulation/Gait Ambulation/Gait assistance: Modified independent (Device/Increase time) Gait Distance (Feet): 250 Feet Assistive device: None       General Gait Details: Pt walked with good speed and confidence and was able to maintain consistent cadence with no LOBs or safety issues.    Stairs Stairs: Yes Stairs  assistance: Modified independent (Device/Increase time) Stair Management: One rail Right Number of Stairs: 4 General stair comments: Pt easily negotiated up/down steps with confidence and good safety, no needs.  Wheelchair Mobility    Modified Rankin (Stroke Patients Only)       Balance Overall balance assessment: Modified Independent                                           Pertinent Vitals/Pain Pain Assessment: 0-10 Pain Score: 6  Pain Location: neck and throat    Home Living Family/patient expects to be discharged to:: Private residence Living Arrangements: Other relatives(brother) Available Help at Discharge: Family(near 24/7 assist with brother, mother, girlfiend)   Home Access: Stairs to enter Entrance Stairs-Rails: Right Entrance Stairs-Number of Steps: 6   Home Equipment: None      Prior Function Level of Independence: Independent         Comments: 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, works in Marketing executivemaintainance, 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     Hand Dominance        Extremity/Trunk Assessment   Upper Extremity Assessment Upper Extremity Assessment: Overall WFL for tasks assessed(expected soreness/hesitancy post-op but [facility])    Lower Extremity Assessment Lower Extremity Assessment: Overall WFL for tasks assessed       Communication   Communication: No difficulties  Cognition Arousal/Alertness: Awake/alert Behavior During Therapy: WFL for tasks assessed/performed Overall Cognitive Status: Within Functional Limits for tasks assessed  General Comments      Exercises     Assessment/Plan    PT Assessment Patient needs continued PT services  PT Problem List Decreased strength;Decreased range of motion;Decreased balance;Decreased activity tolerance;Decreased mobility;Decreased coordination;Decreased cognition;Decreased knowledge of use of DME;Decreased safety awareness;Pain       PT Treatment Interventions  Gait training;Stair training;Functional mobility training;Therapeutic activities;Therapeutic exercise;Patient/family education;Neuromuscular re-education;Balance training    PT Goals (Current goals can be found in the Care Plan section)  Acute Rehab PT Goals Patient Stated Goal: Go home PT Goal Formulation: With patient Time For Goal Achievement: 03/02/19 Potential to Achieve Goals: Good    Frequency 7X/week   Barriers to discharge        Co-evaluation               AM-PAC PT "6 Clicks" Mobility  Outcome Measure Help needed turning from your back to your side while in a flat bed without using bedrails?: None Help needed moving from lying on your back to sitting on the side of a flat bed without using bedrails?: None Help needed moving to and from a bed to a chair (including a wheelchair)?: None Help needed standing up from a chair using your arms (e.g., wheelchair or bedside chair)?: None Help needed to walk in hospital room?: None Help needed climbing 3-5 steps with a railing? : None 6 Click Score: 24    End of Session Equipment Utilized During Treatment: Gait belt;Cervical collar Activity Tolerance: Patient tolerated treatment well Patient left: with call bell/phone within reach;with bed alarm set Nurse Communication: Mobility status PT Visit Diagnosis: Muscle weakness (generalized) (M62.81);Pain;Other symptoms and signs involving the nervous system (R29.898) Pain - Right/Left: Left Pain - part of body: (cervicalgia)    Time: 0814-4818 PT Time Calculation (min) (ACUTE ONLY): 18 min   Charges:   PT Evaluation $PT Eval Low Complexity: 1 Low          Kreg Shropshire, DPT 02/16/2019, 10:52 AM

## 2019-02-16 NOTE — Discharge Instructions (Signed)

## 2019-02-16 NOTE — Progress Notes (Signed)
Procedure: C3-C7 ACDF Procedure date: 02/15/2019 Diagnosis: Cervical myelopathy  History: Lakendrick Paradis Nepomuceno is s/p C3-C7 ACDF for cervical myelopathy  POD1: Doing well.  States he is up to the bathroom.  Swallowing well.  Has expected neck pain.  Arms feel better, and has improved balance.  POD0: Tolerated procedure well without complication.  Seen in postop recovery still disoriented from anesthesia but able to answer questions and obey commands.  Complains of anterior and posterior neck soreness.  Denies any upper extremity pain/numbness, but he does complain of tingling throughout his upper extremities.  States they were numb prior to surgery.  Denies any lower extremity pain/numbness/tingling.  Physical Exam: Vitals:   02/16/19 0038 02/16/19 0447  BP: 122/72 109/68  Pulse: 72 72  Resp: 15 14  Temp: 97.6 F (36.4 C) 97.8 F (36.6 C)  SpO2: 94% 93%   Strength:5/5 throughout upper and lower extremities Sensation: Intact and symmetric throughout upper and lower extremities Skin: Glue intact at incision site.  No active bleeding at drain insertion site.  Data:  Recent Labs  Lab 02/10/19 0941  NA 141  K 4.9  CL 106  CO2 28  BUN 23*  CREATININE 0.94  GLUCOSE 92  CALCIUM 9.0   No results for input(s): AST, ALT, ALKPHOS in the last 168 hours.  Invalid input(s): TBILI   Recent Labs  Lab 02/10/19 0941  WBC 5.7  HGB 15.0  HCT 44.8  PLT 191   Recent Labs  Lab 02/10/19 0941  APTT 27  INR 0.9         Other tests/results: Cervical x-rays pending  Drain output 75, 15 overnight  Assessment/Plan:  Artyom A Dorvil POD 1 status post C3-C7 ACDF for cervical myelopathy.  He is doing well.  - monitor drain output. D/c drain if less than 30 ml by 1 pm - mobilize - pain control - DVT prophylaxis - Brace - DC Foley catheter POD 1 - Advance diet  [facility] Department of Neurosurgery

## 2019-02-22 NOTE — Addendum Note (Signed)
Addendum  created 02/22/19 0936 by Doreen Salvage, CRNA   Charge Capture section accepted

## 2019-04-02 ENCOUNTER — Other Ambulatory Visit: Payer: Self-pay | Admitting: Family Medicine

## 2019-04-02 DIAGNOSIS — J449 Chronic obstructive pulmonary disease, unspecified: Secondary | ICD-10-CM

## 2019-04-03 NOTE — Telephone Encounter (Signed)
Requested medication (s) are due for refill today: yes  Requested medication (s) are on the active medication list: yes  Last refill:  01/02/2019  Future visit scheduled:no  Notes to clinic:  Review for refill   Requested Prescriptions  Pending Prescriptions Disp Refills   WIXELA INHUB 250-50 MCG/DOSE AEPB [Pharmacy Med Name: WIXELA 250-50 INHUB] 180 each 3    Sig: TAKE 1 PUFF BY MOUTH TWICE A DAY     There is no refill protocol information for this order

## 2019-09-27 ENCOUNTER — Ambulatory Visit: Payer: Self-pay | Admitting: *Deleted

## 2019-09-27 NOTE — Telephone Encounter (Signed)
Pt called in c/o being dizzy, tired, having muscle aches and nausea for the last 2 days.   He does apartment maintenance.   They sent him home from his job yesterday.   He is also coughing.   He smokes and has COPD but he is feeling more winded than his normal and coughing more.  He has also lost his taste.  I let him know he needs to be tested for COVID-19 and to isolate himself from others.   I instructed him to wear a mask when he went to get medical treatment.  I attempted to get him an appt at [facility] however they are booked today.   I suggested he go to the [facility].   He is agreeable to this.  His brother is there with him.   I let him know he needs to quarantine until [facility] test result comes back since he has been exposed  To [place] and his symptoms are suggestive of COVID-19.  He is headed to the [facility] now.   Reason for Disposition . [1] MODERATE dizziness (e.g., interferes with normal activities) AND [2] has NOT been evaluated by physician for this  (Exception: dizziness caused by heat exposure, sudden standing, or poor fluid intake)  Answer Assessment - Initial Assessment Questions 1. DESCRIPTION: "Describe your dizziness."     Last couple of days I nauseated, aching, tired.   Yesterday I went to work and I had cold chills.   My sugars were high and my hands were shaking.   Not diabetic.    Yesterday the dizziness started.   Not been exposed to COVID-19 as far as he knows.   He does apartment maintenance.   2. LIGHTHEADED: "Do you feel lightheaded?" (e.g., somewhat faint, woozy, weak upon standing)     I feel like I could pass out yesterday. 3. VERTIGO: "Do you feel like either you or the room is spinning or tilting?" (i.e. vertigo)     No 4. SEVERITY: "How bad is it?"  "Do you feel like you are going to faint?" "Can you stand and walk?"   - MILD - walking normally   - MODERATE - interferes with normal activities  (e.g., work, school)    - SEVERE - unable to stand, requires support to walk, feels like passing out now.      Moderate   Yesterday I had to lean against the wall to get from falling. 5. ONSET:  "When did the dizziness begin?"     2 days maybe 3 days. 6. AGGRAVATING FACTORS: "Does anything make it worse?" (e.g., standing, change in head position)     It's a little worse this morning when I get up from sitting or out of bed. 7. HEART RATE: "Can you tell me your heart rate?" "How many beats in 15 seconds?"  (Note: not all patients can do this)       Not asked.   No cardiac history. 8. CAUSE: "What do you think is causing the dizziness?"     I don't know 9. RECURRENT SYMPTOM: "Have you had dizziness before?" If so, ask: "When was the last time?" "What happened that time?"     Last August I had throat surgery.   I was dizzy prior to the throat surgery.   I had 3 herniated discs in my neck they repaired. 10. OTHER SYMPTOMS: "Do you have any other symptoms?" (e.g., fever, chest pain, vomiting, diarrhea, bleeding)  No diarrhea, having nausea.  I smoke and have COPD.   I'm getting winded easier than normal. 11. PREGNANCY: "Is there any chance you are pregnant?" "When was your last menstrual period?"       N/A  Protocols used: DIZZINESS [facility]

## 2020-02-01 NOTE — Progress Notes (Signed)
Name: Peter [name]   MRN: [id number]    DOB: August 24, 1965   Date:02/02/2020       Progress Note  Subjective  Chief Complaint  Chief Complaint  Patient presents with  . Annual Exam    would like a referral for  Arthritis    HPI  Patient presents for annual CPE   Patient is a 54 year old male Last visit at [facility] was in March 2020 Follows up today for a physical.  His main complaint today is increasing arthritis symptoms.  He notes he has body aches that involve his shoulders and arms, and mostly his hands bilaterally.  They get swollen, achy.  He is also feeling in his knees and his feet, he has a strong family history of arthritis issues as well.  He stated today he very much wants to see somebody from rheumatology to help.  He denies having further work-up in the past.  It is affecting his work presently to some extent.  He states he is just working through the pain.  Also notes he is more fatigued recently.  With his GI bleed history, NSAIDs are not recommended options for management.  Can use Tylenol type products.  Has a history of a GI bleed in the past- he used to take large doses of BC powders, 6-8 of those in an 8 hour shift. Now just taking extra strength tylenol;  Was told to avoid aspirin products in the future .  Denies any recent bleeding concerns including no black or dark stools, no bleeding per rectum.  No nausea or vomiting.  No marked reflux symptoms of concern.  Ventral hernia without obstruction or gangrene history -at site of ileostomy takedown, have been offered to return to surgeon in the past and he declined, no recent concerns  In August 2020, he had a anterior cervical discectomy and fusion C3-7 for spinal stenosis concerns.  He was released back to work without restrictions in November 2020.  Still notes he has an occasional numbness down the right upper extremity, and did talk to the surgeon about that after, and has not increased significantly.  He had a  couple visits to [facility] urgent care in March and May 2021, the first for a COPD exacerbation, the second for a herpetic infection.  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, no reg exercise  Depression: phq 9 reviewed, no major depression concerns, decrease energy  Depression screen [facility] 2/9 02/02/2020 09/08/2018 05/24/2018 04/26/2018  Decreased Interest 3 2 0 0  Down, Depressed, Hopeless 0 0 0 1  PHQ - 2 Score 3 2 0 1  Altered sleeping 0 0 0 0  Tired, decreased energy 0 3 0 1  Change in appetite 0 0 0 0  Feeling bad or failure about yourself  0 0 0 0  Trouble concentrating 0 0 0 0  Moving slowly or fidgety/restless 0 0 0 0  Suicidal thoughts 0 0 0 0  PHQ-9 Score 3 5 0 2  Difficult doing work/chores - Somewhat difficult Not difficult at all -    Hypertension:  BP Readings from Last 3 Encounters:  02/02/20 (!) 142/82  02/16/19 128/79  02/10/19 119/82  Obesity: Wt Readings from Last 3 Encounters:  02/02/20 (!) 207 lb 9.6 oz (94.2 kg)  02/15/19 211 lb 10.3 oz (96 kg)  02/10/19 210 lb 8.6 oz (95.5 kg)   BMI Readings from Last 3 Encounters:  02/02/20 31.57 kg/m  02/15/19 31.25 kg/m  02/10/19 31.09 kg/m     Lipids:  Lab Results  Component Value Date   CHOL 156 09/08/2018   CHOL 159 04/26/2018   Lab Results  Component Value Date   HDL 40 09/08/2018   HDL 36 (L) 04/26/2018   Lab Results  Component Value Date   LDLCALC 96 09/08/2018   LDLCALC 100 (H) 04/26/2018   Lab Results  Component Value Date   TRIG 103 09/08/2018   TRIG 126 04/26/2018   Lab Results  Component Value Date   CHOLHDL 3.9 09/08/2018   CHOLHDL 4.4 04/26/2018   No results found for: LDLDIRECT  Glucose:  Glucose  Date Value Ref  Range Status  09/10/2011 90 65 - 99 mg/dL Final  68/05/5725 95 65 - 99 mg/dL Final   Glucose, Bld  Date Value Ref Range Status  02/10/2019 92 70 - 99 mg/dL Final  20/35/5974 84 70 - 99 mg/dL Final  16/38/4536 83 65 - 139 mg/dL Final    Comment:    .        Non-fasting reference interval .       Office Visit from 09/08/2018 in [facility]  AUDIT-C Score 0      Alcohol use-denies use tobacco use: 1 ppd, 40 years  STD testing and prevention (chl/gon/syphilis): same partner past 30 years, not interested Hepatitis C antibody-declined previously  Skin cancer: no concerning skin lesions noted by the patient, just notes occasional skin dryness Lung cancer:  Smoking 1 ppd; not really ready to quit; Chest CT for screening now starting at age 62 and eligible.  He desires to pursue AAA -screening can start at age 29 for patient has never smoked. EKG-no concerning symptoms, denies chest pain, palpitations, mild shortness of breath working out in the heat, increased lower extremity swelling  Prostate cancer:  Father had, uncle died from this Up once or twice a night to urinate, Denies marked hesitancy, urgency, frequency, no hematuria Has not had a PSA previously to follow.  Discussed the role of PSAs with prostate cancer screening. No results found for: PSA1, PSA  Colorectal cancer: Denies family or personal history of colorectal cancer,  no changes in BM's - no blood in stool, dark and tarry stool, or constipation/diarrhea. Last colonoscopy done in 2017; polyps, next in 2022  He has no living will nor medical power of attorney.  Patient Active Problem List   Diagnosis Date Noted  . S/P cervical spinal fusion 02/15/2019  . Preventative health care 09/08/2018  . Ventral hernia without obstruction or gangrene 09/08/2018  . History of colonoscopy with polypectomy 09/08/2018  . Hx of lower gastrointestinal bleeding 09/08/2018  . Obesity (BMI 30.0-34.9) 04/26/2018   . COPD (chronic obstructive pulmonary disease) (HCC) 04/26/2018  . Arthritis of right hand 04/26/2018  . Arthritis of left hand 04/26/2018  . Degenerative disc disease, lumbar 04/26/2018  . Knee pain, bilateral 04/26/2018  . Dry skin 04/26/2018  . Tobacco use disorder 08/20/2015  . Cervical radiculopathy 06/22/2015  . Displacement of intervertebral disc at C6-C7 level 06/22/2015  . Right shoulder pain 06/22/2015    Past Surgical History:  Procedure Laterality Date  . ANTERIOR CERVICAL DECOMPRESSION/DISCECTOMY FUSION 4 LEVELS N/A 02/15/2019  Procedure: ANTERIOR CERVICAL DECOMPRESSION/DISCECTOMY FUSION 4 LEVELS C3-7;  Surgeon: Venetia Night, MD;  Location: [facility] ORS;  Service: Neurosurgery;  Laterality: N/A;  . APPENDECTOMY  1988  . BLADDER REPAIR N/A 09/11/2015   Procedure: BLADDER REPAIR;  Surgeon: Hildred Laser, MD;  Location: [facility] ORS;  Service: Urology;  Laterality: N/A;  . CHOLECYSTECTOMY  1989  . COLON SURGERY     for diverticulitis  . COLONOSCOPY WITH PROPOFOL N/A 08/16/2015   Procedure: COLONOSCOPY WITH PROPOFOL;  Surgeon: Midge Minium, MD;  Location: [facility];  Service: Endoscopy;  Laterality: N/A;  . CYSTOSCOPY WITH STENT PLACEMENT Bilateral 09/11/2015   Procedure: CYSTOSCOPY WITH STENT PLACEMENT;  Surgeon: Hildred Laser, MD;  Location: [facility] OR[facility];  Service: Urology;  Laterality: Bilateral;  . ILEOSTOMY CLOSURE N/A 11/19/2015   Procedure: ILEOSTOMY TAKEDOWN;  Surgeon: Gladis Riffle, MD;  Location: [facility] ORS;  Service: General;  Laterality: N/A;  . LAPAROSCOPIC PARTIAL COLECTOMY N/A 09/11/2015   Procedure: LAPAROSCOPIC PARTIAL COLECTOMY Possible Open, possible ostomy, repair of colovesical fistula Cystoscopy and stent placement, repair of colovesical fistula with Dr. Sherryl Barters as well ;  Surgeon: Gladis Riffle, MD;  Location: [facility] OR[facility];  Service: General;  Laterality: N/A;  . POLYPECTOMY  08/16/2015   Procedure: POLYPECTOMY;  Surgeon: Midge Minium, MD;   Location: [facility];  Service: Endoscopy;;  . TONSILLECTOMY  1970    Family History  Problem Relation Age of Onset  . Cholecystitis Mother   . Lung cancer Father   . Emphysema Paternal Grandmother   . Diabetes Paternal Grandmother   . Prostate cancer Maternal Uncle   . Diabetes Brother   . Hypertension Brother     Social History   Socioeconomic History  . Marital status: Single    Spouse name: Not on file  . Number of children: Not on file  . Years of education: Not on file  . Highest education level: GED or equivalent  Occupational History  . Not on file  Tobacco Use  . Smoking status: Current Every Day Smoker    Packs/day: 1.00    Years: 30.00    Pack years: 30.00    Types: Cigarettes  . Smokeless tobacco: Never Used  Vaping Use  . Vaping Use: Never used  Substance and Sexual Activity  . Alcohol use: No    Alcohol/week: 0.0 standard drinks  . Drug use: No  . Sexual activity: Yes    Birth control/protection: Sponge  Other Topics Concern  . Not on file  Social History Narrative  . Not on file   Social Determinants of Health   Financial Resource Strain:   . Difficulty of Paying Living Expenses:   Food Insecurity:   . Worried About Programme researcher, broadcasting/film/video in the Last Year:   . Barista in the Last Year:   Transportation Needs:   . Freight forwarder (Medical):   Marland Kitchen Lack of Transportation (Non-Medical):   Physical Activity:   . Days of Exercise per Week:   . Minutes of Exercise per Session:   Stress:   . Feeling of Stress :   Social Connections:   . Frequency of Communication with Friends and Family:   . Frequency of Social Gatherings with Friends and Family:   . Attends Religious Services:   . Active Member of Clubs or Organizations:   . Attends Banker Meetings:   Marland Kitchen Marital Status:   Intimate Partner Violence:   . Fear of Current or Ex-Partner:   .  Emotionally Abused:   Marland Kitchen. Physically Abused:   . Sexually Abused:       Current Outpatient Medications:  .  Fluticasone-Salmeterol (ADVAIR) 100-50 MCG/DOSE AEPB, Inhale 1 puff into the lungs 2 (two) times daily., Disp: , Rfl:  .  omeprazole (PRILOSEC OTC) 20 MG tablet, Take 20 mg by mouth every morning., Disp: , Rfl:   No Known Allergies   ROS: As noted above in HPI denies any recent unintentional weight loss, + increased fatigue No recent fevers or other Covid concerning sx's No increase headaches, vision changes No CP, palpitations,  Notes he has mild shortness of breath, likely related to his smoking history  No persistent abdominal pains or change in bowel habits,  No rectal bleeding or dark/black stools,  No flank pains or dysuria,  No frequency or urgency No lower extremity swelling,  Very positive for increased joint aches, some muscle aches,  Still with intermittent right upper extremity numbness/tingling No history of hypertension. Denies other specific complaints on systems review except as noted in HPI    Objective  Vitals:   02/02/20 1323  BP: (!) 142/82  Pulse: 86  Resp: 16  Temp: 98.5 F (36.9 C)  TempSrc: Temporal  SpO2: 99%  Weight: (!) 207 lb 9.6 oz (94.2 kg)  Height: 5\' 8"  (1.727 m)    Body mass index is 31.57 kg/m. Blood pressure borderline systolic noted today  NAD, masked,  HEENT - sclera anicteric, PERRL, positive glasses, EOMI, conj - non-inj'ed, pharynx clear Neck - supple, no adenopathy, no TM, no JVD, carotids 2+ and = without bruits bilat Car - RRR without m/g/r Pulm- CTA without wheeze or rales, mildly distant breath sounds Abd - soft, NT, mildly obese, ND, BS+, protuberance in the right lower quadrant at the site of the incision with the large hernia noted, nontender to palpate this area, and notes no significant change recent past,no obvious HSM,  Back - no CVA tenderness,  Skin- no new lesions of concern on exposed areas, denied otherwise Ext - no LE edema, positive dryness noted in the upper  extremities distally in hands with no increased warmth or increased erythema of the hand joints, some chronic deformity changes noted of some finger joints question some minimal swelling of some finger joints, not marked.  Had good wrist motion and elbow motion. GU/rectal -offered and he declined and deferred today as no recent concerning symptoms, rectally exams for prostate checks not routinely done for prostate cancer screening presently and discussed recommendations for prostate CA screening including PSA tests, and he would prefer to pursue the blood test presently. Neuro - affect was not flat, appropriate with conversation  Grossly non-focal with good strength on testing, sensation intact to LT in distal extremities, Romberg neg, no pronator drift, good balance on one foot, good finger to nose, good tandem walk, gait normal   No results found for this or any previous visit (from the past 2160 hour(s)).   PHQ2/9: Depression screen [facility] 2/9 02/02/2020 09/08/2018 05/24/2018 04/26/2018  Decreased Interest 3 2 0 0  Down, Depressed, Hopeless 0 0 0 1  PHQ - 2 Score 3 2 0 1  Altered sleeping 0 0 0 0  Tired, decreased energy 0 3 0 1  Change in appetite 0 0 0 0  Feeling bad or failure about yourself  0 0 0 0  Trouble concentrating 0 0 0 0  Moving slowly or fidgety/restless 0 0 0 0  Suicidal thoughts 0 0 0 0  PHQ-9  Score 3 5 0 2  Difficult doing work/chores - Somewhat difficult Not difficult at all -     Fall Risk: Fall Risk  02/02/2020 09/08/2018 05/24/2018 04/26/2018  Falls in the past year? 0 0 0 No  Number falls in past yr: 0 0 0 -  Injury with Fall? 0 0 - -      Functional Status Survey: Is the patient deaf or have difficulty hearing?: No Does the patient have difficulty seeing, even when wearing glasses/contacts?: No Does the patient have difficulty concentrating, remembering, or making decisions?: No Does the patient have difficulty walking or climbing stairs?: No Does the patient  have difficulty dressing or bathing?: No Does the patient have difficulty doing errands alone such as visiting a doctor's office or shopping?: No    Assessment & Plan  1. General Health/Health Maintenance:   -Prostate cancer screening and PSA options (with potential risks and benefits of testing vs not testing) were discussed along with recent recs/guidelines.  -USPSTF grade A and B recommendations reviewed with patient; age-appropriate recommendations, preventive care, screening tests, etc discussed and encouraged; healthy living encouraged; see AVS for any further patient education given to patient  -Discussed importance of regular physical activity weekly,   -Discussed importance of eating healthy diet:  eating lean meats and proteins, avoiding trans fats and saturated fats, avoid simple sugars and excessive carbs in diet, eating servings of fruit/vegetables daily and staying hydrated including drink plenty of water and avoid sweet beverages.   -Reviewed Health Maintenance:  Adult vaccines due  Topic Date Due  . TETANUS/TDAP  04/26/2028   1. Tobacco dependence Noted still not ready to quit.  Strongly encouraged He did want to pursue the CT scans for lung cancer screening and ordered. - CT CHEST LUNG CA SCREEN LOW DOSE W/O CM; Future  2. Chronic obstructive pulmonary disease, unspecified COPD type (HCC) Has some mild dyspnea which has not increased significantly in the recent past.  Do feel likely has a COPD component with his tobacco use history noted. Continue to monitor presently, and if more symptomatic needs to follow-up. - COMPLETE METABOLIC PANEL WITH GFR  3. Ventral hernia without obstruction or gangrene No significant changes in the recent past per history, aware if has severe pains, concerning changes the need to be seen immediately  4. Obesity (BMI 30.0-34.9) Weight has been fairly stable, and discussed the importance of healthy weight maintenance over time. - Lipid  panel  5. Hx of lower gastrointestinal bleeding No recent symptoms or signs of concern We will recheck labs. Need to avoid NSAIDs with his history, and uses extra strength Tylenol as needed for his arthritic symptoms. - CBC with Differential/Platelet  6. History of colonoscopy with polypectomy Due for colonoscopy in 2022.  7. S/P cervical spinal fusion Has been stable after this procedure, although still with some occasional right upper extremity numbness/tingling.  8. Arthritis Is concerned with increased symptoms, especially involving the hands, and will check some labs today and also referred to rheumatology as he very much wanted to see the rheumatologist to further help. He noted a strong family history of arthritis issues. - Ambulatory referral to Rheumatology - COMPLETE METABOLIC PANEL WITH GFR - TSH - ANA,IFA RA Diag Pnl w/rflx Tit/Patn - C-reactive protein  9. Chronic fatigue We will check some labs today May be related to some of the increased arthritis and arthralgia symptoms. - COMPLETE METABOLIC PANEL WITH GFR - TSH - CBC with Differential/Platelet - C-reactive protein  10. Screening  for prostate cancer We will check a PSA today. - PSA   Await lab results, and did refer to rheumatology as he very much wanted to see them for further input. Continue with the extra strength Tylenol product, continue avoiding NSAIDs with his history Tentatively, schedule a follow-up in 6 months time, although note noted may be sooner pending lab results, and follow-up sooner as needed.  Pincus Badder, MD

## 2020-02-02 ENCOUNTER — Ambulatory Visit (INDEPENDENT_AMBULATORY_CARE_PROVIDER_SITE_OTHER): Payer: Managed Care, Other (non HMO) | Admitting: Internal Medicine

## 2020-02-02 ENCOUNTER — Other Ambulatory Visit: Payer: Self-pay

## 2020-02-02 ENCOUNTER — Encounter: Payer: Self-pay | Admitting: Internal Medicine

## 2020-02-02 VITALS — BP 142/82 | HR 86 | Temp 98.5°F | Resp 16 | Ht 68.0 in | Wt 207.6 lb

## 2020-02-02 DIAGNOSIS — J449 Chronic obstructive pulmonary disease, unspecified: Secondary | ICD-10-CM

## 2020-02-02 DIAGNOSIS — F17200 Nicotine dependence, unspecified, uncomplicated: Secondary | ICD-10-CM | POA: Diagnosis not present

## 2020-02-02 DIAGNOSIS — Z8719 Personal history of other diseases of the digestive system: Secondary | ICD-10-CM

## 2020-02-02 DIAGNOSIS — K439 Ventral hernia without obstruction or gangrene: Secondary | ICD-10-CM | POA: Diagnosis not present

## 2020-02-02 DIAGNOSIS — Z981 Arthrodesis status: Secondary | ICD-10-CM

## 2020-02-02 DIAGNOSIS — Z9889 Other specified postprocedural states: Secondary | ICD-10-CM

## 2020-02-02 DIAGNOSIS — M1990 Unspecified osteoarthritis, unspecified site: Secondary | ICD-10-CM

## 2020-02-02 DIAGNOSIS — E669 Obesity, unspecified: Secondary | ICD-10-CM

## 2020-02-02 DIAGNOSIS — R5382 Chronic fatigue, unspecified: Secondary | ICD-10-CM

## 2020-02-02 DIAGNOSIS — Z125 Encounter for screening for malignant neoplasm of prostate: Secondary | ICD-10-CM

## 2020-02-02 DIAGNOSIS — Z86010 Personal history of colonic polyps: Secondary | ICD-10-CM

## 2020-02-06 LAB — CBC WITH DIFFERENTIAL/PLATELET
Absolute Monocytes: 377 cells/uL (ref 200–950)
Basophils Absolute: 51 cells/uL (ref 0–200)
Basophils Relative: 1 %
Eosinophils Absolute: 117 cells/uL (ref 15–500)
Eosinophils Relative: 2.3 %
HCT: 45.9 % (ref 38.5–50.0)
Hemoglobin: 15.2 g/dL (ref 13.2–17.1)
Lymphs Abs: 1969 cells/uL (ref 850–3900)
MCH: 31.9 pg (ref 27.0–33.0)
MCHC: 33.1 g/dL (ref 32.0–36.0)
MCV: 96.4 fL (ref 80.0–100.0)
MPV: 9.9 fL (ref 7.5–12.5)
Monocytes Relative: 7.4 %
Neutro Abs: 2586 cells/uL (ref 1500–7800)
Neutrophils Relative %: 50.7 %
Platelets: 215 10*3/uL (ref 140–400)
RBC: 4.76 10*6/uL (ref 4.20–5.80)
RDW: 13.1 % (ref 11.0–15.0)
Total Lymphocyte: 38.6 %
WBC: 5.1 10*3/uL (ref 3.8–10.8)

## 2020-02-06 LAB — PSA: PSA: 0.5 ng/mL (ref <=4.0)

## 2020-02-06 LAB — TSH: TSH: 1.07 mIU/L (ref 0.40–4.50)

## 2020-02-06 LAB — COMPLETE METABOLIC PANEL WITH GFR
AG Ratio: 1.7 (calc) (ref 1.0–2.5)
ALT: 15 U/L (ref 9–46)
AST: 23 U/L (ref 10–35)
Albumin: 4.6 g/dL (ref 3.6–5.1)
Alkaline phosphatase (APISO): 47 U/L (ref 35–144)
BUN: 20 mg/dL (ref 7–25)
CO2: 27 mmol/L (ref 20–32)
Calcium: 9.5 mg/dL (ref 8.6–10.3)
Chloride: 105 mmol/L (ref 98–110)
Creat: 1.06 mg/dL (ref 0.70–1.33)
GFR, Est African American: 92 mL/min/{1.73_m2} (ref >=60)
GFR, Est Non African American: 80 mL/min/{1.73_m2} (ref >=60)
Globulin: 2.7 g/dL (calc) (ref 1.9–3.7)
Glucose, Bld: 77 mg/dL (ref 65–99)
Potassium: 4.7 mmol/L (ref 3.5–5.3)
Sodium: 142 mmol/L (ref 135–146)
Total Bilirubin: 0.3 mg/dL (ref 0.2–1.2)
Total Protein: 7.3 g/dL (ref 6.1–8.1)

## 2020-02-06 LAB — LIPID PANEL
Cholesterol: 174 mg/dL (ref <200)
HDL: 46 mg/dL (ref >=40)
LDL Cholesterol (Calc): 114 mg/dL (calc) — ABNORMAL HIGH
Non-HDL Cholesterol (Calc): 128 mg/dL (calc) (ref <130)
Total CHOL/HDL Ratio: 3.8 (calc) (ref <5.0)
Triglycerides: 53 mg/dL (ref <150)

## 2020-02-06 LAB — ANA,IFA RA DIAG PNL W/RFLX TIT/PATN
Anti Nuclear Antibody (ANA): NEGATIVE
Cyclic Citrullin Peptide Ab: <16 UNITS
Rheumatoid fact SerPl-aCnc: 17 IU/mL — ABNORMAL HIGH (ref <14)

## 2020-02-06 LAB — C-REACTIVE PROTEIN: CRP: 1.3 mg/L (ref <8.0)

## 2020-02-19 ENCOUNTER — Telehealth: Payer: Self-pay

## 2020-02-19 NOTE — Telephone Encounter (Signed)
Received referral for initial lung cancer screening scan from Dr. Dorris Fetch. Contacted patient and explained program.  Patient was receptive to program but was not at a place to be able to look at his calendar to schedule scan or answer medical questions.  He took phone number to Peter [name], lung navigator and will call Peter [name] when he is able to answer questions and schedule scan. He thanked our program for giving him a call.

## 2020-02-22 ENCOUNTER — Telehealth: Payer: Self-pay | Admitting: *Deleted

## 2020-02-22 DIAGNOSIS — Z87891 Personal history of nicotine dependence: Secondary | ICD-10-CM

## 2020-02-22 DIAGNOSIS — Z122 Encounter for screening for malignant neoplasm of respiratory organs: Secondary | ICD-10-CM

## 2020-02-22 NOTE — Telephone Encounter (Signed)
Received referral for low dose lung cancer screening CT scan. Message left at phone number listed in EMR for patient to call me back to facilitate scheduling scan.  

## 2020-02-22 NOTE — Addendum Note (Signed)
Addended by: Jonne Ply on: 02/22/2020 03:53 PM   Modules accepted: Orders

## 2020-02-22 NOTE — Telephone Encounter (Signed)
Received referral for initial lung cancer screening scan. Contacted patient and obtained smoking history,(current, 41 pack year) as well as answering questions related to screening process. Patient denies signs of lung cancer such as weight loss or hemoptysis. Patient denies comorbidity that would prevent curative treatment if lung cancer were found. Patient is scheduled for shared decision making visit and CT scan on 02/28/20 at 1030am.

## 2020-02-28 ENCOUNTER — Inpatient Hospital Stay: Payer: Managed Care, Other (non HMO) | Admitting: Oncology

## 2020-02-28 ENCOUNTER — Ambulatory Visit: Payer: Managed Care, Other (non HMO)

## 2020-02-28 ENCOUNTER — Telehealth: Payer: Self-pay | Admitting: *Deleted

## 2020-02-28 NOTE — Telephone Encounter (Signed)
Contacted patient and explained that we do not have authorization from insurance to do lung screening. Our business office is working to clarify the coverage and authorization. Patient is in agreement with not doing scan today and waiting on authorization.

## 2020-02-29 ENCOUNTER — Telehealth: Payer: Self-pay | Admitting: *Deleted

## 2020-02-29 NOTE — Telephone Encounter (Signed)
Notified that we have auth for lung screening. Patient notified and scheduled for [facility] and CT .

## 2020-03-13 ENCOUNTER — Ambulatory Visit
Admission: RE | Admit: 2020-03-13 | Discharge: 2020-03-13 | Disposition: A | Discharge status: Home / Self Care | Payer: Managed Care, Other (non HMO) | Source: Ambulatory Visit | Attending: Oncology | Admitting: Oncology

## 2020-03-13 ENCOUNTER — Inpatient Hospital Stay: Payer: Managed Care, Other (non HMO) | Attending: Oncology | Admitting: Oncology

## 2020-03-13 ENCOUNTER — Other Ambulatory Visit: Payer: Self-pay

## 2020-03-13 ENCOUNTER — Encounter: Payer: Self-pay | Admitting: Oncology

## 2020-03-13 DIAGNOSIS — Z122 Encounter for screening for malignant neoplasm of respiratory organs: Secondary | ICD-10-CM

## 2020-03-13 DIAGNOSIS — Z87891 Personal history of nicotine dependence: Secondary | ICD-10-CM

## 2020-03-13 NOTE — Progress Notes (Signed)
Virtual Visit via Video Note  I connected with Peter [name] on 03/13/20 at 10:15 AM EDT by a video enabled telemedicine application and verified that I am speaking with the correct person using two identifiers.  Location: Patient: [facility] Provider: Clinic    I discussed the limitations of evaluation and management by telemedicine and the availability of in person appointments. The patient expressed understanding and agreed to proceed.  I discussed the assessment and treatment plan with the patient. The patient was provided an opportunity to ask questions and all were answered. The patient agreed with the plan and demonstrated an understanding of the instructions.   The patient was advised to call back or seek an in-person evaluation if the symptoms worsen or if the condition fails to improve as anticipated.   In accordance with CMS guidelines, patient has met eligibility criteria including age, absence of signs or symptoms of lung cancer.  Social History   Tobacco Use  . Smoking status: Current Every Day Smoker    Packs/day: 1.00    Years: 41.00    Pack years: 41.00    Types: Cigarettes  . Smokeless tobacco: Never Used  Vaping Use  . Vaping Use: Never used  Substance Use Topics  . Alcohol use: No    Alcohol/week: 0.0 standard drinks  . Drug use: No      A shared decision-making session was conducted prior to the performance of CT scan. This includes one or more decision aids, includes benefits and harms of screening, follow-up diagnostic testing, over-diagnosis, false positive rate, and total radiation exposure.   Counseling on the importance of adherence to annual lung cancer LDCT screening, impact of co-morbidities, and ability or willingness to undergo diagnosis and treatment is imperative for compliance of the program.   Counseling on the importance of continued smoking cessation for former smokers; the importance of smoking cessation for current smokers, and information about  tobacco cessation interventions have been given to patient including [place] and [facility] quit [place] programs.   Written order for lung cancer screening with LDCT has been given to the patient and any and all questions have been answered to the best of my abilities.    Yearly follow up will be coordinated by Burgess Estelle, Thoracic Navigator.  I provided 15 minutes of face-to-face video visit time during this encounter, and > 50% was spent counseling as documented under my assessment & plan.   Jacquelin Hawking, NP

## 2020-03-19 ENCOUNTER — Encounter: Payer: Self-pay | Admitting: *Deleted

## 2020-04-01 ENCOUNTER — Emergency Department
Admission: EM | Admit: 2020-04-01 | Discharge: 2020-04-01 | Disposition: A | Discharge status: Home / Self Care | Payer: Managed Care, Other (non HMO) | Attending: Emergency Medicine | Admitting: Emergency Medicine

## 2020-04-01 ENCOUNTER — Other Ambulatory Visit: Payer: Self-pay

## 2020-04-01 ENCOUNTER — Encounter: Payer: Self-pay | Admitting: Emergency Medicine

## 2020-04-01 DIAGNOSIS — M71012 Abscess of bursa, left shoulder: Secondary | ICD-10-CM | POA: Diagnosis not present

## 2020-04-01 DIAGNOSIS — F17210 Nicotine dependence, cigarettes, uncomplicated: Secondary | ICD-10-CM | POA: Diagnosis not present

## 2020-04-01 DIAGNOSIS — J449 Chronic obstructive pulmonary disease, unspecified: Secondary | ICD-10-CM | POA: Diagnosis not present

## 2020-04-01 DIAGNOSIS — L03114 Cellulitis of left upper limb: Secondary | ICD-10-CM

## 2020-04-01 DIAGNOSIS — L02419 Cutaneous abscess of limb, unspecified: Secondary | ICD-10-CM

## 2020-04-01 DIAGNOSIS — R21 Rash and other nonspecific skin eruption: Secondary | ICD-10-CM | POA: Diagnosis present

## 2020-04-01 MED ORDER — CEFTRIAXONE SODIUM 1 G IJ SOLR
1.0000 g | Freq: Once | INTRAMUSCULAR | Status: AC
  Administered 2020-04-01: 1 g via INTRAMUSCULAR
  Filled 2020-04-01: qty 10

## 2020-04-01 MED ORDER — LIDOCAINE HCL (PF) 1 % IJ SOLN
5.0000 mL | Freq: Once | INTRAMUSCULAR | Status: AC
  Administered 2020-04-01: 5 mL
  Filled 2020-04-01: qty 5

## 2020-04-01 MED ORDER — CEPHALEXIN 500 MG PO CAPS: 500.0000 mg | ORAL_CAPSULE | Freq: Four times a day (QID) | ORAL | 0 refills | Status: AC

## 2020-04-01 MED ORDER — SULFAMETHOXAZOLE-TRIMETHOPRIM 800-160 MG PO TABS: 1.0000 | ORAL_TABLET | Freq: Two times a day (BID) | ORAL | 0 refills | Status: AC

## 2020-04-01 MED ORDER — KETOROLAC TROMETHAMINE 60 MG/2ML IM SOLN
60.0000 mg | Freq: Once | INTRAMUSCULAR | Status: AC
  Administered 2020-04-01: 60 mg via INTRAMUSCULAR
  Filled 2020-04-01: qty 2

## 2020-04-01 NOTE — Discharge Instructions (Signed)
Take the 2 antibiotics as prescribed.

## 2020-04-01 NOTE — ED Notes (Signed)
See triage note  Presents with rash to neck   Scabbed rash area noted to left side of neck  Describes rash as painful

## 2020-04-01 NOTE — ED Provider Notes (Signed)
[facility] Emergency Department Provider Note  ___________________________________________   First MD Initiated Contact with Patient 04/01/20 1016     (approximate)  I have reviewed the triage vital signs and the nursing notes.   HISTORY  Chief Complaint Rash  HPI Peter [name] is a 54 y.o. male Modena Jansky to the emergency department for evaluation of rash to the left side of his neck, shoulder and chest.  The patient states that this has been going on for about a week and a half and the rash is painful.  He was initially seen at [facility] on 9/17 and at the time thought that his pain was related to his chronic cervical issues.  However, the rash developed shortly after the pain and he was seen again on 9/20 and diagnosed with shingles.  He was placed on valacyclovir as well as a steroid and given pain medicine.  When he seen again on 9/23 he was complaining of continued pain and itching from the rash and received a small amount of additional pain medication.  The patient comes in today because his rash and pain are not improving.  He states that he feels that the rash is worse than it was upon presentation.  Last night, he had a subjective fever and was complaining of chills.  He denies headache, denies chest pain, abdominal pain.  He works as a Production designer, theatre/television/film person and feels that he cannot yet returned to work due to the pain.      Past Medical History:  Diagnosis Date  . Arthritis    neck, shoulders  . COPD (chronic obstructive pulmonary disease) (HCC)   . Diverticulitis   . Generalized headaches   . GERD (gastroesophageal reflux disease)    occ  . History of colonoscopy with polypectomy 09/08/2018   Aug 16, 2015  . Lower back pain   . Rectal pain   . Thrombosed hemorrhoids     Patient Active Problem List   Diagnosis Date Noted  . Chronic fatigue 02/02/2020  . S/P cervical spinal fusion 02/15/2019  . Preventative health care 09/08/2018  . Ventral  hernia without obstruction or gangrene 09/08/2018  . History of colonoscopy with polypectomy 09/08/2018  . Hx of lower gastrointestinal bleeding 09/08/2018  . Obesity (BMI 30.0-34.9) 04/26/2018  . COPD (chronic obstructive pulmonary disease) (HCC) 04/26/2018  . Arthritis of right hand 04/26/2018  . Arthritis of left hand 04/26/2018  . Degenerative disc disease, lumbar 04/26/2018  . Knee pain, bilateral 04/26/2018  . Dry skin 04/26/2018  . Tobacco dependence 08/20/2015  . Cervical radiculopathy 06/22/2015  . Displacement of intervertebral disc at C6-C7 level 06/22/2015  . Right shoulder pain 06/22/2015    Past Surgical History:  Procedure Laterality Date  . ANTERIOR CERVICAL DECOMPRESSION/DISCECTOMY FUSION 4 LEVELS N/A 02/15/2019   Procedure: ANTERIOR CERVICAL DECOMPRESSION/DISCECTOMY FUSION 4 LEVELS C3-7;  Surgeon: Venetia Night, MD;  Location: [facility];  Service: Neurosurgery;  Laterality: N/A;  . APPENDECTOMY  1988  . BLADDER REPAIR N/A 09/11/2015   Procedure: BLADDER REPAIR;  Surgeon: Hildred Laser, MD;  Location: [facility] OR[facility];  Service: Urology;  Laterality: N/A;  . CHOLECYSTECTOMY  1989  . COLON SURGERY     for diverticulitis  . COLONOSCOPY WITH PROPOFOL N/A 08/16/2015   Procedure: COLONOSCOPY WITH PROPOFOL;  Surgeon: Midge Minium, MD;  Location: [facility];  Service: Endoscopy;  Laterality: N/A;  . CYSTOSCOPY WITH STENT PLACEMENT Bilateral 09/11/2015   Procedure: CYSTOSCOPY WITH STENT PLACEMENT;  Surgeon: Cloyde Reams  Sherryl Barters, MD;  Location: [facility];  Service: Urology;  Laterality: Bilateral;  . ILEOSTOMY CLOSURE N/A 11/19/2015   Procedure: ILEOSTOMY TAKEDOWN;  Surgeon: Gladis Riffle, MD;  Location: [facility];  Service: General;  Laterality: N/A;  . LAPAROSCOPIC PARTIAL COLECTOMY N/A 09/11/2015   Procedure: LAPAROSCOPIC PARTIAL COLECTOMY Possible Open, possible ostomy, repair of colovesical fistula Cystoscopy and stent placement, repair of colovesical fistula with Dr.  Sherryl Barters as well ;  Surgeon: Gladis Riffle, MD;  Location: [facility];  Service: General;  Laterality: N/A;  . POLYPECTOMY  08/16/2015   Procedure: POLYPECTOMY;  Surgeon: Midge Minium, MD;  Location: [facility];  Service: Endoscopy;;  . TONSILLECTOMY  1970    Prior to Admission medications   Medication Sig Start Date End Date Taking? Authorizing Provider  cephALEXin (KEFLEX) 500 MG capsule Take 1 capsule (500 mg total) by mouth 4 (four) times daily for 10 days. 04/01/20 04/11/20  Lucy Chris, PA  Fluticasone-Salmeterol (ADVAIR) 100-50 MCG/DOSE AEPB Inhale 1 puff into the lungs 2 (two) times daily.    [provider]  omeprazole (PRILOSEC OTC) 20 MG tablet Take 20 mg by mouth every morning.    [provider]  sulfamethoxazole-trimethoprim (BACTRIM DS) 800-160 MG tablet Take 1 tablet by mouth 2 (two) times daily for 7 days. 04/01/20 04/08/20  Lucy Chris, PA    Allergies Patient has no known allergies.  Family History  Problem Relation Age of Onset  . Cholecystitis Mother   . Lung cancer Father   . Emphysema Paternal Grandmother   . Diabetes Paternal Grandmother   . Prostate cancer Maternal Uncle   . Diabetes Brother   . Hypertension Brother     Social History Social History   Tobacco Use  . Smoking status: Current Every Day Smoker    Packs/day: 1.00    Years: 41.00    Pack years: 41.00    Types: Cigarettes  . Smokeless tobacco: Never Used  Vaping Use  . Vaping Use: Never used  Substance Use Topics  . Alcohol use: No    Alcohol/week: 0.0 standard drinks  . Drug use: No    Review of Systems Constitutional: Subjective fever,+ chills Eyes: No visual changes. ENT: No sore throat. Cardiovascular: Denies chest pain. Respiratory: Denies shortness of breath. Gastrointestinal: No abdominal pain.  No nausea, no vomiting.  No diarrhea.  No constipation. Genitourinary: Negative for dysuria. Musculoskeletal: Negative for back pain. Skin:  Painful, draining rash to the left neck, shoulder and chest wall Neurological: Negative for headaches, focal weakness or numbness. ____________________________________________   PHYSICAL EXAM:  VITAL SIGNS: ED Triage Vitals  Enc Vitals Group     BP 04/01/20 0953 129/80     Pulse Rate 04/01/20 0953 (!) 101     Resp 04/01/20 0953 20     Temp 04/01/20 0953 98.6 F (37 C)     Temp Source 04/01/20 0953 Oral     SpO2 04/01/20 0953 96 %     Weight 04/01/20 0944 207 lb (93.9 kg)     Height 04/01/20 0944 5\' 10"  (1.778 m)     Head Circumference --      Peak Flow --      Pain Score 04/01/20 0944 8     Pain Loc --      Pain Edu? --      Excl. in GC? --     Constitutional: Alert and oriented. Well appearing and in no acute distress. Eyes: Conjunctivae are normal. PERRL. EOMI.  Head: Atraumatic. Neck: No stridor.   Cardiovascular: Normal rate, regular rhythm. Grossly normal heart sounds.  Respiratory: Normal respiratory effort.  No retractions.  Gastrointestinal: Soft and nontender. No distention.  Musculoskeletal: No lower extremity tenderness nor edema.  No joint effusions. Neurologic:  Normal speech and language. No gross focal neurologic deficits are appreciated. No gait instability. Skin: There is a large rash on the left lateral neck, shoulder and chest wall.  The rash consists of large groupings of pustular areas draining from an erythematous base.  Some of these are scabbed over while others are actively draining purulent material.  The rash does appear to cross at least 4 dermatomal distributions.  There are Psychiatric: Mood and affect are normal. Speech and behavior are normal.  ____________________________________________   INITIAL IMPRESSION / ASSESSMENT AND PLAN / ED COURSE  As part of my medical decision making, I reviewed the following data within the electronic MEDICAL RECORD NUMBER Nursing notes reviewed and incorporated, Old chart reviewed and Notes from prior ED visits         Peter [name] is a 54 year old male who presents to the emergency department for evaluation of rash.  This has previously been diagnosed as shingles but the patient feels that he is worsening despite treatment.  While the rash is in groupings, it is difficult to tell what it looked like previously.  It does cross multiple dermatomal distributions.  The patient does appear to have a superimposed infection to what may have caused the initial rash.  There are multiple sites of pustular drainage with surrounding erythema.  We will see the patient with IM Rocephin and placed on outpatient Bactrim and Keflex.  The patient is also concerned about how to treat his pain.  At this time I am uncomfortable continuing to give him narcotic pain medication, particularly with how quickly it has been used.  I offered the patient a dose of IM Toradol, which he accepted.  Recommended close follow-up with his primary care as well as possible follow-up with dermatology given the lack of improvement and superimposed infection.  The patient is understanding of this treatment plan and is amenable with outpatient therapy at this time.      ____________________________________________   FINAL CLINICAL IMPRESSION(S) / ED DIAGNOSES  Final diagnoses:  Cellulitis of left upper extremity  Abscess of shoulder     ED Discharge Orders         Ordered    sulfamethoxazole-trimethoprim (BACTRIM DS) 800-160 MG tablet  2 times daily        04/01/20 1138    cephALEXin (KEFLEX) 500 MG capsule  4 times daily        04/01/20 1138          *Please note:  Peter [name] was evaluated in Emergency Department on 04/01/2020 for the symptoms described in the history of present illness. He was evaluated in the context of the global COVID-19 pandemic, which necessitated consideration that the patient might be at risk for infection with the SARS-CoV-2 virus that causes COVID-19. Institutional protocols and algorithms that pertain to  the evaluation of patients at risk for COVID-19 are in a state of rapid change based on information released by regulatory bodies including the CDC and federal and state organizations. These policies and algorithms were followed during the patient's care in the ED.  Some ED evaluations and interventions may be delayed as a result of limited staffing during and the pandemic.*   Note:  This document was prepared  using Conservation officer, historic buildingsDragon voice recognition software and may include unintentional dictation errors.    Lucy ChrisRodgers, Jacquilyn Seldon J, PA 04/01/20 1502    Chesley NoonJessup, Charles, MD 04/02/20 [phone]

## 2020-04-01 NOTE — ED Triage Notes (Signed)
Pt reports rash to the left side of his neck since 9/16. Pt states rash is painful and he has been seen at [facility] 3 times over the last couple of weeks for the same. Rash looks scabbed over currently

## 2020-04-03 NOTE — Progress Notes (Signed)
Patient ID: Peter [name], male    DOB: Aug 01, 1965, 54 y.o.   MRN: [id number]  PCP: Jamelle Haring, MD  Chief Complaint  Patient presents with  . Hospitalization Follow-up  . Herpes Zoster    Ongoing since 9/15, recently started on keflex    Subjective:   Peter [name] is a 54 y.o. male, presents to clinic with CC of the following:  Chief Complaint  Patient presents with  . Hospitalization Follow-up  . Herpes Zoster    Ongoing since 9/15, recently started on keflex    HPI:  Patient is a 54 year old male I last visit was 02/02/2020 for an annual physical. Presents for follow-up after seen in the emergency room on 04/01/2020  He presented with the following complaint:  Peter [name] is a 54 y.o. male Modena Jansky to the emergency department for evaluation of rash to the left side of his neck, shoulder and chest.  The patient states that this has been going on for about a week and a half and the rash is painful.  He was initially seen at [facility] clinic on 9/17 and at the time thought that his pain was related to his chronic cervical issues.  However, the rash developed shortly after the pain and he was seen again on 9/20 and diagnosed with shingles.  He was placed on valacyclovir as well as a steroid and given pain medicine.  When he seen again on 9/23 he was complaining of continued pain and itching from the rash and received a small amount of additional pain medication.  The patient comes in today because his rash and pain are not improving.  He states that he feels that the rash is worse than it was upon presentation.  Last night, he had a subjective fever and was complaining of chills.  He denies headache, denies chest pain, abdominal pain.  He works as a Production designer, theatre/television/film person and feels that he cannot yet returned to work due to the pain. The assessment/plan was as follows:  While the rash is in groupings, it is difficult to tell what it looked like previously.  It does cross  multiple dermatomal distributions.  The patient does appear to have a superimposed infection to what may have caused the initial rash.  There are multiple sites of pustular drainage with surrounding erythema.  We will see the patient with IM Rocephin and placed on outpatient Bactrim and Keflex.  The patient is also concerned about how to treat his pain.  At this time I am uncomfortable continuing to give him narcotic pain medication, particularly with how quickly it has been used.  I offered the patient a dose of IM Toradol, which he accepted.  Recommended close follow-up with his primary care as well as possible follow-up with dermatology given the lack of improvement and superimposed infection.  The patient is understanding of this treatment plan and is amenable with outpatient therapy at this time.  He follows up today.  He was seen in the [facility] system on 9/20 and 9/23 for herpes zoster concerns, with him noting it was not improved on the follow-up 9/23 and prescribed Neurontin and Percocet for pain relief. He was also seen on 03/22/2020 in the [facility] system for neck pain, felt that the secondary to degenerative disc disease/cervicalgia.  Since his ER visit, he notes he has been in significant pain, which has resulted in difficulty sleeping, and get like 4 hours at night noted.  It is also been  itchy at times and he has tried not to scratch at these areas.  He has not been working in the past 2 weeks, and feeling down about missing work in addition to the pain with the shingles infection.  He currently lives with his brother, and is also talked with his mother on the phone who lives in [place] to help with support.  He has not been taking anything for pain in the recent past, and was given Percocet previously as above noted. He has been taking the 2 antibiotics recently prescribed as there were concerns for a cellulitis, possible MRSA infection per the patient's report complicating the shingles outbreak. He  has had no recent higher fevers, although had been having some subjective feverish concerns mostly at night times, no nausea or vomiting.  He denied any diminished hearing  Patient Active Problem List   Diagnosis Date Noted  . Chronic fatigue 02/02/2020  . S/P cervical spinal fusion 02/15/2019  . Preventative health care 09/08/2018  . Ventral hernia without obstruction or gangrene 09/08/2018  . History of colonoscopy with polypectomy 09/08/2018  . Hx of lower gastrointestinal bleeding 09/08/2018  . Obesity (BMI 30.0-34.9) 04/26/2018  . COPD (chronic obstructive pulmonary disease) (HCC) 04/26/2018  . Arthritis of right hand 04/26/2018  . Arthritis of left hand 04/26/2018  . Degenerative disc disease, lumbar 04/26/2018  . Knee pain, bilateral 04/26/2018  . Dry skin 04/26/2018  . Tobacco dependence 08/20/2015  . Cervical radiculopathy 06/22/2015  . Displacement of intervertebral disc at C6-C7 level 06/22/2015  . Right shoulder pain 06/22/2015      Current Outpatient Medications:  .  cephALEXin (KEFLEX) 500 MG capsule, Take 1 capsule (500 mg total) by mouth 4 (four) times daily for 10 days., Disp: 40 capsule, Rfl: 0 .  Fluticasone-Salmeterol (ADVAIR) 100-50 MCG/DOSE AEPB, Inhale 1 puff into the lungs 2 (two) times daily., Disp: , Rfl:  .  omeprazole (PRILOSEC OTC) 20 MG tablet, Take 20 mg by mouth every morning., Disp: , Rfl:  .  sulfamethoxazole-trimethoprim (BACTRIM DS) 800-160 MG tablet, Take 1 tablet by mouth 2 (two) times daily for 7 days., Disp: 14 tablet, Rfl: 0   No Known Allergies   Past Surgical History:  Procedure Laterality Date  . ANTERIOR CERVICAL DECOMPRESSION/DISCECTOMY FUSION 4 LEVELS N/A 02/15/2019   Procedure: ANTERIOR CERVICAL DECOMPRESSION/DISCECTOMY FUSION 4 LEVELS C3-7;  Surgeon: Venetia Night, MD;  Location: [facility];  Service: Neurosurgery;  Laterality: N/A;  . APPENDECTOMY  1988  . BLADDER REPAIR N/A 09/11/2015   Procedure: BLADDER REPAIR;  Surgeon:  Hildred Laser, MD;  Location: [facility];  Service: Urology;  Laterality: N/A;  . CHOLECYSTECTOMY  1989  . COLON SURGERY     for diverticulitis  . COLONOSCOPY WITH PROPOFOL N/A 08/16/2015   Procedure: COLONOSCOPY WITH PROPOFOL;  Surgeon: Midge Minium, MD;  Location: [facility];  Service: Endoscopy;  Laterality: N/A;  . CYSTOSCOPY WITH STENT PLACEMENT Bilateral 09/11/2015   Procedure: CYSTOSCOPY WITH STENT PLACEMENT;  Surgeon: Hildred Laser, MD;  Location: [facility];  Service: Urology;  Laterality: Bilateral;  . ILEOSTOMY CLOSURE N/A 11/19/2015   Procedure: ILEOSTOMY TAKEDOWN;  Surgeon: Gladis Riffle, MD;  Location: [facility];  Service: General;  Laterality: N/A;  . LAPAROSCOPIC PARTIAL COLECTOMY N/A 09/11/2015   Procedure: LAPAROSCOPIC PARTIAL COLECTOMY Possible Open, possible ostomy, repair of colovesical fistula Cystoscopy and stent placement, repair of colovesical fistula with Dr. Sherryl Barters as well ;  Surgeon: Gladis Riffle, MD;  Location: [facility];  Service:  General;  Laterality: N/A;  . POLYPECTOMY  08/16/2015   Procedure: POLYPECTOMY;  Surgeon: Midge Miniumarren Wohl, MD;  [facility];  Service: Endoscopy;;  . TONSILLECTOMY  1970     Family History  Problem Relation Age of Onset  . Cholecystitis Mother   . Lung cancer Father   . Emphysema Paternal Grandmother   . Diabetes Paternal Grandmother   . Prostate cancer Maternal Uncle   . Diabetes Brother   . Hypertension Brother      Social History   Tobacco Use  . Smoking status: Current Every Day Smoker    Packs/day: 1.00    Years: 41.00    Pack years: 41.00    Types: Cigarettes  . Smokeless tobacco: Never Used  Substance Use Topics  . Alcohol use: No    Alcohol/week: 0.0 standard drinks    With staff assistance, above reviewed with the patient today.  ROS: As per HPI, otherwise no specific complaints on a limited and focused system review   No results found for this or any previous visit (from  the past 72 hour(s)).   PHQ2/9: Depression screen [facility] 04/04/2020 02/02/2020 09/08/2018 05/24/2018 04/26/2018  Decreased Interest 3 3 2  0 0  Down, Depressed, Hopeless 3 0 0 0 1  PHQ - 2 Score 6 3 2  0 1  Altered sleeping 3 0 0 0 0  Tired, decreased energy 2 0 3 0 1  Change in appetite 1 0 0 0 0  Feeling bad or failure about yourself  3 0 0 0 0  Trouble concentrating 0 0 0 0 0  Moving slowly or fidgety/restless 0 0 0 0 0  Suicidal thoughts 2 0 0 0 0  PHQ-9 Score 17 3 5  0 2  Difficult doing work/chores Very difficult - Somewhat difficult Not difficult at all -   PHQ-2/9 Result reviewed   Fall Risk: Fall Risk  04/04/2020 02/02/2020 09/08/2018 05/24/2018 04/26/2018  Falls in the past year? 0 0 0 0 No  Number falls in past yr: 0 0 0 0 -  Injury with Fall? 0 0 0 - -  Follow up Falls evaluation completed - - - -      Objective:   Vitals:   04/04/20 0757 04/04/20 0819  BP: (!) 150/90 (!) 142/88  Pulse: 99   Resp: 16   Temp: 97.9 F (36.6 C)   TempSrc: Oral   SpO2: 98%   Weight: 207 lb 3.2 oz (94 kg)   Height: 5\' 8"  (1.727 m)     Body mass index is 31.5 kg/m.  Physical Exam   NAD, masked, not toxic appearing HEENT - [place]/AT, sclera anicteric, PERRL, conj - non-inj'ed, left TM clear, with some involvement of the healing rash on the outer left ear area progressing towards the outer canal area, although not involving the inner canal of the left ear, pharynx clear Neck - supple, no rigidity Car - RRR  Pulm- RR and effort normal at rest Skin-the left anterior chest had a large cluster of scabbing lesions, with scattered healing scabbed areas above the clavicle and towards the neck area and also towards the upper back and shoulder.  Lesser involvement of the upper back on the left evident, with a small cluster extending up the neck towards the outer left ear.  Neuro/psychiatric - affect was not flat, appropriate with conversation  Alert and oriented, hearing grossly normal on the left  today  Grossly non-focal -he was able to remove his shirt for  the exam, noting discomfort when doing so.  Speech normal   Results for orders placed or performed in visit on 02/02/20  Lipid panel  Result Value Ref Range   Cholesterol 174 <200 mg/dL   HDL 46 > OR = 40 mg/dL   Triglycerides 53 <161 mg/dL   LDL Cholesterol (Calc) 114 (H) mg/dL (calc)   Total CHOL/HDL Ratio 3.8 <5.0 (calc)   Non-HDL Cholesterol (Calc) 128 <130 mg/dL (calc)  COMPLETE METABOLIC PANEL WITH GFR  Result Value Ref Range   Glucose, Bld 77 65 - 99 mg/dL   BUN 20 7 - 25 mg/dL   Creat 0.96 0.45 - 4.09 mg/dL   GFR, Est Non African American 80 > OR = 60 mL/min/1.43m2   GFR, Est African American 92 > OR = 60 mL/min/1.29m2   BUN/Creatinine Ratio NOT APPLICABLE 6 - 22 (calc)   Sodium 142 135 - 146 mmol/L   Potassium 4.7 3.5 - 5.3 mmol/L   Chloride 105 98 - 110 mmol/L   CO2 27 20 - 32 mmol/L   Calcium 9.5 8.6 - 10.3 mg/dL   Total Protein 7.3 6.1 - 8.1 g/dL   Albumin 4.6 3.6 - 5.1 g/dL   Globulin 2.7 1.9 - 3.7 g/dL (calc)   AG Ratio 1.7 1.0 - 2.5 (calc)   Total Bilirubin 0.3 0.2 - 1.2 mg/dL   Alkaline phosphatase (APISO) 47 35 - 144 U/L   AST 23 10 - 35 U/L   ALT 15 9 - 46 U/L  TSH  Result Value Ref Range   TSH 1.07 0.40 - 4.50 mIU/L  CBC with Differential/Platelet  Result Value Ref Range   WBC 5.1 3.8 - 10.8 Thousand/uL   RBC 4.76 4.20 - 5.80 Million/uL   Hemoglobin 15.2 13.2 - 17.1 g/dL   HCT 81.1 38 - 50 %   MCV 96.4 80.0 - 100.0 fL   MCH 31.9 27.0 - 33.0 pg   MCHC 33.1 32.0 - 36.0 g/dL   RDW 91.4 78.2 - 95.6 %   Platelets 215 140 - 400 Thousand/uL   MPV 9.9 7.5 - 12.5 fL   Neutro Abs 2,586 1,500 - 7,800 cells/uL   Lymphs Abs 1,969 850 - 3,900 cells/uL   Absolute Monocytes 377 200 - 950 cells/uL   Eosinophils Absolute 117 15 - 500 cells/uL   Basophils Absolute 51 0 - 200 cells/uL   Neutrophils Relative % 50.7 %   Total Lymphocyte 38.6 %   Monocytes Relative 7.4 %   Eosinophils Relative 2.3 %    Basophils Relative 1.0 %  PSA  Result Value Ref Range   PSA 0.5 < OR = 4.0 ng/mL  ANA,IFA RA Diag Pnl w/rflx Tit/Patn  Result Value Ref Range   Anti Nuclear Antibody (ANA) NEGATIVE NEGATIVE   Rhuematoid fact SerPl-aCnc 17 (H) <14 IU/mL   Cyclic Citrullin Peptide Ab <21 UNITS   INTERPRETATION    C-reactive protein  Result Value Ref Range   CRP 1.3 <8.0 mg/L       Assessment & Plan:   1. Herpes zoster/postherpetic neuralgia Patient with a significant zoster outbreak, with concerns for a secondary cellulitis with his recent ER visit noted.  He has been on the 2 antibiotics prescribed, and still having significant pain related to the zoster, postherpetic neuralgia. Did feel adding a pain medicine was needed, and Norco was prescribed.  Discussed the importance of using this sparingly as directed, and the limited supply that can be given for acute pain management, and he  was understanding of this. He should finish the course of the 2 antibiotics prescribed by the emergency room. Also emphasized not scratching at these areas and the importance of that. Information provided in the AVS on shingles as well Noted concerns with the potential ear involvement, although is the outer ear presently, and his hearing has been okay clinically and continue to monitor.  - HYDROcodone-acetaminophen (NORCO) 10-325 MG tablet; Take 1 tablet by mouth every 8 (eight) hours as needed for up to 5 days.  Dispense: 15 tablet; Refill: 0  2. Encounter for examination following treatment at hospital  3. Cellulitis of chest wall As above, will finish the antibiotic courses as prescribed in the emergency room  4. Reactive depression Reviewed the PHQ-9 with him today, and the positive results noted on this. He states this is mostly due to his pain and the shingles infection contributing recently, as well as missing work as a result. He is hopeful to get back to work next week, and I do feel that is a reasonable goal  as we discussed. No active suicidal thoughts presently, and did feel best to have a follow-up again early next week to ensure there is improvement.  5. Elevated BP without diagnosis of hypertension His blood pressure was up some today, likely related to the pain with the zoster. We will recheck on follow-up as planned   Do feel the addition of the pain medicine will be helpful, especially helping him get some sleep, as do feel that is contributing to some of the symptoms as well. We will follow-up again at the latest mid next week, and follow-up sooner as needed. Discussed the shingles vaccine once his symptoms from this outbreak have resolved, and he was anxious to proceed with that as well.     Jamelle Haring, MD 04/04/20 8:22 AM

## 2020-04-04 ENCOUNTER — Encounter: Payer: Self-pay | Admitting: Internal Medicine

## 2020-04-04 ENCOUNTER — Ambulatory Visit (INDEPENDENT_AMBULATORY_CARE_PROVIDER_SITE_OTHER): Payer: Managed Care, Other (non HMO) | Admitting: Internal Medicine

## 2020-04-04 ENCOUNTER — Other Ambulatory Visit: Payer: Self-pay

## 2020-04-04 VITALS — BP 142/88 | HR 99 | Temp 97.9°F | Resp 16 | Ht 68.0 in | Wt 207.2 lb

## 2020-04-04 DIAGNOSIS — Z09 Encounter for follow-up examination after completed treatment for conditions other than malignant neoplasm: Secondary | ICD-10-CM | POA: Diagnosis not present

## 2020-04-04 DIAGNOSIS — B029 Zoster without complications: Secondary | ICD-10-CM

## 2020-04-04 DIAGNOSIS — F329 Major depressive disorder, single episode, unspecified: Secondary | ICD-10-CM | POA: Insufficient documentation

## 2020-04-04 DIAGNOSIS — L03313 Cellulitis of chest wall: Secondary | ICD-10-CM

## 2020-04-04 DIAGNOSIS — R030 Elevated blood-pressure reading, without diagnosis of hypertension: Secondary | ICD-10-CM

## 2020-04-04 MED ORDER — HYDROCODONE-ACETAMINOPHEN 10-325 MG PO TABS: 1.0000 | ORAL_TABLET | Freq: Three times a day (TID) | ORAL | 0 refills | Status: AC | PRN

## 2020-04-04 NOTE — Patient Instructions (Signed)
Shingles  Shingles is an infection. It gives you a painful skin rash and blisters that have fluid in them. Shingles is caused by the same germ (virus) that causes chickenpox. Shingles only happens in people who:  Have had chickenpox.  Have been given a shot of medicine (vaccine) to protect against chickenpox. Shingles is rare in this group. The first symptoms of shingles may be itching, tingling, or pain in an area on your skin. A rash will show on your skin a few days or weeks later. The rash is likely to be on one side of your body. The rash usually has a shape like a belt or a band. Over time, the rash turns into fluid-filled blisters. The blisters will break open, change into scabs, and dry up. Medicines may:  Help with pain and itching.  Help you get better sooner.  Help to prevent long-term problems. Follow these instructions at home: Medicines  Take over-the-counter and prescription medicines only as told by your doctor.  Put on an anti-itch cream or numbing cream where you have a rash, blisters, or scabs. Do this as told by your doctor. Helping with itching and discomfort   Put cold, wet cloths (cold compresses) on the area of the rash or blisters as told by your doctor.  Cool baths can help you feel better. Try adding baking soda or dry oatmeal to the water to lessen itching. Do not bathe in hot water. Blister and rash care  Keep your rash covered with a loose bandage (dressing).  Wear loose clothing that does not rub on your rash.  Keep your rash and blisters clean. To do this, wash the area with mild soap and cool water as told by your doctor.  Check your rash every day for signs of infection. Check for: ? More redness, swelling, or pain. ? Fluid or blood. ? Warmth. ? Pus or a bad smell.  Do not scratch your rash. Do not pick at your blisters. To help you to not scratch: ? Keep your fingernails clean and cut short. ? Wear gloves or mittens when you sleep, if  scratching is a problem. General instructions  Rest as told by your doctor.  Keep all follow-up visits as told by your doctor. This is important.  Wash your hands often with soap and water. If soap and water are not available, use hand sanitizer. Doing this lowers your chance of getting a skin infection caused by germs (bacteria).  Your infection can cause chickenpox in people who have never had chickenpox or never got a shot of chickenpox vaccine. If you have blisters that did not change into scabs yet, try not to touch other people or be around other people, especially: ? Babies. ? Pregnant women. ? Children who have areas of red, itchy, or rough skin (eczema). ? Very old people who have transplants. ? People who have a long-term (chronic) sickness, like cancer or AIDS. Contact a doctor if:  Your pain does not get better with medicine.  Your pain does not get better after the rash heals.  You have any signs of infection in the rash area. These signs include: ? More redness, swelling, or pain around the rash. ? Fluid or blood coming from the rash. ? The rash area feeling warm to the touch. ? Pus or a bad smell coming from the rash. Get help right away if:  The rash is on your face or nose.  You have pain in your face or pain by   your eye.  You lose feeling on one side of your face.  You have trouble seeing.  You have ear pain, or you have ringing in your ear.  You have a loss of taste.  Your condition gets worse. Summary  Shingles gives you a painful skin rash and blisters that have fluid in them.  Shingles is an infection. It is caused by the same germ (virus) that causes chickenpox.  Keep your rash covered with a loose bandage (dressing). Wear loose clothing that does not rub on your rash.  If you have blisters that did not change into scabs yet, try not to touch other people or be around people. This information is not intended to replace advice given to you by  your health care provider. Make sure you discuss any questions you have with your health care provider. Document Revised: 10/14/2018 Document Reviewed: 02/24/2017 Elsevier Patient Education  2020 [employer]  

## 2020-04-09 NOTE — Progress Notes (Signed)
Patient ID: Peter [name], male    DOB: 01-Apr-1966, 54 y.o.   MRN: [id number]  PCP: Jamelle Haring, MD  Chief Complaint  Patient presents with  . Herpes Zoster  . Hypertension  . Depression    Subjective:   Peter [name] is a 54 y.o. male, presents to clinic with CC of the following:  Chief Complaint  Patient presents with  . Herpes Zoster  . Hypertension  . Depression    HPI:  Patient is a 54 year old male Last visit with me was 04/04/2020 for herpes zoster possibly complicated by a secondary bacterial infection At our last visit he was in significant pain, and having difficulty sleeping.  Not been able to work as a result.  He was provided a prescription for Norco at the last visit to help with pain management. Also had a positive PHQ score, with the shingles infection likely a major contributor noted in our discussion.  He currently lives with his brother and also had talked with his mom on the phone who lives in [place] for support. He was taking 2 antibiotics recently prescribed for the cellulitis concern/possible MRSA infection per the patient's report complicating the shingles outbreak which was treated with valacyclovir and a steroid product. Follows up today  He notes the pain has significantly improved.  He did take the Norco product and was helpful for pain, and now is using Tylenol as needed to help with pain.  He returned to work on Monday, and has been doing okay with that return.  He notes the rash is significantly lessening as well.  Notes is still quite itchy, and doing his best does not scratch at the areas as recommended. He has no recent fevers, no nausea or vomiting, no hearing loss and the left ear involvement of the rash has significantly lessened.  He notes he still is feeling some depressive symptoms, and the PHQ-9 today was reviewed.  He again noted was feeling worse with the symptoms related to this herpetic outbreak, and is feeling better as  this improves.  He denied any thoughts of hurting self or hurting others, and notes he is still feeling a little fatigued, possibly related to the herpetic outbreak as well.    Patient Active Problem List   Diagnosis Date Noted  . Herpes zoster without complication 04/04/2020  . Reactive depression 04/04/2020  . Chronic fatigue 02/02/2020  . S/P cervical spinal fusion 02/15/2019  . Preventative health care 09/08/2018  . Ventral hernia without obstruction or gangrene 09/08/2018  . History of colonoscopy with polypectomy 09/08/2018  . Hx of lower gastrointestinal bleeding 09/08/2018  . Obesity (BMI 30.0-34.9) 04/26/2018  . COPD (chronic obstructive pulmonary disease) (HCC) 04/26/2018  . Arthritis of right hand 04/26/2018  . Arthritis of left hand 04/26/2018  . Degenerative disc disease, lumbar 04/26/2018  . Knee pain, bilateral 04/26/2018  . Dry skin 04/26/2018  . Tobacco dependence 08/20/2015  . Cervical radiculopathy 06/22/2015  . Displacement of intervertebral disc at C6-C7 level 06/22/2015  . Right shoulder pain 06/22/2015      Current Outpatient Medications:  .  cephALEXin (KEFLEX) 500 MG capsule, Take 1 capsule (500 mg total) by mouth 4 (four) times daily for 10 days., Disp: 40 capsule, Rfl: 0 .  Fluticasone-Salmeterol (ADVAIR) 100-50 MCG/DOSE AEPB, Inhale 1 puff into the lungs 2 (two) times daily., Disp: , Rfl:  .  omeprazole (PRILOSEC OTC) 20 MG tablet, Take 20 mg by mouth every morning., Disp: ,  Rfl:    No Known Allergies   Past Surgical History:  Procedure Laterality Date  . ANTERIOR CERVICAL DECOMPRESSION/DISCECTOMY FUSION 4 LEVELS N/A 02/15/2019   Procedure: ANTERIOR CERVICAL DECOMPRESSION/DISCECTOMY FUSION 4 LEVELS C3-7;  Surgeon: Venetia Night, MD;  Location: [facility];  Service: Neurosurgery;  Laterality: N/A;  . APPENDECTOMY  1988  . BLADDER REPAIR N/A 09/11/2015   Procedure: BLADDER REPAIR;  Surgeon: Hildred Laser, MD;  Location: [facility];  Service:  Urology;  Laterality: N/A;  . CHOLECYSTECTOMY  1989  . COLON SURGERY     for diverticulitis  . COLONOSCOPY WITH PROPOFOL N/A 08/16/2015   Procedure: COLONOSCOPY WITH PROPOFOL;  Surgeon: Midge Minium, MD;  Location: [facility];  Service: Endoscopy;  Laterality: N/A;  . CYSTOSCOPY WITH STENT PLACEMENT Bilateral 09/11/2015   Procedure: CYSTOSCOPY WITH STENT PLACEMENT;  Surgeon: Hildred Laser, MD;  Location: [facility];  Service: Urology;  Laterality: Bilateral;  . ILEOSTOMY CLOSURE N/A 11/19/2015   Procedure: ILEOSTOMY TAKEDOWN;  Surgeon: Gladis Riffle, MD;  Location: [facility];  Service: General;  Laterality: N/A;  . LAPAROSCOPIC PARTIAL COLECTOMY N/A 09/11/2015   Procedure: LAPAROSCOPIC PARTIAL COLECTOMY Possible Open, possible ostomy, repair of colovesical fistula Cystoscopy and stent placement, repair of colovesical fistula with Dr. Sherryl Barters as well ;  Surgeon: Gladis Riffle, MD;  Location: [facility];  Service: General;  Laterality: N/A;  . POLYPECTOMY  08/16/2015   Procedure: POLYPECTOMY;  Surgeon: Midge Minium, MD;  Location: [facility];  Service: Endoscopy;;  . TONSILLECTOMY  1970     Family History  Problem Relation Age of Onset  . Cholecystitis Mother   . Lung cancer Father   . Emphysema Paternal Grandmother   . Diabetes Paternal Grandmother   . Prostate cancer Maternal Uncle   . Diabetes Brother   . Hypertension Brother      Social History   Tobacco Use  . Smoking status: Current Every Day Smoker    Packs/day: 1.00    Years: 41.00    Pack years: 41.00    Types: Cigarettes  . Smokeless tobacco: Never Used  Substance Use Topics  . Alcohol use: No    Alcohol/week: 0.0 standard drinks    With staff assistance, above reviewed with the patient today.  ROS: As per HPI, otherwise no specific complaints on a limited and focused system review   No results found for this or any previous visit (from the past 72 hour(s)).   PHQ2/9: Depression screen [facility]  2/9 04/10/2020 04/04/2020 02/02/2020 09/08/2018 05/24/2018  Decreased Interest 3 3 3 2  0  Down, Depressed, Hopeless 3 3 0 0 0  PHQ - 2 Score 6 6 3 2  0  Altered sleeping 3 3 0 0 0  Tired, decreased energy 3 2 0 3 0  Change in appetite 1 1 0 0 0  Feeling bad or failure about yourself  1 3 0 0 0  Trouble concentrating 0 0 0 0 0  Moving slowly or fidgety/restless 0 0 0 0 0  Suicidal thoughts 0 2 0 0 0  PHQ-9 Score 14 17 3 5  0  Difficult doing work/chores Somewhat difficult Very difficult - Somewhat difficult Not difficult at all   PHQ-2/9 Result reviewed   Fall Risk: Fall Risk  04/10/2020 04/04/2020 02/02/2020 09/08/2018 05/24/2018  Falls in the past year? 0 0 0 0 0  Number falls in past yr: 0 0 0 0 0  Injury with Fall? 0 0 0 0 -  Follow up -  Falls evaluation completed - - -      Objective:   Vitals:   04/10/20 0854  BP: 112/74  Pulse: 94  Resp: 16  Temp: 98 F (36.7 C)  TempSrc: Oral  SpO2: 99%  Weight: 205 lb 9.6 oz (93.3 kg)  Height: 5\' 8"  (1.727 m)    Body mass index is 31.26 kg/m.  Physical Exam   NAD, masked,  appears in much less discomfort today, was able to remove his shirt much easier, and without significant pain HEENT - [place]/AT, sclera anicteric, PERRL, conj - non-inj'ed, healing rash on the outer left ear area improved, pharynx clear Neck - supple, no rigidity, no adenopathy Car - RRR, not tachycardic Pulm- RR and effort normal at rest, clear to auscultation Skin-significant improvement of the the left anterior chest cluster of scabbing lesions, with significantly less surrounding erythema, scattered healing scabbed areas above the clavicle and towards the neck area and also towards the upper back and shoulder also much improved and lessened, also with significantly lessening erythema surrounding.   Neuro/psychiatric - affect was not flat, appropriate with conversation             Alert and oriented, hearing grossly normal on the left today testing with rubbing fingers  together             Speech normal   Results for orders placed or performed in visit on 02/02/20  Lipid panel  Result Value Ref Range   Cholesterol 174 <200 mg/dL   HDL 46 > OR = 40 mg/dL   Triglycerides 53 <132<150 mg/dL   LDL Cholesterol (Calc) 114 (H) mg/dL (calc)   Total CHOL/HDL Ratio 3.8 <5.0 (calc)   Non-HDL Cholesterol (Calc) 128 <130 mg/dL (calc)  COMPLETE METABOLIC PANEL WITH GFR  Result Value Ref Range   Glucose, Bld 77 65 - 99 mg/dL   BUN 20 7 - 25 mg/dL   Creat 4.401.06 1.020.70 - 7.251.33 mg/dL   GFR, Est Non African American 80 > OR = 60 mL/min/1.1473m2   GFR, Est African American 92 > OR = 60 mL/min/1.8773m2   BUN/Creatinine Ratio NOT APPLICABLE 6 - 22 (calc)   Sodium 142 135 - 146 mmol/L   Potassium 4.7 3.5 - 5.3 mmol/L   Chloride 105 98 - 110 mmol/L   CO2 27 20 - 32 mmol/L   Calcium 9.5 8.6 - 10.3 mg/dL   Total Protein 7.3 6.1 - 8.1 g/dL   Albumin 4.6 3.6 - 5.1 g/dL   Globulin 2.7 1.9 - 3.7 g/dL (calc)   AG Ratio 1.7 1.0 - 2.5 (calc)   Total Bilirubin 0.3 0.2 - 1.2 mg/dL   Alkaline phosphatase (APISO) 47 35 - 144 U/L   AST 23 10 - 35 U/L   ALT 15 9 - 46 U/L  TSH  Result Value Ref Range   TSH 1.07 0.40 - 4.50 mIU/L  CBC with Differential/Platelet  Result Value Ref Range   WBC 5.1 3.8 - 10.8 Thousand/uL   RBC 4.76 4.20 - 5.80 Million/uL   Hemoglobin 15.2 13.2 - 17.1 g/dL   HCT 36.645.9 38 - 50 %   MCV 96.4 80.0 - 100.0 fL   MCH 31.9 27.0 - 33.0 pg   MCHC 33.1 32.0 - 36.0 g/dL   RDW 44.013.1 34.711.0 - 42.515.0 %   Platelets 215 140 - 400 Thousand/uL   MPV 9.9 7.5 - 12.5 fL   Neutro Abs 2,586 1,500 - 7,800 cells/uL   Lymphs Abs 1,969 850 -  3,900 cells/uL   Absolute Monocytes 377 200 - 950 cells/uL   Eosinophils Absolute 117 15 - 500 cells/uL   Basophils Absolute 51 0 - 200 cells/uL   Neutrophils Relative % 50.7 %   Total Lymphocyte 38.6 %   Monocytes Relative 7.4 %   Eosinophils Relative 2.3 %   Basophils Relative 1.0 %  PSA  Result Value Ref Range   PSA 0.5 < OR = 4.0 ng/mL    ANA,IFA RA Diag Pnl w/rflx Tit/Patn  Result Value Ref Range   Anti Nuclear Antibody (ANA) NEGATIVE NEGATIVE   Rhuematoid fact SerPl-aCnc 17 (H) <14 IU/mL   Cyclic Citrullin Peptide Ab <79 UNITS   INTERPRETATION    C-reactive protein  Result Value Ref Range   CRP 1.3 <8.0 mg/L       Assessment & Plan:    1. Herpes zoster/postherpetic neuralgia Patient with a significant zoster outbreak, with concerns for a secondary cellulitis and symptoms now much improved. Do feel continuing with the Tylenol products as needed is adequate for pain control. Also emphasized continuing to not scratch at these areas and the importance of that. He has returned to work on Monday, and has been doing okay with that return.  3. Cellulitis of chest wall As above, much improved.  4. Reactive depression, possible underlying mild depression discussed Reviewed the PHQ-9 with him again today, and discussed the possibility of some underlying depression with it exacerbated with the herpetic outbreak. He noted this recent outbreak had a significant contribution, especially with the pain and discomfort and missing work. Agreed to monitor as he continues to improve/resolve from the above, and emphasized if he is still feeling more depressive symptoms or if  they are worsening, the importance of following up and he stated he would do so..   Inquired again about the shingles vaccine, and recommended waiting about 3 to 4 weeks until symptoms have completely resolved, and then can proceed with the Shingrix vaccine.   He has a planned follow-up in January, and can follow-up sooner as needed.       Jamelle Haring, MD 04/10/20 8:59 AM

## 2020-04-10 ENCOUNTER — Ambulatory Visit (INDEPENDENT_AMBULATORY_CARE_PROVIDER_SITE_OTHER): Payer: Managed Care, Other (non HMO) | Admitting: Internal Medicine

## 2020-04-10 ENCOUNTER — Encounter: Payer: Self-pay | Admitting: Internal Medicine

## 2020-04-10 ENCOUNTER — Other Ambulatory Visit: Payer: Self-pay

## 2020-04-10 VITALS — BP 112/74 | HR 94 | Temp 98.0°F | Resp 16 | Ht 68.0 in | Wt 205.6 lb

## 2020-04-10 DIAGNOSIS — F329 Major depressive disorder, single episode, unspecified: Secondary | ICD-10-CM | POA: Diagnosis not present

## 2020-04-10 DIAGNOSIS — B029 Zoster without complications: Secondary | ICD-10-CM | POA: Diagnosis not present

## 2020-04-10 DIAGNOSIS — L03313 Cellulitis of chest wall: Secondary | ICD-10-CM | POA: Diagnosis not present

## 2020-04-10 DIAGNOSIS — B0229 Other postherpetic nervous system involvement: Secondary | ICD-10-CM

## 2020-05-08 ENCOUNTER — Telehealth: Payer: Self-pay

## 2020-05-08 NOTE — Telephone Encounter (Signed)
Copied from CRM [id number]. Topic: Referral - Status >> May 08, 2020  2:06 PM Crist Infante wrote: Reason for CRM: pt would like Dr Dorris Fetch to send his referral to rheumatologist at [facility] clinic. Pt's referral was sent to [facility], and he prefers to go [place] in [place].

## 2020-05-09 NOTE — Telephone Encounter (Signed)
Referral has been redirected to [facility] Rheumat[facility] as requested.

## 2020-06-24 DIAGNOSIS — M8949 Other hypertrophic osteoarthropathy, multiple sites: Secondary | ICD-10-CM | POA: Insufficient documentation

## 2020-06-24 DIAGNOSIS — M0579 Rheumatoid arthritis with rheumatoid factor of multiple sites without organ or systems involvement: Secondary | ICD-10-CM | POA: Insufficient documentation

## 2020-06-24 DIAGNOSIS — M159 Polyosteoarthritis, unspecified: Secondary | ICD-10-CM | POA: Insufficient documentation

## 2020-07-12 ENCOUNTER — Ambulatory Visit: Payer: Managed Care, Other (non HMO) | Admitting: Rheumatology

## 2020-07-17 ENCOUNTER — Other Ambulatory Visit: Payer: Self-pay | Admitting: Physical Medicine & Rehabilitation

## 2020-07-17 DIAGNOSIS — M5414 Radiculopathy, thoracic region: Secondary | ICD-10-CM

## 2020-07-17 DIAGNOSIS — M5416 Radiculopathy, lumbar region: Secondary | ICD-10-CM

## 2020-07-25 ENCOUNTER — Ambulatory Visit
Admission: RE | Admit: 2020-07-25 | Discharge: 2020-07-25 | Disposition: A | Discharge status: Home / Self Care | Payer: Managed Care, Other (non HMO) | Source: Ambulatory Visit | Attending: Physical Medicine & Rehabilitation | Admitting: Physical Medicine & Rehabilitation

## 2020-07-25 ENCOUNTER — Other Ambulatory Visit: Payer: Self-pay

## 2020-07-25 DIAGNOSIS — M5414 Radiculopathy, thoracic region: Secondary | ICD-10-CM | POA: Diagnosis present

## 2020-07-25 DIAGNOSIS — M5416 Radiculopathy, lumbar region: Secondary | ICD-10-CM

## 2020-08-02 ENCOUNTER — Ambulatory Visit: Payer: Managed Care, Other (non HMO) | Admitting: Rheumatology

## 2020-08-05 ENCOUNTER — Ambulatory Visit: Payer: Managed Care, Other (non HMO) | Admitting: Internal Medicine

## 2020-08-05 IMAGING — RF CERVICAL SPINE - 2-3 VIEW
1 series · 4 of 4 positions shown · non-contrast
Comparison: MR cervical spine 01/11/2019

CLINICAL DATA: Intraoperative localization C3-C7 ACDF.

EXAM:
CERVICAL SPINE - 2-3 VIEW; DG C-ARM 61-120 MIN

[Series 1: unknown protocol · 0.14mm/px · 4 of 4 slices shown]
[im 1/4]
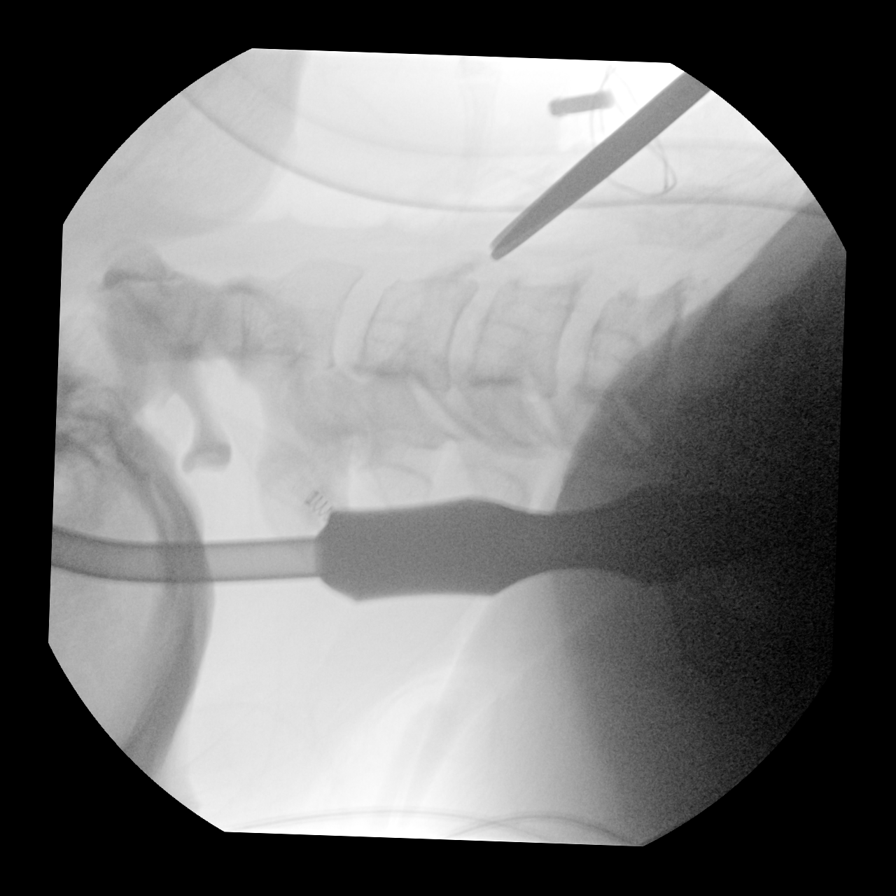
[im 2/4]
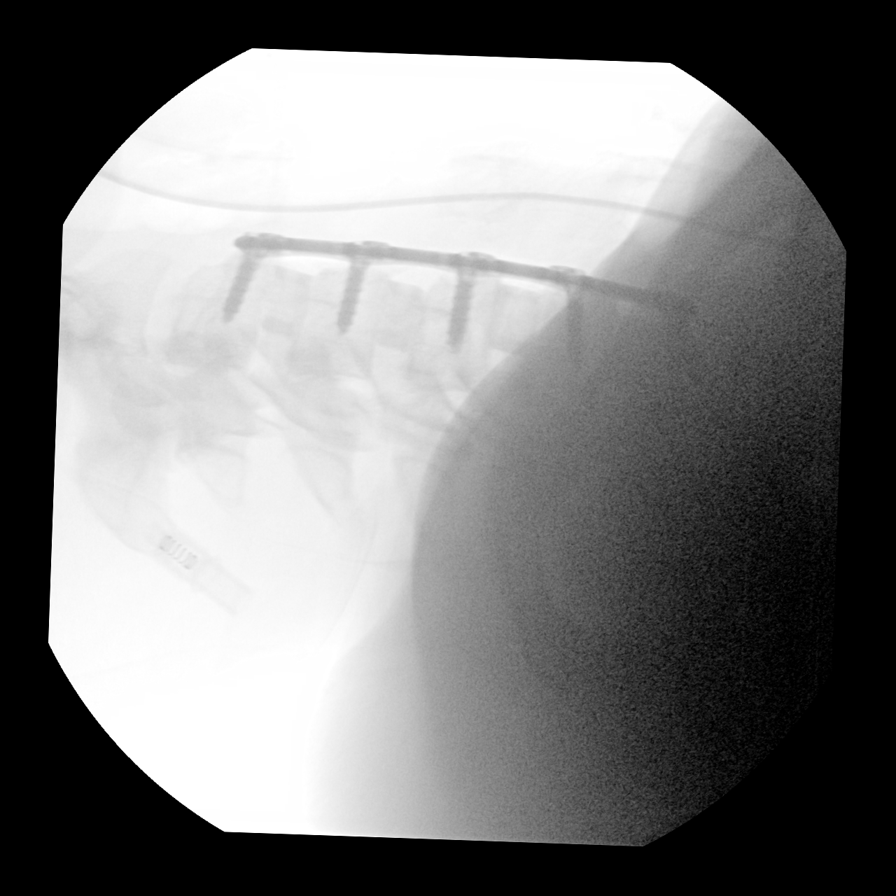
[im 3/4]
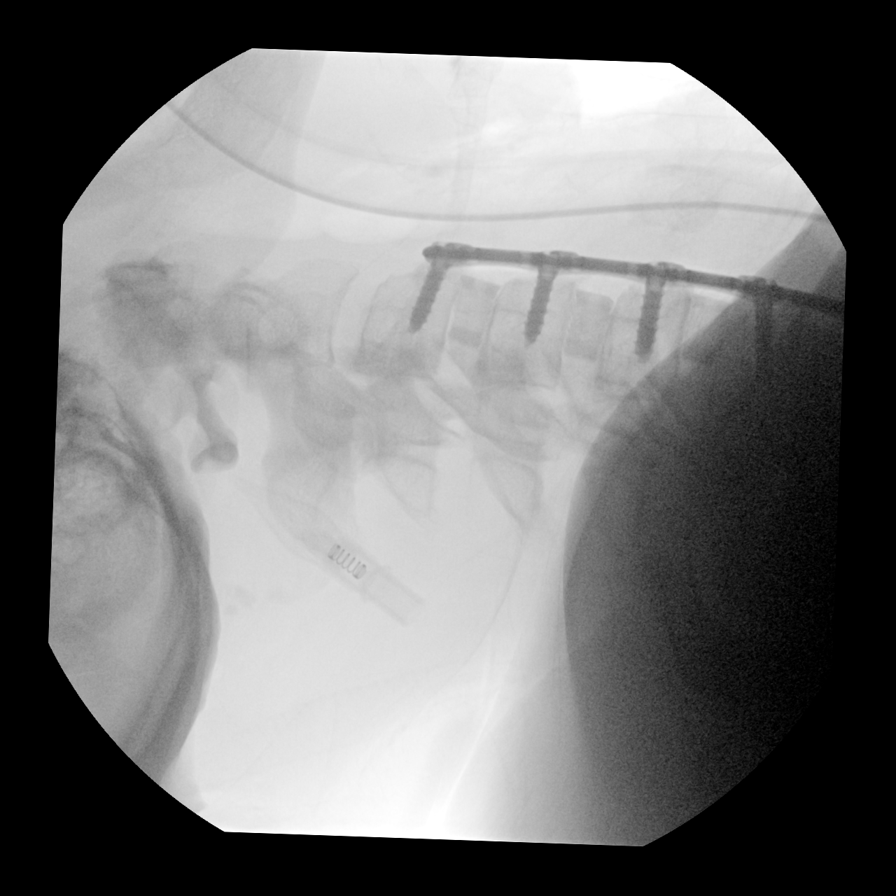
[im 4/4]
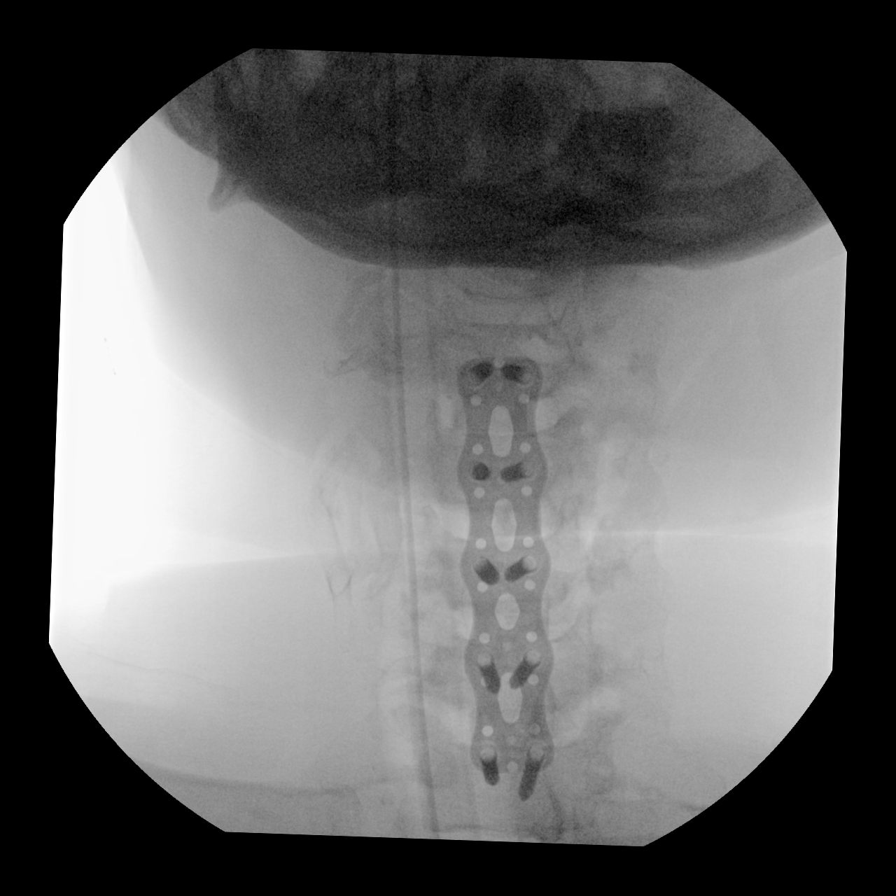

[4 of 4 positions shown; findings below may reference images not displayed]

FINDINGS: Initial fluoroscopy for localization within the cervical spine
demonstrates a surgical implements at the anterior C3-4 disc space.
Patient appears intubated at the time of exam. Laparotomy marker
seen in the anterior soft tissues. Additional support devices
project over the posterior soft tissues.

Subsequent images depict C3-C7 anterior cervical spinal fusion with
interbody spacer placement. Hardware appears intact and engaged. No
surgical complication identified. No unexpected radiopaque foreign
body the final images.
IMPRESSION: Intraoperative localization for C3-C7 anterior cervical spinal
fusion.

Final images demonstrate intact and aligned hardware without acute
complication or unexpected foreign body in the fluoroscopy field

## 2020-09-30 ENCOUNTER — Ambulatory Visit: Payer: Managed Care, Other (non HMO) | Admitting: Dermatology

## 2020-09-30 ENCOUNTER — Other Ambulatory Visit: Payer: Self-pay

## 2020-09-30 DIAGNOSIS — L853 Xerosis cutis: Secondary | ICD-10-CM | POA: Diagnosis not present

## 2020-09-30 DIAGNOSIS — B351 Tinea unguium: Secondary | ICD-10-CM

## 2020-09-30 MED ORDER — CICLOPIROX OLAMINE 0.77 % EX CREA: TOPICAL_CREAM | CUTANEOUS | 3 refills | Status: DC

## 2020-09-30 MED ORDER — TERBINAFINE HCL 250 MG PO TABS: 250.0000 mg | ORAL_TABLET | Freq: Every day | ORAL | 0 refills | Status: DC

## 2020-09-30 NOTE — Patient Instructions (Addendum)

## 2020-09-30 NOTE — Progress Notes (Signed)
   New Patient Visit  Subjective  Peter [name] is a 55 y.o. male who presents for the following: dryness (Hands, knees, elbows, itchy, painful, Rheumatologist put pt on hydroxychloroquine).  Which has helped joints.  They don't feel as achy or stiff.  New patient referral from Dr. Tracey Harries.  The following portions of the chart were reviewed this encounter and updated as appropriate:       Review of Systems:  No other skin or systemic complaints except as noted in HPI or Assessment and Plan.  Objective  Well appearing patient in no apparent distress; mood and affect are within normal limits.  A focused examination was performed including hands, elbows, knees. Relevant physical exam findings are noted in the Assessment and Plan.  Objective  bil hands/fingernails: diffuse scaling palms and hand dorsum associated nail plate dystrophy with nail thickening/hyperkeratosis and yellow white discoloration  Pt declines exam of feet, but states toenails are also involved  Images          Objective  elbows, knees: mild erythema with xerosis L elbow, bil knees,    Assessment & Plan  Tinea unguium bil hands/fingernails  /Tinea manus/pedis KOH positive hyphae Baseline LFTs from 02/02/20 labs normal  Start Terbinafine 250mg  1 po qd with food, anticipate 3-4 mos treatment Start Ciclopirox cr qd/bid aa hands, feet and around nails  Terbinafine Counseling  Terbinafine is an anti-fungal medicine that can be applied to the skin (over the counter) or taken by mouth (prescription) to treat fungal infections. The pill version is often used to treat fungal infections of the nails or scalp. While most people do not have any side effects from taking terbinafine pills, some possible side effects of the medicine can include taste changes, headache, loss of smell, vision changes, nausea, vomiting, or diarrhea.   Rare side effects can include irritation of the liver, allergic  reaction, or decrease in blood counts (which may show up as not feeling well or developing an infection). If you are concerned about any of these side effects, please stop the medicine and call your doctor, or in the case of an emergency such as feeling very unwell, seek immediate medical care.    terbinafine (LAMISIL) 250 MG tablet - bil hands/fingernails  ciclopirox (LOPROX) 0.77 % cream - bil hands/fingernails  Xerosis cutis elbows, knees  Start Amlactin or [place] Rough and bumpy cream qd/bid  Return in about 1 month (around 10/31/2020) for Tinea unguium, manus pedis.   I, 11/02/2020, RMA, am acting as scribe for Ardis Rowan, MD . Documentation: I have reviewed the above documentation for accuracy and completeness, and I agree with the above.  Willeen Niece MD

## 2020-10-01 ENCOUNTER — Ambulatory Visit: Payer: Managed Care, Other (non HMO) | Admitting: Dermatology

## 2020-10-10 DIAGNOSIS — M5441 Lumbago with sciatica, right side: Secondary | ICD-10-CM | POA: Diagnosis not present

## 2020-10-10 DIAGNOSIS — G8929 Other chronic pain: Secondary | ICD-10-CM | POA: Diagnosis not present

## 2020-10-29 ENCOUNTER — Ambulatory Visit: Payer: BC Managed Care – PPO | Admitting: Dermatology

## 2020-10-29 ENCOUNTER — Other Ambulatory Visit: Payer: Self-pay

## 2020-10-29 DIAGNOSIS — B351 Tinea unguium: Secondary | ICD-10-CM | POA: Diagnosis not present

## 2020-10-29 DIAGNOSIS — B359 Dermatophytosis, unspecified: Secondary | ICD-10-CM

## 2020-10-29 MED ORDER — TERBINAFINE HCL 250 MG PO TABS: 250.0000 mg | ORAL_TABLET | Freq: Every day | ORAL | 1 refills | Status: DC

## 2020-10-29 NOTE — Patient Instructions (Addendum)
Terbinafine Counseling  Terbinafine is an anti-fungal medicine that can be applied to the skin (over the counter) or taken by mouth (prescription) to treat fungal infections. The pill version is often used to treat fungal infections of the nails or scalp. While most people do not have any side effects from taking terbinafine pills, some possible side effects of the medicine can include taste changes, headache, loss of smell, vision changes, nausea, vomiting, or diarrhea.   Rare side effects can include irritation of the liver, allergic reaction, or decrease in blood counts (which may show up as not feeling well or developing an infection). If you are concerned about any of these side effects, please stop the medicine and call your doctor, or in the case of an emergency such as feeling very unwell, seek immediate medical care.    If you have any questions or concerns for your doctor, please call our main line at [phone] and press option 4 to reach your doctor's medical assistant. If no one answers, please leave a voicemail as directed and we will return your call as soon as possible. Messages left after 4 pm will be answered the following business day.   You may also send us a message via MyChart. We typically respond to MyChart messages within 1-2 business days.  For prescription refills, please ask your pharmacy to contact our office. Our fax number is [phone].  If you have an urgent issue when the clinic is closed that cannot wait until the next business day, you can page your doctor at the number below.    Please note that while we do our best to be available for urgent issues outside of office hours, we are not available 24/7.   If you have an urgent issue and are unable to reach us, you may choose to seek medical care at your doctor's office, retail clinic, urgent care center, or emergency room.  If you have a medical emergency, please immediately call 911 or go to the emergency  department.  Pager Numbers  - Dr. Kowalski: [phone]  - Dr. Moye: [phone]  - Dr. Stewart: [phone]  In the event of inclement weather, please call our main line at [phone] for an update on the status of any delays or closures.  Dermatology Medication Tips: Please keep the boxes that topical medications come in in order to help keep track of the instructions about where and how to use these. Pharmacies typically print the medication instructions only on the boxes and not directly on the medication tubes.   If your medication is too expensive, please contact our office at [phone] option 4 or send us a message through MyChart.   We are unable to tell what your co-pay for medications will be in advance as this is different depending on your insurance coverage. However, we may be able to find a substitute medication at lower cost or fill out paperwork to get insurance to cover a needed medication.   If a prior authorization is required to get your medication covered by your insurance company, please allow us 1-2 business days to complete this process.  Drug prices often vary depending on where the prescription is filled and some pharmacies may offer cheaper prices.  The website www.goodrx.com contains coupons for medications through different pharmacies. The prices here do not account for what the cost may be with help from insurance (it may be cheaper with your insurance), but the website can give you the price if you did not   use any insurance.  - You can print the associated coupon and take it with your prescription to the pharmacy.  - You may also stop by our office during regular business hours and pick up a GoodRx coupon card.  - If you need your prescription sent electronically to a different pharmacy, notify our office through [facility] MyChart or by phone at [phone] option 4.  

## 2020-10-29 NOTE — Progress Notes (Signed)
   Follow-Up Visit   Subjective  Peter [name] is a 55 y.o. male who presents for the following: Follow-up (Patient here for 1 month follow-up tinea unguium, pedis, and manus. He finished 1 month of terbinafine 250mg  this morning. He is also using ciclopirox cream twice daily. His hands have improved.). Patient also has arthritis and is taking hydroxychloroquine which helps.   The following portions of the chart were reviewed this encounter and updated as appropriate:       Review of Systems:  No other skin or systemic complaints except as noted in HPI or Assessment and Plan.  Objective  Well appearing patient in no apparent distress; mood and affect are within normal limits.  A focused examination was performed including hands, fingernails. Relevant physical exam findings are noted in the Assessment and Plan.  Objective  hands, fingernails, feet: Fingernail dystrophy. Patient declines feet examination today, but states he also has involvement of the feet and toenails which are improving.    Assessment & Plan  Tinea hands, fingernails, feet  Tinea Manus/Pedis/Unguium  Improving. Photos of hands from previous visit compared.  Labs ordered today.  Continue Ciclopirox cream BID to hands and feet.  Continue terbinafine 250mg  take 1 po QD dsp #30 1Rf.   May need 4th month of treatment with terbinafine, will re-assess at next visit.  If nails not improving with treatment, patient may also have concurrent nail psoriasis.  Terbinafine Counseling  Terbinafine is an anti-fungal medicine that can be applied to the skin (over the counter) or taken by mouth (prescription) to treat fungal infections. The pill version is often used to treat fungal infections of the nails or scalp. While most people do not have any side effects from taking terbinafine pills, some possible side effects of the medicine can include taste changes, headache, loss of smell, vision changes, nausea, vomiting, or  diarrhea.   Rare side effects can include irritation of the liver, allergic reaction, or decrease in blood counts (which may show up as not feeling well or developing an infection). If you are concerned about any of these side effects, please stop the medicine and call your doctor, or in the case of an emergency such as feeling very unwell, seek immediate medical care.    Other Related Procedures Hepatic Function Panel  Tinea unguium  Reordered Medications terbinafine (LAMISIL) 250 MG tablet  Other Related Medications ciclopirox (LOPROX) 0.77 % cream  Return in about 2 months (around 12/29/2020) for tinea.   I , CMA, am acting as scribe for 12/31/2020, MD .  Documentation: I have reviewed the above documentation for accuracy and completeness, and I agree with the above.  Cherlyn Labella MD

## 2020-10-30 DIAGNOSIS — B359 Dermatophytosis, unspecified: Secondary | ICD-10-CM | POA: Diagnosis not present

## 2020-10-31 LAB — HEPATIC FUNCTION PANEL
ALT: 11 IU/L (ref 0–44)
AST: 21 IU/L (ref 0–40)
Albumin: 4.4 g/dL (ref 3.8–4.9)
Alkaline Phosphatase: 52 IU/L (ref 44–121)
Bilirubin Total: <0.2 mg/dL (ref 0.0–1.2)
Bilirubin, Direct: <0.1 mg/dL (ref 0.00–0.40)
Total Protein: 6.7 g/dL (ref 6.0–8.5)

## 2020-11-03 NOTE — Progress Notes (Signed)
Patient: Peter [name]  Service Category: E/M  Provider: Gaspar Cola, MD  DOB: 04-Sep-1965  DOS: 11/04/2020  Referring Provider: Quintin Alto, MD  MRN: [id number]  Setting: Ambulatory outpatient  PCP: Towanda Malkin, MD  Type: New Patient  Specialty: Interventional Pain Management    Location: Office  Delivery: Face-to-face     Primary Reason(s) for Visit: Encounter for initial evaluation of one or more chronic problems (new to examiner) potentially causing chronic pain, and posing a threat to normal musculoskeletal function. (Level of risk: High) CC: Back Pain (lower) and Joint Pain (Rheumatoid arthritis)  HPI  Mr. Sissel is a 55 y.o. year old, male patient, who comes for the first time to our practice referred by Quintin Alto, MD for our initial evaluation of his chronic pain. He has Cervical radiculopathy; Displacement of intervertebral disc at C6-C7 level; Chronic shoulder pain (2ry area of Pain) (Bilateral) (R>L); Tobacco dependence; Obesity (BMI 30.0-34.9); COPD (chronic obstructive pulmonary disease) ([place]); Arthritis of right hand; Arthritis of left hand; Degenerative disc disease, lumbar; Chronic knee pain (6th area of Pain) (Bilateral) (R>L); Dry skin; Preventative health care; Ventral hernia without obstruction or gangrene; History of colonoscopy with polypectomy; Hx of lower gastrointestinal bleeding; S/P cervical spinal fusion; Chronic fatigue; Herpes zoster without complication; Reactive depression; Postherpetic neuralgia; Primary osteoarthritis involving multiple joints; Rheumatoid arthritis involving multiple sites with positive rheumatoid factor ([place]); Chronic pain syndrome; Pharmacologic therapy; Disorder of skeletal system; Problems influencing health status; Abnormal MRI, thoracic spine (07/26/2020); Abnormal MRI, lumbar spine (07/26/2020); Abnormal MRI, cervical spine (01/11/2019); Retrolisthesis (T12/L1) & (L1/L2); Anterolisthesis of lumbosacral spine (L5/S1); Cervical  fusion syndrome (ACDF C3-7); Chronic low back pain (1ry area of Pain) (Bilateral) (R>L) w/o sciatica; Chronic mid back pain (Bilateral) (R>L); Cervicalgia; Chronic neck pain (3ry area of Pain) (Bilateral) (R>L) w/ history of cervical spinal surgery; Cervicogenic headaches (4th area of Pain) (Bilateral) (R>L); Occipital headaches (Bilateral) (R>L); Chronic upper extremity pain (5th area of Pain) (Bilateral) (R>L); and Chronic wrist pain (7th area of Pain) (Bilateral) (R>L) on their problem list. Today he comes in for evaluation of his Back Pain (lower) and Joint Pain (Rheumatoid arthritis)  Pain Assessment: Location: Lower Back Radiating: right leg to the foot Onset: More than a month ago Duration: Chronic pain Quality: Aching,Sharp Severity: 8 /10 (subjective, self-reported pain score)  Effect on ADL: difficulty performing daily activities Timing: Constant Modifying factors: nothing BP: 119/90  HR: 89  Onset and Duration: Date of onset: 10 years Cause of pain: Unknown Severity: Getting worse Timing: Not influenced by the time of the day Aggravating Factors: Bending, Climbing, Kneeling, Lifiting, Motion, Prolonged sitting, Prolonged standing, Squatting, Stooping , Twisting, Walking, Walking uphill, Walking downhill and Working Alleviating Factors: Bending, Stretching, Hot packs, Lying down, Resting, Sitting, Sleeping, Standing, Using a brace and PT Associated Problems: Day-time cramps, Depression, Dizziness, Fatigue, Nausea, Numbness, Personality changes, Sadness, Spasms, Sweating, Temperature changes, Tingling, Weakness, Pain that wakes patient up and Pain that does not allow patient to sleep Quality of Pain: Aching, Annoying, Burning, Cramping, Disabling, Exhausting, Heavy, Itching, Nagging, Pressure-like, Sharp, Shooting, Sickening, Throbbing and Tingling Previous Examinations or Tests: CT scan, MRI scan, X-rays, Nerve conduction test, Neurological evaluation, Neurosurgical evaluation and  Psychiatric evaluation Previous Treatments: Narcotic medications, Physical Therapy, Stretching exercises and Trigger point injections  According to the patient the primary area of pain is that of the lower back (Bilateral) (R>L).  The patient denies any prior back surgeries but does indicate having had physical therapy at [facility]  approximately 3 to 4 years ago.  Not only that the physical therapy did not help but it actually made it worse according to the patient.  He indicates having had some x-rays and MRIs that are less than 45 years old.  He also indicates having had some nerve blocks in the back, which did not help, done by Dr. Girtha Hake from the [employer] department.  He indicates that after he had those injections done by Dr. Alba Destine, she referred him to Dr. Cari Caraway who then indicated that his back problems were not bad enough to warrant surgery.  Furthermore, he told him that it was very likely that he would continue having some back pain.  The patient's secondary area pain is that of the shoulders (Bilateral) (R>L).  He denies any prior surgeries but does indicate having had some shoulder injections done by Dr. Venetia Maxon at the [facility] family practice.  He denies any x-rays but he refers having had physical therapy approximately 4 to 5 years ago at [facility] which also made things worse.  The patient's third area pain is that of the neck and the posterior aspect (Bilateral) (R>L).  He indicates having had cervical spine surgery by Dr. Cari Caraway in 2021.  He denies having had any nerve blocks or formal physical therapy after the procedure.  He indicates that he was given some exercises to do, which she did.  He denies any recent x-rays of the cervical spine.  The patient's fourth area of pain is that of headaches located in the occipital region.  He indicates his headaches to be bilateral (R>L).  He denies any surgeries in that area.  He denies any nerve blocks or physical therapy.  No  recent x-rays of that area.  The patient's fifth area of pain is that of the upper extremities (Bilateral) (R>L).  In the case of the right upper extremity the pain goes all the way down to the thumb and index finger following a C6 dermatomal distribution.  In the case of the left upper extremity the pain goes all the way down into his thumb, index finger, middle finger, and ring finger and what appears to be a C6 through C8 dermatomal distribution.  He denies any nerve blocks or upper extremity surgeries.  The patient's sixth area of pain is that of the knees (Bilateral) (R>L).  He denies any prior surgeries, joint injections, physical therapy, or x-rays of the knees.  The patient's seventh area pain is that of the wrists (Bilateral) (R>L).  He denies any surgeries, recent x-rays, physical therapy, or any new nerve blocks in that area.  The patient does have a history of rheumatoid and osteoarthritis for which he is seen Dr. Posey Pronto (rheumatologist).  He refers that he was recently given some hydroxychloroquine 200 mg to be taken twice daily.  He refers that this seems to have helped to some degree.  The patient's neurosurgeon is Dr. Cari Caraway and as previously stated he had some injections done by Dr. Girtha Hake at the [facility] department.   Treatments done by Dr. Alba Destine included: Therapeutic right L4-5 transforaminal epidural injection x2 (09/03/2020; 08/14/2020) (no improvement from these).  Pharmacology: The patient indicates having taken some hydrocodone, and gabapentin in the past.  Today I took the time to provide the patient with information regarding my pain practice. The patient was informed that my practice is divided into two sections: an interventional pain management section, as well as a completely separate and distinct medication management  section. I explained that I have procedure days for my interventional therapies, and evaluation days for follow-ups and medication  management. Because of the amount of documentation required during both, they are kept separated. This means that there is the possibility that he may be scheduled for a procedure on one day, and medication management the next. I have also informed him that because of staffing and facility limitations, I no longer take patients for medication management only. To illustrate the reasons for this, I gave the patient the example of surgeons, and how inappropriate it would be to refer a patient to his/her care, just to write for the post-surgical antibiotics on a surgery done by a different surgeon.   Because interventional pain management is my board-certified specialty, the patient was informed that joining my practice means that they are open to any and all interventional therapies. I made it clear that this does not mean that they will be forced to have any procedures done. What this means is that I believe interventional therapies to be essential part of the diagnosis and proper management of chronic pain conditions. Therefore, patients not interested in these interventional alternatives will be better served under the care of a different practitioner.  The patient was also made aware of my Comprehensive Pain Management Safety Guidelines where by joining my practice, they limit all of their nerve blocks and joint injections to those done by our practice, for as long as we are retained to manage their care.   Historic Controlled Substance Pharmacotherapy Review  PMP and historical list of controlled substances: Hydrocodone/APAP 5/325 1 tab p.o. twice daily (10/01/2020) (10 MME); oxycodone/APAP 5/325 1 tab p.o. daily; hydrocodone/APAP 10/325 1 tab p.o. 3 times daily; tramadol 50 mg, 1 tab p.o. 4 times daily; oxycodone IR 5 mg 1 tab p.o. 3 times daily. Current opioid analgesics: Hydrocodone/APAP 5/325 1 tab p.o. twice daily (10/01/2020) MME/day: 10 mg/day  Historical Monitoring: The patient  reports no history  of drug use. List of all UDS Test(s): No results found for: MDMA, COCAINSCRNUR, [place], [place], CANNABQUANT, THCU, [place] List of other Serum/Urine Drug Screening Test(s):  No results found for: AMPHSCRSER, BARBSCRSER, BENZOSCRSER, COCAINSCRSER, COCAINSCRNUR, PCPSCRSER, PCPQUANT, THCSCRSER, THCU, CANNABQUANT, OPIATESCRSER, OXYSCRSER, PROPOXSCRSER, ETH Historical Background Evaluation: [place] PMP: PDMP reviewed during this encounter. Online review of the past 75-monthperiod conducted.             PMP NARX Score Report:  Narcotic: 310 Sedative: 150 Stimulant: 000 [place] Department of public safety, offender search: (Editor, commissioningInformation) Non-contributory Risk Assessment Profile: Aberrant behavior: None observed or detected today Risk factors for fatal opioid overdose: None identified today PMP NARX Overdose Risk Score: 300 Fatal overdose hazard ratio (HR): Calculation deferred Non-fatal overdose hazard ratio (HR): Calculation deferred Risk of opioid abuse or dependence: 0.7-3.0% with doses ? 36 MME/day and 6.1-26% with doses ? 120 MME/day. Substance use disorder (SUD) risk level: See below Personal History of Substance Abuse (SUD-Substance use disorder):  Alcohol: Positive Male or Male  Illegal Drugs: Positive Male or Male  Rx Drugs: Negative  ORT Risk Level calculation: High Risk  Opioid Risk Tool - 11/04/20 1045      Family History of Substance Abuse   Alcohol Positive Male    Illegal Drugs Positive Male    Rx Drugs Negative      Personal History of Substance Abuse   Alcohol Positive Male or Male    Illegal Drugs Positive Male or Male    Rx Drugs Negative  Age   Age between 58-45 years  No      History of Preadolescent Sexual Abuse   History of Preadolescent Sexual Abuse Negative or Male      Psychological Disease   Psychological Disease Negative    Depression Negative      Total Score   Opioid Risk Tool Scoring 13    Opioid Risk Interpretation High Risk           ORT Scoring interpretation table:  Score <3 = Low Risk for SUD  Score between 4-7 = Moderate Risk for SUD  Score >8 = High Risk for Opioid Abuse   PHQ-2 Depression Scale:  Total score: 0  PHQ-2 Scoring interpretation table: (Score and probability of major depressive disorder)  Score 0 = No depression  Score 1 = 15.4% Probability  Score 2 = 21.1% Probability  Score 3 = 38.4% Probability  Score 4 = 45.5% Probability  Score 5 = 56.4% Probability  Score 6 = 78.6% Probability   PHQ-9 Depression Scale:  Total score: 0  PHQ-9 Scoring interpretation table:  Score 0-4 = No depression  Score 5-9 = Mild depression  Score 10-14 = Moderate depression  Score 15-19 = Moderately severe depression  Score 20-27 = Severe depression (2.4 times higher risk of SUD and 2.89 times higher risk of overuse)   Pharmacologic Plan: As per protocol, I have not taken over any controlled substance management, pending the results of ordered tests and/or consults.            Initial impression: Pending review of available data and ordered tests.  Meds   Current Outpatient Medications:  .  ciclopirox (LOPROX) 0.77 % cream, Apply topically as directed. Qd to bid to hands, feet, and around nails, Disp: 90 g, Rfl: 3 .  Fluticasone-Salmeterol (ADVAIR) 100-50 MCG/DOSE AEPB, Inhale 1 puff into the lungs 2 (two) times daily., Disp: , Rfl:  .  hydroxychloroquine (PLAQUENIL) 200 MG tablet, Take 200 mg by mouth 2 (two) times daily., Disp: , Rfl:  .  omeprazole (PRILOSEC OTC) 20 MG tablet, Take 20 mg by mouth every morning., Disp: , Rfl:   Imaging Review  Cervical Imaging: Cervical MR wo contrast: Results for orders placed during the hospital encounter of 01/11/19 MR Cervical Spine Wo Contrast  Narrative CLINICAL DATA:  Initial evaluation for intermittent bilateral arm numbness and weakness for 1 week, headaches.  EXAM: MRI HEAD WITHOUT CONTRAST  MRI CERVICAL SPINE WITHOUT  CONTRAST  TECHNIQUE: Multiplanar, multiecho pulse sequences of the brain and surrounding structures, and cervical spine, to include the craniocervical junction and cervicothoracic junction, were obtained without intravenous contrast.  COMPARISON:  Prior MRI from 04/26/2015.  FINDINGS: MRI HEAD FINDINGS  Brain: Cerebral volume within normal limits for patient age. Few scattered subcentimeter T2/FLAIR hyperintensities noted within the periventricular and deep white matter both cerebral hemispheres, nonspecific, but felt to be within normal limits for age.  No abnormal foci of restricted diffusion to suggest acute or subacute ischemia. Gray-white matter differentiation well maintained. No encephalomalacia to suggest chronic infarction. No foci of susceptibility artifact to suggest acute or chronic intracranial hemorrhage.  No mass lesion, midline shift or mass effect. No hydrocephalus. No extra-axial fluid collection. Major dural sinuses are grossly patent.  Pituitary gland and suprasellar region are normal. Midline structures intact and normal.  Vascular: Major intracranial vascular flow voids well maintained and normal in appearance.  Skull and upper cervical spine: Craniocervical junction normal. Visualized upper cervical spine within normal limits. Bone marrow  signal intensity normal. No scalp soft tissue abnormality.  Sinuses/Orbits: Globes and orbital soft tissues within normal limits.  Paranasal sinuses are clear. Trace left mastoid effusion noted, of doubtful significance. Inner ear structures normal.  Other: None.  MRI CERVICAL SPINE FINDINGS  Alignment: Straightening of the normal cervical lordosis. No listhesis.  Vertebrae: Vertebral body height maintained without evidence for acute or chronic fracture. Bone marrow signal intensity within normal limits. No discrete or worrisome osseous lesions. No abnormal marrow edema.  Cord: Signal intensity within  the cervical spinal cord is normal.  Posterior Fossa, vertebral arteries, paraspinal tissues: Craniocervical junction within normal limits. Paraspinous and prevertebral soft tissues within normal limits. Normal intravascular flow voids seen within the vertebral arteries bilaterally.  Disc levels:  C2-C3: Mild right-sided uncovertebral hypertrophy without significant disc bulge. Advanced right-sided facet degeneration. Resultant moderate right C3 foraminal stenosis. No significant canal or left foraminal narrowing.  C3-C4: Diffuse degenerative disc osteophyte with intervertebral disc space narrowing. Broad posterior component with superimposed prominent central soft disc protrusion indents and effaces the ventral thecal sac. Secondary impingement and flattening of the cervical spinal cord without cord signal changes. Resultant severe spinal stenosis. Thecal sac measures 5 mm in AP diameter at its most narrow point. Associated severe left greater than right C4 foraminal stenosis.  C4-C5: Circumferential disc osteophyte with intervertebral disc space narrowing. Broad posterior component effaces the ventral thecal sac and resultant moderate to severe spinal stenosis. Mild cord flattening without cord signal changes. Thecal sac measures 7 mm in AP diameter. Moderate left worse than right C5 foraminal stenosis.  C5-C6: Circumferential disc osteophyte with intervertebral disc space narrowing. Broad posterior component slightly asymmetric to the right effaces the ventral thecal sac. Resultant severe spinal stenosis with associated cord flattening. No cord signal changes. Thecal sac measures 6 mm in AP diameter. Severe right worse than left C6 foraminal stenosis.  C6-C7: Diffuse disc osteophyte with intervertebral disc space narrowing. Right worse than left uncovertebral spurring. Broad posterior component effaces the ventral thecal sac resultant moderate to severe spinal stenosis.  Mild cord flattening without cord signal changes. Thecal sac measures 6 mm in AP diameter at its most narrow point. Severe right worse than left C7 foraminal stenosis.  C7-T1: Negative interspace. Mild facet hypertrophy. No significant canal or foraminal stenosis.  Visualized upper thoracic spine demonstrates mild disc bulging at T1-2 and T2-3 without significant stenosis.  IMPRESSION: MRI HEAD IMPRESSION:  Normal brain MRI for age. No acute intracranial abnormality identified.  MRI CERVICAL SPINE IMPRESSION:  1. Advanced multilevel cervical spondylolysis at C3-4 through C6-7 with resultant moderate to severe diffuse spinal stenosis. Secondary cord flattening and impingement at multiple levels without cord signal changes. 2. Multifactorial degenerative changes with resultant multilevel foraminal narrowing as above. Notable findings include moderate right C3 foraminal stenosis, severe bilateral C4 foraminal narrowing, moderate bilateral C5 foraminal stenosis, with severe bilateral C6 and C7 foraminal stenosis.   Electronically Signed By: Jeannine Boga M.D. On: 01/11/2019 23:59  Cervical DG 2-3 views: Results for orders placed during the hospital encounter of 02/15/19 DG Cervical Spine 2 or 3 views  Narrative CLINICAL DATA:  Status post cervical fusion  EXAM: CERVICAL SPINE - 2-3 VIEW  COMPARISON:  02/15/2019, MRI 01/11/2019  FINDINGS: Anterior plate and fixating screws from C3 through C7 with interbody devices at C3-C4, C4-C5, C5-C6 and C6-C7. Enlarged prevertebral soft tissues, felt consistent with recent surgery. Probable carotid vascular calcifications.  IMPRESSION: Surgical plate and fixating screws C3 through C7. Prevertebral soft tissue swelling  consistent with recent surgery.   Electronically Signed By: Donavan Foil M.D. On: 02/16/2019 18:22  Thoracic Imaging: Thoracic MR wo contrast: Results for orders placed during the hospital encounter of  07/25/20 MR THORACIC SPINE WO CONTRAST  Narrative CLINICAL DATA:  Initial evaluation for chronic back pain with radiation into the lower extremities and thighs bilaterally.  EXAM: MRI THORACIC SPINE WITHOUT CONTRAST  TECHNIQUE: Multiplanar, multisequence MR imaging of the thoracic spine was performed. No intravenous contrast was administered.  COMPARISON:  None available.  FINDINGS: Alignment: Straightening of the normal upper and midthoracic kyphosis, with slight exaggeration of the kyphotic curvature at the thoracolumbar junction. 4 mm retrolisthesis of T12 on L1. No other listhesis or static subluxation.  Vertebrae: Vertebral body height maintained without acute or chronic fracture. Bone marrow signal intensity within normal limits. No worrisome osseous lesions. Discogenic reactive endplate change with associated marrow edema present about the T11-12 and T12-L1 interspaces. More mild changes noted at T3-4 as well. No other abnormal marrow edema.  Cord:  Normal signal and morphology.  Paraspinal and other soft tissues: Paraspinous soft tissues within normal limits. Partially visualized lungs are largely clear. Visualized visceral structures are normal.  Disc levels:  T1-2: Minimal disc bulge. No spinal stenosis. Foramina remain patent.  T2-3: Mild disc bulge.  No spinal stenosis.  Foramina remain patent.  T3-4: Diffuse disc bulge with intervertebral disc space narrowing and reactive endplate change. Mild facet hypertrophy. No significant spinal stenosis. Mild to moderate right foraminal narrowing. Left neural foramina remains patent.  T4-5: Mild disc bulge with disc desiccation and intervertebral disc space narrowing. Mild facet hypertrophy. No spinal stenosis. Mild right foraminal narrowing. Left neural foramina remains patent.  T5-6: Broad-based right paracentral disc protrusion flattens and partially effaces the ventral thecal sac (series 22, image  17). Secondary flattening of the right hemi cord without cord signal changes. Mild facet hypertrophy. No significant spinal stenosis. Foramina remain patent.  T6-7: Right paracentral disc protrusion indents the right ventral thecal sac and contacts the right ventral cord (series 21, image 20). Flattening of the right hemi cord without cord signal changes or significant spinal stenosis. Foramina remain patent.  T7-8: Broad-based right paracentral disc protrusion flattens and effaces the ventral thecal sac. Mild cord flattening without cord signal changes or significant spinal stenosis. Superimposed bilateral facet hypertrophy. Foramina remain patent.  T8-9: Diffuse disc bulge. Lobulated broad-based posterior disc protrusion indents and partially effaces the ventral thecal sac, greater on the left (series 21, image 26). Mild flattening of the ventral cord without cord signal changes. Mild facet hypertrophy. No significant spinal stenosis. Foramina remain patent.  T9-10: Disc desiccation without significant disc bulge. Posterior element hypertrophy. No stenosis.  T10-11: Disc desiccation without significant disc bulge. No stenosis.  T11-12: Degenerative intervertebral disc space narrowing with mild disc bulge. Prominent reactive endplate change with associated marrow edema. Mild left greater than right facet hypertrophy. No spinal stenosis. Mild bilateral foraminal narrowing, greater on the left.  T12-L1: Retrolisthesis. Degenerative intervertebral disc space narrowing with diffuse disc bulge and disc desiccation. Prominent reactive endplate change with associated marrow edema. No spinal stenosis. Mild bilateral foraminal narrowing.  IMPRESSION: 1. Multilevel thoracic spondylosis with multifocal disc protrusions at T3-4 through T8-9 as above. No significant spinal stenosis. 2. Mild to moderate right foraminal narrowing at T3-4 and mild bilateral foraminal stenosis at T11-12 and  T12-L1 related to disc bulge and facet degeneration. 3. Prominent reactive endplate change with associated marrow edema about the T11-12 and T12-L1 interspaces. Finding  could serve as a source for back pain.   Electronically Signed By: Jeannine Boga M.D. On: 07/26/2020 03:36  Lumbosacral Imaging: Lumbar MR wo contrast: Results for orders placed during the hospital encounter of 07/25/20 MR LUMBAR SPINE WO CONTRAST  Narrative CLINICAL DATA:  Initial evaluation for chronic lower back pain with radiation into both lower extremities.  EXAM: MRI LUMBAR SPINE WITHOUT CONTRAST  TECHNIQUE: Multiplanar, multisequence MR imaging of the lumbar spine was performed. No intravenous contrast was administered.  COMPARISON:  Previous radiograph from 03/04/2011.  FINDINGS: Segmentation: Standard. Lowest well-formed disc space labeled the L5-S1 level.  Alignment: 4 mm retrolisthesis of T12 on L1. Trace retrolisthesis of L1 on L2, with trace anterolisthesis of L5 on S1. Findings chronic and facet mediated.  Vertebrae: Vertebral body height maintained without acute or chronic fracture. Underlying bone marrow signal intensity within normal limits. No worrisome osseous lesions. Prominent discogenic reactive endplate change with associated marrow edema noted about the T11-12 and T12-L1 interspaces. No other abnormal marrow edema.  Conus medullaris and cauda equina: Conus extends to the L1 level. Conus and cauda equina appear normal.  Paraspinal and other soft tissues: Paraspinous soft tissues within normal limits. 1.5 cm simple cyst noted within the interpolar right kidney. Partially visualized cystic structure at the right lower quadrant favored to reflect a portion of the cecum. Visualized visceral structures otherwise unremarkable.  Disc levels:  L1-2: Diffuse disc bulge with disc desiccation. Mild facet hypertrophy. No significant spinal stenosis. Foramina remain  patent.  L2-3: Diffuse disc bulge with disc desiccation. Superimposed right subarticular to extraforaminal disc protrusion (series 27, image 17). Associated slight inferior migration at the level of the right lateral recess (series 23, image 8). Annular fissure. Mild facet hypertrophy. Resultant mild canal with bilateral lateral recess stenosis. Mild bilateral L2 foraminal narrowing.  L3-4: Diffuse disc bulge with disc desiccation and intervertebral disc space narrowing. Mild facet and ligament flavum hypertrophy. Resultant moderate canal with moderate bilateral L3 foraminal stenosis.  L4-5: Diffuse disc bulge with disc desiccation. Superimposed shallow central disc protrusion with annular fissure. Mild left worse than right facet hypertrophy. Resultant mild canal with mild left lateral recess stenosis. Mild to moderate right with moderate left L4 foraminal narrowing.  L5-S1: Trace anterolisthesis. Broad-based right foraminal disc protrusion with associated annular fissure (series 26, image 32). Severe right worse than left facet degeneration. No spinal stenosis. Moderate right with mild left L5 foraminal narrowing.  IMPRESSION: 1. Disc bulge with facet hypertrophy at L3-4 with resultant moderate canal and moderate bilateral L3 foraminal stenosis. 2. Broad-based right foraminal disc protrusion at L5-S1 with resultant moderate right L5 foraminal stenosis. Disc bulging with facet hypertrophy at L4-5 with resultant mild left lateral recess stenosis, with mild to moderate left worse than right L4 foraminal narrowing. 3. Prominent discogenic reactive endplate change with associated marrow edema about the T11-12 and T12-L1 interspaces. Finding could contribute to underlying back pain. Could contribute to underlying back pain.   Electronically Signed By: Jeannine Boga M.D. On: 07/26/2020 03:49  Hand Imaging: Hand-R DG Complete: Results for orders placed during the hospital  encounter of 04/26/18 DG Hand Complete Right  Narrative CLINICAL DATA:  Right greater than left hand pain and swelling in the region of the MCP joints for 5 years.  EXAM: RIGHT HAND-COMPLETE 3+ VIEW  COMPARISON:  None.  FINDINGS: No acute fracture or dislocation is identified. Mild to moderate degenerative marginal osteophyte formation is noted at multiple PIP and DIP joints, most notably the index and  long fingers. No osseous erosion or soft tissue swelling is identified.  IMPRESSION: Mild to moderate osteoarthrosis in the IP joints without evidence of erosive arthropathy or acute osseous abnormality.   Electronically Signed By: Logan Bores M.D. On: 04/26/2018 16:17  Hand-L DG Complete: Results for orders placed during the hospital encounter of 04/26/18 DG Hand Complete Left  Narrative CLINICAL DATA:  Right greater than left hand pain and swelling in the region of the MCP joints for 5 years.  EXAM: LEFT HAND - COMPLETE 3+ VIEW  COMPARISON:  None.  FINDINGS: A mild angular deformity of the fifth metacarpal likely represents an old, healed fracture. No acute fracture or dislocation is identified. Mild degenerative marginal osteophyte formation is noted at multiple PIP and DIP joints in the fingers. No osseous erosion or soft tissue swelling is identified.  IMPRESSION: Mild osteoarthrosis in the IP joints without evidence of erosive arthropathy or acute osseous abnormality.   Electronically Signed By: Logan Bores M.D. On: 04/26/2018 16:15  Complexity Note: Imaging results reviewed. Results shared with Mr. Kennan, using Layman's terms.                        ROS  Cardiovascular: Daily Aspirin intake and Chest pain Pulmonary or Respiratory: Lung problems, Wheezing and difficulty taking a deep full breath (Asthma), Shortness of breath, Smoking and Snoring  Neurological: No reported neurological signs or symptoms such as seizures, abnormal skin sensations,  urinary and/or fecal incontinence, being born with an abnormal open spine and/or a tethered spinal cord Psychological-Psychiatric: Depressed Gastrointestinal: No reported gastrointestinal signs or symptoms such as vomiting or evacuating blood, reflux, heartburn, alternating episodes of diarrhea and constipation, inflamed or scarred liver, or pancreas or irrregular and/or infrequent bowel movements Genitourinary: No reported renal or genitourinary signs or symptoms such as difficulty voiding or producing urine, peeing blood, non-functioning kidney, kidney stones, difficulty emptying the bladder, difficulty controlling the flow of urine, or chronic kidney disease Hematological: Brusing easily Endocrine: No reported endocrine signs or symptoms such as high or low blood sugar, rapid heart rate due to high thyroid levels, obesity or weight gain due to slow thyroid or thyroid disease Rheumatologic: No reported rheumatological signs and symptoms such as fatigue, joint pain, tenderness, swelling, redness, heat, stiffness, decreased range of motion, with or without associated rash Musculoskeletal: Negative for myasthenia gravis, muscular dystrophy, multiple sclerosis or malignant hyperthermia Work History: Working full time  Allergies  Mr. Baney has No Known Allergies.  Laboratory Chemistry Profile   Renal Lab Results  Component Value Date   BUN 20 02/02/2020   CREATININE 1.06 33/61/2244   BCR NOT APPLICABLE 97/53/0051   GFRAA 92 02/02/2020   GFRNONAA 80 02/02/2020   PROTEINUR NEGATIVE 02/10/2019     Electrolytes Lab Results  Component Value Date   NA 142 02/02/2020   K 4.7 02/02/2020   CL 105 02/02/2020   CALCIUM 9.5 02/02/2020   MG 1.8 09/22/2015   PHOS 4.2 09/22/2015     Hepatic Lab Results  Component Value Date   AST 21 10/30/2020   ALT 11 10/30/2020   ALBUMIN 4.4 10/30/2020   ALKPHOS 52 10/30/2020   LIPASE 34 12/31/2014     ID Lab Results  Component Value Date   HIV  NON-REACTIVE 04/26/2018   SARSCOV2NAA NEGATIVE 02/13/2019   STAPHAUREUS POSITIVE (A) 02/10/2019   MRSAPCR POSITIVE (A) 02/10/2019     Bone No results found for: [place], [place], [id number], [id number], [place], [place], [place], [place],  TESTOSTERONE   Endocrine Lab Results  Component Value Date   GLUCOSE 77 02/02/2020   GLUCOSEU NEGATIVE 02/10/2019   TSH 1.07 02/02/2020     Neuropathy Lab Results  Component Value Date   HIV NON-REACTIVE 04/26/2018     CNS No results found for: COLORCSF, APPEARCSF, RBCCOUNTCSF, WBCCSF, POLYSCSF, LYMPHSCSF, EOSCSF, PROTEINCSF, GLUCCSF, JCVIRUS, CSFOLI, IGGCSF, LABACHR, ACETBL, LABACHR, ACETBL   Inflammation (CRP: Acute  ESR: Chronic) Lab Results  Component Value Date   CRP 1.3 02/02/2020     Rheumatology Lab Results  Component Value Date   RF 17 (H) 02/02/2020   ANA NEGATIVE 02/02/2020     Coagulation Lab Results  Component Value Date   INR 0.9 02/10/2019   LABPROT 12.5 02/10/2019   APTT 27 02/10/2019   PLT 215 02/02/2020     Cardiovascular Lab Results  Component Value Date   HGB 15.2 02/02/2020   HCT 45.9 02/02/2020     Screening Lab Results  Component Value Date   SARSCOV2NAA NEGATIVE 02/13/2019   STAPHAUREUS POSITIVE (A) 02/10/2019   MRSAPCR POSITIVE (A) 02/10/2019   HIV NON-REACTIVE 04/26/2018     Cancer No results found for: CEA, CA125, LABCA2   Allergens No results found for: ALMOND, APPLE, ASPARAGUS, AVOCADO, BANANA, BARLEY, BASIL, BAYLEAF, GREENBEAN, LIMABEAN, WHITEBEAN, BEEFIGE, REDBEET, BLUEBERRY, BROCCOLI, CABBAGE, MELON, CARROT, CASEIN, CASHEWNUT, CAULIFLOWER, CELERY     Note: Lab results reviewed.  PFSH  Drug: Mr. Calvin  reports no history of drug use. Alcohol:  reports no history of alcohol use. Tobacco:  reports that he has been smoking cigarettes. He has a 41.00 pack-year smoking history. He has never used smokeless tobacco. Medical:  has a past medical history of Arthritis, COPD  (chronic obstructive pulmonary disease) ([place]), Diverticulitis, Generalized headaches, GERD (gastroesophageal reflux disease), History of colonoscopy with polypectomy (09/08/2018), Lower back pain, Rectal pain, and Thrombosed hemorrhoids. Family: family history includes Cholecystitis in his mother; Diabetes in his brother and paternal grandmother; Emphysema in his paternal grandmother; Hypertension in his brother; Lung cancer in his father; Prostate cancer in his maternal uncle.  Past Surgical History:  Procedure Laterality Date  . ANTERIOR CERVICAL DECOMPRESSION/DISCECTOMY FUSION 4 LEVELS N/A 02/15/2019   Procedure: ANTERIOR CERVICAL DECOMPRESSION/DISCECTOMY FUSION 4 LEVELS C3-7;  Surgeon: Meade Maw, MD;  Location: [facility];  Service: Neurosurgery;  Laterality: N/A;  . APPENDECTOMY  1988  . BLADDER REPAIR N/A 09/11/2015   Procedure: BLADDER REPAIR;  Surgeon: Nickie Retort, MD;  Location: [facility];  Service: Urology;  Laterality: N/A;  . CHOLECYSTECTOMY  1989  . COLON SURGERY     for diverticulitis  . COLONOSCOPY WITH PROPOFOL N/A 08/16/2015   Procedure: COLONOSCOPY WITH PROPOFOL;  Surgeon: Lucilla Lame, MD;  Location: [place];  Service: Endoscopy;  Laterality: N/A;  . CYSTOSCOPY WITH STENT PLACEMENT Bilateral 09/11/2015   Procedure: CYSTOSCOPY WITH STENT PLACEMENT;  Surgeon: Nickie Retort, MD;  Location: [facility];  Service: Urology;  Laterality: Bilateral;  . ILEOSTOMY CLOSURE N/A 11/19/2015   Procedure: ILEOSTOMY TAKEDOWN;  Surgeon: Hubbard Robinson, MD;  Location: [facility];  Service: General;  Laterality: N/A;  . LAPAROSCOPIC PARTIAL COLECTOMY N/A 09/11/2015   Procedure: LAPAROSCOPIC PARTIAL COLECTOMY Possible Open, possible ostomy, repair of colovesical fistula Cystoscopy and stent placement, repair of colovesical fistula with Dr. Pilar Jarvis as well ;  Surgeon: Hubbard Robinson, MD;  Location: [facility];  Service: General;  Laterality: N/A;  . POLYPECTOMY  08/16/2015    Procedure: POLYPECTOMY;  Surgeon: Lucilla Lame, MD;  Location: [place];  Service: Endoscopy;;  . TONSILLECTOMY  1970   Active Ambulatory Problems    Diagnosis Date Noted  . Cervical radiculopathy 06/22/2015  . Displacement of intervertebral disc at C6-C7 level 06/22/2015  . Chronic shoulder pain (2ry area of Pain) (Bilateral) (R>L) 06/22/2015  . Tobacco dependence 08/20/2015  . Obesity (BMI 30.0-34.9) 04/26/2018  . COPD (chronic obstructive pulmonary disease) ([place]) 04/26/2018  . Arthritis of right hand 04/26/2018  . Arthritis of left hand 04/26/2018  . Degenerative disc disease, lumbar 04/26/2018  . Chronic knee pain (6th area of Pain) (Bilateral) (R>L) 04/26/2018  . Dry skin 04/26/2018  . Preventative health care 09/08/2018  . Ventral hernia without obstruction or gangrene 09/08/2018  . History of colonoscopy with polypectomy 09/08/2018  . Hx of lower gastrointestinal bleeding 09/08/2018  . S/P cervical spinal fusion 02/15/2019  . Chronic fatigue 02/02/2020  . Herpes zoster without complication 59/16/3846  . Reactive depression 04/04/2020  . Postherpetic neuralgia 04/10/2020  . Primary osteoarthritis involving multiple joints 06/24/2020  . Rheumatoid arthritis involving multiple sites with positive rheumatoid factor ([place]) 06/24/2020  . Chronic pain syndrome 11/04/2020  . Pharmacologic therapy 11/04/2020  . Disorder of skeletal system 11/04/2020  . Problems influencing health status 11/04/2020  . Abnormal MRI, thoracic spine (07/26/2020) 11/04/2020  . Abnormal MRI, lumbar spine (07/26/2020) 11/04/2020  . Abnormal MRI, cervical spine (01/11/2019) 11/04/2020  . Retrolisthesis (T12/L1) & (L1/L2) 11/04/2020  . Anterolisthesis of lumbosacral spine (L5/S1) 11/04/2020  . Cervical fusion syndrome (ACDF C3-7) 11/04/2020  . Chronic low back pain (1ry area of Pain) (Bilateral) (R>L) w/o sciatica 11/04/2020  . Chronic mid back pain (Bilateral) (R>L) 11/04/2020  . Cervicalgia  11/04/2020  . Chronic neck pain (3ry area of Pain) (Bilateral) (R>L) w/ history of cervical spinal surgery 11/04/2020  . Cervicogenic headaches (4th area of Pain) (Bilateral) (R>L) 11/04/2020  . Occipital headaches (Bilateral) (R>L) 11/04/2020  . Chronic upper extremity pain (5th area of Pain) (Bilateral) (R>L) 11/04/2020  . Chronic wrist pain (7th area of Pain) (Bilateral) (R>L) 11/04/2020   Resolved Ambulatory Problems    Diagnosis Date Noted  . External hemorrhoid 06/08/2011  . Diverticulitis of large intestine without perforation or abscess without bleeding   . Rectal polyp   . Stenosis of colon ([place])   . Colovesical fistula 08/20/2015  . H/O ileostomy 11/19/2015  . Ileostomy in place [facility])    Past Medical History:  Diagnosis Date  . Arthritis   . Diverticulitis   . Generalized headaches   . GERD (gastroesophageal reflux disease)   . Lower back pain   . Rectal pain   . Thrombosed hemorrhoids    Constitutional Exam  General appearance: Well nourished, well developed, and well hydrated. In no apparent acute distress Vitals:   11/04/20 1041  BP: 119/90  Pulse: 89  Resp: 16  Temp: (!) 97.4 F (36.3 C)  TempSrc: Temporal  SpO2: 97%  Weight: 220 lb (99.8 kg)  Height: '5\' 8"'  (1.727 m)   BMI Assessment: Estimated body mass index is 33.45 kg/m as calculated from the following:   Height as of this encounter: '5\' 8"'  (1.727 m).   Weight as of this encounter: 220 lb (99.8 kg).  BMI interpretation table: BMI level Category Range association with higher incidence of chronic pain  <18 kg/m2 Underweight   18.5-24.9 kg/m2 Ideal body weight   25-29.9 kg/m2 Overweight Increased incidence by 20%  30-34.9 kg/m2 Obese (Class I) Increased incidence by 68%  35-39.9 kg/m2 Severe obesity (Class  II) Increased incidence by 136%  >40 kg/m2 Extreme obesity (Class III) Increased incidence by 254%   Patient's current BMI Ideal Body weight  Body mass index is 33.45 kg/m. Ideal body weight:  68.4 kg (150 lb 12.7 oz) Adjusted ideal body weight: 81 kg (178 lb 7.6 oz)   BMI Readings from Last 4 Encounters:  11/04/20 33.45 kg/m  04/10/20 31.26 kg/m  04/04/20 31.50 kg/m  04/01/20 29.70 kg/m   Wt Readings from Last 4 Encounters:  11/04/20 220 lb (99.8 kg)  04/10/20 205 lb 9.6 oz (93.3 kg)  04/04/20 207 lb 3.2 oz (94 kg)  04/01/20 207 lb (93.9 kg)    Psych/Mental status: Alert, oriented x 3 (person, place, & time)       Eyes: PERLA Respiratory: No evidence of acute respiratory distress  Assessment  Primary Diagnosis & Pertinent Problem List: The primary encounter diagnosis was Chronic pain syndrome. Diagnoses of Pharmacologic therapy, Disorder of skeletal system, Problems influencing health status, Abnormal MRI, thoracic spine (07/26/2020), Abnormal MRI, lumbar spine (07/26/2020), Abnormal MRI, cervical spine (01/11/2019), Retrolisthesis (T12/L1) & (L1/L2), Anterolisthesis of lumbosacral spine (L5/S1), Cervical fusion syndrome (ACDF C3-7), Chronic low back pain (1ry area of Pain) (Bilateral) (R>L) w/o sciatica, Chronic mid back pain (Bilateral) (R>L), Chronic shoulder pain (2ry area of Pain) (Bilateral) (R>L), Cervicalgia, Chronic neck pain (3ry area of Pain) (Bilateral) (R>L) w/ history of cervical spinal surgery, Cervicogenic headaches (4th area of Pain) (Bilateral) (R>L), Occipital headaches (Bilateral) (R>L), Chronic upper extremity pain (5th area of Pain) (Bilateral) (R>L), Chronic knee pain (6th area of Pain) (Bilateral) (R>L), and Chronic wrist pain (7th area of Pain) (Bilateral) (R>L) were also pertinent to this visit.  Visit Diagnosis (New problems to examiner): 1. Chronic pain syndrome   2. Pharmacologic therapy   3. Disorder of skeletal system   4. Problems influencing health status   5. Abnormal MRI, thoracic spine (07/26/2020)   6. Abnormal MRI, lumbar spine (07/26/2020)   7. Abnormal MRI, cervical spine (01/11/2019)   8. Retrolisthesis (T12/L1) & (L1/L2)   9.  Anterolisthesis of lumbosacral spine (L5/S1)   10. Cervical fusion syndrome (ACDF C3-7)   11. Chronic low back pain (1ry area of Pain) (Bilateral) (R>L) w/o sciatica   12. Chronic mid back pain (Bilateral) (R>L)   13. Chronic shoulder pain (2ry area of Pain) (Bilateral) (R>L)   14. Cervicalgia   15. Chronic neck pain (3ry area of Pain) (Bilateral) (R>L) w/ history of cervical spinal surgery   16. Cervicogenic headaches (4th area of Pain) (Bilateral) (R>L)   17. Occipital headaches (Bilateral) (R>L)   18. Chronic upper extremity pain (5th area of Pain) (Bilateral) (R>L)   19. Chronic knee pain (6th area of Pain) (Bilateral) (R>L)   20. Chronic wrist pain (7th area of Pain) (Bilateral) (R>L)    Plan of Care (Initial workup plan)  Note: Mr. Loftus was reminded that as per protocol, today's visit has been an evaluation only. We have not taken over the patient's controlled substance management.  Problem-specific plan: No problem-specific Assessment & Plan notes found for this encounter.   Lab Orders     Compliance Drug Analysis, Ur     Comp. Metabolic Panel (12)     Magnesium     Vitamin B12     Sedimentation rate     25-Hydroxy vitamin D Lcms D2+D3     C-reactive protein  Imaging Orders     DG Cervical Spine With Flex & Extend     DG Lumbar Spine  Complete W/Bend     DG Knee 1-2 Views Right     DG Knee 1-2 Views Left     DG Shoulder Right     DG Shoulder Left     DG Wrist Complete Left     DG Wrist Complete Right Referral Orders  No referral(s) requested today   Procedure Orders    No procedure(s) ordered today   Pharmacotherapy (current): Medications ordered:  No orders of the defined types were placed in this encounter.  Medications administered during this visit: Ragan A. Belles had no medications administered during this visit.   Pharmacological management options:  Opioid Analgesics: The patient was informed that there is no guarantee that he would be a candidate  for opioid analgesics. The decision will be made following CDC guidelines. This decision will be based on the results of diagnostic studies, as well as Mr. Mazor risk profile.   Membrane stabilizer: To be determined at a later time  Muscle relaxant: To be determined at a later time  NSAID: To be determined at a later time  Other analgesic(s): To be determined at a later time   Interventional management options: Mr. Raden was informed that there is no guarantee that he would be a candidate for interventional therapies. The decision will be based on the results of diagnostic studies, as well as Mr. Vanblarcom risk profile.  Procedure(s) under consideration:  Pending results of ordered tests.   Provider-requested follow-up: Return for (40 min)(2V), (s/p Tests).  Future Appointments  Date Time Provider [place]  12/18/2020  2:00 PM Milinda Pointer, MD [facility] None  12/31/2020  3:30 PM Brendolyn Patty, MD [facility] None    Note by: Gaspar Cola, MD Date: 11/04/2020; Time: 11:53 AM

## 2020-11-04 ENCOUNTER — Ambulatory Visit (HOSPITAL_BASED_OUTPATIENT_CLINIC_OR_DEPARTMENT_OTHER): Payer: BC Managed Care – PPO | Admitting: Pain Medicine

## 2020-11-04 ENCOUNTER — Other Ambulatory Visit: Payer: Self-pay

## 2020-11-04 ENCOUNTER — Encounter: Payer: Self-pay | Admitting: Pain Medicine

## 2020-11-04 ENCOUNTER — Ambulatory Visit
Admission: RE | Admit: 2020-11-04 | Discharge: 2020-11-04 | Disposition: A | Discharge status: Home / Self Care | Payer: BC Managed Care – PPO | Source: Ambulatory Visit | Attending: Pain Medicine | Admitting: Pain Medicine

## 2020-11-04 ENCOUNTER — Ambulatory Visit
Admission: RE | Admit: 2020-11-04 | Discharge: 2020-11-04 | Disposition: A | Discharge status: Home / Self Care | Payer: BC Managed Care – PPO | Attending: Pain Medicine | Admitting: Pain Medicine

## 2020-11-04 ENCOUNTER — Telehealth: Payer: Self-pay

## 2020-11-04 VITALS — BP 119/90 | HR 89 | Temp 97.4°F | Resp 16 | Ht 68.0 in | Wt 220.0 lb

## 2020-11-04 DIAGNOSIS — Z79899 Other long term (current) drug therapy: Secondary | ICD-10-CM | POA: Diagnosis not present

## 2020-11-04 DIAGNOSIS — M4310 Spondylolisthesis, site unspecified: Secondary | ICD-10-CM | POA: Insufficient documentation

## 2020-11-04 DIAGNOSIS — M25511 Pain in right shoulder: Secondary | ICD-10-CM

## 2020-11-04 DIAGNOSIS — M549 Dorsalgia, unspecified: Secondary | ICD-10-CM | POA: Insufficient documentation

## 2020-11-04 DIAGNOSIS — Z9889 Other specified postprocedural states: Secondary | ICD-10-CM | POA: Insufficient documentation

## 2020-11-04 DIAGNOSIS — G8928 Other chronic postprocedural pain: Secondary | ICD-10-CM | POA: Insufficient documentation

## 2020-11-04 DIAGNOSIS — M899 Disorder of bone, unspecified: Secondary | ICD-10-CM | POA: Diagnosis not present

## 2020-11-04 DIAGNOSIS — M25531 Pain in right wrist: Secondary | ICD-10-CM | POA: Insufficient documentation

## 2020-11-04 DIAGNOSIS — M4317 Spondylolisthesis, lumbosacral region: Secondary | ICD-10-CM | POA: Insufficient documentation

## 2020-11-04 DIAGNOSIS — M19012 Primary osteoarthritis, left shoulder: Secondary | ICD-10-CM | POA: Diagnosis not present

## 2020-11-04 DIAGNOSIS — M25561 Pain in right knee: Secondary | ICD-10-CM | POA: Insufficient documentation

## 2020-11-04 DIAGNOSIS — M542 Cervicalgia: Secondary | ICD-10-CM | POA: Insufficient documentation

## 2020-11-04 DIAGNOSIS — Z789 Other specified health status: Secondary | ICD-10-CM

## 2020-11-04 DIAGNOSIS — R937 Abnormal findings on diagnostic imaging of other parts of musculoskeletal system: Secondary | ICD-10-CM

## 2020-11-04 DIAGNOSIS — M545 Low back pain, unspecified: Secondary | ICD-10-CM | POA: Insufficient documentation

## 2020-11-04 DIAGNOSIS — M25512 Pain in left shoulder: Secondary | ICD-10-CM | POA: Insufficient documentation

## 2020-11-04 DIAGNOSIS — M47816 Spondylosis without myelopathy or radiculopathy, lumbar region: Secondary | ICD-10-CM | POA: Diagnosis not present

## 2020-11-04 DIAGNOSIS — Q761 Klippel-Feil syndrome: Secondary | ICD-10-CM | POA: Insufficient documentation

## 2020-11-04 DIAGNOSIS — G894 Chronic pain syndrome: Secondary | ICD-10-CM | POA: Insufficient documentation

## 2020-11-04 DIAGNOSIS — M25532 Pain in left wrist: Secondary | ICD-10-CM

## 2020-11-04 DIAGNOSIS — M25562 Pain in left knee: Secondary | ICD-10-CM | POA: Insufficient documentation

## 2020-11-04 DIAGNOSIS — G8929 Other chronic pain: Secondary | ICD-10-CM | POA: Insufficient documentation

## 2020-11-04 DIAGNOSIS — B351 Tinea unguium: Secondary | ICD-10-CM

## 2020-11-04 DIAGNOSIS — M79602 Pain in left arm: Secondary | ICD-10-CM

## 2020-11-04 DIAGNOSIS — M79601 Pain in right arm: Secondary | ICD-10-CM

## 2020-11-04 DIAGNOSIS — M4322 Fusion of spine, cervical region: Secondary | ICD-10-CM | POA: Diagnosis not present

## 2020-11-04 DIAGNOSIS — M40205 Unspecified kyphosis, thoracolumbar region: Secondary | ICD-10-CM | POA: Diagnosis not present

## 2020-11-04 DIAGNOSIS — R519 Headache, unspecified: Secondary | ICD-10-CM | POA: Insufficient documentation

## 2020-11-04 DIAGNOSIS — Z981 Arthrodesis status: Secondary | ICD-10-CM | POA: Diagnosis not present

## 2020-11-04 DIAGNOSIS — M19032 Primary osteoarthritis, left wrist: Secondary | ICD-10-CM | POA: Diagnosis not present

## 2020-11-04 DIAGNOSIS — M4802 Spinal stenosis, cervical region: Secondary | ICD-10-CM | POA: Diagnosis not present

## 2020-11-04 DIAGNOSIS — M19031 Primary osteoarthritis, right wrist: Secondary | ICD-10-CM | POA: Diagnosis not present

## 2020-11-04 DIAGNOSIS — G4486 Cervicogenic headache: Secondary | ICD-10-CM | POA: Insufficient documentation

## 2020-11-04 DIAGNOSIS — M4056 Lordosis, unspecified, lumbar region: Secondary | ICD-10-CM | POA: Diagnosis not present

## 2020-11-04 MED ORDER — CICLOPIROX OLAMINE 0.77 % EX CREA: TOPICAL_CREAM | CUTANEOUS | 3 refills | Status: DC

## 2020-11-04 NOTE — Telephone Encounter (Signed)
-----   Message from Willeen Niece, MD sent at 11/04/2020  1:36 PM EDT ----- LFTs are normal, continue terbinafine 250 mg PO qd as prescribed (2 rfs already sent in)

## 2020-11-04 NOTE — Telephone Encounter (Signed)
Advised pt of labs and to continue the terbinafine 250mg  1 po qd.  Pt said he needed rf  for ciclopirox cream.  Sent in 3 rfs/sh

## 2020-11-04 NOTE — Patient Instructions (Signed)

## 2020-11-04 NOTE — Progress Notes (Signed)
Safety precautions to be maintained throughout the outpatient stay will include: orient to surroundings, keep bed in low position, maintain call bell within reach at all times, provide assistance with transfer out of bed and ambulation.  

## 2020-11-11 LAB — COMP. METABOLIC PANEL (12)
AST: 13 IU/L (ref 0–40)
Albumin/Globulin Ratio: 1.7 (ref 1.2–2.2)
Albumin: 4.6 g/dL (ref 3.8–4.9)
Alkaline Phosphatase: 51 IU/L (ref 44–121)
BUN/Creatinine Ratio: 7 — ABNORMAL LOW (ref 9–20)
BUN: 8 mg/dL (ref 6–24)
Bilirubin Total: <0.2 mg/dL (ref 0.0–1.2)
Calcium: 9.4 mg/dL (ref 8.7–10.2)
Chloride: 105 mmol/L (ref 96–106)
Creatinine, Ser: 1.07 mg/dL (ref 0.76–1.27)
Globulin, Total: 2.7 g/dL (ref 1.5–4.5)
Glucose: 74 mg/dL (ref 65–99)
Potassium: 4.8 mmol/L (ref 3.5–5.2)
Sodium: 143 mmol/L (ref 134–144)
Total Protein: 7.3 g/dL (ref 6.0–8.5)
eGFR: 82 mL/min/{1.73_m2} (ref >59)

## 2020-11-11 LAB — 25-HYDROXY VITAMIN D LCMS D2+D3
25-Hydroxy, Vitamin D-2: <1 ng/mL
25-Hydroxy, Vitamin D-3: 22 ng/mL
25-Hydroxy, Vitamin D: 22 ng/mL — ABNORMAL LOW

## 2020-11-11 LAB — C-REACTIVE PROTEIN: CRP: <1 mg/L (ref 0–10)

## 2020-11-11 LAB — MAGNESIUM: Magnesium: 2.1 mg/dL (ref 1.6–2.3)

## 2020-11-11 LAB — SEDIMENTATION RATE: Sed Rate: 36 mm/hr — ABNORMAL HIGH (ref 0–30)

## 2020-11-11 LAB — VITAMIN B12: Vitamin B-12: 331 pg/mL (ref 232–1245)

## 2020-11-12 LAB — COMPLIANCE DRUG ANALYSIS, UR

## 2020-11-15 ENCOUNTER — Emergency Department: Payer: BC Managed Care – PPO

## 2020-11-15 ENCOUNTER — Other Ambulatory Visit: Payer: Self-pay

## 2020-11-15 ENCOUNTER — Encounter: Payer: Self-pay | Admitting: Intensive Care

## 2020-11-15 ENCOUNTER — Emergency Department
Admission: EM | Admit: 2020-11-15 | Discharge: 2020-11-15 | Disposition: A | Discharge status: Home / Self Care | Payer: BC Managed Care – PPO | Attending: Student in an Organized Health Care Education/Training Program | Admitting: Student in an Organized Health Care Education/Training Program

## 2020-11-15 DIAGNOSIS — F17210 Nicotine dependence, cigarettes, uncomplicated: Secondary | ICD-10-CM | POA: Diagnosis not present

## 2020-11-15 DIAGNOSIS — U071 COVID-19: Secondary | ICD-10-CM | POA: Insufficient documentation

## 2020-11-15 DIAGNOSIS — R0602 Shortness of breath: Secondary | ICD-10-CM | POA: Diagnosis not present

## 2020-11-15 DIAGNOSIS — R110 Nausea: Secondary | ICD-10-CM

## 2020-11-15 DIAGNOSIS — Z7951 Long term (current) use of inhaled steroids: Secondary | ICD-10-CM | POA: Diagnosis not present

## 2020-11-15 DIAGNOSIS — J449 Chronic obstructive pulmonary disease, unspecified: Secondary | ICD-10-CM | POA: Insufficient documentation

## 2020-11-15 DIAGNOSIS — R059 Cough, unspecified: Secondary | ICD-10-CM | POA: Diagnosis not present

## 2020-11-15 DIAGNOSIS — R079 Chest pain, unspecified: Secondary | ICD-10-CM | POA: Diagnosis not present

## 2020-11-15 LAB — COMPREHENSIVE METABOLIC PANEL
ALT: 22 U/L (ref 0–44)
AST: 24 U/L (ref 15–41)
Albumin: 4.5 g/dL (ref 3.5–5.0)
Alkaline Phosphatase: 42 U/L (ref 38–126)
Anion gap: 10 (ref 5–15)
BUN: 11 mg/dL (ref 6–20)
CO2: 25 mmol/L (ref 22–32)
Calcium: 8.9 mg/dL (ref 8.9–10.3)
Chloride: 106 mmol/L (ref 98–111)
Creatinine, Ser: 0.94 mg/dL (ref 0.61–1.24)
GFR, Estimated: >60 mL/min (ref >60)
Glucose, Bld: 89 mg/dL (ref 70–99)
Potassium: 3.8 mmol/L (ref 3.5–5.1)
Sodium: 141 mmol/L (ref 135–145)
Total Bilirubin: 0.4 mg/dL (ref 0.3–1.2)
Total Protein: 8.5 g/dL — ABNORMAL HIGH (ref 6.5–8.1)

## 2020-11-15 LAB — CBC
HCT: 44.6 % (ref 39.0–52.0)
Hemoglobin: 14.8 g/dL (ref 13.0–17.0)
MCH: 31.1 pg (ref 26.0–34.0)
MCHC: 33.2 g/dL (ref 30.0–36.0)
MCV: 93.7 fL (ref 80.0–100.0)
Platelets: 189 10*3/uL (ref 150–400)
RBC: 4.76 MIL/uL (ref 4.22–5.81)
RDW: 13.2 % (ref 11.5–15.5)
WBC: 4.5 10*3/uL (ref 4.0–10.5)
nRBC: 0 % (ref 0.0–0.2)

## 2020-11-15 LAB — RESP PANEL BY RT-PCR (FLU A&B, COVID) ARPGX2
Influenza A by PCR: NEGATIVE
Influenza B by PCR: NEGATIVE
SARS Coronavirus 2 by RT PCR: POSITIVE — AB

## 2020-11-15 MED ORDER — HYDROCODONE-ACETAMINOPHEN 5-325 MG PO TABS
1.0000 | ORAL_TABLET | Freq: Once | ORAL | Status: AC
  Administered 2020-11-15: 1 via ORAL
  Filled 2020-11-15: qty 1

## 2020-11-15 MED ORDER — PREDNISONE 20 MG PO TABS: 40.0000 mg | ORAL_TABLET | Freq: Every day | ORAL | 0 refills | Status: AC

## 2020-11-15 MED ORDER — IPRATROPIUM-ALBUTEROL 0.5-2.5 (3) MG/3ML IN SOLN
3.0000 mL | Freq: Once | RESPIRATORY_TRACT | Status: AC
  Administered 2020-11-15: 3 mL via RESPIRATORY_TRACT
  Filled 2020-11-15: qty 3

## 2020-11-15 MED ORDER — PREDNISONE 20 MG PO TABS
60.0000 mg | ORAL_TABLET | Freq: Once | ORAL | Status: AC
  Administered 2020-11-15: 60 mg via ORAL
  Filled 2020-11-15: qty 3

## 2020-11-15 MED ORDER — ONDANSETRON HCL 4 MG PO TABS: 4.0000 mg | ORAL_TABLET | Freq: Every day | ORAL | 0 refills | Status: DC | PRN

## 2020-11-15 NOTE — ED Notes (Signed)
Pt with Xray 

## 2020-11-15 NOTE — ED Notes (Addendum)
See triage note, pt has been having ongoing problems with back pain and mainly right sided arm and leg numbness, tingling, and weakness. Pt states his leg gave out yesterday and he fell at home, denies injury from fall. 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 Pt states his main concern is lower right back pain, has been told he has degenerative disks. Pt reportedly not taking anything for pain, has taken oxycodone in the past. Pt is AOX4, able to move upper and lower extremities equally.

## 2020-11-15 NOTE — ED Triage Notes (Signed)
Patient c/o numbness in back, bilateral arms and bilateral legs. Reports it is hard for him to do his job. Ongoing problem since February

## 2020-11-15 NOTE — ED Provider Notes (Signed)
[facility] Emergency Department Provider Note    Event Date/Time   First MD Initiated Contact with Patient 11/15/20 1113     (approximate)  I have reviewed the triage vital signs and the nursing notes.   HISTORY  Chief Complaint Numbness and Back Pain    HPI RITVIK MCZEAL is a 55 y.o. male with below listed past medical history presents to the ER for evaluation of cough lightheadedness loss of taste and weakness after patient returned to work.  He is got a long history of chronic back pain and sciatica.  States that no having any changes with this.  States he been referred to pain management but he is having trouble doing work due to chronic pain and discomfort.  No new numbness or tingling.  States he has been using his inhaler more often.  He is not certain around whether he has been exposed to anyone with COVID over the past few days.    Past Medical History:  Diagnosis Date  . Arthritis    neck, shoulders  . COPD (chronic obstructive pulmonary disease) (HCC)   . Diverticulitis   . Generalized headaches   . GERD (gastroesophageal reflux disease)    occ  . History of colonoscopy with polypectomy 09/08/2018   Aug 16, 2015  . Lower back pain   . Rectal pain   . Thrombosed hemorrhoids    Family History  Problem Relation Age of Onset  . Cholecystitis Mother   . Lung cancer Father   . Emphysema Paternal Grandmother   . Diabetes Paternal Grandmother   . Prostate cancer Maternal Uncle   . Diabetes Brother   . Hypertension Brother    Past Surgical History:  Procedure Laterality Date  . ANTERIOR CERVICAL DECOMPRESSION/DISCECTOMY FUSION 4 LEVELS N/A 02/15/2019   Procedure: ANTERIOR CERVICAL DECOMPRESSION/DISCECTOMY FUSION 4 LEVELS C3-7;  Surgeon: Venetia Night, MD;  Location: [facility] OR[facility];  Service: Neurosurgery;  Laterality: N/A;  . APPENDECTOMY  1988  . BLADDER REPAIR N/A 09/11/2015   Procedure: BLADDER REPAIR;  Surgeon: Hildred Laser, MD;   Location: [facility];  Service: Urology;  Laterality: N/A;  . CHOLECYSTECTOMY  1989  . COLON SURGERY     for diverticulitis  . COLONOSCOPY WITH PROPOFOL N/A 08/16/2015   Procedure: COLONOSCOPY WITH PROPOFOL;  Surgeon: Midge Minium, MD;  Location: [facility];  Service: Endoscopy;  Laterality: N/A;  . CYSTOSCOPY WITH STENT PLACEMENT Bilateral 09/11/2015   Procedure: CYSTOSCOPY WITH STENT PLACEMENT;  Surgeon: Hildred Laser, MD;  Location: [facility];  Service: Urology;  Laterality: Bilateral;  . ILEOSTOMY CLOSURE N/A 11/19/2015   Procedure: ILEOSTOMY TAKEDOWN;  Surgeon: Gladis Riffle, MD;  Location: [facility] OR[facility];  Service: General;  Laterality: N/A;  . LAPAROSCOPIC PARTIAL COLECTOMY N/A 09/11/2015   Procedure: LAPAROSCOPIC PARTIAL COLECTOMY Possible Open, possible ostomy, repair of colovesical fistula Cystoscopy and stent placement, repair of colovesical fistula with Dr. Sherryl Barters as well ;  Surgeon: Gladis Riffle, MD;  Location: [facility];  Service: General;  Laterality: N/A;  . POLYPECTOMY  08/16/2015   Procedure: POLYPECTOMY;  Surgeon: Midge Minium, MD;  Location: [facility];  Service: Endoscopy;;  . TONSILLECTOMY  1970   Patient Active Problem List   Diagnosis Date Noted  . Chronic pain syndrome 11/04/2020  . Pharmacologic therapy 11/04/2020  . Disorder of skeletal system 11/04/2020  . Problems influencing health status 11/04/2020  . Abnormal MRI, thoracic spine (07/26/2020) 11/04/2020  . Abnormal MRI, lumbar spine (07/26/2020) 11/04/2020  .  Abnormal MRI, cervical spine (01/11/2019) 11/04/2020  . Retrolisthesis (T12/L1) & (L1/L2) 11/04/2020  . Anterolisthesis of lumbosacral spine (L5/S1) 11/04/2020  . Cervical fusion syndrome (ACDF C3-7) 11/04/2020  . Chronic low back pain (1ry area of Pain) (Bilateral) (R>L) w/o sciatica 11/04/2020  . Chronic mid back pain (Bilateral) (R>L) 11/04/2020  . Cervicalgia 11/04/2020  . Chronic neck pain (3ry area of Pain) (Bilateral) (R>L) w/  history of cervical spinal surgery 11/04/2020  . Cervicogenic headaches (4th area of Pain) (Bilateral) (R>L) 11/04/2020  . Occipital headaches (Bilateral) (R>L) 11/04/2020  . Chronic upper extremity pain (5th area of Pain) (Bilateral) (R>L) 11/04/2020  . Chronic wrist pain (7th area of Pain) (Bilateral) (R>L) 11/04/2020  . Primary osteoarthritis involving multiple joints 06/24/2020  . Rheumatoid arthritis involving multiple sites with positive rheumatoid factor (HCC) 06/24/2020  . Postherpetic neuralgia 04/10/2020  . Herpes zoster without complication 04/04/2020  . Reactive depression 04/04/2020  . Chronic fatigue 02/02/2020  . S/P cervical spinal fusion 02/15/2019  . Preventative health care 09/08/2018  . Ventral hernia without obstruction or gangrene 09/08/2018  . History of colonoscopy with polypectomy 09/08/2018  . Hx of lower gastrointestinal bleeding 09/08/2018  . Obesity (BMI 30.0-34.9) 04/26/2018  . COPD (chronic obstructive pulmonary disease) (HCC) 04/26/2018  . Arthritis of right hand 04/26/2018  . Arthritis of left hand 04/26/2018  . Degenerative disc disease, lumbar 04/26/2018  . Chronic knee pain (6th area of Pain) (Bilateral) (R>L) 04/26/2018  . Dry skin 04/26/2018  . Tobacco dependence 08/20/2015  . Cervical radiculopathy 06/22/2015  . Displacement of intervertebral disc at C6-C7 level 06/22/2015  . Chronic shoulder pain (2ry area of Pain) (Bilateral) (R>L) 06/22/2015      Prior to Admission medications   Medication Sig Start Date End Date Taking? Authorizing Provider  ondansetron (ZOFRAN) 4 MG tablet Take 1 tablet (4 mg total) by mouth daily as needed. 11/15/20 11/15/21 Yes Willy Eddyobinson, Dessie Tatem, MD  predniSONE (DELTASONE) 20 MG tablet Take 2 tablets (40 mg total) by mouth daily for 5 days. 11/15/20 11/20/20 Yes Willy Eddyobinson, Clements Toro, MD  ciclopirox (LOPROX) 0.77 % cream Apply topically as directed. Qd to bid to hands, feet, and around nails 11/04/20   Willeen NieceStewart, Tara, MD   Fluticasone-Salmeterol (ADVAIR) 100-50 MCG/DOSE AEPB Inhale 1 puff into the lungs 2 (two) times daily.    [provider]  hydroxychloroquine (PLAQUENIL) 200 MG tablet Take 200 mg by mouth 2 (two) times daily. 09/19/20   [provider]  omeprazole (PRILOSEC OTC) 20 MG tablet Take 20 mg by mouth every morning.    [provider]    Allergies Patient has no known allergies.    Social History Social History   Tobacco Use  . Smoking status: Current Every Day Smoker    Packs/day: 1.00    Years: 41.00    Pack years: 41.00    Types: Cigarettes  . Smokeless tobacco: Never Used  Vaping Use  . Vaping Use: Never used  Substance Use Topics  . Alcohol use: No    Alcohol/week: 0.0 standard drinks  . Drug use: No    Review of Systems Patient denies headaches, rhinorrhea, blurry vision, numbness, shortness of breath, chest pain, edema, cough, abdominal pain, nausea, vomiting, diarrhea, dysuria, fevers, rashes or hallucinations unless otherwise stated above in HPI. ____________________________________________   PHYSICAL EXAM:  VITAL SIGNS: Vitals:   11/15/20 1326 11/15/20 1327  BP:    Pulse: 77 84  Resp:    Temp:    SpO2: 97% 97%  Constitutional: Alert and oriented.  Eyes: Conjunctivae are normal.  Head: Atraumatic. Nose: No congestion/rhinnorhea. Mouth/Throat: Mucous membranes are moist.   Neck: No stridor. Painless ROM.  Cardiovascular: Normal rate, regular rhythm. Grossly normal heart sounds.  Good peripheral circulation. Respiratory: Normal respiratory effort.  No retractions. Lungs with coarse evpiratry wheeze throughout Gastrointestinal: Soft and nontender. No distention. No abdominal bruits. No CVA tenderness. Genitourinary:  Musculoskeletal: No lower extremity tenderness nor edema.  No joint effusions. Neurologic:  Normal speech and language. No gross focal neurologic deficits are appreciated. No facial droop Skin:  Skin is warm, dry and  intact. No rash noted. Psychiatric: Mood and affect are normal. Speech and behavior are normal.  ____________________________________________   LABS (all labs ordered are listed, but only abnormal results are displayed)  Results for orders placed or performed during the hospital encounter of 11/15/20 (from the past 24 hour(s))  CBC     Status: None   Collection Time: 11/15/20 11:45 AM  Result Value Ref Range   WBC 4.5 4.0 - 10.5 K/uL   RBC 4.76 4.22 - 5.81 MIL/uL   Hemoglobin 14.8 13.0 - 17.0 g/dL   HCT 16.6 06.3 - 01.6 %   MCV 93.7 80.0 - 100.0 fL   MCH 31.1 26.0 - 34.0 pg   MCHC 33.2 30.0 - 36.0 g/dL   RDW 01.0 93.2 - 35.5 %   Platelets 189 150 - 400 K/uL   nRBC 0.0 0.0 - 0.2 %  Comprehensive metabolic panel     Status: Abnormal   Collection Time: 11/15/20 11:45 AM  Result Value Ref Range   Sodium 141 135 - 145 mmol/L   Potassium 3.8 3.5 - 5.1 mmol/L   Chloride 106 98 - 111 mmol/L   CO2 25 22 - 32 mmol/L   Glucose, Bld 89 70 - 99 mg/dL   BUN 11 6 - 20 mg/dL   Creatinine, Ser 7.32 0.61 - 1.24 mg/dL   Calcium 8.9 8.9 - 20.2 mg/dL   Total Protein 8.5 (H) 6.5 - 8.1 g/dL   Albumin 4.5 3.5 - 5.0 g/dL   AST 24 15 - 41 U/L   ALT 22 0 - 44 U/L   Alkaline Phosphatase 42 38 - 126 U/L   Total Bilirubin 0.4 0.3 - 1.2 mg/dL   GFR, Estimated >54 >27 mL/min   Anion gap 10 5 - 15  Resp Panel by RT-PCR (Flu A&B, Covid) Nasopharyngeal Swab     Status: Abnormal   Collection Time: 11/15/20 11:54 AM   Specimen: Nasopharyngeal Swab; Nasopharyngeal(NP) swabs in vial transport medium  Result Value Ref Range   SARS Coronavirus 2 by RT PCR POSITIVE (A) NEGATIVE   Influenza A by PCR NEGATIVE NEGATIVE   Influenza B by PCR NEGATIVE NEGATIVE   __________________________________________________  RADIOLOGY  I personally reviewed all radiographic images ordered to evaluate for the above acute complaints and reviewed radiology reports and findings.  These findings were personally discussed with  the patient.  Please see medical record for radiology report.  ____________________________________________   PROCEDURES  Procedure(s) performed:  Procedures    Critical Care performed: no ____________________________________________   INITIAL IMPRESSION / ASSESSMENT AND PLAN / ED COURSE  Pertinent labs & imaging results that were available during my care of the patient were reviewed by me and considered in my medical decision making (see chart for details).   DDX: COVID, pneumonia, URI, dehydration, COPD exacerbation  VINAL ROSENGRANT is a 55 y.o. who presents to the ED with presentation  as described above.  Patient well nontoxic but reporting loss of smell and taste felt lightheaded had episode of nausea.  Symptoms concerning for COVID.  Blood work reassuring.  No sign of infiltrates.  Treated for mild COPD exacerbation but not hypoxic.  Did test positive for COVID.  Given his history of COPD will refer to COVID outpatient treatment.  Does appear stable and appropriate for outpatient management.  Gust strict return precautions.     The patient was evaluated in Emergency Department today for the symptoms described in the history of present illness. He/she was evaluated in the context of the global COVID-19 pandemic, which necessitated consideration that the patient might be at risk for infection with the SARS-CoV-2 virus that causes COVID-19. Institutional protocols and algorithms that pertain to the evaluation of patients at risk for COVID-19 are in a state of rapid change based on information released by regulatory bodies including the CDC and federal and state organizations. These policies and algorithms were followed during the patient's care in the ED.  As part of my medical decision making, I reviewed the following data within the electronic MEDICAL RECORD NUMBER Nursing notes reviewed and incorporated, Labs reviewed, notes from prior ED visits and  Controlled Substance  Database   ____________________________________________   FINAL CLINICAL IMPRESSION(S) / ED DIAGNOSES  Final diagnoses:  Nausea  COVID-19      NEW MEDICATIONS STARTED DURING THIS VISIT:  New Prescriptions   ONDANSETRON (ZOFRAN) 4 MG TABLET    Take 1 tablet (4 mg total) by mouth daily as needed.   PREDNISONE (DELTASONE) 20 MG TABLET    Take 2 tablets (40 mg total) by mouth daily for 5 days.     Note:  This document was prepared using Dragon voice recognition software and may include unintentional dictation errors.    Willy Eddy, MD 11/15/20 1339

## 2020-11-16 ENCOUNTER — Telehealth: Payer: Self-pay | Admitting: Physician Assistant

## 2020-11-16 NOTE — Telephone Encounter (Signed)
Called to discuss with patient about Covid symptoms and the use of a monoclonal antibody infusion for those with mild to moderate Covid symptoms and at a high risk of hospitalization.   Pt is qualified for the monoclonal antibody infusion and oral antiviral but declined. Preventative practices reviewed. Patient verbalized understanding.  McKay, [place]  11/16/2020 11:48 AM

## 2020-11-18 ENCOUNTER — Ambulatory Visit: Payer: Self-pay | Admitting: *Deleted

## 2020-11-18 NOTE — Telephone Encounter (Signed)
Pt has an appt on Wednesday 11/20/20 with Sheliah Mends and he understands he will not get a work note until his appt

## 2020-11-18 NOTE — Telephone Encounter (Signed)
C/o lightheadedness and continued cough due to positive covid status since 11/15/20. Patient went to ED and positive for covid. C/o feeling lightheaded today and continued productive cough of green sputum. Denies chest pain, difficulty breathing. Hx COPD. Went to ED 11/15/20 and received antibiotics. Patient is scheduled to return to work tomorrow but now if feeling lightheaded. Last seen by Dr. Dorris Fetch 04/10/20. Patient reports he better with symptoms than last week but does not feel he can return to work at this time. Patient aware if symptoms worsen to go to UC or back to ED. Patient is requesting virtual visit. Patient has not been seen by another provider in office. Please advise. Contacted FC, no virtual visit available at this time. Patient does not have My Chart at this time to utilize and reports he has no internet to use in the future. Recommended to set up My Chart access if possible. Patient requesting if he can get a note for work, his emergency contact , Altair Appenzeller will be in office today seeing L. Tapia PA for OV.  Care advise given. Patient verbalized understanding of care advise.    Reason for Disposition . [1] HIGH RISK patient AND [2] influenza is widespread in the community AND [3] ONE OR MORE respiratory symptoms: cough, sore throat, runny or stuffy nose  Answer Assessment - Initial Assessment Questions 1. COVID-19 DIAGNOSIS: "Who made your COVID-19 diagnosis?" "Was it confirmed by a positive lab test or self-test?" If not diagnosed by a doctor (or NP/PA), ask "Are there lots of cases (community spread) where you live?" Note: See public health department website, if unsure.     Tested positive 11/15/20 in ED 2. COVID-19 EXPOSURE: "Was there any known exposure to COVID before the symptoms began?" CDC Definition of close contact: within 6 feet (2 meters) for a total of 15 minutes or more over a 24-hour period.      Na  3. ONSET: "When did the COVID-19 symptoms start?"      Prior  to 11/15/20 4. WORST SYMPTOM: "What is your worst symptom?" (e.g., cough, fever, shortness of breath, muscle aches)     Cough productive , lightheaded 5. COUGH: "Do you have a cough?" If Yes, ask: "How bad is the cough?"       Yes . Productive cough. Coughing up green sputum 6. FEVER: "Do you have a fever?" If Yes, ask: "What is your temperature, how was it measured, and when did it start?"     Denies  7. RESPIRATORY STATUS: "Describe your breathing?" (e.g., shortness of breath, wheezing, unable to speak)      Denies  8. BETTER-SAME-WORSE: "Are you getting better, staying the same or getting worse compared to yesterday?"  If getting worse, ask, "In what way?"     Better but still having symptoms 9. HIGH RISK DISEASE: "Do you have any chronic medical problems?" (e.g., asthma, heart or lung disease, weak immune system, obesity, etc.)     COPD 10. VACCINE: "Have you had the COVID-19 vaccine?" If Yes, ask: "Which one, how many shots, when did you get it?"       No  11. BOOSTER: "Have you received your COVID-19 booster?" If Yes, ask: "Which one and when did you get it?"       No  12. PREGNANCY: "Is there any chance you are pregnant?" "When was your last menstrual period?"       na 13. OTHER SYMPTOMS: "Do you have any other symptoms?"  (e.g., chills, fatigue, headache,  loss of smell or taste, muscle pain, sore throat)       Cough , lightheadedness 14. O2 SATURATION MONITOR:  "Do you use an oxygen saturation monitor (pulse oximeter) at home?" If Yes, ask "What is your reading (oxygen level) today?" "What is your usual oxygen saturation reading?" (e.g., 95%)       na  Protocols used: CORONAVIRUS (COVID-19) DIAGNOSED OR SUSPECTED-A-AH

## 2020-11-20 ENCOUNTER — Telehealth (INDEPENDENT_AMBULATORY_CARE_PROVIDER_SITE_OTHER): Payer: Managed Care, Other (non HMO) | Admitting: Family Medicine

## 2020-11-20 ENCOUNTER — Encounter: Payer: Self-pay | Admitting: Family Medicine

## 2020-11-20 VITALS — Ht 70.0 in | Wt 210.0 lb

## 2020-11-20 DIAGNOSIS — J069 Acute upper respiratory infection, unspecified: Secondary | ICD-10-CM

## 2020-11-20 DIAGNOSIS — U071 COVID-19: Secondary | ICD-10-CM

## 2020-11-20 NOTE — Progress Notes (Signed)
Name: Peter [name]   MRN: [id number]    DOB: Dec 15, 1965   Date:11/20/2020       Progress Note  Subjective:    Chief Complaint  Chief Complaint  Patient presents with  . Covid Positive    5/13 at ER, work note    I connected with  Octaviano Batty on 11/20/20 at  3:00 PM EDT by telephone and verified that I am speaking with the correct person using two identifiers.   I discussed the limitations, risks, security and privacy concerns of performing an evaluation and management service by telephone and the availability of in person appointments. Staff also discussed with the patient that there may be a patient responsible charge related to this service.  Patient verbalized understanding and agreed to proceed with encounter. Patient Location: home Provider Location: [facility] clinic Additional Individuals present: none  HPI  11/12/2020 sx onset, tested positive 5/13 and went to the ER and since then has been gradually improving since then.  He reports improvement in cough he no longer has productive sputum he denies any shortness of breath, myalgias, sweats, fever, body aches or significant fatigue.  He also endorses having return of his sense of smell and taste.  His work note from the ER excused him for 5 days but he did not feel well enough to return to work yesterday and now feels well enough and would like a note to clear him to return to work tomorrow and excuse him from his absence beyond what the ER documented.    Patient Active Problem List   Diagnosis Date Noted  . Chronic pain syndrome 11/04/2020  . Pharmacologic therapy 11/04/2020  . Disorder of skeletal system 11/04/2020  . Problems influencing health status 11/04/2020  . Abnormal MRI, thoracic spine (07/26/2020) 11/04/2020  . Abnormal MRI, lumbar spine (07/26/2020) 11/04/2020  . Abnormal MRI, cervical spine (01/11/2019) 11/04/2020  . Retrolisthesis (T12/L1) & (L1/L2) 11/04/2020  . Anterolisthesis of lumbosacral spine (L5/S1)  11/04/2020  . Cervical fusion syndrome (ACDF C3-7) 11/04/2020  . Chronic low back pain (1ry area of Pain) (Bilateral) (R>L) w/o sciatica 11/04/2020  . Chronic mid back pain (Bilateral) (R>L) 11/04/2020  . Cervicalgia 11/04/2020  . Chronic neck pain (3ry area of Pain) (Bilateral) (R>L) w/ history of cervical spinal surgery 11/04/2020  . Cervicogenic headaches (4th area of Pain) (Bilateral) (R>L) 11/04/2020  . Occipital headaches (Bilateral) (R>L) 11/04/2020  . Chronic upper extremity pain (5th area of Pain) (Bilateral) (R>L) 11/04/2020  . Chronic wrist pain (7th area of Pain) (Bilateral) (R>L) 11/04/2020  . Primary osteoarthritis involving multiple joints 06/24/2020  . Rheumatoid arthritis involving multiple sites with positive rheumatoid factor (HCC) 06/24/2020  . Postherpetic neuralgia 04/10/2020  . Herpes zoster without complication 04/04/2020  . Reactive depression 04/04/2020  . Chronic fatigue 02/02/2020  . S/P cervical spinal fusion 02/15/2019  . Preventative health care 09/08/2018  . Ventral hernia without obstruction or gangrene 09/08/2018  . History of colonoscopy with polypectomy 09/08/2018  . Hx of lower gastrointestinal bleeding 09/08/2018  . Obesity (BMI 30.0-34.9) 04/26/2018  . COPD (chronic obstructive pulmonary disease) (HCC) 04/26/2018  . Arthritis of right hand 04/26/2018  . Arthritis of left hand 04/26/2018  . Degenerative disc disease, lumbar 04/26/2018  . Chronic knee pain (6th area of Pain) (Bilateral) (R>L) 04/26/2018  . Dry skin 04/26/2018  . Tobacco dependence 08/20/2015  . Cervical radiculopathy 06/22/2015  . Displacement of intervertebral disc at C6-C7 level 06/22/2015  . Chronic shoulder pain (2ry area of  Pain) (Bilateral) (R>L) 06/22/2015    Social History   Tobacco Use  . Smoking status: Current Every Day Smoker    Packs/day: 1.00    Years: 41.00    Pack years: 41.00    Types: Cigarettes  . Smokeless tobacco: Never Used  Substance Use Topics  .  Alcohol use: No    Alcohol/week: 0.0 standard drinks     Current Outpatient Medications:  .  ciclopirox (LOPROX) 0.77 % cream, Apply topically as directed. Qd to bid to hands, feet, and around nails, Disp: 90 g, Rfl: 3 .  Fluticasone-Salmeterol (ADVAIR) 100-50 MCG/DOSE AEPB, Inhale 1 puff into the lungs 2 (two) times daily., Disp: , Rfl:  .  hydroxychloroquine (PLAQUENIL) 200 MG tablet, Take 200 mg by mouth 2 (two) times daily., Disp: , Rfl:  .  omeprazole (PRILOSEC OTC) 20 MG tablet, Take 20 mg by mouth every morning., Disp: , Rfl:  .  ondansetron (ZOFRAN) 4 MG tablet, Take 1 tablet (4 mg total) by mouth daily as needed., Disp: 14 tablet, Rfl: 0 .  predniSONE (DELTASONE) 20 MG tablet, Take 2 tablets (40 mg total) by mouth daily for 5 days., Disp: 10 tablet, Rfl: 0  No Known Allergies  Chart Review: I personally reviewed active problem list, medication list, allergies, family history, social history, health maintenance, notes from last encounter, lab results, imaging with the patient/caregiver today.   Review of Systems  Constitutional: Negative.  Negative for activity change, chills, diaphoresis, fatigue and fever.  HENT: Negative.   Eyes: Negative.   Respiratory: Negative.  Negative for cough, chest tightness, shortness of breath and wheezing.   Cardiovascular: Negative.  Negative for chest pain.  Gastrointestinal: Negative.   Endocrine: Negative.   Genitourinary: Negative.   Musculoskeletal: Negative.   Skin: Negative.   Allergic/Immunologic: Negative.   Neurological: Negative.   Hematological: Negative.   Psychiatric/Behavioral: Negative.   All other systems reviewed and are negative.    Objective:    Virtual encounter, vitals limited, only able to obtain the following Today's Vitals   11/20/20 1232  Weight: 210 lb (95.3 kg)  Height: 5\' 10"  (1.778 m)   Body mass index is 30.13 kg/m. Nursing Note and Vital Signs reviewed.  Physical Exam Vitals and nursing note  reviewed.  Pulmonary:     Effort: No respiratory distress.     Comments: Speaking in full and complete sentences, no audible wheeze, stridor or tachypnea no coughing throughout visit Neurological:     Mental Status: He is alert.  Psychiatric:        Mood and Affect: Mood normal.     PE limited by telephone encounter  No results found for this or any previous visit (from the past 72 hour(s)).  Assessment and Plan:     ICD-10-CM   1. Upper respiratory tract infection due to COVID-19 virus  U07.1    J06.9    onset of sx 5/10, ER visit and dx on 5/13, day 8 with improving sx, denies any fever, chills, SOB, cough CP fatigue, RTW note printed and signed for pt    -Red flags and when to present for emergency care or RTC including but not limited to new/worsening/un-resolving symptoms,  reviewed with patient at time of visit. Follow up and care instructions discussed and provided in AVS. - I discussed the assessment and treatment plan with the patient. The patient was provided an opportunity to ask questions and all were answered. The patient agreed with the plan and demonstrated an understanding  of the instructions.  - The patient was advised to call back or seek an in-person evaluation if the symptoms worsen or if the condition fails to improve as anticipated.  I provided 15 minutes of non-face-to-face time during this encounter.  Danelle Berry, PA-C 11/20/20 4:06 PM

## 2020-11-21 ENCOUNTER — Other Ambulatory Visit: Payer: Self-pay | Admitting: Pain Medicine

## 2020-11-21 ENCOUNTER — Telehealth: Payer: Managed Care, Other (non HMO) | Admitting: Unknown Physician Specialty

## 2020-11-21 NOTE — Progress Notes (Signed)
Specimen Collected: 11/04/20 13:40 Abnormal UDS: Drug Present not Declared for Prescription Verification   Carboxy-THC          90      UNEXPECTED ng/mg creat   Carboxy-THC is a metabolite of tetrahydrocannabinol (THC). Source of   THC is most commonly herbal marijuana or marijuana-based products,   but THC is also present in a scheduled prescription medication.   Trace amounts of THC can be present in hemp and cannabidiol (CBD)   products. This test is not intended to distinguish between delta-9-   tetrahydrocannabinol, the predominant form of THC in most herbal or   marijuana-based products, and delta-8-tetrahydrocannabinol.

## 2020-12-15 NOTE — Progress Notes (Deleted)
PROVIDER NOTE: Information contained herein reflects review and annotations entered in association with encounter. Interpretation of such information and data should be left to medically-trained personnel. Information provided to patient can be located elsewhere in the medical record under "Patient Instructions". Document created using STT-dictation technology, any transcriptional errors that may result from process are unintentional.    Patient: Peter [name]  Service Category: E/M  Provider: Gaspar Cola, MD  DOB: [birth year]  DOS: 12/18/2020  Specialty: Interventional Pain Management  MRN: [id number]  Setting: Ambulatory outpatient  PCP: Towanda Malkin, MD  Type: Established Patient    Referring Provider: Towanda Malkin*  Location: Office  Delivery: Face-to-face     Primary Reason(s) for Visit: Encounter for evaluation before starting new chronic pain management plan of care (Level of risk: moderate) CC: No chief complaint on file.  HPI  Peter [name] is a 55 y.o. year old, male patient, who comes today for a follow-up evaluation to review the test results and decide on a treatment plan. He has Cervical radiculopathy; Displacement of intervertebral disc at C6-C7 level; Chronic shoulder pain (2ry area of Pain) (Bilateral) (R>L); Tobacco dependence; Obesity (BMI 30.0-34.9); COPD (chronic obstructive pulmonary disease) ([place]); Arthritis of right hand; Arthritis of left hand; Degenerative disc disease, lumbar; Chronic knee pain (6th area of Pain) (Bilateral) (R>L); Dry skin; Preventative health care; Ventral hernia without obstruction or gangrene; History of colonoscopy with polypectomy; Hx of lower gastrointestinal bleeding; S/P cervical spinal fusion; Chronic fatigue; Herpes zoster without complication; Reactive depression; Postherpetic neuralgia; Primary osteoarthritis involving multiple joints; Rheumatoid arthritis involving multiple sites with positive rheumatoid factor ([place]);  Chronic pain syndrome; Pharmacologic therapy; Disorder of skeletal system; Problems influencing health status; Abnormal MRI, thoracic spine (07/26/2020); Abnormal MRI, lumbar spine (07/26/2020); Abnormal MRI, cervical spine (01/11/2019); Retrolisthesis (T12/L1) & (L1/L2); Anterolisthesis of lumbosacral spine (L5/S1); Cervical fusion syndrome (ACDF C3-7); Chronic low back pain (1ry area of Pain) (Bilateral) (R>L) w/o sciatica; Chronic mid back pain (Bilateral) (R>L); Cervicalgia; Chronic neck pain (3ry area of Pain) (Bilateral) (R>L) w/ history of cervical spinal surgery; Cervicogenic headaches (4th area of Pain) (Bilateral) (R>L); Occipital headaches (Bilateral) (R>L); Chronic upper extremity pain (5th area of Pain) (Bilateral) (R>L); and Chronic wrist pain (7th area of Pain) (Bilateral) (R>L) on their problem list. His primarily concern today is the No chief complaint on file.  Pain Assessment: Location:     Radiating:   Onset:   Duration:   Quality:   Severity:  /10 (subjective, self-reported pain score)  Effect on ADL:   Timing:   Modifying factors:   BP:    HR:    Peter [name] comes in today for a follow-up visit after his initial evaluation on 11/04/2020. Today we went over the results of his tests. These were explained in "Layman's terms". During today's appointment we went over my diagnostic impression, as well as the proposed treatment plan.   In considering the treatment plan options, Peter [name] was reminded that I no longer take patients for medication management only. I asked him to let me know if he had no intention of taking advantage of the interventional therapies, so that we could make arrangements to provide this space to someone interested. I also made it clear that undergoing interventional therapies for the purpose of getting pain medications is very inappropriate on the part of a patient, and it will not be tolerated in this practice. This type of behavior would suggest true  addiction and therefore it requires referral to an  addiction specialist.   Further details on both, my assessment(s), as well as the proposed treatment plan, please see below.  Controlled Substance Pharmacotherapy Assessment REMS (Risk Evaluation and Mitigation Strategy)  Analgesic: Hydrocodone/APAP 5/325 1 tab p.o. twice daily (10/01/2020) MME/day: 10 mg/day  Pill Count: None expected due to no prior prescriptions written by our practice. No notes on file Pharmacokinetics: Liberation and absorption (onset of action): WNL Distribution (time to peak effect): WNL Metabolism and excretion (duration of action): WNL         Pharmacodynamics: Desired effects: Analgesia: Peter [name] reports >50% benefit. Functional ability: Patient reports that medication allows him to accomplish basic ADLs Clinically meaningful improvement in function (CMIF): Sustained CMIF goals met Perceived effectiveness: Described as relatively effective, allowing for increase in activities of daily living (ADL) Undesirable effects: Side-effects or Adverse reactions: None reported Monitoring: [place] PMP: PDMP reviewed during this encounter. Online review of the past 25-monthperiod previously conducted. Not applicable at this point since we have not taken over the patient's medication management yet. List of other Serum/Urine Drug Screening Test(s):  No results found for: AMPHSCRSER, BARBSCRSER, BENZOSCRSER, COCAINSCRSER, COCAINSCRNUR, PCPSCRSER, THCSCRSER, THCU, CANNABQUANT, [place] E[place]key[place] of all UDS test(s) done:  Lab Results  Component Value Date   SUMMARY Note 11/04/2020   Last UDS on record: Summary  Date Value Ref Range Status  11/04/2020 Note  Final    Comment:    ==================================================================== Compliance Drug Analysis, Ur ==================================================================== Test                             Result       Flag        Units  Drug Present not Declared for Prescription Verification   Carboxy-THC                    90           UNEXPECTED ng/mg creat    Carboxy-THC is a metabolite of tetrahydrocannabinol (THC). Source of    THC is most commonly herbal marijuana or marijuana-based products,    but THC is also present in a scheduled prescription medication.    Trace amounts of THC can be present in hemp and cannabidiol (CBD)    products. This test is not intended to distinguish between delta-9-    tetrahydrocannabinol, the predominant form of THC in most herbal or    marijuana-based products, and delta-8-tetrahydrocannabinol.    Gabapentin                     PRESENT      UNEXPECTED   Salicylate                     PRESENT      UNEXPECTED ==================================================================== Test                      Result    Flag   Units      Ref Range   Creatinine              84               mg/dL      >=20 ==================================================================== Declared Medications:  The flagging and interpretation on this report are based on the  following declared medications.  Unexpected results may arise from  inaccuracies in the declared medications.   **Note: The testing scope  of this panel does not include the  following reported medications:   Ciclopirox (Loprox)  Fluticasone (Advair)  Hydroxychloroquine (Plaquenil)  Omeprazole (Prilosec)  Salmeterol (Advair) ==================================================================== For clinical consultation, please call [phone]. ====================================================================    UDS interpretation: No unexpected findings.          Medication Assessment Form: Patient introduced to form today Treatment compliance: Treatment may start today if patient agrees with proposed plan. Evaluation of compliance is not applicable at this point Risk Assessment Profile: Aberrant behavior:  See initial evaluations. None observed or detected today Comorbid factors increasing risk of overdose: See initial evaluation. No additional risks detected today Opioid risk tool (ORT):  Opioid Risk  11/04/2020  Alcohol 3  Illegal Drugs 3  Rx Drugs 0  Alcohol 3  Illegal Drugs 4  Rx Drugs 0  Age between 16-45 years  0  History of Preadolescent Sexual Abuse 0  Psychological Disease 0  Depression 0  Opioid Risk Tool Scoring 13  Opioid Risk Interpretation High Risk    ORT Scoring interpretation table:  Score <3 = Low Risk for SUD  Score between 4-7 = Moderate Risk for SUD  Score >8 = High Risk for Opioid Abuse   Risk of substance use disorder (SUD): Low  Risk Mitigation Strategies:  Patient opioid safety counseling: Completed today. Counseling provided to patient as per "Patient Counseling Document". Document signed by patient, attesting to counseling and understanding Patient-Prescriber Agreement (PPA): Obtained today.  Controlled substance notification to other providers: Written and sent today.  Pharmacologic Plan: Today we may be taking over the patient's pharmacological regimen. See below.             Laboratory Chemistry Profile   Renal Lab Results  Component Value Date   BUN 11 11/15/2020   CREATININE 0.94 11/15/2020   BCR 7 (L) 11/04/2020   GFRAA 92 02/02/2020   GFRNONAA >60 11/15/2020   PROTEINUR NEGATIVE 02/10/2019     Electrolytes Lab Results  Component Value Date   NA 141 11/15/2020   K 3.8 11/15/2020   CL 106 11/15/2020   CALCIUM 8.9 11/15/2020   MG 2.1 11/04/2020   PHOS 4.2 09/22/2015     Hepatic Lab Results  Component Value Date   AST 24 11/15/2020   ALT 22 11/15/2020   ALBUMIN 4.5 11/15/2020   ALKPHOS 42 11/15/2020   LIPASE 34 12/31/2014     ID Lab Results  Component Value Date   HIV NON-REACTIVE 04/26/2018   SARSCOV2NAA POSITIVE (A) 11/15/2020   STAPHAUREUS POSITIVE (A) 02/10/2019   MRSAPCR POSITIVE (A) 02/10/2019     Bone Lab  Results  Component Value Date   25OHVITD1 22 (L) 11/04/2020   25OHVITD2 <1.0 11/04/2020   25OHVITD3 22 11/04/2020     Endocrine Lab Results  Component Value Date   GLUCOSE 89 11/15/2020   GLUCOSEU NEGATIVE 02/10/2019   TSH 1.07 02/02/2020     Neuropathy Lab Results  Component Value Date   VITAMINB12 331 11/04/2020   HIV NON-REACTIVE 04/26/2018     CNS No results found for: COLORCSF, APPEARCSF, RBCCOUNTCSF, WBCCSF, POLYSCSF, LYMPHSCSF, EOSCSF, PROTEINCSF, GLUCCSF, JCVIRUS, CSFOLI, IGGCSF, LABACHR, ACETBL, LABACHR, ACETBL   Inflammation (CRP: Acute  ESR: Chronic) Lab Results  Component Value Date   CRP <1 11/04/2020   ESRSEDRATE 36 (H) 11/04/2020     Rheumatology Lab Results  Component Value Date   RF 17 (H) 02/02/2020   ANA NEGATIVE 02/02/2020     Coagulation Lab Results  Component Value Date  INR 0.9 02/10/2019   LABPROT 12.5 02/10/2019   APTT 27 02/10/2019   PLT 189 11/15/2020     Cardiovascular Lab Results  Component Value Date   HGB 14.8 11/15/2020   HCT 44.6 11/15/2020     Screening Lab Results  Component Value Date   SARSCOV2NAA POSITIVE (A) 11/15/2020   STAPHAUREUS POSITIVE (A) 02/10/2019   MRSAPCR POSITIVE (A) 02/10/2019   HIV NON-REACTIVE 04/26/2018     Cancer No results found for: CEA, CA125, LABCA2   Allergens No results found for: ALMOND, APPLE, ASPARAGUS, AVOCADO, BANANA, BARLEY, BASIL, BAYLEAF, GREENBEAN, LIMABEAN, WHITEBEAN, BEEFIGE, REDBEET, BLUEBERRY, BROCCOLI, CABBAGE, MELON, CARROT, CASEIN, CASHEWNUT, CAULIFLOWER, CELERY     Note: Lab results reviewed.  Recent Diagnostic Imaging Review  Cervical Imaging: Cervical MR wo contrast: Results for orders placed during the hospital encounter of 01/11/19  MR Cervical Spine Wo Contrast  Narrative CLINICAL DATA:  Initial evaluation for intermittent bilateral arm numbness and weakness for 1 week, headaches.  EXAM: MRI HEAD WITHOUT CONTRAST  MRI CERVICAL SPINE WITHOUT  CONTRAST  TECHNIQUE: Multiplanar, multiecho pulse sequences of the brain and surrounding structures, and cervical spine, to include the craniocervical junction and cervicothoracic junction, were obtained without intravenous contrast.  COMPARISON:  Prior MRI from 04/26/2015.  FINDINGS: MRI HEAD FINDINGS  Brain: Cerebral volume within normal limits for patient age. Few scattered subcentimeter T2/FLAIR hyperintensities noted within the periventricular and deep white matter both cerebral hemispheres, nonspecific, but felt to be within normal limits for age.  No abnormal foci of restricted diffusion to suggest acute or subacute ischemia. Gray-white matter differentiation well maintained. No encephalomalacia to suggest chronic infarction. No foci of susceptibility artifact to suggest acute or chronic intracranial hemorrhage.  No mass lesion, midline shift or mass effect. No hydrocephalus. No extra-axial fluid collection. Major dural sinuses are grossly patent.  Pituitary gland and suprasellar region are normal. Midline structures intact and normal.  Vascular: Major intracranial vascular flow voids well maintained and normal in appearance.  Skull and upper cervical spine: Craniocervical junction normal. Visualized upper cervical spine within normal limits. Bone marrow signal intensity normal. No scalp soft tissue abnormality.  Sinuses/Orbits: Globes and orbital soft tissues within normal limits.  Paranasal sinuses are clear. Trace left mastoid effusion noted, of doubtful significance. Inner ear structures normal.  Other: None.  MRI CERVICAL SPINE FINDINGS  Alignment: Straightening of the normal cervical lordosis. No listhesis.  Vertebrae: Vertebral body height maintained without evidence for acute or chronic fracture. Bone marrow signal intensity within normal limits. No discrete or worrisome osseous lesions. No abnormal marrow edema.  Cord: Signal intensity within  the cervical spinal cord is normal.  Posterior Fossa, vertebral arteries, paraspinal tissues: Craniocervical junction within normal limits. Paraspinous and prevertebral soft tissues within normal limits. Normal intravascular flow voids seen within the vertebral arteries bilaterally.  Disc levels:  C2-C3: Mild right-sided uncovertebral hypertrophy without significant disc bulge. Advanced right-sided facet degeneration. Resultant moderate right C3 foraminal stenosis. No significant canal or left foraminal narrowing.  C3-C4: Diffuse degenerative disc osteophyte with intervertebral disc space narrowing. Broad posterior component with superimposed prominent central soft disc protrusion indents and effaces the ventral thecal sac. Secondary impingement and flattening of the cervical spinal cord without cord signal changes. Resultant severe spinal stenosis. Thecal sac measures 5 mm in AP diameter at its most narrow point. Associated severe left greater than right C4 foraminal stenosis.  C4-C5: Circumferential disc osteophyte with intervertebral disc space narrowing. Broad posterior component effaces the ventral thecal sac and resultant  moderate to severe spinal stenosis. Mild cord flattening without cord signal changes. Thecal sac measures 7 mm in AP diameter. Moderate left worse than right C5 foraminal stenosis.  C5-C6: Circumferential disc osteophyte with intervertebral disc space narrowing. Broad posterior component slightly asymmetric to the right effaces the ventral thecal sac. Resultant severe spinal stenosis with associated cord flattening. No cord signal changes. Thecal sac measures 6 mm in AP diameter. Severe right worse than left C6 foraminal stenosis.  C6-C7: Diffuse disc osteophyte with intervertebral disc space narrowing. Right worse than left uncovertebral spurring. Broad posterior component effaces the ventral thecal sac resultant moderate to severe spinal stenosis.  Mild cord flattening without cord signal changes. Thecal sac measures 6 mm in AP diameter at its most narrow point. Severe right worse than left C7 foraminal stenosis.  C7-T1: Negative interspace. Mild facet hypertrophy. No significant canal or foraminal stenosis.  Visualized upper thoracic spine demonstrates mild disc bulging at T1-2 and T2-3 without significant stenosis.  IMPRESSION: MRI HEAD IMPRESSION:  Normal brain MRI for age. No acute intracranial abnormality identified.  MRI CERVICAL SPINE IMPRESSION:  1. Advanced multilevel cervical spondylolysis at C3-4 through C6-7 with resultant moderate to severe diffuse spinal stenosis. Secondary cord flattening and impingement at multiple levels without cord signal changes. 2. Multifactorial degenerative changes with resultant multilevel foraminal narrowing as above. Notable findings include moderate right C3 foraminal stenosis, severe bilateral C4 foraminal narrowing, moderate bilateral C5 foraminal stenosis, with severe bilateral C6 and C7 foraminal stenosis.   Electronically Signed By: Jeannine Boga M.D. On: 01/11/2019 23:59  Cervical MR wo contrast: No valid procedures specified. Cervical MR w/wo contrast: No results found for this or any previous visit.  Cervical CT wo contrast: No results found for this or any previous visit.  Cervical CT outside: No results found for this or any previous visit.  Cervical DG F/E views: No results found for this or any previous visit.  Cervical DG Bending/F/E views: Results for orders placed during the hospital encounter of 11/04/20  DG Cervical Spine With Flex & Extend  Narrative CLINICAL DATA:  Cervicalgia  EXAM: CERVICAL SPINE COMPLETE WITH FLEXION AND EXTENSION VIEWS  COMPARISON:  02/16/2019  FINDINGS: No fracture or static subluxation of the cervical spine. Redemonstrated anterior cervical discectomy and fusion of C3 through C7, with interval bony incorporation  of the disc spaces. There is essentially no excursion on flexion or extension as expected with this degree of cervical fusion. No listhesis. There is at least mild bilateral bony neural foraminal stenosis at multiple cervical levels secondary to uncovertebral change, particularly C5-C6 and C6-C7. The partially imaged skull base, cervical soft tissues, and upper chest are unremarkable.  IMPRESSION: 1.  No fracture or static subluxation of the cervical spine.  2. Redemonstrated anterior cervical discectomy and fusion of C3 through C7, with interval bony incorporation of the disc spaces.  3. There is essentially no excursion on flexion or extension as expected with this degree of cervical fusion. No listhesis.  4. There is at least mild bilateral bony neural foraminal stenosis at multiple cervical levels secondary to uncovertebral change, particularly C5-C6 and C6-C7.   Electronically Signed By: Eddie Candle M.D. On: 11/05/2020 14:26   Shoulder Imaging: Shoulder-R MR w/wo contrast: No results found for this or any previous visit.  Shoulder-L MR w/wo contrast: No results found for this or any previous visit.  Shoulder-R MR wo contrast: No results found for this or any previous visit.  Shoulder-L MR wo contrast: No results found  for this or any previous visit.  Shoulder-R DG: Results for orders placed during the hospital encounter of 11/04/20  DG Shoulder Right  Narrative CLINICAL DATA:  Right shoulder pain  EXAM: RIGHT SHOULDER - 2+ VIEW  COMPARISON:  None.  FINDINGS: There is no evidence of fracture or dislocation. Mild arthropathy of the [facility] joint. Glenohumeral joint appears within normal limits. Soft tissues are unremarkable. Partially visualized cervical fusion hardware.  IMPRESSION: Mild arthropathy of the right AC joint.   Electronically Signed By: Davina Poke D.O. On: 11/05/2020 14:22  Shoulder-L DG: Results for orders placed during the hospital  encounter of 11/04/20  DG Shoulder Left  Narrative CLINICAL DATA:  Chronic shoulder pain  EXAM: LEFT SHOULDER - 2+ VIEW  COMPARISON:  None.  FINDINGS: There is no evidence of fracture or dislocation. Moderate acromioclavicular arthrosis. The glenohumeral joint is intact. Soft tissues are unremarkable.  IMPRESSION: 1.  No fracture or dislocation of the left shoulder.  2. Moderate acromioclavicular arthrosis. The glenohumeral joint is intact.   Electronically Signed By: Eddie Candle M.D. On: 11/05/2020 14:22   Thoracic Imaging: Thoracic MR wo contrast: Results for orders placed during the hospital encounter of 07/25/20  MR THORACIC SPINE WO CONTRAST  Narrative CLINICAL DATA:  Initial evaluation for chronic back pain with radiation into the lower extremities and thighs bilaterally.  EXAM: MRI THORACIC SPINE WITHOUT CONTRAST  TECHNIQUE: Multiplanar, multisequence MR imaging of the thoracic spine was performed. No intravenous contrast was administered.  COMPARISON:  None available.  FINDINGS: Alignment: Straightening of the normal upper and midthoracic kyphosis, with slight exaggeration of the kyphotic curvature at the thoracolumbar junction. 4 mm retrolisthesis of T12 on L1. No other listhesis or static subluxation.  Vertebrae: Vertebral body height maintained without acute or chronic fracture. Bone marrow signal intensity within normal limits. No worrisome osseous lesions. Discogenic reactive endplate change with associated marrow edema present about the T11-12 and T12-L1 interspaces. More mild changes noted at T3-4 as well. No other abnormal marrow edema.  Cord:  Normal signal and morphology.  Paraspinal and other soft tissues: Paraspinous soft tissues within normal limits. Partially visualized lungs are largely clear. Visualized visceral structures are normal.  Disc levels:  T1-2: Minimal disc bulge. No spinal stenosis. Foramina  remain patent.  T2-3: Mild disc bulge.  No spinal stenosis.  Foramina remain patent.  T3-4: Diffuse disc bulge with intervertebral disc space narrowing and reactive endplate change. Mild facet hypertrophy. No significant spinal stenosis. Mild to moderate right foraminal narrowing. Left neural foramina remains patent.  T4-5: Mild disc bulge with disc desiccation and intervertebral disc space narrowing. Mild facet hypertrophy. No spinal stenosis. Mild right foraminal narrowing. Left neural foramina remains patent.  T5-6: Broad-based right paracentral disc protrusion flattens and partially effaces the ventral thecal sac (series 22, image 17). Secondary flattening of the right hemi cord without cord signal changes. Mild facet hypertrophy. No significant spinal stenosis. Foramina remain patent.  T6-7: Right paracentral disc protrusion indents the right ventral thecal sac and contacts the right ventral cord (series 21, image 20). Flattening of the right hemi cord without cord signal changes or significant spinal stenosis. Foramina remain patent.  T7-8: Broad-based right paracentral disc protrusion flattens and effaces the ventral thecal sac. Mild cord flattening without cord signal changes or significant spinal stenosis. Superimposed bilateral facet hypertrophy. Foramina remain patent.  T8-9: Diffuse disc bulge. Lobulated broad-based posterior disc protrusion indents and partially effaces the ventral thecal sac, greater on the left (series 21, image 26).  Mild flattening of the ventral cord without cord signal changes. Mild facet hypertrophy. No significant spinal stenosis. Foramina remain patent.  T9-10: Disc desiccation without significant disc bulge. Posterior element hypertrophy. No stenosis.  T10-11: Disc desiccation without significant disc bulge. No stenosis.  T11-12: Degenerative intervertebral disc space narrowing with mild disc bulge. Prominent reactive endplate change  with associated marrow edema. Mild left greater than right facet hypertrophy. No spinal stenosis. Mild bilateral foraminal narrowing, greater on the left.  T12-L1: Retrolisthesis. Degenerative intervertebral disc space narrowing with diffuse disc bulge and disc desiccation. Prominent reactive endplate change with associated marrow edema. No spinal stenosis. Mild bilateral foraminal narrowing.  IMPRESSION: 1. Multilevel thoracic spondylosis with multifocal disc protrusions at T3-4 through T8-9 as above. No significant spinal stenosis. 2. Mild to moderate right foraminal narrowing at T3-4 and mild bilateral foraminal stenosis at T11-12 and T12-L1 related to disc bulge and facet degeneration. 3. Prominent reactive endplate change with associated marrow edema about the T11-12 and T12-L1 interspaces. Finding could serve as a source for back pain.   Electronically Signed By: Jeannine Boga M.D. On: 07/26/2020 03:36  Thoracic MR wo contrast: No valid procedures specified. Thoracic MR w/wo contrast: No results found for this or any previous visit.  Thoracic CT wo contrast: No results found for this or any previous visit.  Thoracic CT w/wo contrast: No results found for this or any previous visit.  Thoracic CT w/wo contrast: No results found for this or any previous visit.  Thoracic DG 4 views: No results found for this or any previous visit.  Thoracic DG: No results found for this or any previous visit.  Thoracic DG w/swimmers view: No results found for this or any previous visit.   Lumbosacral Imaging: Lumbar MR wo contrast: Results for orders placed during the hospital encounter of 07/25/20  MR LUMBAR SPINE WO CONTRAST  Narrative CLINICAL DATA:  Initial evaluation for chronic lower back pain with radiation into both lower extremities.  EXAM: MRI LUMBAR SPINE WITHOUT CONTRAST  TECHNIQUE: Multiplanar, multisequence MR imaging of the lumbar spine was performed. No  intravenous contrast was administered.  COMPARISON:  Previous radiograph from 03/04/2011.  FINDINGS: Segmentation: Standard. Lowest well-formed disc space labeled the L5-S1 level.  Alignment: 4 mm retrolisthesis of T12 on L1. Trace retrolisthesis of L1 on L2, with trace anterolisthesis of L5 on S1. Findings chronic and facet mediated.  Vertebrae: Vertebral body height maintained without acute or chronic fracture. Underlying bone marrow signal intensity within normal limits. No worrisome osseous lesions. Prominent discogenic reactive endplate change with associated marrow edema noted about the T11-12 and T12-L1 interspaces. No other abnormal marrow edema.  Conus medullaris and cauda equina: Conus extends to the L1 level. Conus and cauda equina appear normal.  Paraspinal and other soft tissues: Paraspinous soft tissues within normal limits. 1.5 cm simple cyst noted within the interpolar right kidney. Partially visualized cystic structure at the right lower quadrant favored to reflect a portion of the cecum. Visualized visceral structures otherwise unremarkable.  Disc levels:  L1-2: Diffuse disc bulge with disc desiccation. Mild facet hypertrophy. No significant spinal stenosis. Foramina remain patent.  L2-3: Diffuse disc bulge with disc desiccation. Superimposed right subarticular to extraforaminal disc protrusion (series 27, image 17). Associated slight inferior migration at the level of the right lateral recess (series 23, image 8). Annular fissure. Mild facet hypertrophy. Resultant mild canal with bilateral lateral recess stenosis. Mild bilateral L2 foraminal narrowing.  L3-4: Diffuse disc bulge with disc desiccation and intervertebral disc  space narrowing. Mild facet and ligament flavum hypertrophy. Resultant moderate canal with moderate bilateral L3 foraminal stenosis.  L4-5: Diffuse disc bulge with disc desiccation. Superimposed shallow central disc protrusion with  annular fissure. Mild left worse than right facet hypertrophy. Resultant mild canal with mild left lateral recess stenosis. Mild to moderate right with moderate left L4 foraminal narrowing.  L5-S1: Trace anterolisthesis. Broad-based right foraminal disc protrusion with associated annular fissure (series 26, image 32). Severe right worse than left facet degeneration. No spinal stenosis. Moderate right with mild left L5 foraminal narrowing.  IMPRESSION: 1. Disc bulge with facet hypertrophy at L3-4 with resultant moderate canal and moderate bilateral L3 foraminal stenosis. 2. Broad-based right foraminal disc protrusion at L5-S1 with resultant moderate right L5 foraminal stenosis. Disc bulging with facet hypertrophy at L4-5 with resultant mild left lateral recess stenosis, with mild to moderate left worse than right L4 foraminal narrowing. 3. Prominent discogenic reactive endplate change with associated marrow edema about the T11-12 and T12-L1 interspaces. Finding could contribute to underlying back pain. Could contribute to underlying back pain.   Electronically Signed By: Jeannine Boga M.D. On: 07/26/2020 03:49  Lumbar MR wo contrast: No valid procedures specified. Lumbar MR w/wo contrast: No results found for this or any previous visit.  Lumbar CT wo contrast: No results found for this or any previous visit.  Lumbar CT w/wo contrast: No results found for this or any previous visit.  Lumbar CT w/wo contrast: No results found for this or any previous visit.  Lumbar DG (Complete) 4+V: No results found for this or any previous visit.        Lumbar DG F/E views: No results found for this or any previous visit.        Lumbar DG Bending views: Results for orders placed during the hospital encounter of 11/04/20  DG Lumbar Spine Complete W/Bend  Narrative CLINICAL DATA:  Chronic low back pain  EXAM: LUMBAR SPINE - COMPLETE WITH BENDING VIEWS  COMPARISON:   None.  FINDINGS: No fracture or dislocation of the lumbar spine. There is a somewhat exaggerated lumbar lordosis with focally moderate disc space height loss and thoracolumbar kyphosis at T11 through L1. There is mild lumbar disc space height loss and osteophytosis and mild multilevel facet degenerative change. Limited excursion on flexion and extension views without appreciable listhesis. Aortic atherosclerosis. Nonobstructive pattern of overlying bowel gas.  IMPRESSION: 1. No fracture or dislocation of the lumbar spine. 2. There is a somewhat exaggerated lumbar lordosis with focally moderate disc space height loss and thoracolumbar kyphosis at T11 through L1. 3. There is otherwise mild lumbar disc space height loss and osteophytosis and mild multilevel facet degenerative change. 4. Limited excursion on flexion and extension views without appreciable listhesis.   Electronically Signed By: Eddie Candle M.D. On: 11/05/2020 14:32        Lumbar DG Myelogram views: No results found for this or any previous visit.  Lumbar DG Myelogram: No results found for this or any previous visit.  Lumbar DG Myelogram: No results found for this or any previous visit.  Lumbar DG Myelogram: No results found for this or any previous visit.  Lumbar DG Myelogram Lumbosacral: No results found for this or any previous visit.   Sacroiliac Joint Imaging: Sacroiliac Joint DG: No results found for this or any previous visit.  Sacroiliac Joint MR w/wo contrast: No results found for this or any previous visit.  Sacroiliac Joint MR wo contrast: No results found for this or  any previous visit.   Spine Imaging: MRA Spinal Canal w/ cm: No results found for this or any previous visit.  MRA Spinal Canal wo/ cm: No valid procedures specified. MRA Spinal Canal w/wo cm: No results found for this or any previous visit.   Hip Imaging: Hip-R MR w/wo contrast: No results found for this or any previous  visit.  Hip-L MR w/wo contrast: No results found for this or any previous visit.  Hip-R MR wo contrast: No results found for this or any previous visit.  Hip-L MR wo contrast: No results found for this or any previous visit.  Hip-R CT w/wo contrast: No results found for this or any previous visit.  Hip-L CT w/wo contrast: No results found for this or any previous visit.  Hip-R CT wo contrast: No results found for this or any previous visit.  Hip-L CT wo contrast: No results found for this or any previous visit.  Hip-R DG 2-3 views: No results found for this or any previous visit.  Hip-L DG 2-3 views: No results found for this or any previous visit.  Hip-B DG Bilateral: No results found for this or any previous visit.   Knee Imaging: Knee-R MR w/wo contrast: No results found for this or any previous visit.  Knee-L MR w/wo contrast: No results found for this or any previous visit.  Knee-R MR wo contrast: No results found for this or any previous visit.  Knee-L MR wo contrast: No results found for this or any previous visit.  Knee-R CT w/wo contrast: No results found for this or any previous visit.  Knee-L CT w/wo contrast: No results found for this or any previous visit.  Knee-R CT wo contrast: No results found for this or any previous visit.  Knee-L CT wo contrast: No results found for this or any previous visit.  Knee-R DG 4 views: No results found for this or any previous visit.  Knee-L DG 4 views: No results found for this or any previous visit.   Ankle Imaging: Ankle-R DG Complete: No results found for this or any previous visit.  Ankle-L DG Complete: No results found for this or any previous visit.   Foot Imaging: Foot-R DG Complete: No results found for this or any previous visit.  Foot-L DG Complete: No results found for this or any previous visit.   Elbow Imaging: Elbow-R DG Complete: No results found for this or any previous visit.  Elbow-L DG  Complete: No results found for this or any previous visit.   Wrist Imaging: Wrist-R DG Complete: Results for orders placed during the hospital encounter of 11/04/20  DG Wrist Complete Right  Narrative CLINICAL DATA:  Chronic bilateral wrist pain  EXAM: LEFT WRIST - COMPLETE 3+ VIEW; RIGHT WRIST - COMPLETE 3+ VIEW  COMPARISON:  None.  FINDINGS: There is no evidence of fracture or dislocation. Minimal carpal and radiocarpal arthrosis bilaterally. Soft tissues are unremarkable.  IMPRESSION: 1. No fracture or dislocation of the bilateral wrists. The carpi are normally aligned.  2.  Minimal carpal and radiocarpal arthrosis bilaterally.   Electronically Signed By: Eddie Candle M.D. On: 11/05/2020 14:42  Wrist-L DG Complete: Results for orders placed during the hospital encounter of 11/04/20  DG Wrist Complete Left  Narrative CLINICAL DATA:  Chronic bilateral wrist pain  EXAM: LEFT WRIST - COMPLETE 3+ VIEW; RIGHT WRIST - COMPLETE 3+ VIEW  COMPARISON:  None.  FINDINGS: There is no evidence of fracture or dislocation. Minimal carpal and radiocarpal arthrosis  bilaterally. Soft tissues are unremarkable.  IMPRESSION: 1. No fracture or dislocation of the bilateral wrists. The carpi are normally aligned.  2.  Minimal carpal and radiocarpal arthrosis bilaterally.   Electronically Signed By: Eddie Candle M.D. On: 11/05/2020 14:42   Hand Imaging: Hand-R DG Complete: Results for orders placed during the hospital encounter of 04/26/18  DG Hand Complete Right  Narrative CLINICAL DATA:  Right greater than left hand pain and swelling in the region of the MCP joints for 5 years.  EXAM: RIGHT HAND-COMPLETE 3+ VIEW  COMPARISON:  None.  FINDINGS: No acute fracture or dislocation is identified. Mild to moderate degenerative marginal osteophyte formation is noted at multiple PIP and DIP joints, most notably the index and long fingers. No osseous erosion or soft tissue  swelling is identified.  IMPRESSION: Mild to moderate osteoarthrosis in the IP joints without evidence of erosive arthropathy or acute osseous abnormality.   Electronically Signed By: Logan Bores M.D. On: 04/26/2018 16:17  Hand-L DG Complete: Results for orders placed during the hospital encounter of 04/26/18  DG Hand Complete Left  Narrative CLINICAL DATA:  Right greater than left hand pain and swelling in the region of the MCP joints for 5 years.  EXAM: LEFT HAND - COMPLETE 3+ VIEW  COMPARISON:  None.  FINDINGS: A mild angular deformity of the fifth metacarpal likely represents an old, healed fracture. No acute fracture or dislocation is identified. Mild degenerative marginal osteophyte formation is noted at multiple PIP and DIP joints in the fingers. No osseous erosion or soft tissue swelling is identified.  IMPRESSION: Mild osteoarthrosis in the IP joints without evidence of erosive arthropathy or acute osseous abnormality.   Electronically Signed By: Logan Bores M.D. On: 04/26/2018 16:15   Complexity Note: Imaging results reviewed. Results shared with Peter [name], using Layman's terms.                         Meds   Current Outpatient Medications:    ciclopirox (LOPROX) 0.77 % cream, Apply topically as directed. Qd to bid to hands, feet, and around nails, Disp: 90 g, Rfl: 3   Fluticasone-Salmeterol (ADVAIR) 100-50 MCG/DOSE AEPB, Inhale 1 puff into the lungs 2 (two) times daily., Disp: , Rfl:    hydroxychloroquine (PLAQUENIL) 200 MG tablet, Take 200 mg by mouth 2 (two) times daily., Disp: , Rfl:    omeprazole (PRILOSEC OTC) 20 MG tablet, Take 20 mg by mouth every morning., Disp: , Rfl:    ondansetron (ZOFRAN) 4 MG tablet, Take 1 tablet (4 mg total) by mouth daily as needed., Disp: 14 tablet, Rfl: 0  ROS  Constitutional: Denies any fever or chills Gastrointestinal: No reported hemesis, hematochezia, vomiting, or acute GI distress Musculoskeletal: Denies  any acute onset joint swelling, redness, loss of ROM, or weakness Neurological: No reported episodes of acute onset apraxia, aphasia, dysarthria, agnosia, amnesia, paralysis, loss of coordination, or loss of consciousness  Allergies  Peter [name] has No Known Allergies.  PFSH  Drug: Peter [name]  reports no history of drug use. Alcohol:  reports no history of alcohol use. Tobacco:  reports that he has been smoking cigarettes. He has a 41.00 pack-year smoking history. He has never used smokeless tobacco. Medical:  has a past medical history of Arthritis, COPD (chronic obstructive pulmonary disease) ([place]), Diverticulitis, Generalized headaches, GERD (gastroesophageal reflux disease), History of colonoscopy with polypectomy (09/08/2018), Lower back pain, Rectal pain, and Thrombosed hemorrhoids. Surgical: Peter [name]  has  a past surgical history that includes Cholecystectomy (1989); Tonsillectomy (1970); Appendectomy (1988); Colonoscopy with propofol (N/A, 08/16/2015); polypectomy (08/16/2015); Laparoscopic partial colectomy (N/A, 09/11/2015); Cystoscopy with stent placement (Bilateral, 09/11/2015); Bladder repair (N/A, 09/11/2015); Ileostomy closure (N/A, 11/19/2015); Colon surgery; and Anterior cervical decompression/discectomy fusion 4 level (N/A, 02/15/2019). Family: family history includes Cholecystitis in his mother; Diabetes in his brother and paternal grandmother; Emphysema in his paternal grandmother; Hypertension in his brother; Lung cancer in his father; Prostate cancer in his maternal uncle.  Constitutional Exam  General appearance: Well nourished, well developed, and well hydrated. In no apparent acute distress There were no vitals filed for this visit. BMI Assessment: Estimated body mass index is 30.13 kg/m as calculated from the following:   Height as of 11/20/20: '5\' 10"'  (1.778 m).   Weight as of 11/20/20: 210 lb (95.3 kg).  BMI interpretation table: BMI level Category Range association with higher  incidence of chronic pain  <18 kg/m2 Underweight   18.5-24.9 kg/m2 Ideal body weight   25-29.9 kg/m2 Overweight Increased incidence by 20%  30-34.9 kg/m2 Obese (Class I) Increased incidence by 68%  35-39.9 kg/m2 Severe obesity (Class II) Increased incidence by 136%  >40 kg/m2 Extreme obesity (Class III) Increased incidence by 254%   Patient's current BMI Ideal Body weight  There is no height or weight on file to calculate BMI. Patient weight not recorded   BMI Readings from Last 4 Encounters:  11/20/20 30.13 kg/m  11/15/20 33.45 kg/m  11/04/20 33.45 kg/m  04/10/20 31.26 kg/m   Wt Readings from Last 4 Encounters:  11/20/20 210 lb (95.3 kg)  11/15/20 220 lb (99.8 kg)  11/04/20 220 lb (99.8 kg)  04/10/20 205 lb 9.6 oz (93.3 kg)    Psych/Mental status: Alert, oriented x 3 (person, place, & time)       Eyes: PERLA Respiratory: No evidence of acute respiratory distress  Assessment & Plan  Primary Diagnosis & Pertinent Problem List: There were no encounter diagnoses.  Visit Diagnosis: No diagnosis found. Problems updated and reviewed during this visit: No problems updated.  Plan of Care  Pharmacotherapy (Medications Ordered): No orders of the defined types were placed in this encounter.  Procedure Orders    No procedure(s) ordered today   Lab Orders  No laboratory test(s) ordered today   Imaging Orders  No imaging studies ordered today   Referral Orders  No referral(s) requested today    Pharmacological management options:  Opioid Analgesics: We'll take over management today. See above orders Membrane stabilizer: Options discussed, including a trial. Muscle relaxant: We have discussed the possibility of a trial NSAID: Trial discussed. Other analgesic(s): To be determined at a later time    Interventional management options: Planned, scheduled, and/or pending:      Considering:     PRN Procedures:   None at this time    Provider-requested follow-up: No  follow-ups on file. Recent Visits Date Type Provider Dept  11/04/20 Office Visit Milinda Pointer, MD [facility]  Showing recent visits within past 90 days and meeting all other requirements Future Appointments Date Type Provider Dept  12/18/20 Appointment Milinda Pointer, MD [facility]  Showing future appointments within next 90 days and meeting all other requirements Primary Care Physician: Towanda Malkin, MD Note by: Gaspar Cola, MD Date: 12/18/2020; Time: 4:32 PM

## 2020-12-18 ENCOUNTER — Ambulatory Visit: Payer: BC Managed Care – PPO | Admitting: Pain Medicine

## 2020-12-28 ENCOUNTER — Other Ambulatory Visit: Payer: Self-pay | Admitting: Dermatology

## 2020-12-28 DIAGNOSIS — B351 Tinea unguium: Secondary | ICD-10-CM

## 2020-12-31 ENCOUNTER — Ambulatory Visit: Payer: BC Managed Care – PPO | Admitting: Dermatology

## 2020-12-31 ENCOUNTER — Other Ambulatory Visit: Payer: Self-pay

## 2020-12-31 DIAGNOSIS — B352 Tinea manuum: Secondary | ICD-10-CM

## 2020-12-31 DIAGNOSIS — L409 Psoriasis, unspecified: Secondary | ICD-10-CM | POA: Diagnosis not present

## 2020-12-31 MED ORDER — TERBINAFINE HCL 250 MG PO TABS: 250.0000 mg | ORAL_TABLET | Freq: Every day | ORAL | 0 refills | Status: AC

## 2020-12-31 NOTE — Patient Instructions (Addendum)
Terbinafine Counseling  Terbinafine is an anti-fungal medicine that can be applied to the skin (over the counter) or taken by mouth (prescription) to treat fungal infections. The pill version is often used to treat fungal infections of the nails or scalp. While most people do not have any side effects from taking terbinafine pills, some possible side effects of the medicine can include taste changes, headache, loss of smell, vision changes, nausea, vomiting, or diarrhea.   Rare side effects can include irritation of the liver, allergic reaction, or decrease in blood counts (which may show up as not feeling well or developing an infection). If you are concerned about any of these side effects, please stop the medicine and call your doctor, or in the case of an emergency such as feeling very unwell, seek immediate medical care.    If you have any questions or concerns for your doctor, please call our main line at [phone] and press option 4 to reach your doctor's medical assistant. If no one answers, please leave a voicemail as directed and we will return your call as soon as possible. Messages left after 4 pm will be answered the following business day.   You may also send us a message via MyChart. We typically respond to MyChart messages within 1-2 business days.  For prescription refills, please ask your pharmacy to contact our office. Our fax number is [phone].  If you have an urgent issue when the clinic is closed that cannot wait until the next business day, you can page your doctor at the number below.    Please note that while we do our best to be available for urgent issues outside of office hours, we are not available 24/7.   If you have an urgent issue and are unable to reach us, you may choose to seek medical care at your doctor's office, retail clinic, urgent care center, or emergency room.  If you have a medical emergency, please immediately call 911 or go to the emergency  department.  Pager Numbers  - Dr. Kowalski: [phone]  - Dr. Moye: [phone]  - Dr. Stewart: [phone]  In the event of inclement weather, please call our main line at [phone] for an update on the status of any delays or closures.  Dermatology Medication Tips: Please keep the boxes that topical medications come in in order to help keep track of the instructions about where and how to use these. Pharmacies typically print the medication instructions only on the boxes and not directly on the medication tubes.   If your medication is too expensive, please contact our office at [phone] option 4 or send us a message through MyChart.   We are unable to tell what your co-pay for medications will be in advance as this is different depending on your insurance coverage. However, we may be able to find a substitute medication at lower cost or fill out paperwork to get insurance to cover a needed medication.   If a prior authorization is required to get your medication covered by your insurance company, please allow us 1-2 business days to complete this process.  Drug prices often vary depending on where the prescription is filled and some pharmacies may offer cheaper prices.  The website www.goodrx.com contains coupons for medications through different pharmacies. The prices here do not account for what the cost may be with help from insurance (it may be cheaper with your insurance), but the website can give you the price if you did not   use any insurance.  - You can print the associated coupon and take it with your prescription to the pharmacy.  - You may also stop by our office during regular business hours and pick up a GoodRx coupon card.  - If you need your prescription sent electronically to a different pharmacy, notify our office through [place] MyChart or by phone at [phone] option 4.  

## 2020-12-31 NOTE — Progress Notes (Signed)
Follow-Up Visit   Subjective  Peter [name] is a 55 y.o. male who presents for the following: Follow-up (Patient presents for 2 month follow-up tinea manus/pedis/unguium. He is improving using ciclopirox cream and terbinafine (almost finished with 3rd month).).  Nails are improving, but still involved.  He has persistent joint pain from arthritis.  He gets morning stiffness. Takes Plaquenil but it doesn't seem to help very much.  Has appt with pain clinic.  No other skin rashes on body or scalp.   The following portions of the chart were reviewed this encounter and updated as appropriate:        Review of Systems:  No other skin or systemic complaints except as noted in HPI or Assessment and Plan.  Objective  Well appearing patient in no apparent distress; mood and affect are within normal limits.  A focused examination was performed including hands, feet, nails. Relevant physical exam findings are noted in the Assessment and Plan.  hands, nails Nail dystrophy of the fingernails with clearance at proximal nails. Photos compared.  Palms and forearms are clear   hands Well demarcated pink patches on MCPs; joint enlargement of the MCPs and DIPs. Nail dystrophy fingernails   Assessment & Plan  Tinea manus hands, nails  Tinea manus/pedis/unguium with possible nail psoriasis  Severe- Improving  Labs from 11/2020 wnl. No side effects from medication. Will continue for 1 more month terbinafine 250mg  take 1 po QD with food dsp #30 0Rf. Continue ciclopirox cream BID hands and feet.  Terbinafine Counseling  Terbinafine is an anti-fungal medicine that can be applied to the skin (over the counter) or taken by mouth (prescription) to treat fungal infections. The pill version is often used to treat fungal infections of the nails or scalp. While most people do not have any side effects from taking terbinafine pills, some possible side effects of the medicine can include taste changes,  headache, loss of smell, vision changes, nausea, vomiting, or diarrhea.   Rare side effects can include irritation of the liver, allergic reaction, or decrease in blood counts (which may show up as not feeling well or developing an infection). If you are concerned about any of these side effects, please stop the medicine and call your doctor, or in the case of an emergency such as feeling very unwell, seek immediate medical care.     terbinafine (LAMISIL) 250 MG tablet - hands, nails Take 1 tablet (250 mg total) by mouth daily.  Psoriasis hands  Nail psoriasis with active inflammatory arthritis  Psoriasis is a chronic non-curable, but treatable genetic/hereditary disease that may have other systemic features affecting other organ systems such as joints (Psoriatic Arthritis). It is associated with an increased risk of inflammatory bowel disease, heart disease, non-alcoholic fatty liver disease, and depression.    Pending labs and insurance approval, plan to start Taltz injections.  Patient also has arthritis and is taking hydroxychloroquine from rheumatologist. He has upcoming appt with pain clinic for joint pain.  Reviewed risks of biologics including immunosuppression, infections, injection site reaction, and failure to improve condition. Goal is control of skin condition, not cure.  Some older biologics such as Humira and Enbrel may slightly increase risk of malignancy and may worsen congestive heart failure. The use of biologics requires long term medication management, including periodic office visits and monitoring of blood work.     Related Procedures Comprehensive metabolic panel CBC with Differential/Platelet Hepatitis B surface antibody,qualitative Hepatitis B surface antigen Hepatitis C antibody HIV Antibody (  routine testing w rflx) QuantiFERON-TB Gold Plus  Return in about 1 month (around 01/30/2021) for tinea/psoriasis.  ICherlyn Labella, CMA, am acting as scribe for  Willeen Niece, MD .  Documentation: I have reviewed the above documentation for accuracy and completeness, and I agree with the above.  Willeen Niece MD

## 2021-01-14 DIAGNOSIS — Z79899 Other long term (current) drug therapy: Secondary | ICD-10-CM | POA: Diagnosis not present

## 2021-01-14 DIAGNOSIS — L409 Psoriasis, unspecified: Secondary | ICD-10-CM | POA: Diagnosis not present

## 2021-01-17 ENCOUNTER — Ambulatory Visit (INDEPENDENT_AMBULATORY_CARE_PROVIDER_SITE_OTHER): Payer: BC Managed Care – PPO | Admitting: Family Medicine

## 2021-01-17 ENCOUNTER — Encounter: Payer: Self-pay | Admitting: Family Medicine

## 2021-01-17 ENCOUNTER — Other Ambulatory Visit: Payer: Self-pay

## 2021-01-17 VITALS — BP 118/76 | HR 95 | Temp 98.1°F | Resp 16 | Ht 70.0 in | Wt 210.8 lb

## 2021-01-17 DIAGNOSIS — Z125 Encounter for screening for malignant neoplasm of prostate: Secondary | ICD-10-CM | POA: Diagnosis not present

## 2021-01-17 DIAGNOSIS — Z1159 Encounter for screening for other viral diseases: Secondary | ICD-10-CM

## 2021-01-17 DIAGNOSIS — Z23 Encounter for immunization: Secondary | ICD-10-CM

## 2021-01-17 DIAGNOSIS — Z87891 Personal history of nicotine dependence: Secondary | ICD-10-CM | POA: Diagnosis not present

## 2021-01-17 DIAGNOSIS — Z0000 Encounter for general adult medical examination without abnormal findings: Secondary | ICD-10-CM

## 2021-01-17 DIAGNOSIS — F17200 Nicotine dependence, unspecified, uncomplicated: Secondary | ICD-10-CM | POA: Diagnosis not present

## 2021-01-17 DIAGNOSIS — E785 Hyperlipidemia, unspecified: Secondary | ICD-10-CM

## 2021-01-17 DIAGNOSIS — J449 Chronic obstructive pulmonary disease, unspecified: Secondary | ICD-10-CM

## 2021-01-17 DIAGNOSIS — E669 Obesity, unspecified: Secondary | ICD-10-CM

## 2021-01-17 DIAGNOSIS — E66811 Obesity, class 1: Secondary | ICD-10-CM

## 2021-01-17 DIAGNOSIS — Z1211 Encounter for screening for malignant neoplasm of colon: Secondary | ICD-10-CM

## 2021-01-17 MED ORDER — FLUTICASONE-SALMETEROL 250-50 MCG/ACT IN AEPB: 1.0000 | INHALATION_SPRAY | Freq: Two times a day (BID) | RESPIRATORY_TRACT | 11 refills | Status: DC

## 2021-01-17 MED ORDER — ZOSTER VAC RECOMB ADJUVANTED 50 MCG/0.5ML IM SUSR: 0.5000 mL | Freq: Once | INTRAMUSCULAR | 1 refills | Status: AC

## 2021-01-17 NOTE — Progress Notes (Signed)
Patient: Peter [name], Male    DOB: 09/14/1965, 55 y.o.   MRN: [id number] Peter Haring, MD Visit Date: 01/17/2021  Today's Provider: Danelle Berry, PA-C   Chief Complaint  Patient presents with   Annual Exam   Subjective:   Annual physical exam: No CPE since 2020 with Dr. Tamala Fothergill a year ago with Dr. Dorris Fetch, labs done and reviewed  Peter [name] is a 55 y.o. male who presents today for health maintenance and annual & complete physical exam.   Exercise/Activity:  very active with physical job  Diet/nutrition:   healthy, usually eats at home Sleep: no concerns - poor sleep due tohronic pain   Pt wished to do routine f/up on chronic conditions today in addition to CPE. Advised pt of separate visit billing/coding  COPD -patient in need of Advair inhaler refills, cannot see who recently prescribed his inhalers last on chart was from 2020 by Dr. Sherie Don who retired He is a current smoker, wants to quit but has not been able to afford medications or resources He denies any worsening shortness of breath, hemoptysis, unintentional weight loss, chest pain  Hyperlipidemia -patient is not on medications he states a few years ago his cholesterol was extremely high and he improved his diet and it improved somewhat, reviewed his last lipids and his ASCVD risk score today with him (see below in chart/note) Discussed statin meds to reduce risk of MI/stroke   USPSTF grade A and B recommendations - reviewed and addressed today  Depression:  Phq 9 completed today by patient, was reviewed by me with patient in the room, score is  positive, pt has a lot of chronic pain and works hard PHQ 2/9 Scores 01/17/2021 11/20/2020 11/04/2020 04/10/2020  PHQ - 2 Score 6 0 0 6  PHQ- 9 Score 19 0 - 14   Depression screen [facility] 2/9 01/17/2021 11/20/2020 11/04/2020 04/10/2020 04/04/2020  Decreased Interest 3 0 0 3 3  Down, Depressed, Hopeless 3 0 0 3 3  PHQ - 2 Score 6 0 0 6 6  Altered sleeping 3 0 - 3 3   Tired, decreased energy 3 0 - 3 2  Change in appetite 2 0 - 1 1  Feeling bad or failure about yourself  1 0 - 1 3  Trouble concentrating 1 0 - 0 0  Moving slowly or fidgety/restless 1 0 - 0 0  Suicidal thoughts 2 0 - 0 2  PHQ-9 Score 19 0 - 14 17  Difficult doing work/chores Very difficult Not difficult at all - Somewhat difficult Very difficult    Hep C Screening: done by dermatology - pending results in chart  STD testing and prevention (HIV/chl/gon/syphilis): done by dermatology - pending results in chart  Intimate partner violence:  safe  Prostate cancer:  Prostate cancer screening with PSA: Discussed risks and benefits of PSA testing and provided handout. Pt wishes to have PSA drawn today.  Lab Results  Component Value Date   PSA 0.5 02/02/2020    Urinary Symptoms:   IPSS     Row Name 01/17/21 1504         International Prostate Symptom Score   How often have you had the sensation of not emptying your bladder? About half the time     How often have you had to urinate less than every two hours? More than half the time     How often have you found you stopped and started again several times when  you urinated? Not at All     How often have you found it difficult to postpone urination? Not at All     How often have you had a weak urinary stream? Less than half the time     How often have you had to strain to start urination? Not at All     How many times did you typically get up at night to urinate? 2 Times     Total IPSS Score 11           Quality of Life due to urinary symptoms     If you were to spend the rest of your life with your urinary condition just the way it is now how would you feel about that? Mostly Satisfied             Advanced Care Planning:  A voluntary discussion about advance care planning including the explanation and discussion of advance directives.  Discussed health care proxy and Living will, and the patient was able to identify a health  care proxy as brother - Peter [name].  Patient does not have a living will at present time. If patient does have living will, I have requested they bring this to the clinic to be scanned in to their chart.  Health Maintenance  Topic Date Due   COVID-19 Vaccine (1) Never done   Hepatitis C Screening  Never done   Zoster Vaccines- Shingrix (1 of 2) Never done   Pneumococcal Vaccine 77-60 Years old (2 - PCV) 09/11/2016   COLONOSCOPY (Pts 45-67yrs Insurance coverage will need to be confirmed)  08/15/2020   INFLUENZA VACCINE  02/03/2021   TETANUS/TDAP  04/26/2028   HIV Screening  Completed   HPV VACCINES  Aged Out     Skin cancer:  Pt reports no hx of skin cancer, suspicious lesions/biopsies in the past - est with dermatology  Colorectal cancer:  colonoscopy is Due Pt denies change in bowels, melena, hematochezia  Lung cancer:   Low Dose CT Chest recommended if Age 70-80 years, 20 pack-year currently smoking OR have quit w/in 15years. Patient does qualify.   Social History   Tobacco Use   Smoking status: Every Day    Packs/day: 1.00    Years: 42.00    Pack years: 42.00    Types: Cigarettes   Smokeless tobacco: Never  Substance Use Topics   Alcohol use: No    Alcohol/week: 0.0 standard drinks     Alcohol screening: Flowsheet Row Office Visit from 01/17/2021 in [facility]  AUDIT-C Score 0       AAA:  The USPSTF recommends one-time screening with ultrasonography in men ages 49 to 24 years who have ever smoked  ECG: None indicated today  Blood pressure/Hypertension: BP Readings from Last 3 Encounters:  01/17/21 118/76  11/15/20 127/73  11/04/20 119/90   Weight/Obesity: Wt Readings from Last 3 Encounters:  01/17/21 210 lb 12.8 oz (95.6 kg)  11/20/20 210 lb (95.3 kg)  11/15/20 220 lb (99.8 kg)   BMI Readings from Last 3 Encounters:  01/17/21 30.25 kg/m  11/20/20 30.13 kg/m  11/15/20 33.45 kg/m    Lipids:  Lab Results  Component Value Date    CHOL 174 02/02/2020   CHOL 156 09/08/2018   CHOL 159 04/26/2018   Lab Results  Component Value Date   HDL 46 02/02/2020   HDL 40 09/08/2018   HDL 36 (L) 04/26/2018   Lab Results  Component Value Date  LDLCALC 114 (H) 02/02/2020   LDLCALC 96 09/08/2018   LDLCALC 100 (H) 04/26/2018   Lab Results  Component Value Date   TRIG 53 02/02/2020   TRIG 103 09/08/2018   TRIG 126 04/26/2018   Lab Results  Component Value Date   CHOLHDL 3.8 02/02/2020   CHOLHDL 3.9 09/08/2018   CHOLHDL 4.4 04/26/2018   No results found for: LDLDIRECT Based on the results of lipid panel his/her cardiovascular risk factor ( using [facility] )  in the next 10 years is : The 10-year ASCVD risk score Denman George DC Montez Hageman., et al., 2013) is: 7.8%   Values used to calculate the score:     Age: 7 years     Sex: Male     Is Non-Hispanic African American: No     Diabetic: No     Tobacco smoker: Yes     Systolic Blood Pressure: 118 mmHg     Is BP treated: No     HDL Cholesterol: 46 mg/dL     Total Cholesterol: 174 mg/dL Glucose:  Glucose  Date Value Ref Range Status  11/04/2020 74 65 - 99 mg/dL Final  66/44/0347 90 65 - 99 mg/dL Final  42/59/5638 95 65 - 99 mg/dL Final   Glucose, Bld  Date Value Ref Range Status  11/15/2020 89 70 - 99 mg/dL Final    Comment:    Glucose reference range applies only to samples taken after fasting for at least 8 hours.  02/02/2020 77 65 - 99 mg/dL Final    Comment:    .            Fasting reference interval .   02/10/2019 92 70 - 99 mg/dL Final    Social History      He  reports that he has been smoking cigarettes. He has a 42.00 pack-year smoking history. He has never used smokeless tobacco. He reports that he does not drink alcohol and does not use drugs.       Social History   Socioeconomic History   Marital status: Single    Spouse name: Not on file   Number of children: Not on file   Years of education: Not on file   Highest education level: GED or  equivalent  Occupational History   Not on file  Tobacco Use   Smoking status: Every Day    Packs/day: 1.00    Years: 42.00    Pack years: 42.00    Types: Cigarettes   Smokeless tobacco: Never  Vaping Use   Vaping Use: Never used  Substance and Sexual Activity   Alcohol use: No    Alcohol/week: 0.0 standard drinks   Drug use: No   Sexual activity: Not Currently  Other Topics Concern   Not on file  Social History Narrative   Not on file   Social Determinants of Health   Financial Resource Strain: Medium Risk   Difficulty of Paying Living Expenses: Somewhat hard  Food Insecurity: No Food Insecurity   Worried About Programme researcher, broadcasting/film/video in the Last Year: Never true   Ran Out of Food in the Last Year: Never true  Transportation Needs: No Transportation Needs   Lack of Transportation (Medical): No   Lack of Transportation (Non-Medical): No  Physical Activity: Sufficiently Active   Days of Exercise per Week: 7 days   Minutes of Exercise per Session: 60 min  Stress: Stress Concern Present   Feeling of Stress : Very much  Social Connections:  Socially Isolated   Frequency of Communication with Friends and Family: More than three times a week   Frequency of Social Gatherings with Friends and Family: Patient refused   Attends Religious Services: Never   Database administratorActive Member of Clubs or Organizations: No   Attends Engineer, structuralClub or Organization Meetings: Never   Marital Status: Never married     Family History        Family Status  Relation Name Status   Mother  Alive   Father  Deceased at age 55   PGM  Deceased   Mat Uncle  (Not Specified)   Brother 1 Alive   MGM  Deceased   MGF  Deceased   PGF  Deceased        His family history includes Cholecystitis in his mother; Diabetes in his brother and paternal grandmother; Emphysema in his paternal grandmother; Hypertension in his brother; Lung cancer in his father; Prostate cancer in his maternal uncle.       Family History  Problem  Relation Age of Onset   Cholecystitis Mother    Lung cancer Father    Emphysema Paternal Grandmother    Diabetes Paternal Grandmother    Prostate cancer Maternal Uncle    Diabetes Brother    Hypertension Brother     Patient Active Problem List   Diagnosis Date Noted   Chronic pain syndrome 11/04/2020   Pharmacologic therapy 11/04/2020   Disorder of skeletal system 11/04/2020   Problems influencing health status 11/04/2020   Abnormal MRI, thoracic spine (07/26/2020) 11/04/2020   Abnormal MRI, lumbar spine (07/26/2020) 11/04/2020   Abnormal MRI, cervical spine (01/11/2019) 11/04/2020   Retrolisthesis (T12/L1) & (L1/L2) 11/04/2020   Anterolisthesis of lumbosacral spine (L5/S1) 11/04/2020   Cervical fusion syndrome (ACDF C3-7) 11/04/2020   Chronic low back pain (1ry area of Pain) (Bilateral) (R>L) w/o sciatica 11/04/2020   Chronic mid back pain (Bilateral) (R>L) 11/04/2020   Cervicalgia 11/04/2020   Chronic neck pain (3ry area of Pain) (Bilateral) (R>L) w/ history of cervical spinal surgery 11/04/2020   Cervicogenic headaches (4th area of Pain) (Bilateral) (R>L) 11/04/2020   Occipital headaches (Bilateral) (R>L) 11/04/2020   Chronic upper extremity pain (5th area of Pain) (Bilateral) (R>L) 11/04/2020   Chronic wrist pain (7th area of Pain) (Bilateral) (R>L) 11/04/2020   Primary osteoarthritis involving multiple joints 06/24/2020   Rheumatoid arthritis involving multiple sites with positive rheumatoid factor (HCC) 06/24/2020   Postherpetic neuralgia 04/10/2020   Herpes zoster without complication 04/04/2020   Reactive depression 04/04/2020   Chronic fatigue 02/02/2020   S/P cervical spinal fusion 02/15/2019   Preventative health care 09/08/2018   Ventral hernia without obstruction or gangrene 09/08/2018   History of colonoscopy with polypectomy 09/08/2018   Hx of lower gastrointestinal bleeding 09/08/2018   Obesity (BMI 30.0-34.9) 04/26/2018   COPD (chronic obstructive pulmonary  disease) (HCC) 04/26/2018   Arthritis of right hand 04/26/2018   Arthritis of left hand 04/26/2018   Degenerative disc disease, lumbar 04/26/2018   Chronic knee pain (6th area of Pain) (Bilateral) (R>L) 04/26/2018   Dry skin 04/26/2018   Tobacco dependence 08/20/2015   Cervical radiculopathy 06/22/2015   Displacement of intervertebral disc at C6-C7 level 06/22/2015   Chronic shoulder pain (2ry area of Pain) (Bilateral) (R>L) 06/22/2015    Past Surgical History:  Procedure Laterality Date   ANTERIOR CERVICAL DECOMPRESSION/DISCECTOMY FUSION 4 LEVELS N/A 02/15/2019   Procedure: ANTERIOR CERVICAL DECOMPRESSION/DISCECTOMY FUSION 4 LEVELS C3-7;  Surgeon: Venetia NightYarbrough, [place], MD;  Location: [facility];  Service:  Neurosurgery;  Laterality: N/A;   APPENDECTOMY  1988   BLADDER REPAIR N/A 09/11/2015   Procedure: BLADDER REPAIR;  Surgeon: Hildred Laser, MD;  Location: [facility];  Service: Urology;  Laterality: N/A;   CHOLECYSTECTOMY  1989   COLON SURGERY     for diverticulitis   COLONOSCOPY WITH PROPOFOL N/A 08/16/2015   Procedure: COLONOSCOPY WITH PROPOFOL;  Surgeon: Midge Minium, MD;  Location: [facility];  Service: Endoscopy;  Laterality: N/A;   CYSTOSCOPY WITH STENT PLACEMENT Bilateral 09/11/2015   Procedure: CYSTOSCOPY WITH STENT PLACEMENT;  Surgeon: Hildred Laser, MD;  Location: [facility];  Service: Urology;  Laterality: Bilateral;   ILEOSTOMY CLOSURE N/A 11/19/2015   Procedure: ILEOSTOMY TAKEDOWN;  Surgeon: Gladis Riffle, MD;  Location: [facility];  Service: General;  Laterality: N/A;   LAPAROSCOPIC PARTIAL COLECTOMY N/A 09/11/2015   Procedure: LAPAROSCOPIC PARTIAL COLECTOMY Possible Open, possible ostomy, repair of colovesical fistula Cystoscopy and stent placement, repair of colovesical fistula with Dr. Sherryl Barters as well ;  Surgeon: Gladis Riffle, MD;  Location: [facility];  Service: General;  Laterality: N/A;   POLYPECTOMY  08/16/2015   Procedure: POLYPECTOMY;  Surgeon: Midge Minium, MD;  Location: [facility];  Service: Endoscopy;;   TONSILLECTOMY  1970     Current Outpatient Medications:    ciclopirox (LOPROX) 0.77 % cream, Apply topically as directed. Qd to bid to hands, feet, and around nails, Disp: 90 g, Rfl: 3   fluticasone-salmeterol (ADVAIR DISKUS) 250-50 MCG/ACT AEPB, Inhale 1 puff into the lungs in the morning and at bedtime., Disp: 60 each, Rfl: 11   hydroxychloroquine (PLAQUENIL) 200 MG tablet, Take 200 mg by mouth 2 (two) times daily., Disp: , Rfl:    omeprazole (PRILOSEC OTC) 20 MG tablet, Take 20 mg by mouth every morning., Disp: , Rfl:    ondansetron (ZOFRAN) 4 MG tablet, Take 1 tablet (4 mg total) by mouth daily as needed., Disp: 14 tablet, Rfl: 0   terbinafine (LAMISIL) 250 MG tablet, Take 1 tablet (250 mg total) by mouth daily., Disp: 30 tablet, Rfl: 0   Zoster Vaccine Adjuvanted (SHINGRIX) injection, Inject 0.5 mLs into the muscle once for 1 dose. And repeat once more in 2-6 months, Disp: 0.5 mL, Rfl: 1  No Known Allergies  Patient Care Team: Peter Haring, MD as PCP - General (Internal Medicine)   Chart Review: I personally reviewed active problem list, medication list, allergies, family history, social history, health maintenance, notes from last encounter, lab results, imaging with the patient/caregiver today.  Review of Systems  Constitutional: Negative.  Negative for activity change, appetite change, fatigue and unexpected weight change.  HENT: Negative.    Eyes: Negative.   Respiratory: Negative.  Negative for shortness of breath.   Cardiovascular: Negative.  Negative for chest pain, palpitations and leg swelling.  Gastrointestinal: Negative.  Negative for abdominal pain and blood in stool.  Endocrine: Negative.   Genitourinary: Negative.  Negative for decreased urine volume, difficulty urinating, testicular pain and urgency.  Skin: Negative.  Negative for color change and pallor.  Allergic/Immunologic: Negative.    Neurological: Negative.  Negative for syncope, weakness, light-headedness and numbness.  Psychiatric/Behavioral: Negative.  Negative for confusion, dysphoric mood, self-injury and suicidal ideas. The patient is not nervous/anxious.   All other systems reviewed and are negative.        Objective:   Vitals:  Vitals:   01/17/21 1430  BP: 118/76  Pulse: 95  Resp: 16  Temp: 98.1 F (36.7  C)  SpO2: 98%  Weight: 210 lb 12.8 oz (95.6 kg)  Height:  (1.778 m)    Body mass index is 30.25 kg/m.  Physical Exam Vitals and nursing note reviewed.  Constitutional:      General: He is not in acute distress.    Appearance: Normal appearance. He is well-developed. He is obese. He is not ill-appearing, toxic-appearing or diaphoretic.     Interventions: Face mask in place.  HENT:     Head: Normocephalic and atraumatic.     Jaw: No trismus.     Right Ear: External ear normal.     Left Ear: External ear normal.  Eyes:     General: Lids are normal. No scleral icterus.       Right eye: No discharge.        Left eye: No discharge.     Conjunctiva/sclera: Conjunctivae normal.  Neck:     Trachea: Trachea and phonation normal. No tracheal deviation.  Cardiovascular:     Rate and Rhythm: Normal rate and regular rhythm.     Pulses: Normal pulses.          Radial pulses are 2+ on the right side and 2+ on the left side.       Posterior tibial pulses are 2+ on the right side and 2+ on the left side.     Heart sounds: Normal heart sounds. No murmur heard.   No friction rub. No gallop.  Pulmonary:     Effort: Pulmonary effort is normal. No respiratory distress.     Breath sounds: No stridor. Rhonchi present. No wheezing or rales.  Abdominal:     General: Bowel sounds are normal. There is no distension.     Palpations: Abdomen is soft.     Tenderness: There is no abdominal tenderness. There is no right CVA tenderness, left CVA tenderness, guarding or rebound.     Hernia: A hernia is present.   Musculoskeletal:     Right lower leg: No edema.     Left lower leg: No edema.  Lymphadenopathy:     Cervical: No cervical adenopathy.  Skin:    General: Skin is warm and dry.     Coloration: Skin is not jaundiced or pale.     Findings: No rash.     Nails: There is no clubbing.  Neurological:     Mental Status: He is alert. Mental status is at baseline.     Cranial Nerves: No dysarthria or facial asymmetry.     Motor: No tremor or abnormal muscle tone.     Gait: Gait normal.  Psychiatric:        Mood and Affect: Mood normal.        Speech: Speech normal.        Behavior: Behavior normal. Behavior is cooperative.     Fall Risk: Fall Risk  01/17/2021 11/20/2020 11/04/2020 04/10/2020 04/04/2020  Falls in the past year? 1 0 1 0 0  Number falls in past yr: 1 0 0 0 0  Injury with Fall? 1 0 0 0 0  Follow up - - - - Falls evaluation completed    Functional Status Survey: Is the patient deaf or have difficulty hearing?: Yes Does the patient have difficulty seeing, even when wearing glasses/contacts?: No Does the patient have difficulty concentrating, remembering, or making decisions?: Yes Does the patient have difficulty walking or climbing stairs?: Yes Does the patient have difficulty dressing or bathing?: No Does the patient have difficulty  doing errands alone such as visiting a doctor's office or shopping?: No   Assessment & Plan:    CPE completed today   Prostate cancer screening and PSA options (with potential risks and benefits of testing vs not testing) were discussed along with recent recs/guidelines, shared decision making and handout/information given to pt today  USPSTF grade A and B recommendations reviewed with patient; age-appropriate recommendations, preventive care, screening tests, etc discussed and encouraged; healthy living encouraged; see AVS for patient education given to patient  Discussed importance of 150 minutes of physical activity weekly, AHA exercise  recommendations given to pt in AVS/handout  Discussed importance of healthy diet:  eating lean meats and proteins, avoiding trans fats and saturated fats, avoid simple sugars and excessive carbs in diet, eat 6 servings of fruit/vegetables daily and drink plenty of water and avoid sweet beverages.  DASH diet reviewed if pt has HTN  Recommended pt to do annual eye exam and routine dental exams/cleanings  Reviewed Health Maintenance: Health Maintenance  Topic Date Due   COVID-19 Vaccine (1) Never done   Hepatitis C Screening  Never done   Zoster Vaccines- Shingrix (1 of 2) Never done   Pneumococcal Vaccine 15-16 Years old (2 - PCV) 09/11/2016   COLONOSCOPY (Pts 45-74yrs Insurance coverage will need to be confirmed)  08/15/2020   INFLUENZA VACCINE  02/03/2021   TETANUS/TDAP  04/26/2028   HIV Screening  Completed   HPV VACCINES  Aged Out    Immunizations: Immunization History  Administered Date(s) Administered   Pneumococcal Polysaccharide-23 09/12/2015   Tdap 04/26/2018     ICD-10-CM   1. Adult general medical exam  Z00.00 Lipid panel    PSA    2. Chronic obstructive pulmonary disease, unspecified COPD type (HCC)  J44.9 fluticasone-salmeterol (ADVAIR DISKUS) 250-50 MCG/ACT AEPB    Ambulatory Referral for Lung Cancer Scre   Patient with rhonchi on exam and intermittently coughing refills on Advair, he endorses environmental exposures to coal in addition to smoking may need pulm    3. Tobacco dependence  F17.200 Ambulatory Referral for Lung Cancer Scre   Discussed medications and local resources    4. History of smoking 30 or more pack years  Z87.891 Ambulatory Referral for Lung Cancer Scre   Referred back to lung cancer screening program due for low-dose CT in September    5. Obesity (BMI 30.0-34.9)  E66.9     6. Encounter for hepatitis C screening test for low risk patient  Z11.59    Hep C and multiple other infectious diseases screened by dermatology labs which are currently  pending    7. Hyperlipidemia, unspecified hyperlipidemia type  E78.5 Lipid panel   last LDL 114, current smoker, will recheck lipid panel and calculate ASCVD risk and then discuss statin recommendations    8. Screening for malignant neoplasm of colon  Z12.11 Ambulatory referral to Gastroenterology    9. Screening for malignant neoplasm of prostate  Z12.5 PSA    10. Need for shingles vaccine  Z23 Zoster Vaccine Adjuvanted [facility]) injection   sent to pharmacy - pt advised to discuss with derm before any starting any immunosuppressive medications         Peter Berry, PA-C 01/17/21 5:38 PM  [facility]

## 2021-01-17 NOTE — Patient Instructions (Addendum)
Health Maintenance  Topic Date Due   COVID-19 Vaccine (1) Never done   Hepatitis C Screening: USPSTF Recommendation to screen - Ages 3-55 yo.  Never done   Zoster (Shingles) Vaccine (1 of 2) Never done   Pneumococcal Vaccination (2 - PCV) 09/11/2016   Colon Cancer Screening  08/15/2020   Flu Shot  02/03/2021   Tetanus Vaccine  04/26/2028   HIV Screening  Completed   HPV Vaccine  Aged Out    Preventive Care 12-51 Years Old, Male Preventive care refers to lifestyle choices and visits with your health care provider that can promote health and wellness. This includes: A yearly physical exam. This is also called an annual wellness visit. Regular dental and eye exams. Immunizations. Screening for certain conditions. Healthy lifestyle choices, such as: Eating a healthy diet. Getting regular exercise. Not using drugs or products that contain nicotine and tobacco. Limiting alcohol use. What can I expect for my preventive care visit? Physical exam Your health care provider will check your: Height and weight. These may be used to calculate your BMI (body mass index). BMI is a measurement that tells if you are at a healthy weight. Heart rate and blood pressure. Body temperature. Skin for abnormal spots. Counseling Your health care provider may ask you questions about your: Past medical problems. Family's medical history. Alcohol, tobacco, and drug use. Emotional well-being. Home life and relationship well-being. Sexual activity. Diet, exercise, and sleep habits. Work and work Astronomer. Access to firearms. What immunizations do I need?  Vaccines are usually given at various ages, according to a schedule. Your health care provider will recommend vaccines for you based on your age, medicalhistory, and lifestyle or other factors, such as travel or where you work. What tests do I need? Blood tests Lipid and cholesterol levels. These may be checked every 5 years, or more often if you  are over 66 years old. Hepatitis C test. Hepatitis B test. Screening Lung cancer screening. You may have this screening every year starting at age 75 if you have a 30-pack-year history of smoking and currently smoke or have quit within the past 15 years. Prostate cancer screening. Recommendations will vary depending on your family history and other risks. Genital exam to check for testicular cancer or hernias. Colorectal cancer screening. All adults should have this screening starting at age 58 and continuing until age 51. Your health care provider may recommend screening at age 29 if you are at increased risk. You will have tests every 1-10 years, depending on your results and the type of screening test. Diabetes screening. This is done by checking your blood sugar (glucose) after you have not eaten for a while (fasting). You may have this done every 1-3 years. STD (sexually transmitted disease) testing, if you are at risk. Follow these instructions at home: Eating and drinking  Eat a diet that includes fresh fruits and vegetables, whole grains, lean protein, and low-fat dairy products. Take vitamin and mineral supplements as recommended by your health care provider. Do not drink alcohol if your health care provider tells you not to drink. If you drink alcohol: Limit how much you have to 0-2 drinks a day. Be aware of how much alcohol is in your drink. In the [place] one drink equals one 12 oz bottle of beer (355 mL), one 5 oz glass of wine (148 mL), or one 1 oz glass of hard liquor (44 mL).  Lifestyle Take daily care of your teeth and gums. Brush your teeth  every morning and night with fluoride toothpaste. Floss one time each day. Stay active. Exercise for at least 30 minutes 5 or more days each week. Do not use any products that contain nicotine or tobacco, such as cigarettes, e-cigarettes, and chewing tobacco. If you need help quitting, ask your health care provider. Do not use  drugs. If you are sexually active, practice safe sex. Use a condom or other form of protection to prevent STIs (sexually transmitted infections). If told by your health care provider, take low-dose aspirin daily starting at age 74. Find healthy ways to cope with stress, such as: Meditation, yoga, or listening to music. Journaling. Talking to a trusted person. Spending time with friends and family. Safety Always wear your seat belt while driving or riding in a vehicle. Do not drive: If you have been drinking alcohol. Do not ride with someone who has been drinking. When you are tired or distracted. While texting. Wear a helmet and other protective equipment during sports activities. If you have firearms in your house, make sure you follow all gun safety procedures. What's next? Go to your health care provider once a year for an annual wellness visit. Ask your health care provider how often you should have your eyes and teeth checked. Stay up to date on all vaccines. This information is not intended to replace advice given to you by your health care provider. Make sure you discuss any questions you have with your healthcare provider. Document Revised: 03/21/2019 Document Reviewed: 06/16/2018 Elsevier Patient Education  2022 [employer]  Steps to Quit Smoking Smoking tobacco is the leading cause of preventable death. It can affect almost every organ in the body. Smoking puts you and people around you at risk for many serious, long-lasting (chronic) diseases. Quitting smoking can be hard, but it is one of the best things thatyou can do for your health. It is never too late to quit. How do I get ready to quit? When you decide to quit smoking, make a plan to help you succeed. Before you quit: Pick a date to quit. Set a date within the next 2 weeks to give you time to prepare. Write down the reasons why you are quitting. Keep this list in places where you will see it often. Tell your family,  friends, and co-workers that you are quitting. Their support is important. Talk with your doctor about the choices that may help you quit. Find out if your health insurance will pay for these treatments. Know the people, places, things, and activities that make you want to smoke (triggers). Avoid them. What first steps can I take to quit smoking? Throw away all cigarettes at home, at work, and in your car. Throw away the things that you use when you smoke, such as ashtrays and lighters. Clean your car. Make sure to empty the ashtray. Clean your home, including curtains and carpets. What can I do to help me quit smoking? Talk with your doctor about taking medicines and seeing a counselor at the same time. You are more likely to succeed when you do both. If you are pregnant or breastfeeding, talk with your doctor about counseling or other ways to quit smoking. Do not take medicine to help you quit smoking unless your doctor tells you to do so. To quit smoking: Quit right away Quit smoking totally, instead of slowly cutting back on how much you smoke over a period of time. Go to counseling. You are more likely to quit if you  go to counseling sessions regularly. Take medicine You may take medicines to help you quit. Some medicines need a prescription, and some you can buy over-the-counter. Some medicines may contain a drug called nicotine to replace the nicotine in cigarettes. Medicines may: Help you to stop having the desire to smoke (cravings). Help to stop the problems that come when you stop smoking (withdrawal symptoms). Your doctor may ask you to use: Nicotine patches, gum, or lozenges. Nicotine inhalers or sprays. Non-nicotine medicine that is taken by mouth. Find resources Find resources and other ways to help you quit smoking and remain smoke-free after you quit. These resources are most helpful when you use them often. They include: Online chats with a Veterinary surgeoncounselor. Phone  quitlines. Printed Materials engineerself-help materials. Support groups or group counseling. Text messaging programs. Mobile phone apps. Use apps on your mobile phone or tablet that can help you stick to your quit plan. There are many free apps for mobile phones and tablets as well as websites. Examples include Quit Guide from the [employer] and smokefree.gov  What things can I do to make it easier to quit?  Talk to your family and friends. Ask them to support and encourage you. Call a phone quitline (1-800-QUIT-NOW), reach out to support groups, or work with a Veterinary surgeoncounselor. Ask people who smoke to not smoke around you. Avoid places that make you want to smoke, such as: Bars. Parties. Smoke-break areas at work. Spend time with people who do not smoke. Lower the stress in your life. Stress can make you want to smoke. Try these things to help your stress: Getting regular exercise. Doing deep-breathing exercises. Doing yoga. Meditating. Doing a body scan. To do this, close your eyes, focus on one area of your body at a time from head to toe. Notice which parts of your body are tense. Try to relax the muscles in those areas. How will I feel when I quit smoking? Day 1 to 3 weeks Within the first 24 hours, you may start to have some problems that come from quitting tobacco. These problems are very bad 2-3 days after you quit, but they do not often last for more than 2-3 weeks. You may get these symptoms: Mood swings. Feeling restless, nervous, angry, or annoyed. Trouble concentrating. Dizziness. Strong desire for high-sugar foods and nicotine. Weight gain. Trouble pooping (constipation). Feeling like you may vomit (nausea). Coughing or a sore throat. Changes in how the medicines that you take for other issues work in your body. Depression. Trouble sleeping (insomnia). Week 3 and afterward After the first 2-3 weeks of quitting, you may start to notice more positive results, such as: Better sense of smell and  taste. Less coughing and sore throat. Slower heart rate. Lower blood pressure. Clearer skin. Better breathing. Fewer sick days. Quitting smoking can be hard. Do not give up if you fail the first time. Some people need to try a few times before they succeed. Do your best to stick to your quit plan, and talk with yourdoctor if you have any questions or concerns. Summary Smoking tobacco is the leading cause of preventable death. Quitting smoking can be hard, but it is one of the best things that you can do for your health. When you decide to quit smoking, make a plan to help you succeed. Quit smoking right away, not slowly over a period of time. When you start quitting, seek help from your doctor, family, or friends. This information is not intended to replace advice given to  you by your health care provider. Make sure you discuss any questions you have with your healthcare provider. Document Revised: 03/17/2019 Document Reviewed: 09/10/2018 Elsevier Patient Education  2022 [employer].

## 2021-01-18 LAB — QUANTIFERON-TB GOLD PLUS
QuantiFERON Mitogen Value: >10 IU/mL
QuantiFERON Nil Value: 0.01 IU/mL
QuantiFERON TB1 Ag Value: 0.02 IU/mL
QuantiFERON TB2 Ag Value: 0.02 IU/mL
QuantiFERON-TB Gold Plus: NEGATIVE

## 2021-01-18 LAB — CBC WITH DIFFERENTIAL/PLATELET
Basophils Absolute: 0.1 10*3/uL (ref 0.0–0.2)
Basos: 1 %
EOS (ABSOLUTE): 0.2 10*3/uL (ref 0.0–0.4)
Eos: 2 %
Hematocrit: 47.9 % (ref 37.5–51.0)
Hemoglobin: 15.7 g/dL (ref 13.0–17.7)
Immature Grans (Abs): 0 10*3/uL (ref 0.0–0.1)
Immature Granulocytes: 0 %
Lymphocytes Absolute: 1.6 10*3/uL (ref 0.7–3.1)
Lymphs: 25 %
MCH: 30.9 pg (ref 26.6–33.0)
MCHC: 32.8 g/dL (ref 31.5–35.7)
MCV: 94 fL (ref 79–97)
Monocytes Absolute: 0.5 10*3/uL (ref 0.1–0.9)
Monocytes: 7 %
Neutrophils Absolute: 4.2 10*3/uL (ref 1.4–7.0)
Neutrophils: 65 %
Platelets: 193 10*3/uL (ref 150–450)
RBC: 5.08 x10E6/uL (ref 4.14–5.80)
RDW: 13.8 % (ref 11.6–15.4)
WBC: 6.5 10*3/uL (ref 3.4–10.8)

## 2021-01-18 LAB — COMPREHENSIVE METABOLIC PANEL
ALT: 12 IU/L (ref 0–44)
AST: 23 IU/L (ref 0–40)
Albumin/Globulin Ratio: 1.9 (ref 1.2–2.2)
Albumin: 4.5 g/dL (ref 3.8–4.9)
Alkaline Phosphatase: 53 IU/L (ref 44–121)
BUN/Creatinine Ratio: 17 (ref 9–20)
BUN: 17 mg/dL (ref 6–24)
Bilirubin Total: <0.2 mg/dL (ref 0.0–1.2)
CO2: 23 mmol/L (ref 20–29)
Calcium: 9.5 mg/dL (ref 8.7–10.2)
Chloride: 102 mmol/L (ref 96–106)
Creatinine, Ser: 0.99 mg/dL (ref 0.76–1.27)
Globulin, Total: 2.4 g/dL (ref 1.5–4.5)
Glucose: 111 mg/dL — ABNORMAL HIGH (ref 65–99)
Potassium: 4.7 mmol/L (ref 3.5–5.2)
Sodium: 141 mmol/L (ref 134–144)
Total Protein: 6.9 g/dL (ref 6.0–8.5)
eGFR: 91 mL/min/{1.73_m2} (ref >59)

## 2021-01-18 LAB — PSA: PSA: 0.37 ng/mL (ref <=4.00)

## 2021-01-18 LAB — LIPID PANEL
Cholesterol: 167 mg/dL (ref <200)
HDL: 37 mg/dL — ABNORMAL LOW (ref >=40)
LDL Cholesterol (Calc): 110 mg/dL (calc) — ABNORMAL HIGH
Non-HDL Cholesterol (Calc): 130 mg/dL (calc) — ABNORMAL HIGH (ref <130)
Total CHOL/HDL Ratio: 4.5 (calc) (ref <5.0)
Triglycerides: 95 mg/dL (ref <150)

## 2021-01-18 LAB — HEPATITIS B SURFACE ANTIBODY,QUALITATIVE: Hep B Surface Ab, Qual: NONREACTIVE

## 2021-01-18 LAB — HEPATITIS C ANTIBODY: Hep C Virus Ab: <0.1 s/co ratio (ref 0.0–0.9)

## 2021-01-18 LAB — HEPATITIS B SURFACE ANTIGEN: Hepatitis B Surface Ag: NEGATIVE

## 2021-01-18 LAB — HIV ANTIBODY (ROUTINE TESTING W REFLEX): HIV Screen 4th Generation wRfx: NONREACTIVE

## 2021-01-20 ENCOUNTER — Telehealth: Payer: Self-pay

## 2021-01-20 MED ORDER — TALTZ 80 MG/ML ~~LOC~~ SOAJ: 80.0000 mg | Freq: Once | SUBCUTANEOUS | 0 refills | Status: AC

## 2021-01-20 MED ORDER — TALTZ 80 MG/ML ~~LOC~~ SOAJ: 80.0000 mg | SUBCUTANEOUS | 3 refills | Status: DC

## 2021-01-20 MED ORDER — TALTZ 80 MG/ML ~~LOC~~ SOAJ: 160.0000 mg | SUBCUTANEOUS | 0 refills | Status: DC

## 2021-01-20 MED ORDER — TALTZ 80 MG/ML ~~LOC~~ SOAJ: 80.0000 mg | SUBCUTANEOUS | 1 refills | Status: DC

## 2021-01-20 NOTE — Telephone Encounter (Signed)
Advised pt of labs.  Advised we would send Taltz to [facility]

## 2021-01-20 NOTE — Telephone Encounter (Signed)
-----   Message from Willeen Niece, MD sent at 01/20/2021 10:30 AM EDT ----- Labs are normal, screening TB, Hep panel, HIV all negative, send in Rx for Talz - please call patient

## 2021-01-23 DIAGNOSIS — M159 Polyosteoarthritis, unspecified: Secondary | ICD-10-CM | POA: Diagnosis not present

## 2021-01-23 DIAGNOSIS — L4050 Arthropathic psoriasis, unspecified: Secondary | ICD-10-CM | POA: Insufficient documentation

## 2021-01-23 DIAGNOSIS — M0579 Rheumatoid arthritis with rheumatoid factor of multiple sites without organ or systems involvement: Secondary | ICD-10-CM | POA: Diagnosis not present

## 2021-01-23 DIAGNOSIS — L409 Psoriasis, unspecified: Secondary | ICD-10-CM | POA: Diagnosis not present

## 2021-01-23 DIAGNOSIS — Z79899 Other long term (current) drug therapy: Secondary | ICD-10-CM | POA: Diagnosis not present

## 2021-01-24 ENCOUNTER — Other Ambulatory Visit: Payer: Self-pay

## 2021-01-24 MED ORDER — PEG 3350-KCL-NA BICARB-NACL 420 G PO SOLR: ORAL | 0 refills | Status: DC

## 2021-01-26 ENCOUNTER — Other Ambulatory Visit: Payer: Self-pay | Admitting: Dermatology

## 2021-01-26 DIAGNOSIS — B352 Tinea manuum: Secondary | ICD-10-CM

## 2021-01-27 ENCOUNTER — Telehealth: Payer: Self-pay

## 2021-01-27 NOTE — Telephone Encounter (Signed)
I need pts BSA% for Taltz prior auth

## 2021-01-29 ENCOUNTER — Other Ambulatory Visit: Payer: Self-pay | Admitting: *Deleted

## 2021-01-29 DIAGNOSIS — F17210 Nicotine dependence, cigarettes, uncomplicated: Secondary | ICD-10-CM

## 2021-01-29 DIAGNOSIS — Z87891 Personal history of nicotine dependence: Secondary | ICD-10-CM

## 2021-02-01 NOTE — Progress Notes (Signed)
PROVIDER NOTE: Information contained herein reflects review and annotations entered in association with encounter. Interpretation of such information and data should be left to medically-trained personnel. Information provided to patient can be located elsewhere in the medical record under "Patient Instructions". Document created using STT-dictation technology, any transcriptional errors that may result from process are unintentional.    Patient: Peter [name]  Service Category: E/M  Provider: Gaspar Cola, MD  DOB: 1966-04-30  DOS: 02/03/2021  Specialty: Interventional Pain Management  MRN: [id number]  Setting: Ambulatory outpatient  PCP: Towanda Malkin, MD  Type: Established Patient    Referring Provider: Towanda Malkin*  Location: Office  Delivery: Face-to-face     Primary Reason(s) for Visit: Encounter for evaluation before starting new chronic pain management plan of care (Level of risk: moderate) CC: Back Pain  HPI  Peter [name] is a 55 y.o. year old, male patient, who comes today for a follow-up evaluation to review the test results and decide on a treatment plan. He has Cervical radiculopathy; Displacement of intervertebral disc at C6-C7 level; Chronic shoulder pain (2ry area of Pain) (Bilateral) (R>L); Tobacco dependence; Obesity (BMI 30.0-34.9); COPD (chronic obstructive pulmonary disease) ([place]); Arthritis of right hand; Arthritis of left hand; Degenerative disc disease, lumbar; Chronic knee pain (6th area of Pain) (Bilateral) (R>L); Dry skin; Preventative health care; Ventral hernia without obstruction or gangrene; History of colonoscopy with polypectomy; Hx of lower gastrointestinal bleeding; S/P cervical spinal fusion; Chronic fatigue; Herpes zoster without complication; Reactive depression; Postherpetic neuralgia; Primary osteoarthritis involving multiple joints; Rheumatoid arthritis involving multiple sites with positive rheumatoid factor ([place]); Chronic pain syndrome;  Pharmacologic therapy; Disorder of skeletal system; Problems influencing health status; Abnormal MRI, thoracic spine (07/26/2020); Abnormal MRI, lumbar spine (07/26/2020); Abnormal MRI, cervical spine (01/11/2019); Retrolisthesis (T12/L1) & (L1/L2); Anterolisthesis of lumbosacral spine (L5/S1); Cervical fusion syndrome (ACDF C3-7); Chronic low back pain (1ry area of Pain) (Bilateral) (R>L) w/o sciatica; Chronic mid back pain (Bilateral) (R>L); Cervicalgia; Chronic neck pain (3ry area of Pain) (Bilateral) (R>L) w/ history of cervical spinal surgery; Cervicogenic headaches (4th area of Pain) (Bilateral) (R>L); Occipital headaches (Bilateral) (R>L); Chronic upper extremity pain (5th area of Pain) (Bilateral) (R>L); Chronic wrist pain (7th area of Pain) (Bilateral) (R>L); Hyperlipidemia; Psoriatic arthritis ([place]); Elevated sed rate; Vitamin D insufficiency; DDD (degenerative disc disease), cervical; Osteoarthritis of AC (acromioclavicular) joint (Right); Osteoarthritis of AC (acromioclavicular) joint (Left); Cervical foraminal stenosis (Multilevel) (Bilateral); Lumbar facet hypertrophy; Lumbosacral facet joint syndrome; and Spondylosis without myelopathy or radiculopathy, lumbosacral region on their problem list. His primarily concern today is the Back Pain  Pain Assessment: Location: Lower Back Radiating: pain radiaties down both leg to his feet Onset: More than a month ago Duration: Chronic pain Quality: Aching, Tingling, Constant, Numbness Severity: 10-Worst pain ever/10 (subjective, self-reported pain score)  Effect on ADL: limits my daily activities Timing: Constant Modifying factors: nothing BP: (!) 143/82  HR: 86  Peter [name] comes in today for a follow-up visit after his initial evaluation on 11/04/2020. Today we went over the results of his tests. These were explained in "Layman's terms". During today's appointment we went over my diagnostic impression, as well as the proposed treatment  plan.  Review of initial evaluation: "According to the patient the primary area of pain is that of the lower back (Bilateral) (R>L).  The patient denies any prior back surgeries but does indicate having had physical therapy at [facility] approximately 3 to 4 years ago.  Not only that the physical therapy did not  help but it actually made it worse according to the patient.  He indicates having had some x-rays and MRIs that are less than 72 years old.  He also indicates having had some nerve blocks in the back, which did not help, done by Dr. Girtha Hake from the [place] department.  He indicates that after he had those injections done by Dr. Alba Destine, she referred him to Dr. Cari Caraway who then indicated that his back problems were not bad enough to warrant surgery.  Furthermore, he told him that it was very likely that he would continue having some back pain.   The patient's secondary area pain is that of the shoulders (Bilateral) (R>L).  He denies any prior surgeries but does indicate having had some shoulder injections done by Dr. Venetia Maxon at the [facility] family practice.  He denies any x-rays but he refers having had physical therapy approximately 4 to 5 years ago at [facility] which also made things worse.   The patient's third area pain is that of the neck and the posterior aspect (Bilateral) (R>L).  He indicates having had cervical spine surgery by Dr. Cari Caraway in 2021.  He denies having had any nerve blocks or formal physical therapy after the procedure.  He indicates that he was given some exercises to do, which she did.  He denies any recent x-rays of the cervical spine.   The patient's fourth area of pain is that of headaches located in the occipital region.  He indicates his headaches to be bilateral (R>L).  He denies any surgeries in that area.  He denies any nerve blocks or physical therapy.  No recent x-rays of that area.   The patient's fifth area of pain is that of the upper  extremities (Bilateral) (R>L).  In the case of the right upper extremity the pain goes all the way down to the thumb and index finger following a C6 dermatomal distribution.  In the case of the left upper extremity the pain goes all the way down into his thumb, index finger, middle finger, and ring finger and what appears to be a C6 through C8 dermatomal distribution.  He denies any nerve blocks or upper extremity surgeries.   The patient's sixth area of pain is that of the knees (Bilateral) (R>L).  He denies any prior surgeries, joint injections, physical therapy, or x-rays of the knees.   The patient's seventh area pain is that of the wrists (Bilateral) (R>L).  He denies any surgeries, recent x-rays, physical therapy, or any new nerve blocks in that area.   The patient does have a history of rheumatoid and osteoarthritis for which he is seen Dr. Posey Pronto (rheumatologist).  He refers that he was recently given some hydroxychloroquine 200 mg to be taken twice daily.  He refers that this seems to have helped to some degree.   The patient's neurosurgeon is Dr. Cari Caraway and as previously stated he had some injections done by Dr. Girtha Hake at the [place] department.   Treatments done by Dr. Alba Destine included: Therapeutic right L4-5 transforaminal ESI x2 (09/03/2020; 08/14/2020) (no improvement from these, according to notes).   Pharmacology: The patient indicates having taken some hydrocodone, and gabapentin in the past."   Today I have reviewed the patient's x-rays as well as lab work and I have provided him with some recommendations regarding the use of over-the-counter vitamin D3 to address this issue with the vitamin D insufficiency results that we one call her.  I have  provided the patient with copies of all of his x-rays as well as MRIs.  It is my impression that most of the low back pain is probably coming from the facet joints.  MRIs show evidence of different degrees of facet joint  hypertrophy, bilaterally, both in the lumbar and thoracic regions.  During today's visit I also addressed his unrealistic expectations as he kept indicating that he wanted for somebody to "fix" his problems.  In addition, I also addressed the issues of the medications and the fact that I will not be prescribing any opioid secondary to his abnormal UDS.  The patient was clearly not happy with this and kept on telling me that he had already tried a injections and they had not worked.  I apologized to the patient for not being able to offer him what he was interested in and just before I was going to let him go, he then decided that he wanted to try the injections.   Controlled Substance Pharmacotherapy Assessment REMS (Risk Evaluation and Mitigation Strategy)  Analgesic: Hydrocodone/APAP 5/325 1 tab p.o. twice daily (10/01/2020). NO OPIOIDS - Abnormal UDS (11/04/2020): Positive for marijuana MME/day: 10 mg/day  Pill Count: None expected due to no prior prescriptions written by our practice. Chauncey Fischer, RN  02/03/2021  1:51 PM  Sign when Signing Visit Safety precautions to be maintained throughout the outpatient stay will include: orient to surroundings, keep bed in low position, maintain call bell within reach at all times, provide assistance with transfer out of bed and ambulation.     Monitoring: [place] PMP: PDMP reviewed during this encounter. Online review of the past 20-monthperiod previously conducted. Not applicable at this point since we have not taken over the patient's medication management yet. List of other Serum/Urine Drug Screening Test(s):  No results found for: AMPHSCRSER, BARBSCRSER, BENZOSCRSER, COCAINSCRSER, COCAINSCRNUR, PCPSCRSER, THCSCRSER, THCU, CANNABQUANT, [place] EB[place] of all UDS test(s) done:  Lab Results  Component Value Date   SUMMARY Note 11/04/2020   Last UDS on record: Summary  Date Value Ref Range Status  11/04/2020 Note  Final     Comment:    ==================================================================== Compliance Drug Analysis, Ur ==================================================================== Test                             Result       Flag       Units  Drug Present not Declared for Prescription Verification   Carboxy-THC                    90           UNEXPECTED ng/mg creat    Carboxy-THC is a metabolite of tetrahydrocannabinol (THC). Source of    THC is most commonly herbal marijuana or marijuana-based products,    but THC is also present in a scheduled prescription medication.    Trace amounts of THC can be present in hemp and cannabidiol (CBD)    products. This test is not intended to distinguish between delta-9-    tetrahydrocannabinol, the predominant form of THC in most herbal or    marijuana-based products, and delta-8-tetrahydrocannabinol.    Gabapentin                     PRESENT      UNEXPECTED   Salicylate  PRESENT      UNEXPECTED ==================================================================== Test                      Result    Flag   Units      Ref Range   Creatinine              84               mg/dL      >=20 ==================================================================== Declared Medications:  The flagging and interpretation on this report are based on the  following declared medications.  Unexpected results may arise from  inaccuracies in the declared medications.   **Note: The testing scope of this panel does not include the  following reported medications:   Ciclopirox (Loprox)  Fluticasone (Advair)  Hydroxychloroquine (Plaquenil)  Omeprazole (Prilosec)  Salmeterol (Advair) ==================================================================== For clinical consultation, please call [phone]. ====================================================================    UDS interpretation: Unexpected findings: Undeclared illicit substance  detected Medication Assessment Form: Not applicable. No opioids. Treatment compliance: Not applicable Risk Assessment Profile: Aberrant behavior: use of illicit substances Comorbid factors increasing risk of overdose: age 72-64 years old and caucasian Opioid risk tool (ORT):  Opioid Risk  02/03/2021  Alcohol 0  Illegal Drugs 0  Rx Drugs 0  Alcohol 0  Illegal Drugs 0  Rx Drugs 0  Age between 16-45 years  0  History of Preadolescent Sexual Abuse 0  Psychological Disease 0  Depression 1  Opioid Risk Tool Scoring 1  Opioid Risk Interpretation Low Risk    ORT Scoring interpretation table:  Score <3 = Low Risk for SUD  Score between 4-7 = Moderate Risk for SUD  Score >8 = High Risk for Opioid Abuse   Risk of substance use disorder (SUD): High-to-Very High  Risk Mitigation Strategies:  Patient opioid safety counseling: No controlled substances prescribed. Patient-Prescriber Agreement (PPA): No agreement signed.  Controlled substance notification to other providers: None required. No opioid therapy.  Pharmacologic Plan: No opioid analgesic prescribed.             Laboratory Chemistry Profile   Renal Lab Results  Component Value Date   BUN 17 01/14/2021   CREATININE 0.99 01/14/2021   BCR 17 01/14/2021   GFRAA 92 02/02/2020   GFRNONAA >60 11/15/2020   PROTEINUR NEGATIVE 02/10/2019     Electrolytes Lab Results  Component Value Date   NA 141 01/14/2021   K 4.7 01/14/2021   CL 102 01/14/2021   CALCIUM 9.5 01/14/2021   MG 2.1 11/04/2020   PHOS 4.2 09/22/2015     Hepatic Lab Results  Component Value Date   AST 23 01/14/2021   ALT 12 01/14/2021   ALBUMIN 4.5 01/14/2021   ALKPHOS 53 01/14/2021   LIPASE 34 12/31/2014     ID Lab Results  Component Value Date   HIV Non Reactive 01/14/2021   SARSCOV2NAA POSITIVE (A) 11/15/2020   STAPHAUREUS POSITIVE (A) 02/10/2019   MRSAPCR POSITIVE (A) 02/10/2019     Bone Lab Results  Component Value Date   25OHVITD1 22 (L)  11/04/2020   25OHVITD2 <1.0 11/04/2020   25OHVITD3 22 11/04/2020     Endocrine Lab Results  Component Value Date   GLUCOSE 111 (H) 01/14/2021   GLUCOSEU NEGATIVE 02/10/2019   TSH 1.07 02/02/2020     Neuropathy Lab Results  Component Value Date   VITAMINB12 331 11/04/2020   HIV Non Reactive 01/14/2021     CNS No results found for: COLORCSF, APPEARCSF, RBCCOUNTCSF,  WBCCSF, POLYSCSF, LYMPHSCSF, EOSCSF, PROTEINCSF, GLUCCSF, JCVIRUS, CSFOLI, IGGCSF, LABACHR, ACETBL, LABACHR, ACETBL   Inflammation (CRP: Acute  ESR: Chronic) Lab Results  Component Value Date   CRP <1 11/04/2020   ESRSEDRATE 36 (H) 11/04/2020     Rheumatology Lab Results  Component Value Date   RF 17 (H) 02/02/2020   ANA NEGATIVE 02/02/2020     Coagulation Lab Results  Component Value Date   INR 0.9 02/10/2019   LABPROT 12.5 02/10/2019   APTT 27 02/10/2019   PLT 193 01/14/2021     Cardiovascular Lab Results  Component Value Date   HGB 15.7 01/14/2021   HCT 47.9 01/14/2021     Screening Lab Results  Component Value Date   SARSCOV2NAA POSITIVE (A) 11/15/2020   STAPHAUREUS POSITIVE (A) 02/10/2019   MRSAPCR POSITIVE (A) 02/10/2019   HIV Non Reactive 01/14/2021     Cancer No results found for: CEA, CA125, LABCA2   Allergens No results found for: ALMOND, APPLE, ASPARAGUS, AVOCADO, BANANA, BARLEY, BASIL, BAYLEAF, GREENBEAN, LIMABEAN, WHITEBEAN, BEEFIGE, REDBEET, BLUEBERRY, BROCCOLI, CABBAGE, MELON, CARROT, CASEIN, CASHEWNUT, CAULIFLOWER, CELERY     Note: Lab results reviewed.  Recent Diagnostic Imaging Review  Cervical Imaging: Cervical MR wo contrast: Results for orders placed during the hospital encounter of 01/11/19 MR Cervical Spine Wo Contrast  Narrative CLINICAL DATA:  Initial evaluation for intermittent bilateral arm numbness and weakness for 1 week, headaches.  EXAM: MRI HEAD WITHOUT CONTRAST  MRI CERVICAL SPINE WITHOUT CONTRAST  TECHNIQUE: Multiplanar, multiecho pulse  sequences of the brain and surrounding structures, and cervical spine, to include the craniocervical junction and cervicothoracic junction, were obtained without intravenous contrast.  COMPARISON:  Prior MRI from 04/26/2015.  FINDINGS: MRI HEAD FINDINGS  Brain: Cerebral volume within normal limits for patient age. Few scattered subcentimeter T2/FLAIR hyperintensities noted within the periventricular and deep white matter both cerebral hemispheres, nonspecific, but felt to be within normal limits for age.  No abnormal foci of restricted diffusion to suggest acute or subacute ischemia. Gray-white matter differentiation well maintained. No encephalomalacia to suggest chronic infarction. No foci of susceptibility artifact to suggest acute or chronic intracranial hemorrhage.  No mass lesion, midline shift or mass effect. No hydrocephalus. No extra-axial fluid collection. Major dural sinuses are grossly patent.  Pituitary gland and suprasellar region are normal. Midline structures intact and normal.  Vascular: Major intracranial vascular flow voids well maintained and normal in appearance.  Skull and upper cervical spine: Craniocervical junction normal. Visualized upper cervical spine within normal limits. Bone marrow signal intensity normal. No scalp soft tissue abnormality.  Sinuses/Orbits: Globes and orbital soft tissues within normal limits.  Paranasal sinuses are clear. Trace left mastoid effusion noted, of doubtful significance. Inner ear structures normal.  Other: None.  MRI CERVICAL SPINE FINDINGS  Alignment: Straightening of the normal cervical lordosis. No listhesis.  Vertebrae: Vertebral body height maintained without evidence for acute or chronic fracture. Bone marrow signal intensity within normal limits. No discrete or worrisome osseous lesions. No abnormal marrow edema.  Cord: Signal intensity within the cervical spinal cord is normal.  Posterior Fossa,  vertebral arteries, paraspinal tissues: Craniocervical junction within normal limits. Paraspinous and prevertebral soft tissues within normal limits. Normal intravascular flow voids seen within the vertebral arteries bilaterally.  Disc levels:  C2-C3: Mild right-sided uncovertebral hypertrophy without significant disc bulge. Advanced right-sided facet degeneration. Resultant moderate right C3 foraminal stenosis. No significant canal or left foraminal narrowing.  C3-C4: Diffuse degenerative disc osteophyte with intervertebral disc space narrowing. Broad posterior component  with superimposed prominent central soft disc protrusion indents and effaces the ventral thecal sac. Secondary impingement and flattening of the cervical spinal cord without cord signal changes. Resultant severe spinal stenosis. Thecal sac measures 5 mm in AP diameter at its most narrow point. Associated severe left greater than right C4 foraminal stenosis.  C4-C5: Circumferential disc osteophyte with intervertebral disc space narrowing. Broad posterior component effaces the ventral thecal sac and resultant moderate to severe spinal stenosis. Mild cord flattening without cord signal changes. Thecal sac measures 7 mm in AP diameter. Moderate left worse than right C5 foraminal stenosis.  C5-C6: Circumferential disc osteophyte with intervertebral disc space narrowing. Broad posterior component slightly asymmetric to the right effaces the ventral thecal sac. Resultant severe spinal stenosis with associated cord flattening. No cord signal changes. Thecal sac measures 6 mm in AP diameter. Severe right worse than left C6 foraminal stenosis.  C6-C7: Diffuse disc osteophyte with intervertebral disc space narrowing. Right worse than left uncovertebral spurring. Broad posterior component effaces the ventral thecal sac resultant moderate to severe spinal stenosis. Mild cord flattening without cord signal changes. Thecal  sac measures 6 mm in AP diameter at its most narrow point. Severe right worse than left C7 foraminal stenosis.  C7-T1: Negative interspace. Mild facet hypertrophy. No significant canal or foraminal stenosis.  Visualized upper thoracic spine demonstrates mild disc bulging at T1-2 and T2-3 without significant stenosis.  IMPRESSION: MRI HEAD IMPRESSION:  Normal brain MRI for age. No acute intracranial abnormality identified.  MRI CERVICAL SPINE IMPRESSION:  1. Advanced multilevel cervical spondylolysis at C3-4 through C6-7 with resultant moderate to severe diffuse spinal stenosis. Secondary cord flattening and impingement at multiple levels without cord signal changes. 2. Multifactorial degenerative changes with resultant multilevel foraminal narrowing as above. Notable findings include moderate right C3 foraminal stenosis, severe bilateral C4 foraminal narrowing, moderate bilateral C5 foraminal stenosis, with severe bilateral C6 and C7 foraminal stenosis.   Electronically Signed By: Jeannine Boga M.D. On: 01/11/2019 23:59  Cervical DG Bending/F/E views: Results for orders placed during the hospital encounter of 11/04/20 DG Cervical Spine With Flex & Extend  Narrative CLINICAL DATA:  Cervicalgia  EXAM: CERVICAL SPINE COMPLETE WITH FLEXION AND EXTENSION VIEWS  COMPARISON:  02/16/2019  FINDINGS: No fracture or static subluxation of the cervical spine. Redemonstrated anterior cervical discectomy and fusion of C3 through C7, with interval bony incorporation of the disc spaces. There is essentially no excursion on flexion or extension as expected with this degree of cervical fusion. No listhesis. There is at least mild bilateral bony neural foraminal stenosis at multiple cervical levels secondary to uncovertebral change, particularly C5-C6 and C6-C7. The partially imaged skull base, cervical soft tissues, and upper chest are unremarkable.  IMPRESSION: 1.  No  fracture or static subluxation of the cervical spine.  2. Redemonstrated anterior cervical discectomy and fusion of C3 through C7, with interval bony incorporation of the disc spaces.  3. There is essentially no excursion on flexion or extension as expected with this degree of cervical fusion. No listhesis.  4. There is at least mild bilateral bony neural foraminal stenosis at multiple cervical levels secondary to uncovertebral change, particularly C5-C6 and C6-C7.   Electronically Signed By: Eddie Candle M.D. On: 11/05/2020 14:26  Shoulder Imaging: Shoulder-R DG: Results for orders placed during the hospital encounter of 11/04/20 DG Shoulder Right  Narrative CLINICAL DATA:  Right shoulder pain  EXAM: RIGHT SHOULDER - 2+ VIEW  COMPARISON:  None.  FINDINGS: There is no evidence of  fracture or dislocation. Mild arthropathy of the [facility] joint. Glenohumeral joint appears within normal limits. Soft tissues are unremarkable. Partially visualized cervical fusion hardware.  IMPRESSION: Mild arthropathy of the right AC joint.   Electronically Signed By: Davina Poke D.O. On: 11/05/2020 14:22  Shoulder-L DG: Results for orders placed during the hospital encounter of 11/04/20 DG Shoulder Left  Narrative CLINICAL DATA:  Chronic shoulder pain  EXAM: LEFT SHOULDER - 2+ VIEW  COMPARISON:  None.  FINDINGS: There is no evidence of fracture or dislocation. Moderate acromioclavicular arthrosis. The glenohumeral joint is intact. Soft tissues are unremarkable.  IMPRESSION: 1.  No fracture or dislocation of the left shoulder.  2. Moderate acromioclavicular arthrosis. The glenohumeral joint is intact.   Electronically Signed By: Eddie Candle M.D. On: 11/05/2020 14:22  Thoracic Imaging: Thoracic MR wo contrast: Results for orders placed during the hospital encounter of 07/25/20 MR THORACIC SPINE WO CONTRAST  Narrative CLINICAL DATA:  Initial evaluation for chronic  back pain with radiation into the lower extremities and thighs bilaterally.  EXAM: MRI THORACIC SPINE WITHOUT CONTRAST  TECHNIQUE: Multiplanar, multisequence MR imaging of the thoracic spine was performed. No intravenous contrast was administered.  COMPARISON:  None available.  FINDINGS: Alignment: Straightening of the normal upper and midthoracic kyphosis, with slight exaggeration of the kyphotic curvature at the thoracolumbar junction. 4 mm retrolisthesis of T12 on L1. No other listhesis or static subluxation.  Vertebrae: Vertebral body height maintained without acute or chronic fracture. Bone marrow signal intensity within normal limits. No worrisome osseous lesions. Discogenic reactive endplate change with associated marrow edema present about the T11-12 and T12-L1 interspaces. More mild changes noted at T3-4 as well. No other abnormal marrow edema.  Cord:  Normal signal and morphology.  Paraspinal and other soft tissues: Paraspinous soft tissues within normal limits. Partially visualized lungs are largely clear. Visualized visceral structures are normal.  Disc levels:  T1-2: Minimal disc bulge. No spinal stenosis. Foramina remain patent.  T2-3: Mild disc bulge.  No spinal stenosis.  Foramina remain patent.  T3-4: Diffuse disc bulge with intervertebral disc space narrowing and reactive endplate change. Mild facet hypertrophy. No significant spinal stenosis. Mild to moderate right foraminal narrowing. Left neural foramina remains patent.  T4-5: Mild disc bulge with disc desiccation and intervertebral disc space narrowing. Mild facet hypertrophy. No spinal stenosis. Mild right foraminal narrowing. Left neural foramina remains patent.  T5-6: Broad-based right paracentral disc protrusion flattens and partially effaces the ventral thecal sac (series 22, image 17). Secondary flattening of the right hemi cord without cord signal changes. Mild facet hypertrophy. No  significant spinal stenosis. Foramina remain patent.  T6-7: Right paracentral disc protrusion indents the right ventral thecal sac and contacts the right ventral cord (series 21, image 20). Flattening of the right hemi cord without cord signal changes or significant spinal stenosis. Foramina remain patent.  T7-8: Broad-based right paracentral disc protrusion flattens and effaces the ventral thecal sac. Mild cord flattening without cord signal changes or significant spinal stenosis. Superimposed bilateral facet hypertrophy. Foramina remain patent.  T8-9: Diffuse disc bulge. Lobulated broad-based posterior disc protrusion indents and partially effaces the ventral thecal sac, greater on the left (series 21, image 26). Mild flattening of the ventral cord without cord signal changes. Mild facet hypertrophy. No significant spinal stenosis. Foramina remain patent.  T9-10: Disc desiccation without significant disc bulge. Posterior element hypertrophy. No stenosis.  T10-11: Disc desiccation without significant disc bulge. No stenosis.  T11-12: Degenerative intervertebral disc space narrowing with mild  disc bulge. Prominent reactive endplate change with associated marrow edema. Mild left greater than right facet hypertrophy. No spinal stenosis. Mild bilateral foraminal narrowing, greater on the left.  T12-L1: Retrolisthesis. Degenerative intervertebral disc space narrowing with diffuse disc bulge and disc desiccation. Prominent reactive endplate change with associated marrow edema. No spinal stenosis. Mild bilateral foraminal narrowing.  IMPRESSION: 1. Multilevel thoracic spondylosis with multifocal disc protrusions at T3-4 through T8-9 as above. No significant spinal stenosis. 2. Mild to moderate right foraminal narrowing at T3-4 and mild bilateral foraminal stenosis at T11-12 and T12-L1 related to disc bulge and facet degeneration. 3. Prominent reactive endplate change with  associated marrow edema about the T11-12 and T12-L1 interspaces. Finding could serve as a source for back pain.   Electronically Signed By: Jeannine Boga M.D. On: 07/26/2020 03:36  Lumbosacral Imaging: Lumbar MR wo contrast: Results for orders placed during the hospital encounter of 07/25/20 MR LUMBAR SPINE WO CONTRAST  Narrative CLINICAL DATA:  Initial evaluation for chronic lower back pain with radiation into both lower extremities.  EXAM: MRI LUMBAR SPINE WITHOUT CONTRAST  TECHNIQUE: Multiplanar, multisequence MR imaging of the lumbar spine was performed. No intravenous contrast was administered.  COMPARISON:  Previous radiograph from 03/04/2011.  FINDINGS: Segmentation: Standard. Lowest well-formed disc space labeled the L5-S1 level.  Alignment: 4 mm retrolisthesis of T12 on L1. Trace retrolisthesis of L1 on L2, with trace anterolisthesis of L5 on S1. Findings chronic and facet mediated.  Vertebrae: Vertebral body height maintained without acute or chronic fracture. Underlying bone marrow signal intensity within normal limits. No worrisome osseous lesions. Prominent discogenic reactive endplate change with associated marrow edema noted about the T11-12 and T12-L1 interspaces. No other abnormal marrow edema.  Conus medullaris and cauda equina: Conus extends to the L1 level. Conus and cauda equina appear normal.  Paraspinal and other soft tissues: Paraspinous soft tissues within normal limits. 1.5 cm simple cyst noted within the interpolar right kidney. Partially visualized cystic structure at the right lower quadrant favored to reflect a portion of the cecum. Visualized visceral structures otherwise unremarkable.  Disc levels:  L1-2: Diffuse disc bulge with disc desiccation. Mild facet hypertrophy. No significant spinal stenosis. Foramina remain patent.  L2-3: Diffuse disc bulge with disc desiccation. Superimposed right subarticular to extraforaminal  disc protrusion (series 27, image 17). Associated slight inferior migration at the level of the right lateral recess (series 23, image 8). Annular fissure. Mild facet hypertrophy. Resultant mild canal with bilateral lateral recess stenosis. Mild bilateral L2 foraminal narrowing.  L3-4: Diffuse disc bulge with disc desiccation and intervertebral disc space narrowing. Mild facet and ligament flavum hypertrophy. Resultant moderate canal with moderate bilateral L3 foraminal stenosis.  L4-5: Diffuse disc bulge with disc desiccation. Superimposed shallow central disc protrusion with annular fissure. Mild left worse than right facet hypertrophy. Resultant mild canal with mild left lateral recess stenosis. Mild to moderate right with moderate left L4 foraminal narrowing.  L5-S1: Trace anterolisthesis. Broad-based right foraminal disc protrusion with associated annular fissure (series 26, image 32). Severe right worse than left facet degeneration. No spinal stenosis. Moderate right with mild left L5 foraminal narrowing.  IMPRESSION: 1. Disc bulge with facet hypertrophy at L3-4 with resultant moderate canal and moderate bilateral L3 foraminal stenosis. 2. Broad-based right foraminal disc protrusion at L5-S1 with resultant moderate right L5 foraminal stenosis. Disc bulging with facet hypertrophy at L4-5 with resultant mild left lateral recess stenosis, with mild to moderate left worse than right L4 foraminal narrowing. 3. Prominent discogenic reactive  endplate change with associated marrow edema about the T11-12 and T12-L1 interspaces. Finding could contribute to underlying back pain. Could contribute to underlying back pain.   Electronically Signed By: Jeannine Boga M.D. On: 07/26/2020 03:49  Lumbar DG Bending views: Results for orders placed during the hospital encounter of 11/04/20 DG Lumbar Spine Complete W/Bend  Narrative CLINICAL DATA:  Chronic low back  pain  EXAM: LUMBAR SPINE - COMPLETE WITH BENDING VIEWS  COMPARISON:  None.  FINDINGS: No fracture or dislocation of the lumbar spine. There is a somewhat exaggerated lumbar lordosis with focally moderate disc space height loss and thoracolumbar kyphosis at T11 through L1. There is mild lumbar disc space height loss and osteophytosis and mild multilevel facet degenerative change. Limited excursion on flexion and extension views without appreciable listhesis. Aortic atherosclerosis. Nonobstructive pattern of overlying bowel gas.  IMPRESSION: 1. No fracture or dislocation of the lumbar spine. 2. There is a somewhat exaggerated lumbar lordosis with focally moderate disc space height loss and thoracolumbar kyphosis at T11 through L1. 3. There is otherwise mild lumbar disc space height loss and osteophytosis and mild multilevel facet degenerative change. 4. Limited excursion on flexion and extension views without appreciable listhesis.   Electronically Signed By: Eddie Candle M.D. On: 11/05/2020 14:32  Wrist Imaging: Wrist-R DG Complete: Results for orders placed during the hospital encounter of 11/04/20 DG Wrist Complete Right  Narrative CLINICAL DATA:  Chronic bilateral wrist pain  EXAM: LEFT WRIST - COMPLETE 3+ VIEW; RIGHT WRIST - COMPLETE 3+ VIEW  COMPARISON:  None.  FINDINGS: There is no evidence of fracture or dislocation. Minimal carpal and radiocarpal arthrosis bilaterally. Soft tissues are unremarkable.  IMPRESSION: 1. No fracture or dislocation of the bilateral wrists. The carpi are normally aligned.  2.  Minimal carpal and radiocarpal arthrosis bilaterally.   Electronically Signed By: Eddie Candle M.D. On: 11/05/2020 14:42  Wrist-L DG Complete: Results for orders placed during the hospital encounter of 11/04/20 DG Wrist Complete Left  Narrative CLINICAL DATA:  Chronic bilateral wrist pain  EXAM: LEFT WRIST - COMPLETE 3+ VIEW; RIGHT WRIST -  COMPLETE 3+ VIEW  COMPARISON:  None.  FINDINGS: There is no evidence of fracture or dislocation. Minimal carpal and radiocarpal arthrosis bilaterally. Soft tissues are unremarkable.  IMPRESSION: 1. No fracture or dislocation of the bilateral wrists. The carpi are normally aligned.  2.  Minimal carpal and radiocarpal arthrosis bilaterally.   Electronically Signed By: Eddie Candle M.D. On: 11/05/2020 14:42  Hand Imaging: Hand-R DG Complete: Results for orders placed during the hospital encounter of 04/26/18 DG Hand Complete Right  Narrative CLINICAL DATA:  Right greater than left hand pain and swelling in the region of the MCP joints for 5 years.  EXAM: RIGHT HAND-COMPLETE 3+ VIEW  COMPARISON:  None.  FINDINGS: No acute fracture or dislocation is identified. Mild to moderate degenerative marginal osteophyte formation is noted at multiple PIP and DIP joints, most notably the index and long fingers. No osseous erosion or soft tissue swelling is identified.  IMPRESSION: Mild to moderate osteoarthrosis in the IP joints without evidence of erosive arthropathy or acute osseous abnormality.   Electronically Signed By: Logan Bores M.D. On: 04/26/2018 16:17  Hand-L DG Complete: Results for orders placed during the hospital encounter of 04/26/18 DG Hand Complete Left  Narrative CLINICAL DATA:  Right greater than left hand pain and swelling in the region of the MCP joints for 5 years.  EXAM: LEFT HAND - COMPLETE 3+ VIEW  COMPARISON:  None.  FINDINGS: A mild angular deformity of the fifth metacarpal likely represents an old, healed fracture. No acute fracture or dislocation is identified. Mild degenerative marginal osteophyte formation is noted at multiple PIP and DIP joints in the fingers. No osseous erosion or soft tissue swelling is identified.  IMPRESSION: Mild osteoarthrosis in the IP joints without evidence of erosive arthropathy or acute osseous  abnormality.   Electronically Signed By: Logan Bores M.D. On: 04/26/2018 16:15  Complexity Note: Imaging results reviewed. Results shared with Mr. Mcnealy, using Layman's terms.                        Meds   Current Outpatient Medications:    ciclopirox (LOPROX) 0.77 % cream, Apply topically as directed. Qd to bid to hands, feet, and around nails, Disp: 90 g, Rfl: 3   diazepam (VALIUM) 10 MG tablet, Take 1 tablet (10 mg total) by mouth 60 (sixty) minutes before procedure for 1 dose. Take with a sip of water, on an empty stomach. Do not eat anything for 6 hours prior to procedure., Disp: 1 tablet, Rfl: 0   fluticasone-salmeterol (ADVAIR DISKUS) 250-50 MCG/ACT AEPB, Inhale 1 puff into the lungs in the morning and at bedtime., Disp: 60 each, Rfl: 11   hydroxychloroquine (PLAQUENIL) 200 MG tablet, Take 200 mg by mouth 2 (two) times daily., Disp: , Rfl:    Ixekizumab (TALTZ) 80 MG/ML SOAJ, Inject 160 mg into the skin as directed. Inject 160 mg (2 x 80 mg) subcutaneous at week 0, then begin first induction dose 80 mg (1 x 80 mg) 2 weeks later (week 2)., Disp: 3 mL, Rfl: 0   Ixekizumab (TALTZ) 80 MG/ML SOAJ, Inject 80 mg into the skin every 14 (fourteen) days. Weeks 4-10., Disp: 2 mL, Rfl: 1   Ixekizumab (TALTZ) 80 MG/ML SOAJ, Inject 80 mg into the skin every 28 (twenty-eight) days. For maintenance., Disp: 1 mL, Rfl: 3   omeprazole (PRILOSEC OTC) 20 MG tablet, Take 20 mg by mouth every morning., Disp: , Rfl:    ondansetron (ZOFRAN) 4 MG tablet, Take 1 tablet (4 mg total) by mouth daily as needed., Disp: 14 tablet, Rfl: 0   polyethylene glycol-electrolytes (GAVILYTE-N WITH FLAVOR PACK) 420 g solution, Drink one 8 oz glass every 20 mins until entire container is finished starting at 5:00pm on 02/13/21, Disp: 4000 mL, Rfl: 0  ROS  Constitutional: Denies any fever or chills Gastrointestinal: No reported hemesis, hematochezia, vomiting, or acute GI distress Musculoskeletal: Denies any acute onset joint  swelling, redness, loss of ROM, or weakness Neurological: No reported episodes of acute onset apraxia, aphasia, dysarthria, agnosia, amnesia, paralysis, loss of coordination, or loss of consciousness  Allergies  Mr. Delarocha has No Known Allergies.  PFSH  Drug: Mr. Minner  reports no history of drug use. Alcohol:  reports no history of alcohol use. Tobacco:  reports that he has been smoking cigarettes. He has a 42.00 pack-year smoking history. He has never used smokeless tobacco. Medical:  has a past medical history of Arthritis, COPD (chronic obstructive pulmonary disease) ([place]), Diverticulitis, Generalized headaches, GERD (gastroesophageal reflux disease), History of colonoscopy with polypectomy (09/08/2018), Lower back pain, Rectal pain, and Thrombosed hemorrhoids. Surgical: Mr. Cromwell  has a past surgical history that includes Cholecystectomy (1989); Tonsillectomy (1970); Appendectomy (1988); Colonoscopy with propofol (N/A, 08/16/2015); polypectomy (08/16/2015); Laparoscopic partial colectomy (N/A, 09/11/2015); Cystoscopy with stent placement (Bilateral, 09/11/2015); Bladder repair (N/A, 09/11/2015); Ileostomy closure (N/A, 11/19/2015); Colon surgery;  and Anterior cervical decompression/discectomy fusion 4 level (N/A, 02/15/2019). Family: family history includes Cholecystitis in his mother; Diabetes in his brother and paternal grandmother; Emphysema in his paternal grandmother; Hypertension in his brother; Lung cancer in his father; Prostate cancer in his maternal uncle.  Constitutional Exam  General appearance: Well nourished, well developed, and well hydrated. In no apparent acute distress Vitals:   02/03/21 1345  BP: (!) 143/82  Pulse: 86  Resp: 16  Temp: (!) 96.8 F (36 C)  TempSrc: Temporal  SpO2: 98%  Weight: 213 lb (96.6 kg)  Height: _0  (1.753 m)   BMI Assessment: Estimated body mass index is 31.45 kg/m as calculated from the following:   Height as of this encounter: _1  (1.753  m).   Weight as of this encounter: 213 lb (96.6 kg).  BMI interpretation table: BMI level Category Range association with higher incidence of chronic pain  <18 kg/m2 Underweight   18.5-24.9 kg/m2 Ideal body weight   25-29.9 kg/m2 Overweight Increased incidence by 20%  30-34.9 kg/m2 Obese (Class I) Increased incidence by 68%  35-39.9 kg/m2 Severe obesity (Class II) Increased incidence by 136%  >40 kg/m2 Extreme obesity (Class III) Increased incidence by 254%   Patient's current BMI Ideal Body weight  Body mass index is 31.45 kg/m. Ideal body weight: 70.7 kg (155 lb 13.8 oz) Adjusted ideal body weight: 81.1 kg (178 lb 11.5 oz)   BMI Readings from Last 4 Encounters:  02/03/21 31.45 kg/m  01/17/21 30.25 kg/m  11/20/20 30.13 kg/m  11/15/20 33.45 kg/m   Wt Readings from Last 4 Encounters:  02/03/21 213 lb (96.6 kg)  01/17/21 210 lb 12.8 oz (95.6 kg)  11/20/20 210 lb (95.3 kg)  11/15/20 220 lb (99.8 kg)    Psych/Mental status: Alert, oriented x 3 (person, place, & time)       Eyes: PERLA Respiratory: No evidence of acute respiratory distress  Assessment & Plan  Primary Diagnosis & Pertinent Problem List: The primary encounter diagnosis was Chronic pain syndrome. Diagnoses of Chronic low back pain (1ry area of Pain) (Bilateral) (R>L) w/o sciatica, DDD (degenerative disc disease), cervical, Lumbar facet hypertrophy, Lumbosacral facet joint syndrome, Spondylosis without myelopathy or radiculopathy, lumbosacral region, Chronic shoulder pain (2ry area of Pain) (Bilateral) (R>L), Osteoarthritis of AC (acromioclavicular) joint (Right), Osteoarthritis of AC (acromioclavicular) joint (Left), Chronic neck pain (3ry area of Pain) (Bilateral) (R>L) w/ history of cervical spinal surgery, Cervical fusion syndrome (ACDF C3-7), Cervical foraminal stenosis (Multilevel) (Bilateral), Cervicogenic headaches (4th area of Pain) (Bilateral) (R>L), Chronic upper extremity pain (5th area of Pain) (Bilateral)  (R>L), Chronic knee pain (6th area of Pain) (Bilateral) (R>L), Chronic wrist pain (7th area of Pain) (Bilateral) (R>L), Abnormal MRI, cervical spine (01/11/2019), Abnormal MRI, lumbar spine (07/26/2020), Abnormal MRI, thoracic spine (07/26/2020), Elevated sed rate, and Vitamin D insufficiency were also pertinent to this visit.  Visit Diagnosis: 1. Chronic pain syndrome   2. Chronic low back pain (1ry area of Pain) (Bilateral) (R>L) w/o sciatica   3. DDD (degenerative disc disease), cervical   4. Lumbar facet hypertrophy   5. Lumbosacral facet joint syndrome   6. Spondylosis without myelopathy or radiculopathy, lumbosacral region   7. Chronic shoulder pain (2ry area of Pain) (Bilateral) (R>L)   8. Osteoarthritis of AC (acromioclavicular) joint (Right)   9. Osteoarthritis of AC (acromioclavicular) joint (Left)   10. Chronic neck pain (3ry area of Pain) (Bilateral) (R>L) w/ history of cervical spinal surgery   11. Cervical fusion syndrome (ACDF C3-7)  12. Cervical foraminal stenosis (Multilevel) (Bilateral)   13. Cervicogenic headaches (4th area of Pain) (Bilateral) (R>L)   14. Chronic upper extremity pain (5th area of Pain) (Bilateral) (R>L)   15. Chronic knee pain (6th area of Pain) (Bilateral) (R>L)   16. Chronic wrist pain (7th area of Pain) (Bilateral) (R>L)   17. Abnormal MRI, cervical spine (01/11/2019)   18. Abnormal MRI, lumbar spine (07/26/2020)   19. Abnormal MRI, thoracic spine (07/26/2020)   20. Elevated sed rate   21. Vitamin D insufficiency    Problems updated and reviewed during this visit: Problem  Ddd (Degenerative Disc Disease), Cervical  Osteoarthritis of AC (acromioclavicular) joint (Right)  Osteoarthritis of AC (acromioclavicular) joint (Left)  Cervical foraminal stenosis (Multilevel) (Bilateral)   (01/11/2019) MRI CERVICAL SPINE IMPRESSION:  Moderate right C3 foraminal stenosis, severe bilateral C4 foraminal narrowing, moderate bilateral C5 foraminal stenosis, with  severe bilateral C6 and C7 foraminal stenosis  ACDF on 02/15/2019.   Lumbar facet hypertrophy  Lumbosacral Facet Joint Syndrome  Spondylosis Without Myelopathy Or Radiculopathy, Lumbosacral Region  Cervical fusion syndrome (ACDF C3-7)   ACDF C3-7 by Dr. Cari Caraway (02/15/2019)    Elevated Sed Rate  Vitamin D Insufficiency    Plan of Care  Pharmacotherapy (Medications Ordered): Meds ordered this encounter  Medications   diazepam (VALIUM) 10 MG tablet    Sig: Take 1 tablet (10 mg total) by mouth 60 (sixty) minutes before procedure for 1 dose. Take with a sip of water, on an empty stomach. Do not eat anything for 6 hours prior to procedure.    Dispense:  1 tablet    Refill:  0    Procedure Orders  LUMBAR FACET(MEDIAL BRANCH NERVE BLOCK) MBNB   Lab Orders  No laboratory test(s) ordered today   Imaging Orders  No imaging studies ordered today   Referral Orders  No referral(s) requested today    Pharmacological management options:  Opioid Analgesics: The patient's 11/04/2020 UDS was positive for unreported cannabinoids.  We will not be offering any opioid analgesics or any other long-term controlled substances to this patient as part of his long-term therapy. Membrane stabilizer: None prescribed at this time Muscle relaxant: None prescribed at this time NSAID: None prescribed at this time.  The patient is already seen a rheumatologist (Dr. Posey Pronto). Other analgesic(s): None prescribed at this time      Interventional Therapies  Risk  Complexity Considerations:   Estimated body mass index is 31.45 kg/m as calculated from the following:   Height as of this encounter: _0  (1.753 m).   Weight as of this encounter: 213 lb (96.6 kg). NO OPIOIDS!!! - Abnormal UDS (11/04/2020): Positive for marijuana   Planned  Pending:   Pending further evaluation   Under consideration:   Diagnostic bilateral lumbar facet block #1    Completed:   None at this time   Therapeutic   Palliative (PRN) options:   None established    Provider-requested follow-up: Return for Procedure (Oral sedation): (B) L-FCT BLK #1. Recent Visits No visits were found meeting these conditions. Showing recent visits within past 90 days and meeting all other requirements Today's Visits Date Type Provider Dept  02/03/21 Office Visit Milinda Pointer, MD [facility]  Showing today's visits and meeting all other requirements Future Appointments No visits were found meeting these conditions. Showing future appointments within next 90 days and meeting all other requirements Primary Care Physician: Towanda Malkin, MD Note by: Gaspar Cola, MD Date: 02/03/2021; Time: 2:56  PM

## 2021-02-03 ENCOUNTER — Ambulatory Visit: Payer: BC Managed Care – PPO | Admitting: Pain Medicine

## 2021-02-03 ENCOUNTER — Other Ambulatory Visit: Payer: Self-pay

## 2021-02-03 ENCOUNTER — Encounter: Payer: Self-pay | Admitting: Pain Medicine

## 2021-02-03 ENCOUNTER — Ambulatory Visit: Payer: BC Managed Care – PPO | Attending: Pain Medicine | Admitting: Pain Medicine

## 2021-02-03 VITALS — BP 143/82 | HR 86 | Temp 96.8°F | Resp 16 | Ht 69.0 in | Wt 213.0 lb

## 2021-02-03 DIAGNOSIS — M25561 Pain in right knee: Secondary | ICD-10-CM | POA: Insufficient documentation

## 2021-02-03 DIAGNOSIS — M4802 Spinal stenosis, cervical region: Secondary | ICD-10-CM | POA: Diagnosis not present

## 2021-02-03 DIAGNOSIS — Q761 Klippel-Feil syndrome: Secondary | ICD-10-CM | POA: Diagnosis not present

## 2021-02-03 DIAGNOSIS — M542 Cervicalgia: Secondary | ICD-10-CM | POA: Insufficient documentation

## 2021-02-03 DIAGNOSIS — M47817 Spondylosis without myelopathy or radiculopathy, lumbosacral region: Secondary | ICD-10-CM | POA: Insufficient documentation

## 2021-02-03 DIAGNOSIS — M25511 Pain in right shoulder: Secondary | ICD-10-CM | POA: Insufficient documentation

## 2021-02-03 DIAGNOSIS — M79602 Pain in left arm: Secondary | ICD-10-CM | POA: Diagnosis present

## 2021-02-03 DIAGNOSIS — M25532 Pain in left wrist: Secondary | ICD-10-CM | POA: Diagnosis present

## 2021-02-03 DIAGNOSIS — R937 Abnormal findings on diagnostic imaging of other parts of musculoskeletal system: Secondary | ICD-10-CM | POA: Diagnosis present

## 2021-02-03 DIAGNOSIS — G894 Chronic pain syndrome: Secondary | ICD-10-CM | POA: Diagnosis not present

## 2021-02-03 DIAGNOSIS — M5030 Other cervical disc degeneration, unspecified cervical region: Secondary | ICD-10-CM | POA: Insufficient documentation

## 2021-02-03 DIAGNOSIS — G8928 Other chronic postprocedural pain: Secondary | ICD-10-CM | POA: Diagnosis not present

## 2021-02-03 DIAGNOSIS — G4486 Cervicogenic headache: Secondary | ICD-10-CM | POA: Insufficient documentation

## 2021-02-03 DIAGNOSIS — E559 Vitamin D deficiency, unspecified: Secondary | ICD-10-CM | POA: Insufficient documentation

## 2021-02-03 DIAGNOSIS — Z9889 Other specified postprocedural states: Secondary | ICD-10-CM | POA: Insufficient documentation

## 2021-02-03 DIAGNOSIS — R700 Elevated erythrocyte sedimentation rate: Secondary | ICD-10-CM | POA: Insufficient documentation

## 2021-02-03 DIAGNOSIS — M19012 Primary osteoarthritis, left shoulder: Secondary | ICD-10-CM | POA: Diagnosis not present

## 2021-02-03 DIAGNOSIS — M25531 Pain in right wrist: Secondary | ICD-10-CM | POA: Diagnosis present

## 2021-02-03 DIAGNOSIS — M19011 Primary osteoarthritis, right shoulder: Secondary | ICD-10-CM | POA: Insufficient documentation

## 2021-02-03 DIAGNOSIS — M25512 Pain in left shoulder: Secondary | ICD-10-CM | POA: Diagnosis not present

## 2021-02-03 DIAGNOSIS — M47816 Spondylosis without myelopathy or radiculopathy, lumbar region: Secondary | ICD-10-CM | POA: Insufficient documentation

## 2021-02-03 DIAGNOSIS — M545 Low back pain, unspecified: Secondary | ICD-10-CM | POA: Insufficient documentation

## 2021-02-03 DIAGNOSIS — G8929 Other chronic pain: Secondary | ICD-10-CM | POA: Insufficient documentation

## 2021-02-03 DIAGNOSIS — M79601 Pain in right arm: Secondary | ICD-10-CM | POA: Insufficient documentation

## 2021-02-03 DIAGNOSIS — M25562 Pain in left knee: Secondary | ICD-10-CM | POA: Diagnosis present

## 2021-02-03 MED ORDER — DIAZEPAM 10 MG PO TABS: 10.0000 mg | ORAL_TABLET | ORAL | 0 refills | Status: DC

## 2021-02-03 NOTE — Patient Instructions (Signed)
______________________________________________________________________  Preparing for Procedure with Oral Anxiolytics  Definition: Anxiolytics - Medications that provide muscle relaxation and decrease anxiety.  Procedure appointments are limited to planned procedures: No Prescription Refills. No disability issues will be discussed. No medication changes will be discussed.  Instructions: Oral Intake: Do not eat or drink anything for at least 6 hours prior to your procedure. (Exception: Blood Pressure Medication. See below.)  Anxiolytic Medication: This medication is meant to relax you during your procedure. DO NOT DRIVE OR OPERATE DANGEROUS MACHINERY WHILE UNDER THE INFLUENCE OF THIS MEDICATION. Take the oral medication (i.e.: Valium) as prescribed on the day of your procedure with just a sip of water. Prescription will be sent to your pharmacy, prior to the day of your procedure. Topical anesthetic: Your physician might have recommended a topical anesthetic/analgesic to apply over the area where the procedure will be done. If so, apply to area 1 hour prior to procedure. For exact location of application, ask your healthcare provider.    Transportation: A driver is required. You may not drive yourself after the procedure. Blood Pressure Medicine: Do not forget to take your blood pressure medicine with a sip of water the morning of the procedure. If your Diastolic (lower reading) is above 100 mmHg, elective cases will be cancelled/rescheduled. Blood thinners: These will need to be stopped for procedures. Notify our staff if you are taking any blood thinners. Depending on which one you take, there will be specific instructions on how and when to stop it. Diabetics on insulin: Notify the staff so that you can be scheduled 1st case in the morning. If your diabetes requires high dose insulin, take only  of your normal insulin dose the morning of the procedure and notify the staff that you have done  so. Preventing infections: Shower with an antibacterial soap the morning of your procedure. Build-up your immune system: Take 1000 mg of Vitamin C with every meal (3 times a day) the day prior to your procedure. Antibiotics: Inform the staff if you have a condition or reason that requires you to take antibiotics before dental procedures. Pregnancy: If you are pregnant, call and cancel the procedure. Sickness: If you have a cold, fever, or any active infections, call and cancel the procedure. Arrival: You must be in the facility at least 30 minutes prior to your scheduled procedure. Children: Do not bring children with you. Dress appropriately: Bring dark clothing that you would not mind if they get stained. Valuables: Do not bring any jewelry or valuables.  Reasons to call and reschedule or cancel your procedure:  NOTE: Following these recommendations will minimize the risk of a serious complication. Surgeries: Avoid having procedures within 2 weeks of any surgery. (Avoid for 2 weeks before or after any surgery). Flu Shots: Avoid having procedures within 2 weeks of a flu shots. (Avoid for 2 weeks before or after immunizations). Barium: Avoid having a procedure within 7-10 days after having had a radiological study involving the use of radiological contrast. (Myelograms, Barium swallow or enema study). Heart attacks: Avoid any elective procedures or surgeries for the initial 6 months after a "Myocardial Infarction" (Heart Attack). Blood thinners: It is imperative that you stop these medications before procedures. Let us know if you if you take any blood thinner.  Infection: Avoid procedures during or within two weeks of an infection (including chest colds or gastrointestinal problems). Symptoms associated with infections include: Localized redness, fever, chills, night sweats or profuse sweating, burning sensation when voiding, cough, congestion,   stuffiness, runny nose, sore throat, diarrhea,  nausea, vomiting, cold or Flu symptoms, recent or current infections. It is especially important if the infection is over the area that we intend to treat. Heart and lung problems: Symptoms that may suggest an active cardiopulmonary problem include: cough, chest pain, breathing difficulties or shortness of breath, dizziness, ankle swelling, uncontrolled high or unusually low blood pressure, and/or palpitations. If you are experiencing any of these symptoms, cancel your procedure and contact your primary care physician for an evaluation.  Remember:  Regular Business hours are:  Monday to Thursday 8:00 AM to 4:00 PM  Provider's Schedule: Trei Schoch, MD:  Procedure days: Tuesday and Thursday 7:30 AM to 4:00 PM  Bilal Lateef, MD:  Procedure days: Monday and Wednesday 7:30 AM to 4:00 PM ______________________________________________________________________  ____________________________________________________________________________________________  General Risks and Possible Complications  Patient Responsibilities: It is important that you read this as it is part of your informed consent. It is our duty to inform you of the risks and possible complications associated with treatments offered to you. It is your responsibility as a patient to read this and to ask questions about anything that is not clear or that you believe was not covered in this document.  Patient's Rights: You have the right to refuse treatment. You also have the right to change your mind, even after initially having agreed to have the treatment done. However, under this last option, if you wait until the last second to change your mind, you may be charged for the materials used up to that point.  Introduction: Medicine is not an exact science. Everything in Medicine, including the lack of treatment(s), carries the potential for danger, harm, or loss (which is by definition: Risk). In Medicine, a complication is a secondary  problem, condition, or disease that can aggravate an already existing one. All treatments carry the risk of possible complications. The fact that a side effects or complications occurs, does not imply that the treatment was conducted incorrectly. It must be clearly understood that these can happen even when everything is done following the highest safety standards.  No treatment: You can choose not to proceed with the proposed treatment alternative. The "PRO(s)" would include: avoiding the risk of complications associated with the therapy. The "CON(s)" would include: not getting any of the treatment benefits. These benefits fall under one of three categories: diagnostic; therapeutic; and/or palliative. Diagnostic benefits include: getting information which can ultimately lead to improvement of the disease or symptom(s). Therapeutic benefits are those associated with the successful treatment of the disease. Finally, palliative benefits are those related to the decrease of the primary symptoms, without necessarily curing the condition (example: decreasing the pain from a flare-up of a chronic condition, such as incurable terminal cancer).  General Risks and Complications: These are associated to most interventional treatments. They can occur alone, or in combination. They fall under one of the following six (6) categories: no benefit or worsening of symptoms; bleeding; infection; nerve damage; allergic reactions; and/or death. No benefits or worsening of symptoms: In Medicine there are no guarantees, only probabilities. No healthcare provider can ever guarantee that a medical treatment will work, they can only state the probability that it may. Furthermore, there is always the possibility that the condition may worsen, either directly, or indirectly, as a consequence of the treatment. Bleeding: This is more common if the patient is taking a blood thinner, either prescription or over the counter (example: Goody  Powders, Fish oil, Aspirin, Garlic, etc.),   or if suffering a condition associated with impaired coagulation (example: Hemophilia, cirrhosis of the liver, low platelet counts, etc.). However, even if you do not have one on these, it can still happen. If you have any of these conditions, or take one of these drugs, make sure to notify your treating physician. Infection: This is more common in patients with a compromised immune system, either due to disease (example: diabetes, cancer, human immunodeficiency virus [HIV], etc.), or due to medications or treatments (example: therapies used to treat cancer and rheumatological diseases). However, even if you do not have one on these, it can still happen. If you have any of these conditions, or take one of these drugs, make sure to notify your treating physician. Nerve Damage: This is more common when the treatment is an invasive one, but it can also happen with the use of medications, such as those used in the treatment of cancer. The damage can occur to small secondary nerves, or to large primary ones, such as those in the spinal cord and brain. This damage may be temporary or permanent and it may lead to impairments that can range from temporary numbness to permanent paralysis and/or brain death. Allergic Reactions: Any time a substance or material comes in contact with our body, there is the possibility of an allergic reaction. These can range from a mild skin rash (contact dermatitis) to a severe systemic reaction (anaphylactic reaction), which can result in death. Death: In general, any medical intervention can result in death, most of the time due to an unforeseen complication. ____________________________________________________________________________________________  

## 2021-02-03 NOTE — Progress Notes (Signed)
Safety precautions to be maintained throughout the outpatient stay will include: orient to surroundings, keep bed in low position, maintain call bell within reach at all times, provide assistance with transfer out of bed and ambulation.  

## 2021-02-10 ENCOUNTER — Ambulatory Visit: Payer: BC Managed Care – PPO | Admitting: Dermatology

## 2021-02-10 ENCOUNTER — Other Ambulatory Visit: Payer: Self-pay

## 2021-02-10 DIAGNOSIS — B352 Tinea manuum: Secondary | ICD-10-CM

## 2021-02-10 DIAGNOSIS — L4050 Arthropathic psoriasis, unspecified: Secondary | ICD-10-CM | POA: Diagnosis not present

## 2021-02-10 DIAGNOSIS — L409 Psoriasis, unspecified: Secondary | ICD-10-CM | POA: Diagnosis not present

## 2021-02-10 MED ORDER — IXEKIZUMAB 80 MG/ML ~~LOC~~ SOAJ
160.0000 mg | Freq: Once | SUBCUTANEOUS | Status: AC
  Administered 2021-02-10: 160 mg via SUBCUTANEOUS

## 2021-02-10 NOTE — Progress Notes (Signed)
PROVIDER NOTE: Information contained herein reflects review and annotations entered in association with encounter. Interpretation of such information and data should be left to medically-trained personnel. Information provided to patient can be located elsewhere in the medical record under "Patient Instructions". Document created using STT-dictation technology, any transcriptional errors that may result from process are unintentional.    Patient: Peter [name]  Service Category: Procedure  Provider: Oswaldo Done, MD  DOB: [birth year]  DOS: 02/13/2021  Location: [facility]  MRN: [id number]  Setting: Ambulatory - outpatient  Referring Provider: Delano Metz, MD  Type: Established Patient  Specialty: Interventional Pain Management  PCP: Jamelle Haring, MD   Primary Reason for Visit: Interventional Pain Management Treatment. CC: Back Pain (lower)  Procedure:          Anesthesia, Analgesia, Anxiolysis:  Type: Lumbar Facet, Medial Branch Block(s)          Primary Purpose: Diagnostic Region: Posterolateral Lumbosacral Spine Level: L2, L3, L4, L5, & S1 Medial Branch Level(s). Injecting these levels blocks the L3-4, L4-5, and L5-S1 lumbar facet joints. Laterality: Bilateral  Type: Minimal (Conscious) Anxiolysis combined with Local Anesthesia Indication(s): Analgesia and Anxiety Route: Intravenous (IV) IV Access: Secured Sedation: Meaningful verbal contact was maintained at all times during the procedure  Local Anesthetic: Lidocaine 1-2%  Position: Prone   Indications: 1. Lumbosacral facet joint syndrome   2. Lumbar facet hypertrophy   3. Spondylosis without myelopathy or radiculopathy, lumbosacral region   4. Degenerative disc disease, lumbar   5. Chronic low back pain (1ry area of Pain) (Bilateral) (R>L) w/o sciatica   6. Anterolisthesis of lumbosacral spine (L5/S1)    Pain Score: Pre-procedure: 9 /10 Post-procedure: 0-No pain/10   Pre-op H&P  Assessment:  Peter [name] is a 55 y.o. (year old), male patient, seen today for interventional treatment. He  has a past surgical history that includes Cholecystectomy (1989); Tonsillectomy (1970); Appendectomy (1988); Colonoscopy with propofol (N/A, 08/16/2015); polypectomy (08/16/2015); Laparoscopic partial colectomy (N/A, 09/11/2015); Cystoscopy with stent placement (Bilateral, 09/11/2015); Bladder repair (N/A, 09/11/2015); Ileostomy closure (N/A, 11/19/2015); Colon surgery; and Anterior cervical decompression/discectomy fusion 4 level (N/A, 02/15/2019). [name] has a current medication list which includes the following prescription(s): ciclopirox, diazepam, fluticasone-salmeterol, hydroxychloroquine, taltz, taltz, taltz, omeprazole, ondansetron, and polyethylene glycol-electrolytes, and the following Facility-Administered Medications: midazolam and triamcinolone acetonide. His primarily concern today is the Back Pain (lower)  Initial Vital Signs:  Pulse/HCG Rate: 82ECG Heart Rate: 74 Temp: (!) 96.8 F (36 C) Resp: 16 BP: 112/79 SpO2: 97 %  BMI: Estimated body mass index is 31.01 kg/m as calculated from the following:   Height as of this encounter: 5\' 9"  (1.753 m).   Weight as of this encounter: 210 lb (95.3 kg).  Risk Assessment: Allergies: Reviewed. He has No Known Allergies.  Allergy Precautions: None required Coagulopathies: Reviewed. None identified.  Blood-thinner therapy: None at this time Active Infection(s): Reviewed. None identified. Peter [name] is afebrile  Site Confirmation: Mr. Radu was asked to confirm the procedure and laterality before marking the site Procedure checklist: Completed Consent: Before the procedure and under the influence of no sedative(s), amnesic(s), or anxiolytics, the patient was informed of the treatment options, risks and possible complications. To fulfill our ethical and legal obligations, as recommended by the American Medical Association's Code of  Ethics, I have informed the patient of my clinical impression; the nature and purpose of the treatment or procedure; the risks, benefits, and possible complications of the intervention; the alternatives, including doing nothing;  the risk(s) and benefit(s) of the alternative treatment(s) or procedure(s); and the risk(s) and benefit(s) of doing nothing. The patient was provided information about the general risks and possible complications associated with the procedure. These may include, but are not limited to: failure to achieve desired goals, infection, bleeding, organ or nerve damage, allergic reactions, paralysis, and death. In addition, the patient was informed of those risks and complications associated to Spine-related procedures, such as failure to decrease pain; infection (i.e.: Meningitis, epidural or intraspinal abscess); bleeding (i.e.: epidural hematoma, subarachnoid hemorrhage, or any other type of intraspinal or peri-dural bleeding); organ or nerve damage (i.e.: Any type of peripheral nerve, nerve root, or spinal cord injury) with subsequent damage to sensory, motor, and/or autonomic systems, resulting in permanent pain, numbness, and/or weakness of one or several areas of the body; allergic reactions; (i.e.: anaphylactic reaction); and/or death. Furthermore, the patient was informed of those risks and complications associated with the medications. These include, but are not limited to: allergic reactions (i.e.: anaphylactic or anaphylactoid reaction(s)); adrenal axis suppression; blood sugar elevation that in diabetics may result in ketoacidosis or comma; water retention that in patients with history of congestive heart failure may result in shortness of breath, pulmonary edema, and decompensation with resultant heart failure; weight gain; swelling or edema; medication-induced neural toxicity; particulate matter embolism and blood vessel occlusion with resultant organ, and/or nervous system  infarction; and/or aseptic necrosis of one or more joints. Finally, the patient was informed that Medicine is not an exact science; therefore, there is also the possibility of unforeseen or unpredictable risks and/or possible complications that may result in a catastrophic outcome. The patient indicated having understood very clearly. We have given the patient no guarantees and we have made no promises. Enough time was given to the patient to ask questions, all of which were answered to the patient's satisfaction. Mr. Hasler has indicated that he wanted to continue with the procedure. Attestation: I, the ordering provider, attest that I have discussed with the patient the benefits, risks, side-effects, alternatives, likelihood of achieving goals, and potential problems during recovery for the procedure that I have provided informed consent. Date  Time: 02/13/2021  8:11 AM  Pre-Procedure Preparation:  Monitoring: As per clinic protocol. Respiration, ETCO2, SpO2, BP, heart rate and rhythm monitor placed and checked for adequate function Safety Precautions: Patient was assessed for positional comfort and pressure points before starting the procedure. Time-out: I initiated and conducted the "Time-out" before starting the procedure, as per protocol. The patient was asked to participate by confirming the accuracy of the "Time Out" information. Verification of the correct person, site, and procedure were performed and confirmed by me, the nursing staff, and the patient. "Time-out" conducted as per Joint Commission's Universal Protocol (UP.01.01.01). Time: 0956  Description of Procedure:          Laterality: Bilateral. The procedure was performed in identical fashion on both sides. Levels:  L2, L3, L4, L5, & S1 Medial Branch Level(s) Area Prepped: Posterior Lumbosacral Region DuraPrep (Iodine Povacrylex [0.7% available iodine] and Isopropyl Alcohol, 74% w/w) Safety Precautions: Aspiration looking for blood  return was conducted prior to all injections. At no point did we inject any substances, as a needle was being advanced. Before injecting, the patient was told to immediately notify me if he was experiencing any new onset of "ringing in the ears, or metallic taste in the mouth". No attempts were made at seeking any paresthesias. Safe injection practices and needle disposal techniques used. Medications properly checked  for expiration dates. SDV (single dose vial) medications used. After the completion of the procedure, all disposable equipment used was discarded in the proper designated medical waste containers. Local Anesthesia: Protocol guidelines were followed. The patient was positioned over the fluoroscopy table. The area was prepped in the usual manner. The time-out was completed. The target area was identified using fluoroscopy. A 12-in long, straight, sterile hemostat was used with fluoroscopic guidance to locate the targets for each level blocked. Once located, the skin was marked with an approved surgical skin marker. Once all sites were marked, the skin (epidermis, dermis, and hypodermis), as well as deeper tissues (fat, connective tissue and muscle) were infiltrated with a small amount of a short-acting local anesthetic, loaded on a 10cc syringe with a 25G, 1.5-in  Needle. An appropriate amount of time was allowed for local anesthetics to take effect before proceeding to the next step. Local Anesthetic: Lidocaine 2.0% The unused portion of the local anesthetic was discarded in the proper designated containers. Technical explanation of process:  L2 Medial Branch Nerve Block (MBB): The target area for the L2 medial branch is at the junction of the postero-lateral aspect of the superior articular process and the superior, posterior, and medial edge of the transverse process of L3. Under fluoroscopic guidance, a Quincke needle was inserted until contact was made with os over the superior postero-lateral  aspect of the pedicular shadow (target area). After negative aspiration for blood, 0.5 mL of the nerve block solution was injected without difficulty or complication. The needle was removed intact. L3 Medial Branch Nerve Block (MBB): The target area for the L3 medial branch is at the junction of the postero-lateral aspect of the superior articular process and the superior, posterior, and medial edge of the transverse process of L4. Under fluoroscopic guidance, a Quincke needle was inserted until contact was made with os over the superior postero-lateral aspect of the pedicular shadow (target area). After negative aspiration for blood, 0.5 mL of the nerve block solution was injected without difficulty or complication. The needle was removed intact. L4 Medial Branch Nerve Block (MBB): The target area for the L4 medial branch is at the junction of the postero-lateral aspect of the superior articular process and the superior, posterior, and medial edge of the transverse process of L5. Under fluoroscopic guidance, a Quincke needle was inserted until contact was made with os over the superior postero-lateral aspect of the pedicular shadow (target area). After negative aspiration for blood, 0.5 mL of the nerve block solution was injected without difficulty or complication. The needle was removed intact. L5 Medial Branch Nerve Block (MBB): The target area for the L5 medial branch is at the junction of the postero-lateral aspect of the superior articular process and the superior, posterior, and medial edge of the sacral ala. Under fluoroscopic guidance, a Quincke needle was inserted until contact was made with os over the superior postero-lateral aspect of the pedicular shadow (target area). After negative aspiration for blood, 0.5 mL of the nerve block solution was injected without difficulty or complication. The needle was removed intact. S1 Medial Branch Nerve Block (MBB): The target area for the S1 medial branch is  at the posterior and inferior 6 o'clock position of the L5-S1 facet joint. Under fluoroscopic guidance, the Quincke needle inserted for the L5 MBB was redirected until contact was made with os over the inferior and postero aspect of the sacrum, at the 6 o' clock position under the L5-S1 facet joint (Target area). After negative  aspiration for blood, 0.5 mL of the nerve block solution was injected without difficulty or complication. The needle was removed intact.  Nerve block solution: 0.2% PF-Ropivacaine + Triamcinolone (40 mg/mL) diluted to a final concentration of 4 mg of Triamcinolone/mL of Ropivacaine The unused portion of the solution was discarded in the proper designated containers. Procedural Needles: 22-gauge, 3.5-inch, Quincke needles used for all levels.  Once the entire procedure was completed, the treated area was cleaned, making sure to leave some of the prepping solution back to take advantage of its long term bactericidal properties.      Illustration of the posterior view of the lumbar spine and the posterior neural structures. Laminae of L2 through S1 are labeled. DPRL5, dorsal primary ramus of L5; DPRS1, dorsal primary ramus of S1; DPR3, dorsal primary ramus of L3; FJ, facet (zygapophyseal) joint L3-L4; I, inferior articular process of L4; LB1, lateral branch of dorsal primary ramus of L1; IAB, inferior articular branches from L3 medial branch (supplies L4-L5 facet joint); IBP, intermediate branch plexus; MB3, medial branch of dorsal primary ramus of L3; NR3, third lumbar nerve root; S, superior articular process of L5; SAB, superior articular branches from L4 (supplies L4-5 facet joint also); TP3, transverse process of L3.  Vitals:   02/13/21 1009 02/13/21 1019 02/13/21 1030 02/13/21 1038  BP: (!) 131/94 138/90 135/85 (!) 126/96  Pulse:      Resp: Temp:      TempSrc:      SpO2: 98% 98% 98% 98%  Weight:      Height:         Start Time: 0956 hrs. End Time:  1007 hrs.  Imaging Guidance (Spinal):          Type of Imaging Technique: Fluoroscopy Guidance (Spinal) Indication(s): Assistance in needle guidance and placement for procedures requiring needle placement in or near specific anatomical locations not easily accessible without such assistance. Exposure Time: Please see nurses notes. Contrast: None used. Fluoroscopic Guidance: I was personally present during the use of fluoroscopy. "Tunnel Vision Technique" used to obtain the best possible view of the target area. Parallax error corrected before commencing the procedure. "Direction-depth-direction" technique used to introduce the needle under continuous pulsed fluoroscopy. Once target was reached, antero-posterior, oblique, and lateral fluoroscopic projection used confirm needle placement in all planes. Images permanently stored in EMR. Interpretation: No contrast injected. I personally interpreted the imaging intraoperatively. Adequate needle placement confirmed in multiple planes. Permanent images saved into the patient's record.  Antibiotic Prophylaxis:   Anti-infectives (From admission, onward)    None      Indication(s): None identified  Post-operative Assessment:  Post-procedure Vital Signs:  Pulse/HCG Rate: 8275 Temp: (!) 96.8 F (36 C) Resp: 18 BP: (!) 126/96 SpO2: 98 %  EBL: None  Complications: No immediate post-treatment complications observed by team, or reported by patient.  Note: The patient tolerated the entire procedure well. A repeat set of vitals were taken after the procedure and the patient was kept under observation following institutional policy, for this type of procedure. Post-procedural neurological assessment was performed, showing return to baseline, prior to discharge. The patient was provided with post-procedure discharge instructions, including a section on how to identify potential problems. Should any problems arise concerning this procedure, the patient  was given instructions to immediately contact us, at any time, without hesitation. In any case, we plan to contact the patient by telephone for a follow-up status report regarding this interventional procedure.  Comments:  No additional relevant information.  Plan of Care  Orders:  Orders Placed This Encounter  Procedures   LUMBAR FACET(MEDIAL BRANCH NERVE BLOCK) MBNB    Scheduling Instructions:     Procedure: Lumbar facet block (AKA.: Lumbosacral medial branch nerve block)     Side: Bilateral     Level: L3-4, L4-5, & L5-S1 Facets (L2, L3, L4, L5, & S1 Medial Branch Nerves)     Sedation: Patient's choice.     Timeframe: Today    Order Specific Question:   Where will this procedure be performed?    Answer:   [facility]   DG [facility] C-ARM 1-60 MIN NO REPORT    Intraoperative interpretation by procedural physician at [facility].    Standing Status:   Standing    Number of Occurrences:   1    Order Specific Question:   Reason for exam:    Answer:   Assistance in needle guidance and placement for procedures requiring needle placement in or near specific anatomical locations not easily accessible without such assistance.   Informed Consent Details: Physician/Practitioner Attestation; Transcribe to consent form and obtain patient signature    Nursing Order: Transcribe to consent form and obtain patient signature. Note: Always confirm laterality of pain with Mr. Hanner, before procedure.    Order Specific Question:   Physician/Practitioner attestation of informed consent for procedure/surgical case    Answer:   I, the physician/practitioner, attest that I have discussed with the patient the benefits, risks, side effects, alternatives, likelihood of achieving goals and potential problems during recovery for the procedure that I have provided informed consent.    Order Specific Question:   Procedure    Answer:   Lumbar Facet Block  under fluoroscopic guidance    Order  Specific Question:   Physician/Practitioner performing the procedure    Answer:   Estela Vinal A. Laban Emperor MD    Order Specific Question:   Indication/Reason    Answer:   Low Back Pain, with our without leg pain, due to Facet Joint Arthralgia (Joint Pain) Spondylosis (Arthritis of the Spine), without myelopathy or radiculopathy (Nerve Damage).   Provide equipment / supplies at bedside    "Block Tray" (Disposable  single use) Needle type: SpinalSpinal Amount/quantity: 4 Size: Regular (3.5-inch) Gauge: 22G    Standing Status:   Standing    Number of Occurrences:   1    Order Specific Question:   Specify    Answer:   Block Tray    Chronic Opioid Analgesic:  Hydrocodone/APAP 5/325 1 tab p.o. twice daily (10/01/2020). NO OPIOIDS - Abnormal UDS (11/04/2020): Positive for marijuana MME/day: 10 mg/day   Medications ordered for procedure: Meds ordered this encounter  Medications   lidocaine (XYLOCAINE) 2 % (with pres) injection 400 mg   lactated ringers infusion 1,000 mL   midazolam (VERSED) 5 MG/5ML injection 1-2 mg    Make sure Flumazenil is available in the pyxis when using this medication. If oversedation occurs, administer 0.2 mg IV over 15 sec. If after 45 sec no response, administer 0.2 mg again over 1 min; may repeat at 1 min intervals; not to exceed 4 doses (1 mg)   ropivacaine (PF) 2 mg/mL (0.2%) (NAROPIN) injection 18 mL   triamcinolone acetonide (KENALOG-40) injection 80 mg   triamcinolone acetonide (KENALOG-40) 40 MG/ML injection    Shatley, Jennifer   : cabinet override    Medications administered: We administered lidocaine, lactated ringers, midazolam, ropivacaine (PF) 2 mg/mL (0.2%), and triamcinolone acetonide.  See the medical record for exact dosing, route, and time of administration.  Follow-up plan:   Return in about 2 weeks (around 02/27/2021) for (T,Th) (VV) (PPE).      Interventional Therapies  Risk  Complexity Considerations:   Estimated body mass index is 31.45  kg/m as calculated from the following:   Height as of this encounter: 5\' 9"  (1.753 m).   Weight as of this encounter: 213 lb (96.6 kg). NO OPIOIDS!!! - Abnormal UDS (11/04/2020): Positive for marijuana   Planned  Pending:   Pending further evaluation   Under consideration:   Diagnostic bilateral lumbar facet block #1    Completed:   None at this time   Therapeutic  Palliative (PRN) options:   None established     Recent Visits Date Type Provider Dept  02/03/21 Office Visit Delano MetzNaveira, Dawsyn Zurn, MD [facility]  Showing recent visits within past 90 days and meeting all other requirements Today's Visits Date Type Provider Dept  02/13/21 Procedure visit Delano MetzNaveira, Amanuel Sinkfield, MD [facility]  Showing today's visits and meeting all other requirements Future Appointments Date Type Provider Dept  03/20/21 Appointment Delano MetzNaveira, Rhyland Hinderliter, MD [facility]  Showing future appointments within next 90 days and meeting all other requirements Disposition: Discharge home  Discharge (Date  Time): 02/13/2021; 1045 hrs.   Primary Care Physician: Jamelle HaringHendrickson, Clifford D, MD [facility] Note by: Oswaldo DoneFrancisco A Zalika Tieszen, MD Date: 02/13/2021; Time: 12:29 PM  Disclaimer:  Medicine is not an Visual merchandiserexact science. The only guarantee in medicine is that nothing is guaranteed. It is important to note that the decision to proceed with this intervention was based on the information collected from the patient. The Data and conclusions were drawn from the patient's questionnaire, the interview, and the physical examination. Because the information was provided in large part by the patient, it cannot be guaranteed that it has not been purposely or unconsciously manipulated. Every effort has been made to obtain as much relevant data as possible for this evaluation. It is important to note that the conclusions that lead to this procedure are derived in large part  from the available data. Always take into account that the treatment will also be dependent on availability of resources and existing treatment guidelines, considered by other Pain Management Practitioners as being common knowledge and practice, at the time of the intervention. For Medico-Legal purposes, it is also important to point out that variation in procedural techniques and pharmacological choices are the acceptable norm. The indications, contraindications, technique, and results of the above procedure should only be interpreted and judged by a Board-Certified Interventional Pain Specialist with extensive familiarity and expertise in the same exact procedure and technique.

## 2021-02-10 NOTE — Patient Instructions (Signed)

## 2021-02-12 NOTE — Progress Notes (Signed)
   Follow-Up Visit   Subjective  Peter [name] is a 55 y.o. male who presents for the following: Psoriasis (1 month f/u Psoriasis ). Pt also with Psoriatic arthritis and Tinea manus (on Terbinifine). Pt to start Taltz today.  The following portions of the chart were reviewed this encounter and updated as appropriate:      Review of Systems:  No other skin or systemic complaints except as noted in HPI or Assessment and Plan.  Objective  Well appearing patient in no apparent distress; mood and affect are within normal limits.  A focused examination was performed including arms, hands. Relevant physical exam findings are noted in the Assessment and Plan.   Assessment & Plan  Psoriasis Arms and Legs Psoriasis with Psoriatic Arthritis  Psoriasis is a chronic non-curable, but treatable genetic/hereditary disease that may have other systemic features affecting other organ systems such as joints (Psoriatic Arthritis). It is associated with an increased risk of inflammatory bowel disease, heart disease, non-alcoholic fatty liver disease, and depression.    Taltz 80 mg injected to right upper arm and left upper arm today. Patient tolerated well. Lot # [id number] CE Exp 11/2021, Lot #[id number] AC Exp 08/28/2022  Ixekizumab SOAJ 160 mg - Arms and Legs  Psoriasis is a chronic non-curable, but treatable genetic/hereditary disease that may have other systemic features affecting other organ systems such as joints (Psoriatic Arthritis). It is associated with an increased risk of inflammatory bowel disease, heart disease, non-alcoholic fatty liver disease, and depression.    Tinea manus Hands Improved with terbinafine  Return in about 2 weeks (around 02/24/2021) for Psoriasis with Dr Roseanne Reno.  I, Joanie Coddington, CMA, am acting as scribe for Armida Sans, MD .  Documentation: I have reviewed the above documentation for accuracy and completeness, and I agree with the above.  Armida Sans, MD

## 2021-02-13 ENCOUNTER — Ambulatory Visit (HOSPITAL_BASED_OUTPATIENT_CLINIC_OR_DEPARTMENT_OTHER): Payer: BC Managed Care – PPO | Admitting: Pain Medicine

## 2021-02-13 ENCOUNTER — Other Ambulatory Visit: Payer: Self-pay

## 2021-02-13 ENCOUNTER — Encounter: Payer: Self-pay | Admitting: Pain Medicine

## 2021-02-13 ENCOUNTER — Ambulatory Visit
Admission: RE | Admit: 2021-02-13 | Discharge: 2021-02-13 | Disposition: A | Discharge status: Home / Self Care | Payer: BC Managed Care – PPO | Source: Ambulatory Visit | Attending: Pain Medicine | Admitting: Pain Medicine

## 2021-02-13 VITALS — BP 126/96 | HR 82 | Temp 96.8°F | Resp 18 | Ht 69.0 in | Wt 210.0 lb

## 2021-02-13 DIAGNOSIS — M5136 Other intervertebral disc degeneration, lumbar region: Secondary | ICD-10-CM

## 2021-02-13 DIAGNOSIS — M4317 Spondylolisthesis, lumbosacral region: Secondary | ICD-10-CM | POA: Insufficient documentation

## 2021-02-13 DIAGNOSIS — M47817 Spondylosis without myelopathy or radiculopathy, lumbosacral region: Secondary | ICD-10-CM

## 2021-02-13 DIAGNOSIS — G8929 Other chronic pain: Secondary | ICD-10-CM

## 2021-02-13 DIAGNOSIS — M47816 Spondylosis without myelopathy or radiculopathy, lumbar region: Secondary | ICD-10-CM | POA: Insufficient documentation

## 2021-02-13 DIAGNOSIS — M545 Low back pain, unspecified: Secondary | ICD-10-CM

## 2021-02-13 MED ORDER — LIDOCAINE HCL 2 % IJ SOLN
20.0000 mL | Freq: Once | INTRAMUSCULAR | Status: AC
  Administered 2021-02-13: 100 mg

## 2021-02-13 MED ORDER — TRIAMCINOLONE ACETONIDE 40 MG/ML IJ SUSP
INTRAMUSCULAR | Status: AC
  Filled 2021-02-13: qty 1

## 2021-02-13 MED ORDER — MIDAZOLAM HCL 5 MG/5ML IJ SOLN
1.0000 mg | INTRAMUSCULAR | Status: DC | PRN
  Administered 2021-02-13: 3 mg via INTRAVENOUS
  Filled 2021-02-13: qty 5

## 2021-02-13 MED ORDER — TRIAMCINOLONE ACETONIDE 40 MG/ML IJ SUSP
80.0000 mg | Freq: Once | INTRAMUSCULAR | Status: AC
  Administered 2021-02-13: 80 mg
  Filled 2021-02-13: qty 2

## 2021-02-13 MED ORDER — ROPIVACAINE HCL 2 MG/ML IJ SOLN
18.0000 mL | Freq: Once | INTRAMUSCULAR | Status: AC
Start: 2021-02-13 — End: 2021-02-13
  Administered 2021-02-13: 18 mL via PERINEURAL

## 2021-02-13 MED ORDER — LACTATED RINGERS IV SOLN
1000.0000 mL | Freq: Once | INTRAVENOUS | Status: AC
  Administered 2021-02-13: 1000 mL via INTRAVENOUS

## 2021-02-13 NOTE — Patient Instructions (Addendum)
____________________________________________________________________________________________  Post-Procedure Discharge Instructions  Instructions: Apply ice:  Purpose: This will minimize any swelling and discomfort after procedure.  When: Day of procedure, as soon as you get home. How: Fill a plastic sandwich bag with crushed ice. Cover it with a small towel and apply to injection site. How long: (15 min on, 15 min off) Apply for 15 minutes then remove x 15 minutes.  Repeat sequence on day of procedure, until you go to bed. Apply heat:  Purpose: To treat any soreness and discomfort from the procedure. When: Starting the next day after the procedure. How: Apply heat to procedure site starting the day following the procedure. How long: May continue to repeat daily, until discomfort goes away. Food intake: Start with clear liquids (like water) and advance to regular food, as tolerated.  Physical activities: Keep activities to a minimum for the first 8 hours after the procedure. After that, then as tolerated. Driving: If you have received any sedation, be responsible and do not drive. You are not allowed to drive for 24 hours after having sedation. Blood thinner: (Applies only to those taking blood thinners) You may restart your blood thinner 6 hours after your procedure. Insulin: (Applies only to Diabetic patients taking insulin) As soon as you can eat, you may resume your normal dosing schedule. Infection prevention: Keep procedure site clean and dry. Shower daily and clean area with soap and water. Post-procedure Pain Diary: Extremely important that this be done correctly and accurately. Recorded information will be used to determine the next step in treatment. For the purpose of accuracy, follow these rules: Evaluate only the area treated. Do not report or include pain from an untreated area. For the purpose of this evaluation, ignore all other areas of pain, except for the treated area. After  your procedure, avoid taking a long nap and attempting to complete the pain diary after you wake up. Instead, set your alarm clock to go off every hour, on the hour, for the initial 8 hours after the procedure. Document the duration of the numbing medicine, and the relief you are getting from it. Do not go to sleep and attempt to complete it later. It will not be accurate. If you received sedation, it is likely that you were given a medication that may cause amnesia. Because of this, completing the diary at a later time may cause the information to be inaccurate. This information is needed to plan your care. Follow-up appointment: Keep your post-procedure follow-up evaluation appointment after the procedure (usually 2 weeks for most procedures, 6 weeks for radiofrequencies). DO NOT FORGET to bring you pain diary with you.   Expect: (What should I expect to see with my procedure?) From numbing medicine (AKA: Local Anesthetics): Numbness or decrease in pain. You may also experience some weakness, which if present, could last for the duration of the local anesthetic. Onset: Full effect within 15 minutes of injected. Duration: It will depend on the type of local anesthetic used. On the average, 1 to 8 hours.  From steroids (Applies only if steroids were used): Decrease in swelling or inflammation. Once inflammation is improved, relief of the pain will follow. Onset of benefits: Depends on the amount of swelling present. The more swelling, the longer it will take for the benefits to be seen. In some cases, up to 10 days. Duration: Steroids will stay in the system x 2 weeks. Duration of benefits will depend on multiple posibilities including persistent irritating factors. Side-effects: If present, they   may typically last 2 weeks (the duration of the steroids). Frequent: Cramps (if they occur, drink Gatorade and take over-the-counter Magnesium 450-500 mg once to twice a day); water retention with temporary  weight gain; increases in blood sugar; decreased immune system response; increased appetite. Occasional: Facial flushing (red, warm cheeks); mood swings; menstrual changes. Uncommon: Long-term decrease or suppression of natural hormones; bone thinning. (These are more common with higher doses or more frequent use. This is why we prefer that our patients avoid having any injection therapies in other practices.)  Very Rare: Severe mood changes; psychosis; aseptic necrosis. From procedure: Some discomfort is to be expected once the numbing medicine wears off. This should be minimal if ice and heat are applied as instructed.  Call if: (When should I call?) You experience numbness and weakness that gets worse with time, as opposed to wearing off. New onset bowel or bladder incontinence. (Applies only to procedures done in the spine)  Emergency Numbers: Durning business hours (Monday - Thursday, 8:00 AM - 4:00 PM) (Friday, 9:00 AM - 12:00 Noon): [phone] After hours: [phone] NOTE: If you are having a problem and are unable connect with, or to talk to a provider, then go to your nearest urgent care or emergency department. If the problem is serious and urgent, please call 911. ____________________________________________________________________________________________  Facet Blocks Patient Information  Description: The facets are joints in the spine between the vertebrae.  Like any joints in the body, facets can become irritated and painful.  Arthritis can also effect the facets.  By injecting steroids and local anesthetic in and around these joints, we can temporarily block the nerve supply to them.  Steroids act directly on irritated nerves and tissues to reduce selling and inflammation which often leads to decreased pain.  Facet blocks may be done anywhere along the spine from the neck to the low back depending upon the location of your pain.   After numbing the skin with local anesthetic  (like Novocaine), a small needle is passed onto the facet joints under x-ray guidance.  You may experience a sensation of pressure while this is being done.  The entire block usually lasts about 15-25 minutes.   Conditions which may be treated by facet blocks:  Low back/buttock pain Neck/shoulder pain Certain types of headaches  Preparation for the injection:  Do not eat any solid food or dairy products within 8 hours of your appointment. You may drink clear liquid up to 3 hours before appointment.  Clear liquids include water, black coffee, juice or soda.  No milk or cream please. You may take your regular medication, including pain medications, with a sip of water before your appointment.  Diabetics should hold regular insulin (if taken separately) and take 1/2 normal NPH dose the morning of the procedure.  Carry some sugar containing items with you to your appointment. A driver must accompany you and be prepared to drive you home after your procedure. Bring all your current medications with you. An IV may be inserted and sedation may be given at the discretion of the physician. A blood pressure cuff, EKG and other monitors will often be applied during the procedure.  Some patients may need to have extra oxygen administered for a short period. You will be asked to provide medical information, including your allergies and medications, prior to the procedure.  We must know immediately if you are taking blood thinners (like Coumadin/Warfarin) or if you are allergic to IV iodine contrast (dye).    We must know if you could possible be pregnant.  Possible side-effects:  Bleeding from needle site Infection (rare, may require surgery) Nerve injury (rare) Numbness & tingling (temporary) Difficulty urinating (rare, temporary) Spinal headache (a headache worse with upright posture) Light-headedness (temporary) Pain at injection site (serveral days) Decreased blood pressure (rare,  temporary) Weakness in arm/leg (temporary) Pressure sensation in back/neck (temporary)   Call if you experience:  Fever/chills associated with headache or increased back/neck pain Headache worsened by an upright position New onset, weakness or numbness of an extremity below the injection site Hives or difficulty breathing (go to the emergency room) Inflammation or drainage at the injection site(s) Severe back/neck pain greater than usual New symptoms which are concerning to you  Please note:  Although the local anesthetic injected can often make your back or neck feel good for several hours after the injection, the pain will likely return. It takes 3-7 days for steroids to work.  You may not notice any pain relief for at least one week.  If effective, we will often do a series of 2-3 injections spaced 3-6 weeks apart to maximally decrease your pain.  After the initial series, you may be a candidate for a more permanent nerve block of the facets.  If you have any questions, please call [phone] [facility] 

## 2021-02-13 NOTE — Progress Notes (Signed)
Safety precautions to be maintained throughout the outpatient stay will include: orient to surroundings, keep bed in low position, maintain call bell within reach at all times, provide assistance with transfer out of bed and ambulation.  

## 2021-02-14 ENCOUNTER — Ambulatory Visit: Payer: BC Managed Care – PPO | Admitting: Anesthesiology

## 2021-02-14 ENCOUNTER — Ambulatory Visit
Admission: RE | Admit: 2021-02-14 | Discharge: 2021-02-14 | Disposition: A | Discharge status: Home / Self Care | Payer: BC Managed Care – PPO | Attending: Gastroenterology | Admitting: Gastroenterology

## 2021-02-14 ENCOUNTER — Telehealth: Payer: Self-pay

## 2021-02-14 ENCOUNTER — Encounter
Admission: RE | Disposition: A | Discharge status: Home / Self Care | Payer: Self-pay | Source: Home / Self Care | Attending: Gastroenterology

## 2021-02-14 DIAGNOSIS — Z860100 Personal history of colon polyps, unspecified: Secondary | ICD-10-CM

## 2021-02-14 DIAGNOSIS — Z1211 Encounter for screening for malignant neoplasm of colon: Secondary | ICD-10-CM | POA: Diagnosis not present

## 2021-02-14 DIAGNOSIS — F17210 Nicotine dependence, cigarettes, uncomplicated: Secondary | ICD-10-CM | POA: Diagnosis not present

## 2021-02-14 DIAGNOSIS — J449 Chronic obstructive pulmonary disease, unspecified: Secondary | ICD-10-CM | POA: Diagnosis not present

## 2021-02-14 DIAGNOSIS — K648 Other hemorrhoids: Secondary | ICD-10-CM | POA: Diagnosis not present

## 2021-02-14 DIAGNOSIS — Z8249 Family history of ischemic heart disease and other diseases of the circulatory system: Secondary | ICD-10-CM | POA: Insufficient documentation

## 2021-02-14 DIAGNOSIS — Z7951 Long term (current) use of inhaled steroids: Secondary | ICD-10-CM | POA: Diagnosis not present

## 2021-02-14 DIAGNOSIS — Z9049 Acquired absence of other specified parts of digestive tract: Secondary | ICD-10-CM | POA: Insufficient documentation

## 2021-02-14 DIAGNOSIS — K635 Polyp of colon: Secondary | ICD-10-CM | POA: Diagnosis not present

## 2021-02-14 DIAGNOSIS — Z596 Low income: Secondary | ICD-10-CM | POA: Diagnosis not present

## 2021-02-14 DIAGNOSIS — Z79899 Other long term (current) drug therapy: Secondary | ICD-10-CM | POA: Diagnosis not present

## 2021-02-14 DIAGNOSIS — K219 Gastro-esophageal reflux disease without esophagitis: Secondary | ICD-10-CM | POA: Insufficient documentation

## 2021-02-14 DIAGNOSIS — K621 Rectal polyp: Secondary | ICD-10-CM | POA: Diagnosis not present

## 2021-02-14 HISTORY — PX: COLONOSCOPY: SHX5424

## 2021-02-14 SURGERY — COLONOSCOPY
Anesthesia: General

## 2021-02-14 MED ORDER — PROPOFOL 10 MG/ML IV BOLUS
INTRAVENOUS | Status: DC | PRN
  Administered 2021-02-14: 50 mg via INTRAVENOUS

## 2021-02-14 MED ORDER — PROPOFOL 500 MG/50ML IV EMUL
INTRAVENOUS | Status: AC
  Filled 2021-02-14: qty 50

## 2021-02-14 MED ORDER — PROPOFOL 500 MG/50ML IV EMUL
INTRAVENOUS | Status: DC | PRN
  Administered 2021-02-14: 150 ug/kg/min via INTRAVENOUS

## 2021-02-14 MED ORDER — SODIUM CHLORIDE 0.9 % IV SOLN
INTRAVENOUS | Status: DC
  Administered 2021-02-14: 20 mL/h via INTRAVENOUS

## 2021-02-14 MED ORDER — LIDOCAINE HCL (CARDIAC) PF 100 MG/5ML IV SOSY
PREFILLED_SYRINGE | INTRAVENOUS | Status: DC | PRN
  Administered 2021-02-14: 50 mg via INTRAVENOUS

## 2021-02-14 NOTE — Anesthesia Procedure Notes (Signed)
Procedure Name: MAC Date/Time: 02/14/2021 9:18 AM Performed by: Jerrye Noble, CRNA Pre-anesthesia Checklist: Patient identified, Emergency Drugs available, Suction available and Patient being monitored Patient Re-evaluated:Patient Re-evaluated prior to induction Oxygen Delivery Method: Nasal cannula

## 2021-02-14 NOTE — Anesthesia Preprocedure Evaluation (Addendum)
Anesthesia Evaluation  Patient identified by MRN, date of birth, ID band Patient awake    Reviewed: Allergy & Precautions, NPO status , Patient's Chart, lab work & pertinent test results  Airway Mallampati: II  TM Distance: >3 FB Neck ROM: Full    Dental no notable dental hx. (+) Teeth Intact   Pulmonary COPD,  COPD inhaler, Current Smoker and Patient abstained from smoking.,    Pulmonary exam normal        Cardiovascular Exercise Tolerance: Good Normal cardiovascular examI     Neuro/Psych    GI/Hepatic GERD  ,  Endo/Other    Renal/GU      Musculoskeletal   Abdominal Normal abdominal exam  (+)   Peds  Hematology   Anesthesia Other Findings   Reproductive/Obstetrics                            Anesthesia Physical Anesthesia Plan  ASA: 2  Anesthesia Plan: General   Post-op Pain Management:    Induction: Intravenous  PONV Risk Score and Plan:   Airway Management Planned: Simple Face Mask  Additional Equipment: None  Intra-op Plan:   Post-operative Plan:   Informed Consent: I have reviewed the patients History and Physical, chart, labs and discussed the procedure including the risks, benefits and alternatives for the proposed anesthesia with the patient or authorized representative who has indicated his/her understanding and acceptance.       Plan Discussed with: CRNA  Anesthesia Plan Comments:         Anesthesia Quick Evaluation

## 2021-02-14 NOTE — H&P (Signed)
Peter Bouillon, MD [address], [place], [place], [address] [address], [address], [place], [place], [address] Phone: [phone]  Fax: [phone]  Primary Care Physician:  [facility], [place]   Pre-Procedure History & Physical: HPI:  Peter [name] is a 55 y.o. male is here for a colonoscopy.   Past Medical History:  Diagnosis Date   Arthritis    neck, shoulders   COPD (chronic obstructive pulmonary disease) (HCC)    Diverticulitis    Generalized headaches    GERD (gastroesophageal reflux disease)    occ   History of colonoscopy with polypectomy 09/08/2018   Aug 16, 2015   Lower back pain    Rectal pain    Thrombosed hemorrhoids     Past Surgical History:  Procedure Laterality Date   ANTERIOR CERVICAL DECOMPRESSION/DISCECTOMY FUSION 4 LEVELS N/A 02/15/2019   Procedure: ANTERIOR CERVICAL DECOMPRESSION/DISCECTOMY FUSION 4 LEVELS C3-7;  Surgeon: Venetia Night, MD;  Location: [facility];  Service: Neurosurgery;  Laterality: N/A;   APPENDECTOMY  1988   BLADDER REPAIR N/A 09/11/2015   Procedure: BLADDER REPAIR;  Surgeon: Hildred Laser, MD;  Location: [facility];  Service: Urology;  Laterality: N/A;   CHOLECYSTECTOMY  1989   COLON SURGERY     for diverticulitis   COLONOSCOPY WITH PROPOFOL N/A 08/16/2015   Procedure: COLONOSCOPY WITH PROPOFOL;  Surgeon: Midge Minium, MD;  Location: [facility];  Service: Endoscopy;  Laterality: N/A;   CYSTOSCOPY WITH STENT PLACEMENT Bilateral 09/11/2015   Procedure: CYSTOSCOPY WITH STENT PLACEMENT;  Surgeon: Hildred Laser, MD;  Location: [facility];  Service: Urology;  Laterality: Bilateral;   ILEOSTOMY CLOSURE N/A 11/19/2015   Procedure: ILEOSTOMY TAKEDOWN;  Surgeon: Gladis Riffle, MD;  Location: [facility];  Service: General;  Laterality: N/A;   LAPAROSCOPIC PARTIAL COLECTOMY N/A 09/11/2015   Procedure: LAPAROSCOPIC PARTIAL COLECTOMY Possible Open, possible ostomy, repair of colovesical fistula  Cystoscopy and stent placement, repair of colovesical fistula with Dr. Sherryl Barters as well ;  Surgeon: Gladis Riffle, MD;  Location: [facility];  Service: General;  Laterality: N/A;   POLYPECTOMY  08/16/2015   Procedure: POLYPECTOMY;  Surgeon: Midge Minium, MD;  Location: [facility];  Service: Endoscopy;;   TONSILLECTOMY  1970    Prior to Admission medications   Medication Sig Start Date End Date Taking? Authorizing Provider  ciclopirox (LOPROX) 0.77 % cream Apply topically as directed. Qd to bid to hands, feet, and around nails 11/04/20  Yes Willeen Niece, MD  diazepam (VALIUM) 10 MG tablet Take 1 tablet (10 mg total) by mouth 60 (sixty) minutes before procedure for 1 dose. Take with a sip of water, on an empty stomach. Do not eat anything for 6 hours prior to procedure. 02/03/21  Yes Delano Metz, MD  fluticasone-salmeterol (ADVAIR DISKUS) 250-50 MCG/ACT AEPB Inhale 1 puff into the lungs in the morning and at bedtime. 01/17/21  Yes Danelle Berry, PA-C  hydroxychloroquine (PLAQUENIL) 200 MG tablet Take 200 mg by mouth 2 (two) times daily. 09/19/20  Yes [provider]  Ixekizumab (TALTZ) 80 MG/ML SOAJ Inject 160 mg into the skin as directed. Inject 160 mg (2 x 80 mg) subcutaneous at week 0, then begin first induction dose 80 mg (1 x 80 mg) 2 weeks later (week 2). 01/20/21  Yes Willeen Niece, MD  Ixekizumab (TALTZ) 80 MG/ML SOAJ Inject 80 mg into the skin every 14 (fourteen) days. Weeks 4-10. 01/20/21  Yes Willeen Niece, MD  Ixekizumab (TALTZ) 80 MG/ML Ivory Broad  Inject 80 mg into the skin every 28 (twenty-eight) days. For maintenance. 01/20/21  Yes Willeen Niece, MD  omeprazole (PRILOSEC OTC) 20 MG tablet Take 20 mg by mouth every morning.   Yes [provider]  ondansetron (ZOFRAN) 4 MG tablet Take 1 tablet (4 mg total) by mouth daily as needed. 11/15/20 11/15/21 Yes Willy Eddy, MD  polyethylene glycol-electrolytes (GAVILYTE-N WITH FLAVOR PACK) 420 g solution Drink one 8 oz glass  every 20 mins until entire container is finished starting at 5:00pm on 02/13/21 01/24/21  Yes Pasty Spillers, MD    Allergies as of 01/22/2021   (No Known Allergies)    Family History  Problem Relation Age of Onset   Cholecystitis Mother    Lung cancer Father    Emphysema Paternal Grandmother    Diabetes Paternal Grandmother    Prostate cancer Maternal Uncle    Diabetes Brother    Hypertension Brother     Social History   Socioeconomic History   Marital status: Single    Spouse name: Not on file   Number of children: Not on file   Years of education: Not on file   Highest education level: GED or equivalent  Occupational History   Not on file  Tobacco Use   Smoking status: Every Day    Packs/day: 1.00    Years: 42.00    Pack years: 42.00    Types: Cigarettes   Smokeless tobacco: Never  Vaping Use   Vaping Use: Never used  Substance and Sexual Activity   Alcohol use: No    Alcohol/week: 0.0 standard drinks   Drug use: No   Sexual activity: Not Currently  Other Topics Concern   Not on file  Social History Narrative   Not on file   Social Determinants of Health   Financial Resource Strain: Medium Risk   Difficulty of Paying Living Expenses: Somewhat hard  Food Insecurity: No Food Insecurity   Worried About Programme researcher, broadcasting/film/video in the Last Year: Never true   Ran Out of Food in the Last Year: Never true  Transportation Needs: No Transportation Needs   Lack of Transportation (Medical): No   Lack of Transportation (Non-Medical): No  Physical Activity: Sufficiently Active   Days of Exercise per Week: 7 days   Minutes of Exercise per Session: 60 min  Stress: Stress Concern Present   Feeling of Stress : Very much  Social Connections: Socially Isolated   Frequency of Communication with Friends and Family: More than three times a week   Frequency of Social Gatherings with Friends and Family: Patient refused   Attends Religious Services: Never   Doctor, general practice or Organizations: No   Attends Engineer, structural: Never   Marital Status: Never married  Catering manager Violence: Unknown   Fear of Current or Ex-Partner: No   Emotionally Abused: No   Physically Abused: Patient refused   Sexually Abused: No    Review of Systems: See HPI, otherwise negative ROS  Physical Exam: Constitutional: General:   Alert,  Well-developed, well-nourished, pleasant and cooperative in NAD BP 124/79   Pulse 77   Temp (!) 94.4 F (34.7 C) (Temporal)   Resp 20   Ht 5\' 9"  (1.753 m)   Wt 95.3 kg   SpO2 97%   BMI 31.01 kg/m   Head: Normocephalic, atraumatic.   Eyes:  Sclera clear, no icterus.   Conjunctiva pink.   Mouth:  No deformity or lesions, oropharynx pink &  moist.  Neck:  Supple, trachea midline  Respiratory: Normal respiratory effort  Gastrointestinal:  Soft, non-tender and non-distended without masses, hepatosplenomegaly or hernias noted.  No guarding or rebound tenderness.     Cardiac: No clubbing or edema.  No cyanosis. Normal posterior tibial pedal pulses noted.  Lymphatic:  No significant cervical adenopathy.  Psych:  Alert and cooperative. Normal mood and affect.  Musculoskeletal:   Symmetrical without gross deformities. 5/5 Lower extremity strength bilaterally.  Skin: Warm. Intact without significant lesions or rashes. No jaundice.  Neurologic:  Face symmetrical, tongue midline, Normal sensation to touch;  grossly normal neurologically.  Psych:  Alert and oriented x3, Alert and cooperative. Normal mood and affect.  Impression/Plan: Peter [name] is here for a colonoscopy to be performed for history of tubular adenoma polyp in 2017  Risks, benefits, limitations, and alternatives regarding  colonoscopy have been reviewed with the patient.  Questions have been answered.  All parties agreeable.   Pasty Spillers, MD  02/14/2021, 9:14 AM

## 2021-02-14 NOTE — Anesthesia Postprocedure Evaluation (Signed)
Anesthesia Post Note  Patient: Scientist, research (life sciences)  Procedure(s) Performed: COLONOSCOPY  Patient location during evaluation: PACU Anesthesia Type: General Level of consciousness: awake and alert Pain management: pain level controlled Vital Signs Assessment: post-procedure vital signs reviewed and stable Respiratory status: spontaneous breathing, nonlabored ventilation, respiratory function stable and patient connected to nasal cannula oxygen Cardiovascular status: blood pressure returned to baseline and stable Postop Assessment: no apparent nausea or vomiting Anesthetic complications: no   No notable events documented.   Last Vitals:  Vitals:   02/14/21 0954 02/14/21 1004  BP: (!) 107/92 113/86  Pulse: 70 66  Resp: 19 12  Temp: (!) 36.1 C   SpO2: 94% 94%    Last Pain:  Vitals:   02/14/21 0954  TempSrc: Temporal  PainSc: Asleep                 Cathleen Corti

## 2021-02-14 NOTE — Op Note (Signed)
[facility] Gastroenterology Patient Name: Peter FalconerMark [name] Procedure Date: 02/14/2021 9:15 AM MRN: [id number] Account #: [id number] Date of Birth: 09/17/1965 Admit Type: Outpatient Age: [id number] [facility] Gender: Male Note Status: Finalized Procedure:             Colonoscopy Indications:           High risk colon cancer surveillance: Personal history                         of colonic polyps Providers:             Angeliyah Kirkey B. Maximino Greenlandahiliani MD, MD Referring MD:          Feliciana Rossettilifford D. Dorris FetchHendrickson (Referring MD) Medicines:             Monitored Anesthesia Care Complications:         No immediate complications. Procedure:             Pre-Anesthesia Assessment:                        - ASA Grade Assessment: II - A patient with mild                         systemic disease.                        - Prior to the procedure, a History and Physical was                         performed, and patient medications, allergies and                         sensitivities were reviewed. The patient's tolerance                         of previous anesthesia was reviewed.                        - The risks and benefits of the procedure and the                         sedation options and risks were discussed with the                         patient. All questions were answered and informed                         consent was obtained.                        - Patient identification and proposed procedure were                         verified prior to the procedure by the physician, the                         nurse, the anesthesiologist, the anesthetist and the                         technician. The procedure was  verified in the                         procedure room.                        After obtaining informed consent, the colonoscope was                         passed under direct vision. Throughout the procedure,                         the patient's blood pressure, pulse, and  oxygen                         saturations were monitored continuously. The                         Colonoscope was introduced through the anus and                         advanced to the the terminal ileum. The colonoscopy                         was performed with ease. The patient tolerated the                         procedure well. The quality of the bowel preparation                         was fair except the ascending colon was poor and the                         cecum was poor. Findings:      The perianal and digital rectal examinations were normal.      A 5 mm polyp was found in the rectum. The polyp was sessile. The polyp       was removed with a cold snare. Resection and retrieval were complete.      A large amount of solid stool was found in the ascending colon and in       the cecum, making visualization difficult.      There was evidence of a prior surgical anastomosis in the sigmoid colon.       This was patent and was characterized by healthy appearing mucosa.      The exam was otherwise without abnormality.      Non-bleeding internal hemorrhoids were found during retroflexion. Impression:            - One 5 mm polyp in the rectum, removed with a cold                         snare. Resected and retrieved.                        - Stool in the ascending colon and in the cecum.                        - Patent surgical anastomosis, characterized by  healthy appearing mucosa.                        - The examination was otherwise normal.                        - Non-bleeding internal hemorrhoids. Recommendation:        - Discharge patient to home (with escort).                        - Advance diet as tolerated.                        - Continue present medications.                        - Await pathology results.                        - Repeat colonoscopy within 3-6 months with 2 day prep.                        - The findings and recommendations  were discussed with                         the patient.                        - The findings and recommendations were discussed with                         the patient's family.                        - Return to primary care physician as previously                         scheduled.                        - High fiber diet. Procedure Code(s):     --- Professional ---                        [phone], Colonoscopy, flexible; with removal of                         tumor(s), polyp(s), or other lesion(s) by snare                         technique Diagnosis Code(s):     --- Professional ---                        Z86.010, Personal history of colonic polyps                        K62.1, Rectal polyp CPT copyright 2019 [employer]. All rights reserved. The codes documented in this report are preliminary and upon coder review may  be revised to meet current compliance requirements.  Melodie Bouillon, MD Michel Bickers B. Maximino Greenland MD, MD 02/14/2021 10:06:24 AM This report has been signed electronically. Number of Addenda: 0 Note Initiated  On: 02/14/2021 9:15 AM Scope Withdrawal Time: 0 hours 13 minutes 17 seconds  Total Procedure Duration: 0 hours 25 minutes 36 seconds  Estimated Blood Loss:  Estimated blood loss: none.      [facility]

## 2021-02-14 NOTE — Transfer of Care (Signed)
Immediate Anesthesia Transfer of Care Note  Patient: Peter [name]  Procedure(s) Performed: COLONOSCOPY  Patient Location: PACU and Endoscopy Unit  Anesthesia Type:General  Level of Consciousness: drowsy and patient cooperative  Airway & Oxygen Therapy: Patient Spontanous Breathing  Post-op Assessment: Report given to RN and Post -op Vital signs reviewed and stable  Post vital signs: Reviewed and stable  Last Vitals:  Vitals Value Taken Time  BP    Temp    Pulse    Resp    SpO2      Last Pain:  Vitals:   02/14/21 0754  TempSrc: Temporal  PainSc: 0-No pain         Complications: No notable events documented.

## 2021-02-14 NOTE — Telephone Encounter (Signed)
Post procedure phone call.  LM 

## 2021-02-17 ENCOUNTER — Encounter: Payer: Self-pay | Admitting: Gastroenterology

## 2021-02-17 LAB — SURGICAL PATHOLOGY

## 2021-02-19 ENCOUNTER — Encounter: Payer: Self-pay | Admitting: Gastroenterology

## 2021-02-25 ENCOUNTER — Other Ambulatory Visit: Payer: Self-pay

## 2021-02-25 ENCOUNTER — Ambulatory Visit: Payer: BC Managed Care – PPO | Admitting: Dermatology

## 2021-02-25 DIAGNOSIS — L409 Psoriasis, unspecified: Secondary | ICD-10-CM

## 2021-02-25 DIAGNOSIS — B351 Tinea unguium: Secondary | ICD-10-CM | POA: Diagnosis not present

## 2021-02-25 DIAGNOSIS — B352 Tinea manuum: Secondary | ICD-10-CM | POA: Diagnosis not present

## 2021-02-25 DIAGNOSIS — L4050 Arthropathic psoriasis, unspecified: Secondary | ICD-10-CM | POA: Diagnosis not present

## 2021-02-25 MED ORDER — IXEKIZUMAB 80 MG/ML ~~LOC~~ SOAJ
80.0000 mg | Freq: Once | SUBCUTANEOUS | Status: AC
  Administered 2021-02-25: 80 mg via SUBCUTANEOUS

## 2021-02-25 NOTE — Progress Notes (Signed)
   Follow-Up Visit   Subjective  Peter [name] is a 55 y.o. male who presents for the following: Psoriasis (Patient also has psoriatic arthritis. Patient had loading dose of Taltz 2 weeks ago. He tolerated well and states he has seen a little improvement. ) and Tinea Manus (Hands. Patient is improved after terbinafine treatment. He is using ciclopirox cream to hands and feet twice a day.).Also had tinea unguium which was treated and improved.    The following portions of the chart were reviewed this encounter and updated as appropriate:       Review of Systems:  No other skin or systemic complaints except as noted in HPI or Assessment and Plan.  Objective  Well appearing patient in no apparent distress; mood and affect are within normal limits.  A focused examination was performed including arms, hands. Relevant physical exam findings are noted in the Assessment and Plan.  arms, legs Well demarcated light pink patches on MCPs/PIPs; joint enlargement of the MCPs, PIPs, and DIPs. Nail dystrophy BL thumbnails with thickening and subungual debris  hands Residual nail dystrophy of the thumb nails with thickening and subungual debris, palms clear, fingernails much improved when compared to baseline photos.   Assessment & Plan  Psoriasis arms, legs  Psoriasis with Psoriatic Arthritis- improving joint symptoms  Psoriasis - severe on systemic "biologic" treatment injections.  Psoriasis is a chronic non-curable, but treatable genetic/hereditary disease that may have other systemic features affecting other organ systems such as joints (Psoriatic Arthritis).  It is linked with heart disease, inflammatory bowel disease, non-alcoholic fatty liver disease, and depression. Significant skin psoriasis and/or psoriatic arthritis may have significant symptoms and affects activities of daily activity and often benefits from systemic "biologic" injection treatments.  These "biologic" treatments have some  potential side effects including immunosuppression and require pre-treatment laboratory screening and periodic laboratory monitoring and periodic in person evaluation and monitoring by the attending dermatologist physician.    Taltz 80mg  injected to right upper arm today- 2nd week injection. Patient tolerated well. Lot AC Exp 08/28/2022. Sample used today. Patient's Rx not here yet.  Continue injections q2wk for total 12 weeks Pt prefers to have medication shipped to the office, and come in for injections.  Reviewed risks of biologics including immunosuppression, infections, injection site reaction, and failure to improve condition. Goal is control of skin condition, not cure.  Some older biologics such as Humira and Enbrel may slightly increase risk of malignancy and may worsen congestive heart failure. The use of biologics requires long term medication management, including periodic office visits and monitoring of blood work.   Related Medications Ixekizumab SOAJ 80 mg   Tinea manus hands  /Tinea Unguium  Improved after oral terbinafine course.   Continue Ciclopirox Cream qD to hands and feet and nails to prevent recurrence.   Return in about 2 weeks (around 03/11/2021) for nurse visit for Taltz injection. Pt to see Dr 05/11/2021 in ~10 weeks at end of loading regimen.  IKathie Rhodes, CMA, am acting as scribe for Cherlyn Labella, MD . Documentation: I have reviewed the above documentation for accuracy and completeness, and I agree with the above.  Willeen Niece MD

## 2021-02-25 NOTE — Patient Instructions (Signed)
Reviewed risks of biologics including immunosuppression, infections, injection site reaction, and failure to improve condition. Goal is control of skin condition, not cure.  Some older biologics such as Humira and Enbrel may slightly increase risk of malignancy and may worsen congestive heart failure. The use of biologics requires long term medication management, including periodic office visits and monitoring of blood work.    If you have any questions or concerns for your doctor, please call our main line at [phone] and press option 4 to reach your doctor's medical assistant. If no one answers, please leave a voicemail as directed and we will return your call as soon as possible. Messages left after 4 pm will be answered the following business day.   You may also send us a message via MyChart. We typically respond to MyChart messages within 1-2 business days.  For prescription refills, please ask your pharmacy to contact our office. Our fax number is [phone].  If you have an urgent issue when the clinic is closed that cannot wait until the next business day, you can page your doctor at the number below.    Please note that while we do our best to be available for urgent issues outside of office hours, we are not available 24/7.   If you have an urgent issue and are unable to reach us, you may choose to seek medical care at your doctor's office, retail clinic, urgent care center, or emergency room.  If you have a medical emergency, please immediately call 911 or go to the emergency department.  Pager Numbers  - Dr. Kowalski: [phone]  - Dr. Moye: [phone]  - Dr. Stewart: [phone]  In the event of inclement weather, please call our main line at [phone] for an update on the status of any delays or closures.  Dermatology Medication Tips: Please keep the boxes that topical medications come in in order to help keep track of the instructions about where and how to  use these. Pharmacies typically print the medication instructions only on the boxes and not directly on the medication tubes.   If your medication is too expensive, please contact our office at [phone] option 4 or send us a message through MyChart.   We are unable to tell what your co-pay for medications will be in advance as this is different depending on your insurance coverage. However, we may be able to find a substitute medication at lower cost or fill out paperwork to get insurance to cover a needed medication.   If a prior authorization is required to get your medication covered by your insurance company, please allow us 1-2 business days to complete this process.  Drug prices often vary depending on where the prescription is filled and some pharmacies may offer cheaper prices.  The website www.goodrx.com contains coupons for medications through different pharmacies. The prices here do not account for what the cost may be with help from insurance (it may be cheaper with your insurance), but the website can give you the price if you did not use any insurance.  - You can print the associated coupon and take it with your prescription to the pharmacy.  - You may also stop by our office during regular business hours and pick up a GoodRx coupon card.  - If you need your prescription sent electronically to a different pharmacy, notify our office through [facility] MyChart or by phone at [phone] option 4.  

## 2021-03-11 ENCOUNTER — Other Ambulatory Visit: Payer: Self-pay

## 2021-03-11 ENCOUNTER — Ambulatory Visit (INDEPENDENT_AMBULATORY_CARE_PROVIDER_SITE_OTHER): Payer: BC Managed Care – PPO

## 2021-03-11 DIAGNOSIS — L400 Psoriasis vulgaris: Secondary | ICD-10-CM

## 2021-03-11 MED ORDER — IXEKIZUMAB 80 MG/ML ~~LOC~~ SOAJ
80.0000 mg | Freq: Once | SUBCUTANEOUS | Status: AC
  Administered 2021-03-11: 80 mg via SUBCUTANEOUS

## 2021-03-11 NOTE — Progress Notes (Signed)
Patient here for two week injection of Taltz. Patient on "week 4 of dosing". Patient scheduled at week 12 of dosing for follow up with Dr. Roseanne Reno.   Taltz 80mg  injected into patients left upper arm. Patient tolerated well.  LOT: JC EXP: 09/08/2022

## 2021-03-20 ENCOUNTER — Ambulatory Visit: Payer: BC Managed Care – PPO | Attending: Pain Medicine | Admitting: Pain Medicine

## 2021-03-20 ENCOUNTER — Other Ambulatory Visit: Payer: Self-pay

## 2021-03-20 DIAGNOSIS — G8929 Other chronic pain: Secondary | ICD-10-CM

## 2021-03-20 DIAGNOSIS — M47816 Spondylosis without myelopathy or radiculopathy, lumbar region: Secondary | ICD-10-CM | POA: Diagnosis not present

## 2021-03-20 DIAGNOSIS — R937 Abnormal findings on diagnostic imaging of other parts of musculoskeletal system: Secondary | ICD-10-CM

## 2021-03-20 DIAGNOSIS — M47817 Spondylosis without myelopathy or radiculopathy, lumbosacral region: Secondary | ICD-10-CM

## 2021-03-20 DIAGNOSIS — M545 Low back pain, unspecified: Secondary | ICD-10-CM

## 2021-03-20 NOTE — Progress Notes (Signed)
Patient: Peter [name]  Service Category: E/M  Provider: Gaspar Cola, MD  DOB: [birth year]  DOS: 03/20/2021  Location: Office  MRN: [id number]  Setting: Ambulatory outpatient  Referring Provider: Towanda Malkin*  Type: Established Patient  Specialty: Interventional Pain Management  PCP: [address] [facility] [place]  Location: Remote location  Delivery: TeleHealth     Virtual Encounter - Pain Management PROVIDER NOTE: Information contained herein reflects review and annotations entered in association with encounter. Interpretation of such information and data should be left to medically-trained personnel. Information provided to patient can be located elsewhere in the medical record under "Patient Instructions". Document created using STT-dictation technology, any transcriptional errors that may result from process are unintentional.    Contact & Pharmacy Preferred: [phone] Home: [phone] (home) Mobile: [phone] (mobile) E-mail: No e-mail address on record  [id number] [place] [address] [place]Phone: [phone]: [address] [place] [address]Phone: [phone]: [phone]  Pre-screening  Peter [name] offered "in-person" vs "virtual" encounter. He indicated preferring virtual for this encounter.   Reason COVID-19*  Social distancing based on CDC and AMA recommendations.   I contacted Peter Percheson 03/20/2021 via telephone.      I clearly identified myself as FGaspar Cola MD. I verified that I was speaking with the correct person using two identifiers (Name: Peter [name] and date of birth: 55-Aug-1967.  Consent I sought verbal advanced consent from Peter Perchesfor virtual visit interactions. I informed Peter [name] of possible security and privacy concerns, risks, and limitations  associated with providing "not-in-person" medical evaluation and management services. I also informed Peter [name] of the availability of "in-person" appointments. Finally, I informed him that there would be a charge for the virtual visit and that he could be  personally, fully or partially, financially responsible for it. Peter [name] understanding and agreed to proceed.   Historic Elements   Mr. Peter BARNERis a 55y.o. year old, male patient evaluated today after our last contact on 02/13/2021. Peter [name] has a past medical history of Arthritis, [place] Divert[place]itis, Generalized headaches, GERD (gastroesophageal reflux disease), History of colonoscopy with polypectomy (09/08/2018), Lower back pain, Rectal pain, and Thrombosed hemorrhoids. He also  has a past surgical history that includes Cholecystectomy (1989); Tonsillectomy (1970); Appendectomy (1988); Colonoscopy with propofol (N/A, 08/16/2015); polypectomy (08/16/2015); Laparoscopic partial colectomy (N/A, 09/11/2015); Cystoscopy with stent placement (Bilateral, 09/11/2015); Bladder repair (N/A, 09/11/2015); Ileostomy closure (N/A, 11/19/2015); Colon surgery; Anterior cervical decompression/discectomy fusion 4 level (N/A, 02/15/2019); and Colonoscopy (N/A, 02/14/2021). Mr. PMaserhas a current medication list which includes the following prescription(s): ciclopirox, fluticasone-salmeterol, hydroxychloroquine, taltz, taltz, taltz, omeprazole, ondansetron, and polyethylene glycol-electrolytes. He  reports that he has been smoking cigarettes. He has a 42.00 pack-year smoking history. He has never used smokeless tobacco. He reports that he does not drink alcohol and does not use drugs. Mr. PMerrowhas No Known Allergies.   HPI  Today, he is being contacted for a post-procedure assessment.  The patient refers having attained 100% relief of the pain for the duration of the local anesthetic which slowly started  going down to about a 75% improvement that persisted for several weeks.  Currently his pain is not as bad as it was before the procedure, but it  is slowly getting there.  He refers that among the benefits that he had taken for the procedure was increasing his range of motion as well as his ability to do more things and feel more like a human being.  He refers that he was able to go back to work and also do some yard work, which he had not been able to do before because of the pain.  Today we spoke about the alternatives including a second diagnostic lumbar facet block to confirm the results from the first.  If again he gets good benefit, the plan would be to then move onto a radiofrequency ablation.  Today I have talked to the patient about that including the risk and possible complications.  He understood and accepted.  He refers that we have been able to provide him with the best relief that he has yet seen.  Post-Procedure Evaluation  Procedure (02/13/2021):  Procedure:           Anesthesia, Analgesia, Anxiolysis:  Type: Lumbar Facet, Medial Branch Block(s)          Primary Purpose: Diagnostic Region: Posterolateral Lumbosacral Spine Level: L2, L3, L4, L5, & S1 Medial Branch Level(s). Injecting these levels blocks the L3-4, L4-5, and L5-S1 lumbar facet joints. Laterality: Bilateral   Type: Minimal (Conscious) Anxiolysis combined with Local Anesthesia Indication(s): Analgesia and Anxiety Route: Intravenous (IV) IV Access: Secured Sedation: Meaningful verbal contact was maintained at all times during the procedure  Local Anesthetic: Lidocaine 1-2%   Position: Prone    Indications: 1. Lumbosacral facet joint syndrome   2. Lumbar facet hypertrophy   3. Spondylosis without myelopathy or radiculopathy, lumbosacral region   4. Degenerative disc disease, lumbar   5. Chronic low back pain (1ry area of Pain) (Bilateral) (R>L) w/o sciatica   6. Anterolisthesis of lumbosacral spine (L5/S1)     Pain  Score: Pre-procedure: 9 /10 Post-procedure: 0-No pain/10   Anxiolysis: Minimal anxiolysis  Effectiveness during initial hour after procedure (Ultra-Short Term Relief): 100 %.  Local anesthetic used: Long-acting (4-6 hours) Effectiveness: Defined as any analgesic benefit obtained secondary to the administration of local anesthetics. This carries significant diagnostic value as to the etiological location, or anatomical origin, of the pain. Duration of benefit is expected to coincide with the duration of the local anesthetic used.  Effectiveness during initial 4-6 hours after procedure (Short-Term Relief): 100 %.  Long-term benefit: Defined as any relief past the pharmacologic duration of the local anesthetics.  Effectiveness past the initial 6 hours after procedure (Long-Term Relief): 75 % (lasting a couple of weeks).  Benefits, current: Defined as benefit present at the time of this evaluation.   Analgesia: The patient indicates having attained 100% relief of the pain for the duration of the local anesthetic which then slowly went down to a 75% improvement that lasted for a couple weeks.  He refers that thanks to the procedure he was able to go back to work and also work on his yard and feel more like a normal human being.  Function: Peter [name] reports improvement in function ROM: Mr. Otterson reports improvement in ROM  Pharmacotherapy Assessment   Analgesic: Hydrocodone/APAP 5/325 1 tab p.o. twice daily (10/01/2020). NO OPIOIDS - Abnormal UDS (11/04/2020): Positive for marijuana MME/day: 10 mg/day   Monitoring:  PMP: PDMP reviewed during this encounter.       Pharmacotherapy: No side-effects or adverse reactions reported. Compliance: No problems identified. Effectiveness: Clinically acceptable. Plan: Refer to "POC". UDS:  Summary  Date  Value Ref Range Status  11/04/2020 Note  Final    Comment:    ==================================================================== Compliance Drug  Analysis, Ur ==================================================================== Test                             Result       Flag       Units  Drug Present not Declared for Prescription Verification   Carboxy-THC                    90           UNEXPECTED ng/mg creat    Carboxy-THC is a metabolite of tetrahydrocannabinol (THC). Source of    THC is most commonly herbal marijuana or marijuana-based products,    but THC is also present in a scheduled prescription medication.    Trace amounts of THC can be present in hemp and cannabidiol (CBD)    products. This test is not intended to distinguish between delta-9-    tetrahydrocannabinol, the predominant form of THC in most herbal or    marijuana-based products, and delta-8-tetrahydrocannabinol.    Gabapentin                     PRESENT      UNEXPECTED   Salicylate                     PRESENT      UNEXPECTED ==================================================================== Test                      Result    Flag   Units      Ref Range   Creatinine              84               mg/dL      >=20 ==================================================================== Declared Medications:  The flagging and interpretation on this report are based on the  following declared medications.  Unexpected results may arise from  inaccuracies in the declared medications.   **Note: The testing scope of this panel does not include the  following reported medications:   Ciclopirox (Loprox)  Fluticasone (Advair)  Hydroxychloroquine (Plaquenil)  Omeprazole (Prilosec)  Salmeterol (Advair) ==================================================================== For clinical consultation, please call [phone]. ====================================================================      Laboratory Chemistry Profile   Renal Lab Results  Component Value Date   BUN 17 01/14/2021   CREATININE 0.99 01/14/2021   BCR 17 01/14/2021   GFRAA 92  02/02/2020   GFRNONAA >60 11/15/2020    Hepatic Lab Results  Component Value Date   AST 23 01/14/2021   ALT 12 01/14/2021   ALBUMIN 4.5 01/14/2021   ALKPHOS 53 01/14/2021   LIPASE 34 12/31/2014    Electrolytes Lab Results  Component Value Date   NA 141 01/14/2021   K 4.7 01/14/2021   CL 102 01/14/2021   CALCIUM 9.5 01/14/2021   MG 2.1 11/04/2020   PHOS 4.2 09/22/2015    Bone Lab Results  Component Value Date   25OHVITD1 22 (L) 11/04/2020   25OHVITD2 <1.0 11/04/2020   25OHVITD3 22 11/04/2020    Inflammation (CRP: Acute Phase) (ESR: Chronic Phase) Lab Results  Component Value Date   CRP <1 11/04/2020   ESRSEDRATE 36 (H) 11/04/2020         Note: Above Lab results reviewed.  Imaging  DG PAIN CLINIC C-ARM 1-60 MIN NO  REPORT Fluoro was used, but no Radiologist interpretation will be provided.  Please refer to "NOTES" tab for provider progress note.  Assessment  The primary encounter diagnosis was Lumbosacral facet joint syndrome. Diagnoses of Lumbar facet hypertrophy, Chronic low back pain (1ry area of Pain) (Bilateral) (R>L) w/o sciatica, Spondylosis without myelopathy or radiculopathy, lumbosacral region, and Abnormal MRI, lumbar spine (07/26/2020) were also pertinent to this visit.  Plan of Care  Problem-specific:  No problem-specific Assessment & Plan notes found for this encounter.  Peter [name] has a current medication list which includes the following long-term medication(s): fluticasone-salmeterol and omeprazole.  Pharmacotherapy (Medications Ordered): No orders of the defined types were placed in this encounter.  Orders:  Orders Placed This Encounter  Procedures   LUMBAR FACET(MEDIAL BRANCH NERVE BLOCK) MBNB    Standing Status:   Future    Standing Expiration Date:   06/19/2021    Scheduling Instructions:     Procedure: Lumbar facet block (AKA.: Lumbosacral medial branch nerve block)     Side: Bilateral     Level: L3-4, L4-5, & L5-S1 Facets (L2,  L3, L4, L5, & S1 Medial Branch Nerves)     Sedation: Patient's choice.     Timeframe: ASAA    Order Specific Question:   Where will this procedure be performed?    Answer:   [facility]   Informed Consent Details: Physician/Practitioner Attestation; Transcribe to consent form and obtain patient signature    Nursing Order: Transcribe to consent form and obtain patient signature. Note: Always confirm laterality of pain with Peter [name], before procedure.    Order Specific Question:   Physician/Practitioner attestation of informed consent for procedure/surgical case    Answer:   I, the physician/practitioner, attest that I have discussed with the patient the benefits, risks, side effects, alternatives, likelihood of achieving goals and potential problems during recovery for the procedure that I have provided informed consent.    Order Specific Question:   Procedure    Answer:   Lumbar Facet Block  under fluoroscopic guidance    Order Specific Question:   Physician/Practitioner performing the procedure    Answer:   Masato Pettie A. Dossie Arbour MD    Order Specific Question:   Indication/Reason    Answer:   Low Back Pain, with our without leg pain, due to Facet Joint Arthralgia (Joint Pain) Spondylosis (Arthritis of the Spine), without myelopathy or radiculopathy (Nerve Damage).    Follow-up plan:   Return for Proc-day (T,Th), (Clinic) procedure: (B) L-FCT BLK #2, (w/ anxiolysis).     Interventional Therapies  Risk  Complexity Considerations:   Estimated body mass index is 31.45 kg/m as calculated from the following:   Height as of this encounter: '5\' 9"'  (1.753 m).   Weight as of this encounter: 213 lb (96.6 kg). NO OPIOIDS!!! - Abnormal UDS (11/04/2020): Positive for marijuana   Planned  Pending:   Pending further evaluation   Under consideration:   Diagnostic bilateral lumbar facet block #1    Completed:   None at this time   Therapeutic  Palliative (PRN) options:   None  established      Recent Visits Date Type Provider Dept  02/13/21 Procedure visit Milinda Pointer, MD [facility]  02/03/21 Office Visit Milinda Pointer, MD [facility]  Showing recent visits within past 90 days and meeting all other requirements Today's Visits Date Type Provider Dept  03/20/21 Telemedicine Milinda Pointer, MD [facility]  Showing today's visits and meeting  all other requirements Future Appointments No visits were found meeting these conditions. Showing future appointments within next 90 days and meeting all other requirements I discussed the assessment and treatment plan with the patient. The patient was provided an opportunity to ask questions and all were answered. The patient agreed with the plan and demonstrated an understanding of the instructions.  Patient advised to call back or seek an in-person evaluation if the symptoms or condition worsens.  Duration of encounter: 18 minutes.  Note by: Gaspar Cola, MD Date: 03/20/2021; Time: 4:59 PM

## 2021-03-20 NOTE — Patient Instructions (Signed)
______________________________________________________________________  Preparing for Procedure with Sedation  NOTICE: Due to recent regulatory changes, starting on February 03, 2021, procedures requiring intravenous (IV) sedation will no longer be performed at the [facility].  These types of procedures are required to be performed at [facility] ambulatory surgery facility.  We are very sorry for the inconvenience.  Procedure appointments are limited to planned procedures: No Prescription Refills. No disability issues will be discussed. No medication changes will be discussed.  Instructions: Oral Intake: Do not eat or drink anything for at least 8 hours prior to your procedure. (Exception: Blood Pressure Medication. See below.) Transportation: A driver is required. You may not drive yourself after the procedure. Blood Pressure Medicine: Do not forget to take your blood pressure medicine with a sip of water the morning of the procedure. If your Diastolic (lower reading) is above 100 mmHg, elective cases will be cancelled/rescheduled. Blood thinners: These will need to be stopped for procedures. Notify our staff if you are taking any blood thinners. Depending on which one you take, there will be specific instructions on how and when to stop it. Diabetics on insulin: Notify the staff so that you can be scheduled 1st case in the morning. If your diabetes requires high dose insulin, take only  of your normal insulin dose the morning of the procedure and notify the staff that you have done so. Preventing infections: Shower with an antibacterial soap the morning of your procedure. Build-up your immune system: Take 1000 mg of Vitamin C with every meal (3 times a day) the day prior to your procedure. Antibiotics: Inform the staff if you have a condition or reason that requires you to take antibiotics before dental procedures. Pregnancy: If you are pregnant, call and cancel the procedure. Sickness: If  you have a cold, fever, or any active infections, call and cancel the procedure. Arrival: You must be in the facility at least 30 minutes prior to your scheduled procedure. Children: Do not bring children with you. Dress appropriately: Bring dark clothing that you would not mind if they get stained. Valuables: Do not bring any jewelry or valuables.  Reasons to call and reschedule or cancel your procedure: (Following these recommendations will minimize the risk of a serious complication.) Surgeries: Avoid having procedures within 2 weeks of any surgery. (Avoid for 2 weeks before or after any surgery). Flu Shots: Avoid having procedures within 2 weeks of a flu shots. (Avoid for 2 weeks before or after immunizations). Barium: Avoid having a procedure within 7-10 days after having had a radiological study involving the use of radiological contrast. (Myelograms, Barium swallow or enema study). Heart attacks: Avoid any elective procedures or surgeries for the initial 6 months after a "Myocardial Infarction" (Heart Attack). Blood thinners: It is imperative that you stop these medications before procedures. Let us know if you if you take any blood thinner.  Infection: Avoid procedures during or within two weeks of an infection (including chest colds or gastrointestinal problems). Symptoms associated with infections include: Localized redness, fever, chills, night sweats or profuse sweating, burning sensation when voiding, cough, congestion, stuffiness, runny nose, sore throat, diarrhea, nausea, vomiting, cold or Flu symptoms, recent or current infections. It is specially important if the infection is over the area that we intend to treat. Heart and lung problems: Symptoms that may suggest an active cardiopulmonary problem include: cough, chest pain, breathing difficulties or shortness of breath, dizziness, ankle swelling, uncontrolled high or unusually low blood pressure, and/or palpitations. If you are    experiencing any of these symptoms, cancel your procedure and contact your primary care physician for an evaluation.  Remember:  Regular Business hours are:  Monday to Thursday 8:00 AM to 4:00 PM  Provider's Schedule: Noya Santarelli, MD:  Procedure days: Tuesday and Thursday 7:30 AM to 4:00 PM  Bilal Lateef, MD:  Procedure days: Monday and Wednesday 7:30 AM to 4:00 PM ______________________________________________________________________  ____________________________________________________________________________________________  General Risks and Possible Complications  Patient Responsibilities: It is important that you read this as it is part of your informed consent. It is our duty to inform you of the risks and possible complications associated with treatments offered to you. It is your responsibility as a patient to read this and to ask questions about anything that is not clear or that you believe was not covered in this document.  Patient's Rights: You have the right to refuse treatment. You also have the right to change your mind, even after initially having agreed to have the treatment done. However, under this last option, if you wait until the last second to change your mind, you may be charged for the materials used up to that point.  Introduction: Medicine is not an exact science. Everything in Medicine, including the lack of treatment(s), carries the potential for danger, harm, or loss (which is by definition: Risk). In Medicine, a complication is a secondary problem, condition, or disease that can aggravate an already existing one. All treatments carry the risk of possible complications. The fact that a side effects or complications occurs, does not imply that the treatment was conducted incorrectly. It must be clearly understood that these can happen even when everything is done following the highest safety standards.  No treatment: You can choose not to proceed with the  proposed treatment alternative. The "PRO(s)" would include: avoiding the risk of complications associated with the therapy. The "CON(s)" would include: not getting any of the treatment benefits. These benefits fall under one of three categories: diagnostic; therapeutic; and/or palliative. Diagnostic benefits include: getting information which can ultimately lead to improvement of the disease or symptom(s). Therapeutic benefits are those associated with the successful treatment of the disease. Finally, palliative benefits are those related to the decrease of the primary symptoms, without necessarily curing the condition (example: decreasing the pain from a flare-up of a chronic condition, such as incurable terminal cancer).  General Risks and Complications: These are associated to most interventional treatments. They can occur alone, or in combination. They fall under one of the following six (6) categories: no benefit or worsening of symptoms; bleeding; infection; nerve damage; allergic reactions; and/or death. No benefits or worsening of symptoms: In Medicine there are no guarantees, only probabilities. No healthcare provider can ever guarantee that a medical treatment will work, they can only state the probability that it may. Furthermore, there is always the possibility that the condition may worsen, either directly, or indirectly, as a consequence of the treatment. Bleeding: This is more common if the patient is taking a blood thinner, either prescription or over the counter (example: Goody Powders, Fish oil, Aspirin, Garlic, etc.), or if suffering a condition associated with impaired coagulation (example: Hemophilia, cirrhosis of the liver, low platelet counts, etc.). However, even if you do not have one on these, it can still happen. If you have any of these conditions, or take one of these drugs, make sure to notify your treating physician. Infection: This is more common in patients with a compromised  immune system, either due to disease (example:   diabetes, cancer, human immunodeficiency virus [HIV], etc.), or due to medications or treatments (example: therapies used to treat cancer and rheumatological diseases). However, even if you do not have one on these, it can still happen. If you have any of these conditions, or take one of these drugs, make sure to notify your treating physician. Nerve Damage: This is more common when the treatment is an invasive one, but it can also happen with the use of medications, such as those used in the treatment of cancer. The damage can occur to small secondary nerves, or to large primary ones, such as those in the spinal cord and brain. This damage may be temporary or permanent and it may lead to impairments that can range from temporary numbness to permanent paralysis and/or brain death. Allergic Reactions: Any time a substance or material comes in contact with our body, there is the possibility of an allergic reaction. These can range from a mild skin rash (contact dermatitis) to a severe systemic reaction (anaphylactic reaction), which can result in death. Death: In general, any medical intervention can result in death, most of the time due to an unforeseen complication. ____________________________________________________________________________________________  

## 2021-03-21 ENCOUNTER — Telehealth: Payer: Self-pay

## 2021-03-21 NOTE — Telephone Encounter (Signed)
Attempted to call for pre procedure instructions.  Was unable to reach patient.  When you call to schedule appointment, please send to nurses to see if he is with or without sedation, blood thinner status so that we can give him instructions.  Thanks

## 2021-03-31 NOTE — Progress Notes (Signed)
PROVIDER NOTE: Information contained herein reflects review and annotations entered in association with encounter. Interpretation of such information and data should be left to medically-trained personnel. Information provided to patient can be located elsewhere in the medical record under "Patient Instructions". Document created using STT-dictation technology, any transcriptional errors that may result from process are unintentional.    Patient: Peter [name]  Service Category: Procedure  Provider: Oswaldo Done, MD  DOB: [birth year]  DOS: 04/01/2021  Location: [facility]  MRN: [id number]  Setting: Ambulatory - outpatient  Referring Provider: Delano Metz, MD  Type: Established Patient  Specialty: Interventional Pain Management  PCP: [facility]   Primary Reason for Visit: Interventional Pain Management Treatment. CC: Back Pain (lower)    Procedure:          Anesthesia, Analgesia, Anxiolysis:  Type: Lumbar Facet, Medial Branch Block(s) #2  Primary Purpose: Diagnostic Region: Posterolateral Lumbosacral Spine Level: L2, L3, L4, L5, & S1 Medial Branch Level(s). Injecting these levels blocks the L3-4, L4-5, and L5-S1 lumbar facet joints. Laterality: Bilateral  Type: Local Anesthesia Local Anesthetic: Lidocaine 1-2% Sedation: Minimal Anxiolysis  Indication(s): Anxiety & Analgesia Route: Infiltration ([place]/IM) IV Access: Available   Position: Prone   Indications: 1. Lumbosacral facet joint syndrome   2. Lumbar facet hypertrophy   3. Spondylosis without myelopathy or radiculopathy, lumbosacral region   4. DDD (degenerative disc disease), lumbosacral   5. Chronic low back pain (1ry area of Pain) (Bilateral) (R>L) w/o sciatica   6. Abnormal MRI, lumbar spine (07/26/2020)    Pain Score: Pre-procedure: 8 /10 Post-procedure: 5 /10     Pre-op H&P Assessment:  Peter [name] is a 55 y.o. (year old), male patient, seen today for interventional  treatment. He  has a past surgical history that includes Cholecystectomy (1989); Tonsillectomy (1970); Appendectomy (1988); Colonoscopy with propofol (N/A, 08/16/2015); polypectomy (08/16/2015); Laparoscopic partial colectomy (N/A, 09/11/2015); Cystoscopy with stent placement (Bilateral, 09/11/2015); Bladder repair (N/A, 09/11/2015); Ileostomy closure (N/A, 11/19/2015); Colon surgery; Anterior cervical decompression/discectomy fusion 4 level (N/A, 02/15/2019); and Colonoscopy (N/A, 02/14/2021). Peter [name] has a current medication list which includes the following prescription(s): ciclopirox, fluticasone-salmeterol, hydroxychloroquine, taltz, taltz, taltz, omeprazole, and ondansetron, and the following Facility-Administered Medications: pentafluoroprop-tetrafluoroeth. His primarily concern today is the Back Pain (lower)  Initial Vital Signs:  Pulse/HCG Rate: 85  Temp: (!) 97.3 F (36.3 C) Resp: 16 BP: 129/81 SpO2: 95 %  BMI: Estimated body mass index is 31.01 kg/m as calculated from the following:   Height as of this encounter: 5\' 9"  (1.753 m).   Weight as of this encounter: 210 lb (95.3 kg).  Risk Assessment: Allergies: Reviewed. He has No Known Allergies.  Allergy Precautions: None required Coagulopathies: Reviewed. None identified.  Blood-thinner therapy: None at this time Active Infection(s): Reviewed. None identified. Peter [name] is afebrile  Site Confirmation: Peter [name] was asked to confirm the procedure and laterality before marking the site Procedure checklist: Completed Consent: Before the procedure and under the influence of no sedative(s), amnesic(s), or anxiolytics, the patient was informed of the treatment options, risks and possible complications. To fulfill our ethical and legal obligations, as recommended by the American Medical Association's Code of Ethics, I have informed the patient of my clinical impression; the nature and purpose of the treatment or procedure; the risks, benefits,  and possible complications of the intervention; the alternatives, including doing nothing; the risk(s) and benefit(s) of the alternative treatment(s) or procedure(s); and the risk(s) and benefit(s) of doing nothing. The patient  was provided information about the general risks and possible complications associated with the procedure. These may include, but are not limited to: failure to achieve desired goals, infection, bleeding, organ or nerve damage, allergic reactions, paralysis, and death. In addition, the patient was informed of those risks and complications associated to Spine-related procedures, such as failure to decrease pain; infection (i.e.: Meningitis, epidural or intraspinal abscess); bleeding (i.e.: epidural hematoma, subarachnoid hemorrhage, or any other type of intraspinal or peri-dural bleeding); organ or nerve damage (i.e.: Any type of peripheral nerve, nerve root, or spinal cord injury) with subsequent damage to sensory, motor, and/or autonomic systems, resulting in permanent pain, numbness, and/or weakness of one or several areas of the body; allergic reactions; (i.e.: anaphylactic reaction); and/or death. Furthermore, the patient was informed of those risks and complications associated with the medications. These include, but are not limited to: allergic reactions (i.e.: anaphylactic or anaphylactoid reaction(s)); adrenal axis suppression; blood sugar elevation that in diabetics may result in ketoacidosis or comma; water retention that in patients with history of congestive heart failure may result in shortness of breath, pulmonary edema, and decompensation with resultant heart failure; weight gain; swelling or edema; medication-induced neural toxicity; particulate matter embolism and blood vessel occlusion with resultant organ, and/or nervous system infarction; and/or aseptic necrosis of one or more joints. Finally, the patient was informed that Medicine is not an exact science; therefore,  there is also the possibility of unforeseen or unpredictable risks and/or possible complications that may result in a catastrophic outcome. The patient indicated having understood very clearly. We have given the patient no guarantees and we have made no promises. Enough time was given to the patient to ask questions, all of which were answered to the patient's satisfaction. Mr. Coker has indicated that he wanted to continue with the procedure. Attestation: I, the ordering provider, attest that I have discussed with the patient the benefits, risks, side-effects, alternatives, likelihood of achieving goals, and potential problems during recovery for the procedure that I have provided informed consent. Date  Time: 04/01/2021  9:56 AM  Pre-Procedure Preparation:  Monitoring: As per clinic protocol. Respiration, ETCO2, SpO2, BP, heart rate and rhythm monitor placed and checked for adequate function Safety Precautions: Patient was assessed for positional comfort and pressure points before starting the procedure. Time-out: I initiated and conducted the "Time-out" before starting the procedure, as per protocol. The patient was asked to participate by confirming the accuracy of the "Time Out" information. Verification of the correct person, site, and procedure were performed and confirmed by me, the nursing staff, and the patient. "Time-out" conducted as per Joint Commission's Universal Protocol (UP.01.01.01). Time: 1038  Description of Procedure:          Laterality: Bilateral. The procedure was performed in identical fashion on both sides. Levels:  L2, L3, L4, L5, & S1 Medial Branch Level(s) Area Prepped: Posterior Lumbosacral Region DuraPrep (Iodine Povacrylex [0.7% available iodine] and Isopropyl Alcohol, 74% w/w) Safety Precautions: Aspiration looking for blood return was conducted prior to all injections. At no point did we inject any substances, as a needle was being advanced. Before injecting, the  patient was told to immediately notify me if he was experiencing any new onset of "ringing in the ears, or metallic taste in the mouth". No attempts were made at seeking any paresthesias. Safe injection practices and needle disposal techniques used. Medications properly checked for expiration dates. SDV (single dose vial) medications used. After the completion of the procedure, all disposable equipment used was  discarded in the proper designated medical waste containers. Local Anesthesia: Protocol guidelines were followed. The patient was positioned over the fluoroscopy table. The area was prepped in the usual manner. The time-out was completed. The target area was identified using fluoroscopy. A 12-in long, straight, sterile hemostat was used with fluoroscopic guidance to locate the targets for each level blocked. Once located, the skin was marked with an approved surgical skin marker. Once all sites were marked, the skin (epidermis, dermis, and hypodermis), as well as deeper tissues (fat, connective tissue and muscle) were infiltrated with a small amount of a short-acting local anesthetic, loaded on a 10cc syringe with a 25G, 1.5-in  Needle. An appropriate amount of time was allowed for local anesthetics to take effect before proceeding to the next step. Local Anesthetic: Lidocaine 2.0% The unused portion of the local anesthetic was discarded in the proper designated containers. Technical explanation of process:  L2 Medial Branch Nerve Block (MBB): The target area for the L2 medial branch is at the junction of the postero-lateral aspect of the superior articular process and the superior, posterior, and medial edge of the transverse process of L3. Under fluoroscopic guidance, a Quincke needle was inserted until contact was made with os over the superior postero-lateral aspect of the pedicular shadow (target area). After negative aspiration for blood, 0.5 mL of the nerve block solution was injected without  difficulty or complication. The needle was removed intact. L3 Medial Branch Nerve Block (MBB): The target area for the L3 medial branch is at the junction of the postero-lateral aspect of the superior articular process and the superior, posterior, and medial edge of the transverse process of L4. Under fluoroscopic guidance, a Quincke needle was inserted until contact was made with os over the superior postero-lateral aspect of the pedicular shadow (target area). After negative aspiration for blood, 0.5 mL of the nerve block solution was injected without difficulty or complication. The needle was removed intact. L4 Medial Branch Nerve Block (MBB): The target area for the L4 medial branch is at the junction of the postero-lateral aspect of the superior articular process and the superior, posterior, and medial edge of the transverse process of L5. Under fluoroscopic guidance, a Quincke needle was inserted until contact was made with os over the superior postero-lateral aspect of the pedicular shadow (target area). After negative aspiration for blood, 0.5 mL of the nerve block solution was injected without difficulty or complication. The needle was removed intact. L5 Medial Branch Nerve Block (MBB): The target area for the L5 medial branch is at the junction of the postero-lateral aspect of the superior articular process and the superior, posterior, and medial edge of the sacral ala. Under fluoroscopic guidance, a Quincke needle was inserted until contact was made with os over the superior postero-lateral aspect of the pedicular shadow (target area). After negative aspiration for blood, 0.5 mL of the nerve block solution was injected without difficulty or complication. The needle was removed intact. S1 Medial Branch Nerve Block (MBB): The target area for the S1 medial branch is at the posterior and inferior 6 o'clock position of the L5-S1 facet joint. Under fluoroscopic guidance, the Quincke needle inserted for the  L5 MBB was redirected until contact was made with os over the inferior and postero aspect of the sacrum, at the 6 o' clock position under the L5-S1 facet joint (Target area). After negative aspiration for blood, 0.5 mL of the nerve block solution was injected without difficulty or complication. The needle was removed  intact.  Nerve block solution: 0.2% PF-Ropivacaine + Triamcinolone (40 mg/mL) diluted to a final concentration of 4 mg of Triamcinolone/mL of Ropivacaine The unused portion of the solution was discarded in the proper designated containers. Procedural Needles: 22-gauge, 3.5-inch, Quincke needles used for all levels.  Once the entire procedure was completed, the treated area was cleaned, making sure to leave some of the prepping solution back to take advantage of its long term bactericidal properties.      Illustration of the posterior view of the lumbar spine and the posterior neural structures. Laminae of L2 through S1 are labeled. DPRL5, dorsal primary ramus of L5; DPRS1, dorsal primary ramus of S1; DPR3, dorsal primary ramus of L3; FJ, facet (zygapophyseal) joint L3-L4; I, inferior articular process of L4; LB1, lateral branch of dorsal primary ramus of L1; IAB, inferior articular branches from L3 medial branch (supplies L4-L5 facet joint); IBP, intermediate branch plexus; MB3, medial branch of dorsal primary ramus of L3; NR3, third lumbar nerve root; S, superior articular process of L5; SAB, superior articular branches from L4 (supplies L4-5 facet joint also); TP3, transverse process of L3.  Vitals:   04/01/21 1045 04/01/21 1048 04/01/21 1053 04/01/21 1102  BP: (!) 132/96 (!) 135/92 130/85 132/84  Pulse: 87 88 80 78  Resp: 16 16 16 16   Temp:      TempSrc:      SpO2: 98% 97% 100% 98%  Weight:      Height:         Start Time: 1038 hrs. End Time: 1048 hrs.  Imaging Guidance (Spinal):          Type of Imaging Technique: Fluoroscopy Guidance (Spinal) Indication(s): Assistance  in needle guidance and placement for procedures requiring needle placement in or near specific anatomical locations not easily accessible without such assistance. Exposure Time: Please see nurses notes. Contrast: None used. Fluoroscopic Guidance: I was personally present during the use of fluoroscopy. "Tunnel Vision Technique" used to obtain the best possible view of the target area. Parallax error corrected before commencing the procedure. "Direction-depth-direction" technique used to introduce the needle under continuous pulsed fluoroscopy. Once target was reached, antero-posterior, oblique, and lateral fluoroscopic projection used confirm needle placement in all planes. Images permanently stored in EMR. Interpretation: No contrast injected. I personally interpreted the imaging intraoperatively. Adequate needle placement confirmed in multiple planes. Permanent images saved into the patient's record.  Antibiotic Prophylaxis:   Anti-infectives (From admission, onward)    None      Indication(s): None identified  Post-operative Assessment:  Post-procedure Vital Signs:  Pulse/HCG Rate: 78  Temp: (!) 97.3 F (36.3 C) Resp: 16 BP: 132/84 SpO2: 98 %  EBL: None  Complications: No immediate post-treatment complications observed by team, or reported by patient.  Note: The patient tolerated the entire procedure well. A repeat set of vitals were taken after the procedure and the patient was kept under observation following institutional policy, for this type of procedure. Post-procedural neurological assessment was performed, showing return to baseline, prior to discharge. The patient was provided with post-procedure discharge instructions, including a section on how to identify potential problems. Should any problems arise concerning this procedure, the patient was given instructions to immediately contact , at any time, without hesitation. In any case, we plan to contact the patient by  telephone for a follow-up status report regarding this interventional procedure.  Comments:  No additional relevant information.  Plan of Care  Orders:  Orders Placed This Encounter  Procedures   LUMBAR  FACET(MEDIAL BRANCH NERVE BLOCK) MBNB    Scheduling Instructions:     Procedure: Lumbar facet block (AKA.: Lumbosacral medial branch nerve block)     Side: Bilateral     Level: L3-4, L4-5, & L5-S1 Facets (L2, L3, L4, L5, & S1 Medial Branch Nerves)     Sedation: Patient's choice.     Timeframe: Today    Order Specific Question:   Where will this procedure be performed?    Answer:   [facility]   DG [facility] C-ARM 1-60 MIN NO REPORT    Intraoperative interpretation by procedural physician at [facility].    Standing Status:   Standing    Number of Occurrences:   1    Order Specific Question:   Reason for exam:    Answer:   Assistance in needle guidance and placement for procedures requiring needle placement in or near specific anatomical locations not easily accessible without such assistance.   Informed Consent Details: Physician/Practitioner Attestation; Transcribe to consent form and obtain patient signature    Nursing Order: Transcribe to consent form and obtain patient signature. Note: Always confirm laterality of pain with Mr. Bascom, before procedure.    Order Specific Question:   Physician/Practitioner attestation of informed consent for procedure/surgical case    Answer:   I, the physician/practitioner, attest that I have discussed with the patient the benefits, risks, side effects, alternatives, likelihood of achieving goals and potential problems during recovery for the procedure that I have provided informed consent.    Order Specific Question:   Procedure    Answer:   Lumbar Facet Block  under fluoroscopic guidance    Order Specific Question:   Physician/Practitioner performing the procedure    Answer:   Jasten Guyette A. Laban Emperor MD    Order Specific  Question:   Indication/Reason    Answer:   Low Back Pain, with our without leg pain, due to Facet Joint Arthralgia (Joint Pain) Spondylosis (Arthritis of the Spine), without myelopathy or radiculopathy (Nerve Damage).   Provide equipment / supplies at bedside    "Block Tray" (Disposable  single use) Needle type: SpinalSpinal Amount/quantity: 4 Size: Regular (3.5-inch) Gauge: 22G    Standing Status:   Standing    Number of Occurrences:   1    Order Specific Question:   Specify    Answer:   Block Tray    Chronic Opioid Analgesic:  Hydrocodone/APAP 5/325 1 tab p.o. twice daily (10/01/2020). NO OPIOIDS - Abnormal UDS (11/04/2020): Positive for marijuana MME/day: 10 mg/day   Medications ordered for procedure: Meds ordered this encounter  Medications   lidocaine (XYLOCAINE) 2 % (with pres) injection 400 mg   pentafluoroprop-tetrafluoroeth (GEBAUERS) aerosol   lactated ringers infusion 1,000 mL   midazolam (VERSED) 5 MG/5ML injection 0.5-2 mg    Make sure Flumazenil is available in the pyxis when using this medication. If oversedation occurs, administer 0.2 mg IV over 15 sec. If after 45 sec no response, administer 0.2 mg again over 1 min; may repeat at 1 min intervals; not to exceed 4 doses (1 mg)   ropivacaine (PF) 2 mg/mL (0.2%) (NAROPIN) injection 18 mL   triamcinolone acetonide (KENALOG-40) injection 80 mg    Medications administered: We administered lidocaine, lactated ringers, midazolam, ropivacaine (PF) 2 mg/mL (0.2%), and triamcinolone acetonide.  See the medical record for exact dosing, route, and time of administration.  Follow-up plan:   Return in about 2 weeks (around 04/15/2021) for Proc-day (T,Th), (VV), (PPE).  Interventional Therapies  Risk  Complexity Considerations:   Estimated body mass index is 31.45 kg/m as calculated from the following:   Height as of this encounter: 5\' 9"  (1.753 m).   Weight as of this encounter: 213 lb (96.6 kg). NO OPIOIDS!!! -  Abnormal UDS (11/04/2020): Positive for marijuana   Planned  Pending:   Pending further evaluation   Under consideration:   Diagnostic bilateral lumbar facet block #1    Completed:   None at this time   Therapeutic  Palliative (PRN) options:   None established       Recent Visits Date Type Provider Dept  03/20/21 Telemedicine 03/22/21, MD [facility]  02/13/21 Procedure visit 04/15/21, MD [facility]  02/03/21 Office Visit 04/05/21, MD [facility]  Showing recent visits within past 90 days and meeting all other requirements Today's Visits Date Type Provider Dept  04/01/21 Procedure visit 04/03/21, MD [facility]  Showing today's visits and meeting all other requirements Future Appointments Date Type Provider Dept  04/15/21 Appointment 06/15/21, MD [facility]  Showing future appointments within next 90 days and meeting all other requirements Disposition: Discharge home  Discharge (Date  Time): 04/01/2021; 1104 hrs.   Primary Care Physician: [facility] Location: [facility] Note by: [facility], MD Date: 04/01/2021; Time: 11:42 AM  Disclaimer:  Medicine is not an 04/03/2021. The only guarantee in medicine is that nothing is guaranteed. It is important to note that the decision to proceed with this intervention was based on the information collected from the patient. The Data and conclusions were drawn from the patient's questionnaire, the interview, and the physical examination. Because the information was provided in large part by the patient, it cannot be guaranteed that it has not been purposely or unconsciously manipulated. Every effort has been made to obtain as much relevant data as possible for this evaluation. It is important to note that the conclusions that lead to this procedure are derived in large part from the  available data. Always take into account that the treatment will also be dependent on availability of resources and existing treatment guidelines, considered by other Pain Management Practitioners as being common knowledge and practice, at the time of the intervention. For Medico-Legal purposes, it is also important to point out that variation in procedural techniques and pharmacological choices are the acceptable norm. The indications, contraindications, technique, and results of the above procedure should only be interpreted and judged by a Board-Certified Interventional Pain Specialist with extensive familiarity and expertise in the same exact procedure and technique.

## 2021-04-01 ENCOUNTER — Ambulatory Visit (HOSPITAL_BASED_OUTPATIENT_CLINIC_OR_DEPARTMENT_OTHER): Payer: BC Managed Care – PPO | Admitting: Pain Medicine

## 2021-04-01 ENCOUNTER — Ambulatory Visit
Admission: RE | Admit: 2021-04-01 | Discharge: 2021-04-01 | Disposition: A | Discharge status: Home / Self Care | Payer: BC Managed Care – PPO | Source: Ambulatory Visit | Attending: Pain Medicine | Admitting: Pain Medicine

## 2021-04-01 ENCOUNTER — Other Ambulatory Visit: Payer: Self-pay

## 2021-04-01 ENCOUNTER — Encounter: Payer: Self-pay | Admitting: Pain Medicine

## 2021-04-01 VITALS — BP 132/84 | HR 78 | Temp 97.3°F | Resp 16 | Ht 69.0 in | Wt 210.0 lb

## 2021-04-01 DIAGNOSIS — G8929 Other chronic pain: Secondary | ICD-10-CM | POA: Diagnosis not present

## 2021-04-01 DIAGNOSIS — M545 Low back pain, unspecified: Secondary | ICD-10-CM | POA: Insufficient documentation

## 2021-04-01 DIAGNOSIS — M47817 Spondylosis without myelopathy or radiculopathy, lumbosacral region: Secondary | ICD-10-CM

## 2021-04-01 DIAGNOSIS — R937 Abnormal findings on diagnostic imaging of other parts of musculoskeletal system: Secondary | ICD-10-CM | POA: Diagnosis not present

## 2021-04-01 DIAGNOSIS — M47816 Spondylosis without myelopathy or radiculopathy, lumbar region: Secondary | ICD-10-CM | POA: Diagnosis not present

## 2021-04-01 DIAGNOSIS — M5137 Other intervertebral disc degeneration, lumbosacral region: Secondary | ICD-10-CM | POA: Insufficient documentation

## 2021-04-01 MED ORDER — PENTAFLUOROPROP-TETRAFLUOROETH EX AERO
INHALATION_SPRAY | Freq: Once | CUTANEOUS | Status: DC
  Filled 2021-04-01: qty 116

## 2021-04-01 MED ORDER — MIDAZOLAM HCL 5 MG/5ML IJ SOLN
INTRAMUSCULAR | Status: AC
  Filled 2021-04-01: qty 5

## 2021-04-01 MED ORDER — LIDOCAINE HCL 2 % IJ SOLN
20.0000 mL | Freq: Once | INTRAMUSCULAR | Status: AC
  Administered 2021-04-01: 400 mg

## 2021-04-01 MED ORDER — TRIAMCINOLONE ACETONIDE 40 MG/ML IJ SUSP
INTRAMUSCULAR | Status: AC
  Filled 2021-04-01: qty 2

## 2021-04-01 MED ORDER — MIDAZOLAM HCL 5 MG/5ML IJ SOLN
0.5000 mg | Freq: Once | INTRAMUSCULAR | Status: AC
  Administered 2021-04-01: 2 mg via INTRAVENOUS

## 2021-04-01 MED ORDER — TRIAMCINOLONE ACETONIDE 40 MG/ML IJ SUSP
80.0000 mg | Freq: Once | INTRAMUSCULAR | Status: AC
  Administered 2021-04-01: 40 mg

## 2021-04-01 MED ORDER — LIDOCAINE HCL 2 % IJ SOLN
INTRAMUSCULAR | Status: AC
  Filled 2021-04-01: qty 20

## 2021-04-01 MED ORDER — ROPIVACAINE HCL 2 MG/ML IJ SOLN
INTRAMUSCULAR | Status: AC
  Filled 2021-04-01: qty 20

## 2021-04-01 MED ORDER — ROPIVACAINE HCL 2 MG/ML IJ SOLN
18.0000 mL | Freq: Once | INTRAMUSCULAR | Status: AC
  Administered 2021-04-01: 20 mL via PERINEURAL

## 2021-04-01 MED ORDER — LACTATED RINGERS IV SOLN
1000.0000 mL | Freq: Once | INTRAVENOUS | Status: AC
  Administered 2021-04-01: 1000 mL via INTRAVENOUS

## 2021-04-01 NOTE — Progress Notes (Signed)
Safety precautions to be maintained throughout the outpatient stay will include: orient to surroundings, keep bed in low position, maintain call bell within reach at all times, provide assistance with transfer out of bed and ambulation.  

## 2021-04-01 NOTE — Patient Instructions (Signed)

## 2021-04-02 ENCOUNTER — Telehealth: Payer: Self-pay

## 2021-04-02 NOTE — Telephone Encounter (Signed)
Post procedure phone call. Patient states he is doing well.  

## 2021-04-14 NOTE — Progress Notes (Signed)
Patient: Peter [name]  Service Category: E/Peter  Provider: Gaspar Cola, MD  DOB: Dec 15, 1965  DOS: 04/15/2021  Location: Office  MRN: [id number]  Setting: Ambulatory outpatient  Referring Provider: [place]*  Type: Established Patient  Specialty: Interventional Pain Management  PCP: [facility]  Location: Remote location  Delivery: TeleHealth     [name] Encounter - Pain Management PROVIDER NOTE: Information contained herein reflects review and annotations entered in association with encounter. Interpretation of such information and data should be left to medically-trained personnel. Information provided to patient can be located elsewhere in the medical record under "Patient Instructions". Document created using STT-dictation technology, any transcriptional errors that may result from process are unintentional.    Contact & Pharmacy Preferred: [phone] Home: [phone] (home) Mobile: [phone] (mobile) E-mail: No e-mail address on record  [id number] [address]Phone: [phone]: [address] [place] [address]Phone: [phone]: [phone]  Pre-screening  Peter [name] offered "in-person" vs "[name]" encounter. He indicated preferring [name] for this encounter.   Reason COVID-19*  Social distancing based on CDC and AMA recommendations.   I contacted Peter [name] 04/15/2021 via telephone.      I clearly identified myself as FGaspar Cola MD. I verified that I was speaking with the correct person using two identifiers (Name: Peter [name] and date of birth: [birth year].  Consent I sought verbal advanced consent from Peter [name] visit interactions. I informed Peter [name] of possible security and privacy concerns, risks, and limitations  associated with providing "not-in-person" medical evaluation and management services. I also informed Mr. Fargo of the availability of "in-person" appointments. Finally, I informed him that there would be a charge for the [name] visit and that he could be  personally, fully or partially, financially responsible for it. Mr. PPettexpressed understanding and agreed to proceed.   Historic Elements   [name] a 55y.o. year old, male patient evaluated today after our last contact on 04/01/2021. Peter [name] has a past medical history of Arthritis, COPD (chronic obstructive pulmonary disease) [place], Diverticulitis, Generalized headaches, GERD (gastroesophageal reflux disease), History of colonoscopy with polypectomy (09/08/2018), Lower back pain, Rectal pain, and Thrombosed hemorrhoids. He also  has a past surgical history that includes Cholecystectomy (1989); Tonsillectomy (1970); Appendectomy (1988); Colonoscopy with propofol (N/A, 08/16/2015); polypectomy (08/16/2015); Laparoscopic partial colectomy (N/A, 09/11/2015); Cystoscopy with stent placement (Bilateral, 09/11/2015); Bladder repair (N/A, 09/11/2015); Ileostomy closure (N/A, 11/19/2015); Colon surgery; Anterior cervical decompression/discectomy fusion 4 level (N/A, 02/15/2019); and Colonoscopy (N/A, 02/14/2021). Mr. PLaymonhas a current medication list which includes the following prescription(s): ciclopirox, fluticasone-salmeterol, hydroxychloroquine, and omeprazole. He  reports that he has been smoking cigarettes. He has a 42.00 pack-year smoking history. He has never used smokeless tobacco. He reports that he does not drink alcohol and does not use drugs. Mr. PKeeseyhas No Known Allergies.   HPI  Today, he is being contacted for a post-procedure assessment.  According to the patient, he again attained 100% relief of the pain for the duration of the local anesthetic.  Unfortunately, once that local anesthetic wore off then the pain started coming  back.  He currently has an ongoing 40% relief, but he is interested in the option of radiofrequency ablation.  His pain  is worse on the right side and therefore he would like to get started on that side.  Today I took the time to explain to him about the procedure and how it deferred from the diagnostic blocks.  He understood and accepted and he indicated being interested in proceeding with the next step.  Statement of Medical Necessity:  Peter [name] to experienced debilitating chronic nerve-associated pain from the Lumbosacral Facet Syndrome (Spondylosis without myelopathy or radiculopathy, lumbosacral region [M47.817]).  Duration: This pain has persisted for longer than three months.  Non-surgical care: The patient has either failed to respond, or was unable to tolerate, or simply did not get enough benefit from other more conservative therapies including, but not limited to: 1. Over-the-counter oral analgesic medications (i.e.: ibuprofen, naproxen, etc.) 2. Anti-inflammatory medications 3. Muscle relaxants 4. Membrane stabilizers 5. Opioids 6. Physical therapy (PT), chiropractic manipulation, and/or home exercise program (HEP). 7. Modalities (Heat, ice, etc.)  Invasive therapies: Nerve blocks have failed to provide any significant long-term benefit.  Surgical care: Not indicated.  Physical exam: Has been consistent with Lumbosacral Facet Syndrome.  Diagnostic imaging: Lumbosacral Facet Hypertrophy.                Diagnostic interventional therapies: Peter [name] has attained greater than 50% reduction in pain from at least two (2) diagnostic medial branch blocks conducted in separate occasions.   For the above listed reason, I believe, as the examining and treating physician, that it is medically necessary to proceed with Non-Pulsed Radiofrequency Ablation for the purpose of attempting to prolong the duration of the benefits seen with the diagnostic injections.  At this point,  after having personally evaluated this patient and being the treating attending physician, it is my professional opinion, as a board-certified pain management specialist, that it is medically necessary for this patient to undergo the treatments that I am ordering.  Any attempt at denying these treatments, by a health care insurance entity, represents a case of "practicing medicine without a license", which is currently illegal in the state of [place].  Furthermore, if this company attempts to use a Mudlogger" to deny such treatment without having performed a face-to-face evaluation, represents an act of gross negligence and malpractice.  Post-Procedure Evaluation  Procedure (04/01/2021):  Procedure:           Anesthesia, Analgesia, Anxiolysis:  Type: Lumbar Facet, Medial Branch Block(s) #2  Primary Purpose: Diagnostic Region: Posterolateral Lumbosacral Spine Level: L2, L3, L4, L5, & S1 Medial Branch Level(s). Injecting these levels blocks the L3-4, L4-5, and L5-S1 lumbar facet joints. Laterality: Bilateral   Type: Local Anesthesia Local Anesthetic: Lidocaine 1-2% Sedation: Minimal Anxiolysis  Indication(s): Anxiety & Analgesia Route: Infiltration (Collins/IM) IV Access: Available     Position: Prone    Indications: 1. Lumbosacral facet joint syndrome   2. Lumbar facet hypertrophy   3. Spondylosis without myelopathy or radiculopathy, lumbosacral region   4. DDD (degenerative disc disease), lumbosacral   5. Chronic low back pain (1ry area of Pain) (Bilateral) (R>L) w/o sciatica   6. Abnormal MRI, lumbar spine (07/26/2020)     Pain Score: Pre-procedure: 8 /10 Post-procedure: 5 /10     Anxiolysis: Please see nurses note.  Effectiveness during initial hour after procedure (Ultra-Short Term Relief): 100 %.  Local anesthetic used: Long-acting (4-6 hours) Effectiveness: Defined as any analgesic benefit obtained secondary to the administration of local anesthetics. This carries  significant diagnostic value as to the etiological location, or anatomical origin, of the pain.  Duration of benefit is expected to coincide with the duration of the local anesthetic used.  Effectiveness during initial 4-6 hours after procedure (Short-Term Relief): 100 %.  Long-term benefit: Defined as any relief past the pharmacologic duration of the local anesthetics.  Effectiveness past the initial 6 hours after procedure (Long-Term Relief): 40 % (back is somewhat better but continues to have an achy pain.).  Benefits, current: Defined as benefit present at the time of this evaluation.   Analgesia: The patient indicates currently having an ongoing 40% relief of the pain but for the duration of the local anesthetic he was getting 100% relief. Function: Somewhat improved ROM: Somewhat improved   Pharmacotherapy Assessment   Analgesic: Hydrocodone/APAP 5/325 1 tab p.o. twice daily (10/01/2020). NO OPIOIDS - Abnormal UDS (11/04/2020): Positive for marijuana MME/day: 10 mg/day   Monitoring: [name] PMP: PDMP reviewed during this encounter.       Pharmacotherapy: No side-effects or adverse reactions reported. Compliance: No problems identified. Effectiveness: Clinically acceptable. Plan: Refer to "POC". UDS:  Summary  Date Value Ref Range Status  11/04/2020 Note  Final    Comment:    ==================================================================== Compliance Drug Analysis, Ur ==================================================================== Test                             Result       Flag       Units  Drug Present not Declared for Prescription Verification   Carboxy-THC                    90           UNEXPECTED ng/mg creat    Carboxy-THC is a metabolite of tetrahydrocannabinol (THC). Source of    THC is most commonly herbal marijuana or marijuana-based products,    but THC is also present in a scheduled prescription medication.    Trace amounts of THC can be present in hemp and  cannabidiol (CBD)    products. This test is not intended to distinguish between delta-9-    tetrahydrocannabinol, the predominant form of THC in most herbal or    marijuana-based products, and delta-8-tetrahydrocannabinol.    Gabapentin                     PRESENT      UNEXPECTED   Salicylate                     PRESENT      UNEXPECTED ==================================================================== Test                      Result    Flag   Units      Ref Range   Creatinine              84               mg/dL      >=20 ==================================================================== Declared Medications:  The flagging and interpretation on this report are based on the  following declared medications.  Unexpected results may arise from  inaccuracies in the declared medications.   **Note: The testing scope of this panel does not include the  following reported medications:   Ciclopirox (Loprox)  Fluticasone (Advair)  Hydroxychloroquine (Plaquenil)  Omeprazole (Prilosec)  Salmeterol (Advair) ==================================================================== For clinical consultation, please call [phone]. ====================================================================      Laboratory Chemistry Profile   Renal Lab Results  Component  Value Date   BUN 17 01/14/2021   CREATININE 0.99 01/14/2021   BCR 17 01/14/2021   GFRAA 92 02/02/2020   GFRNONAA >60 11/15/2020    Hepatic Lab Results  Component Value Date   AST 23 01/14/2021   ALT 12 01/14/2021   ALBUMIN 4.5 01/14/2021   ALKPHOS 53 01/14/2021   LIPASE 34 12/31/2014    Electrolytes Lab Results  Component Value Date   NA 141 01/14/2021   K 4.7 01/14/2021   CL 102 01/14/2021   CALCIUM 9.5 01/14/2021   MG 2.1 11/04/2020   PHOS 4.2 09/22/2015    Bone Lab Results  Component Value Date   25OHVITD1 22 (L) 11/04/2020   25OHVITD2 <1.0 11/04/2020   25OHVITD3 22 11/04/2020    Inflammation (CRP:  Acute Phase) (ESR: Chronic Phase) Lab Results  Component Value Date   CRP <1 11/04/2020   ESRSEDRATE 36 (H) 11/04/2020         Note: Above Lab results reviewed.  Imaging  DG PAIN CLINIC C-ARM 1-60 MIN NO REPORT Fluoro was used, but no Radiologist interpretation will be provided.  Please refer to "NOTES" tab for provider progress note.  Assessment  The primary encounter diagnosis was Chronic low back pain (1ry area of Pain) (Bilateral) (R>L) w/o sciatica. Diagnoses of Lumbosacral facet joint syndrome, Lumbar facet hypertrophy, Spondylosis without myelopathy or radiculopathy, lumbosacral region, DDD (degenerative disc disease), lumbosacral, and Anterolisthesis of lumbosacral spine (L5/S1) were also pertinent to this visit.  Plan of Care  Problem-specific:  No problem-specific Assessment & Plan notes found for this encounter.  Peter [name] has a current medication list which includes the following long-term medication(s): fluticasone-salmeterol and omeprazole.  Pharmacotherapy (Medications Ordered): No orders of the defined types were placed in this encounter.  Orders:  Orders Placed This Encounter  Procedures   Radiofrequency,Lumbar    Standing Status:   Future    Standing Expiration Date:   07/15/2021    Scheduling Instructions:     Side(s): Right-sided     Level: L3-4, L4-5, & L5-S1 Facets (L2, L3, L4, L5, & S1 Medial Branch Nerves)     Sedation: Patient's choice.     Scheduling Timeframe: As soon as pre-approved    Order Specific Question:   Where will this procedure be performed?    Answer:   [facility]    Follow-up plan:   Return for (68mn), Proc-day (T,Th), (Clinic) procedure: (R) L-FCT RFA #1, (Sed-anx).     Interventional Therapies  Risk  Complexity Considerations:   Estimated body mass index is 31.45 kg/Peter as calculated from the following:   Height as of this encounter: '5\' 9"'  (1.753 Peter).   Weight as of this encounter: 213 lb (96.6 kg). NO  OPIOIDS!!! - Abnormal UDS (11/04/2020): Positive for marijuana   Planned  Pending:   Pending further evaluation   Under consideration:   Diagnostic bilateral lumbar facet block #1    Completed:   None at this time   Therapeutic  Palliative (PRN) options:   None established        Recent Visits Date Type Provider Dept  04/01/21 Procedure visit NMilinda Pointer MD [facility]  03/20/21 Telemedicine NMilinda Pointer MD [facility]  02/13/21 Procedure visit NMilinda Pointer MD [facility]  02/03/21 Office Visit NMilinda Pointer MD [facility]  Showing recent visits within past 90 days and meeting all other requirements Today's Visits Date Type Provider Dept  04/15/21 Office Visit NMilinda Pointer MD  [facility]  Showing today's visits and meeting all other requirements Future Appointments No visits were found meeting these conditions. Showing future appointments within next 90 days and meeting all other requirements I discussed the assessment and treatment plan with the patient. The patient was provided an opportunity to ask questions and all were answered. The patient agreed with the plan and demonstrated an understanding of the instructions.  Patient advised to call back or seek an in-person evaluation if the symptoms or condition worsens.  Duration of encounter: 15 minutes.  Note by: Gaspar Cola, MD Date: 04/15/2021; Time: 6:00 PM

## 2021-04-15 ENCOUNTER — Ambulatory Visit: Payer: BC Managed Care – PPO | Attending: Pain Medicine | Admitting: Pain Medicine

## 2021-04-15 ENCOUNTER — Other Ambulatory Visit: Payer: Self-pay

## 2021-04-15 DIAGNOSIS — M47816 Spondylosis without myelopathy or radiculopathy, lumbar region: Secondary | ICD-10-CM

## 2021-04-15 DIAGNOSIS — M47817 Spondylosis without myelopathy or radiculopathy, lumbosacral region: Secondary | ICD-10-CM

## 2021-04-15 DIAGNOSIS — M545 Low back pain, unspecified: Secondary | ICD-10-CM | POA: Diagnosis not present

## 2021-04-15 DIAGNOSIS — M4317 Spondylolisthesis, lumbosacral region: Secondary | ICD-10-CM

## 2021-04-15 DIAGNOSIS — M5137 Other intervertebral disc degeneration, lumbosacral region: Secondary | ICD-10-CM | POA: Diagnosis not present

## 2021-04-15 DIAGNOSIS — G8929 Other chronic pain: Secondary | ICD-10-CM

## 2021-04-23 ENCOUNTER — Telehealth: Payer: Self-pay

## 2021-04-23 NOTE — Telephone Encounter (Signed)
He needs a valium called out for his RFA on 05/01/21 . He prefers the pill versus the IV.

## 2021-04-23 NOTE — Telephone Encounter (Signed)
Called patient and explained to him that we would need a consent signed prior to the procedure so that we could call in the valium. Patient states that he will just do the Versed again because he will not be able to come by prior to sign the consent.

## 2021-04-24 ENCOUNTER — Other Ambulatory Visit: Payer: Self-pay | Admitting: Dermatology

## 2021-04-24 ENCOUNTER — Other Ambulatory Visit: Payer: Self-pay

## 2021-04-24 DIAGNOSIS — B351 Tinea unguium: Secondary | ICD-10-CM

## 2021-04-24 DIAGNOSIS — B352 Tinea manuum: Secondary | ICD-10-CM

## 2021-04-24 MED ORDER — CICLOPIROX OLAMINE 0.77 % EX CREA: TOPICAL_CREAM | CUTANEOUS | 0 refills | Status: DC

## 2021-05-01 ENCOUNTER — Ambulatory Visit: Payer: BC Managed Care – PPO | Admitting: Pain Medicine

## 2021-05-06 ENCOUNTER — Telehealth: Payer: Self-pay

## 2021-05-06 ENCOUNTER — Other Ambulatory Visit: Payer: Self-pay

## 2021-05-06 ENCOUNTER — Telehealth: Payer: Self-pay | Admitting: *Deleted

## 2021-05-06 ENCOUNTER — Ambulatory Visit: Payer: BC Managed Care – PPO | Admitting: Dermatology

## 2021-05-06 DIAGNOSIS — L409 Psoriasis, unspecified: Secondary | ICD-10-CM

## 2021-05-06 DIAGNOSIS — B351 Tinea unguium: Secondary | ICD-10-CM | POA: Diagnosis not present

## 2021-05-06 DIAGNOSIS — B352 Tinea manuum: Secondary | ICD-10-CM

## 2021-05-06 DIAGNOSIS — L4050 Arthropathic psoriasis, unspecified: Secondary | ICD-10-CM | POA: Diagnosis not present

## 2021-05-06 MED ORDER — CICLOPIROX OLAMINE 0.77 % EX CREA
TOPICAL_CREAM | CUTANEOUS | 3 refills | Status: DC
Start: 2021-05-06 — End: 2022-11-26

## 2021-05-06 NOTE — Telephone Encounter (Signed)
Pt said he needs to sign some kind release form to be able to receive Rx for volume  for his procedure on 05/22/21.

## 2021-05-06 NOTE — Telephone Encounter (Signed)
Patient returned phone call, he is needing to sign consent for right Lumbar facet RFA.  After consent we will que FN to send in valium for procedure.

## 2021-05-06 NOTE — Progress Notes (Signed)
   Follow-Up Visit   Subjective  Peter [name] is a 55 y.o. male who presents for the following: Follow-up (Patient here today for follow up on psoriasis with psoriatic arthritis. Patient reports having to stop taltz injections due to side effects. States he developed itchy, tingling, and burning sensations in his arms. Patient states since stopping taltz he is no longer having any itchy scale at hands. ).  Talz didn't help joints that he could tell.  Fingernails are much improved.   The following portions of the chart were reviewed this encounter and updated as appropriate:      Review of Systems: No other skin or systemic complaints except as noted in HPI or Assessment and Plan.   Objective  Well appearing patient in no apparent distress; mood and affect are within normal limits.  A focused examination was performed including hands. Relevant physical exam findings are noted in the Assessment and Plan.  hands and legs Clear today    hands Mild distal thickening with subungle debris at BL thumbnails, rest of nails are clear.  No scaling on palms/fingers  Assessment & Plan  Psoriasis hands and legs  Improved  Psoriasis is a chronic non-curable, but treatable genetic/hereditary disease that may have other systemic features affecting other organ systems such as joints (Psoriatic Arthritis). It is associated with an increased risk of inflammatory bowel disease, heart disease, non-alcoholic fatty liver disease, and depression.    Patient stopped Taltz towards end of loading dose due to adverse reactions  (itchy, tingling, burning sensations)  Hand and nails look clear, so no treatment at this time.  Pt will contact us if psoriasis flares and will reevaluate at that time  Patient sees rheumatologist for arthritis, Talz did not improve his joint pain      Tinea unguium  Related Medications ciclopirox (LOPROX) 0.77 % cream Apply topically as directed. Qd to bid to hands, feet,  and around nails  Tinea manus hands   With Tinea Unguium s/p oral Lamisil course  Improved    Continue Ciclopirox Cream qD to hands and feet and nails to prevent recurrence.  [facility]   Return if symptoms worsen or fail to improve. I, Asher Muir, CMA, am acting as scribe for Willeen Niece, MD.  Documentation: I have reviewed the above documentation for accuracy and completeness, and I agree with the above.  Willeen Niece MD

## 2021-05-06 NOTE — Patient Instructions (Signed)

## 2021-05-06 NOTE — Telephone Encounter (Signed)
Returned patient phone call to ask what release he is referring to.  Asked him to please return the call.

## 2021-05-20 ENCOUNTER — Ambulatory Visit: Payer: BC Managed Care – PPO | Admitting: Pain Medicine

## 2021-05-20 ENCOUNTER — Other Ambulatory Visit: Payer: Self-pay | Admitting: Pain Medicine

## 2021-05-20 DIAGNOSIS — G8929 Other chronic pain: Secondary | ICD-10-CM

## 2021-05-20 DIAGNOSIS — M47817 Spondylosis without myelopathy or radiculopathy, lumbosacral region: Secondary | ICD-10-CM

## 2021-05-20 DIAGNOSIS — F419 Anxiety disorder, unspecified: Secondary | ICD-10-CM

## 2021-05-20 MED ORDER — DIAZEPAM 10 MG PO TABS: 10.0000 mg | ORAL_TABLET | ORAL | 0 refills | Status: DC

## 2021-05-20 NOTE — Progress Notes (Signed)
Pending right lumbar facet radiofrequency ablation under fluoroscopic guidance.  Anxiolysis provided Valium p.o.Marland Kitchen

## 2021-05-21 NOTE — Progress Notes (Signed)
PROVIDER NOTE: Information contained herein reflects review and annotations entered in association with encounter. Interpretation of such information and data should be left to medically-trained personnel. Information provided to patient can be located elsewhere in the medical record under "Patient Instructions". Document created using STT-dictation technology, any transcriptional errors that may result from process are unintentional.    Patient: Peter [name]  Service Category: Procedure Provider: Oswaldo Done, MD DOB: [birth year] DOS: 05/22/2021 Location: [facility] MRN: [id number] Setting: Ambulatory - outpatient Referring Provider: Delano Metz, MD Type: Established Patient Specialty: Interventional Pain Management PCP: [facility] [place] [place]  Primary Reason for Visit: Interventional Pain Management Treatment. CC: Back Pain (lower)    Procedure:          Anesthesia, Analgesia, Anxiolysis:  Type: Thermal Lumbar Facet, Medial Branch Radiofrequency Ablation/Neurotomy  #1  Primary Purpose: Therapeutic Region: Posterolateral Lumbosacral Spine Level: L2, L3, L4, L5, & S1 Medial Branch Level(s). These levels will denervate the L3-4, L4-5, and the L5-S1 lumbar facet joints. Laterality: Right  Anesthesia: Local (1-2% Lidocaine)  Anxiolysis: Oral Valium (10 mg) Sedation: None  Guidance: Fluoroscopy           Position: Prone   Indications: 1. Lumbosacral facet joint syndrome   2. Lumbar facet hypertrophy   3. Spondylosis without myelopathy or radiculopathy, lumbosacral region   4. DDD (degenerative disc disease), lumbosacral   5. Chronic low back pain (1ry area of Pain) (Bilateral) (R>L) w/o sciatica   6. Anterolisthesis of lumbosacral spine (L5/S1)    Peter [name] has been dealing with the above chronic pain for longer than three months and has either failed to respond, was unable to tolerate, or simply did not get enough benefit from other more  conservative therapies including, but not limited to: 1. Over-the-counter medications 2. Anti-inflammatory medications 3. Muscle relaxants 4. Membrane stabilizers 5. Opioids 6. Physical therapy and/or chiropractic manipulation 7. Modalities (Heat, ice, etc.) 8. Invasive techniques such as nerve blocks. Peter [name] has attained more than 50% relief of the pain from a series of diagnostic injections conducted in separate occasions.  Pain Score: Pre-procedure: 9 /10 Post-procedure: 0-No pain/10    Pre-op H&P Assessment:  Peter [name] is a 55 y.o. (year old), male patient, seen today for interventional treatment. He  has a past surgical history that includes Cholecystectomy (1989); Tonsillectomy (1970); Appendectomy (1988); Colonoscopy with propofol (N/A, 08/16/2015); polypectomy (08/16/2015); Laparoscopic partial colectomy (N/A, 09/11/2015); Cystoscopy with stent placement (Bilateral, 09/11/2015); Bladder repair (N/A, 09/11/2015); Ileostomy closure (N/A, 11/19/2015); Colon surgery; Anterior cervical decompression/discectomy fusion 4 level (N/A, 02/15/2019); and Colonoscopy (N/A, 02/14/2021). Peter [name] has a current medication list which includes the following prescription(s): ciclopirox, diazepam, fluticasone-salmeterol, hydrocodone-acetaminophen, [START ON 05/29/2021] hydrocodone-acetaminophen, hydroxychloroquine, and omeprazole. His primarily concern today is the Back Pain (lower)  Initial Vital Signs:  Pulse/HCG Rate: 93  Temp: (!) 97.1 F (36.2 C) Resp: 16 BP: 128/80 SpO2: 100 %  BMI: Estimated body mass index is 28.7 kg/m as calculated from the following:   Height as of this encounter: 5\' 10"  (1.778 m).   Weight as of this encounter: 200 lb (90.7 kg).  Risk Assessment: Allergies: Reviewed. He has No Known Allergies.  Allergy Precautions: None required Coagulopathies: Reviewed. None identified.  Blood-thinner therapy: None at this time Active Infection(s): Reviewed. None identified. Mr.  [name] is afebrile  Site Confirmation: Peter [name] was asked to confirm the procedure and laterality before marking the site Procedure checklist: Completed Consent: Before the procedure and under the influence of  no sedative(s), amnesic(s), or anxiolytics, the patient was informed of the treatment options, risks and possible complications. To fulfill our ethical and legal obligations, as recommended by the American Medical Association's Code of Ethics, I have informed the patient of my clinical impression; the nature and purpose of the treatment or procedure; the risks, benefits, and possible complications of the intervention; the alternatives, including doing nothing; the risk(s) and benefit(s) of the alternative treatment(s) or procedure(s); and the risk(s) and benefit(s) of doing nothing. The patient was provided information about the general risks and possible complications associated with the procedure. These may include, but are not limited to: failure to achieve desired goals, infection, bleeding, organ or nerve damage, allergic reactions, paralysis, and death. In addition, the patient was informed of those risks and complications associated to Spine-related procedures, such as failure to decrease pain; infection (i.e.: Meningitis, epidural or intraspinal abscess); bleeding (i.e.: epidural hematoma, subarachnoid hemorrhage, or any other type of intraspinal or peri-dural bleeding); organ or nerve damage (i.e.: Any type of peripheral nerve, nerve root, or spinal cord injury) with subsequent damage to sensory, motor, and/or autonomic systems, resulting in permanent pain, numbness, and/or weakness of one or several areas of the body; allergic reactions; (i.e.: anaphylactic reaction); and/or death. Furthermore, the patient was informed of those risks and complications associated with the medications. These include, but are not limited to: allergic reactions (i.e.: anaphylactic or anaphylactoid  reaction(s)); adrenal axis suppression; blood sugar elevation that in diabetics may result in ketoacidosis or comma; water retention that in patients with history of congestive heart failure may result in shortness of breath, pulmonary edema, and decompensation with resultant heart failure; weight gain; swelling or edema; medication-induced neural toxicity; particulate matter embolism and blood vessel occlusion with resultant organ, and/or nervous system infarction; and/or aseptic necrosis of one or more joints. Finally, the patient was informed that Medicine is not an exact science; therefore, there is also the possibility of unforeseen or unpredictable risks and/or possible complications that may result in a catastrophic outcome. The patient indicated having understood very clearly. We have given the patient no guarantees and we have made no promises. Enough time was given to the patient to ask questions, all of which were answered to the patient's satisfaction. Mr. Sedor has indicated that he wanted to continue with the procedure. Attestation: I, the ordering provider, attest that I have discussed with the patient the benefits, risks, side-effects, alternatives, likelihood of achieving goals, and potential problems during recovery for the procedure that I have provided informed consent. Date  Time: 05/22/2021  8:21 AM  Pre-Procedure Preparation:  Monitoring: As per clinic protocol. Respiration, ETCO2, SpO2, BP, heart rate and rhythm monitor placed and checked for adequate function Safety Precautions: Patient was assessed for positional comfort and pressure points before starting the procedure. Time-out: I initiated and conducted the "Time-out" before starting the procedure, as per protocol. The patient was asked to participate by confirming the accuracy of the "Time Out" information. Verification of the correct person, site, and procedure were performed and confirmed by me, the nursing staff, and the  patient. "Time-out" conducted as per Joint Commission's Universal Protocol (UP.01.01.01). Time: [phone]  Description of Procedure:          Laterality: Right Levels:  L2, L3, L4, L5, & S1 Medial Branch Level(s), at the L3-4, L4-5, and the L5-S1 lumbar facet joints. Area Prepped: Lumbosacral DuraPrep (Iodine Povacrylex [0.7% available iodine] and Isopropyl Alcohol, 74% w/w) Safety Precautions: Aspiration looking for blood return was conducted prior  to all injections. At no point did we inject any substances, as a needle was being advanced. Before injecting, the patient was told to immediately notify me if he was experiencing any new onset of "ringing in the ears, or metallic taste in the mouth". No attempts were made at seeking any paresthesias. Safe injection practices and needle disposal techniques used. Medications properly checked for expiration dates. SDV (single dose vial) medications used. After the completion of the procedure, all disposable equipment used was discarded in the proper designated medical waste containers. Local Anesthesia: Protocol guidelines were followed. The patient was positioned over the fluoroscopy table. The area was prepped in the usual manner. The time-out was completed. The target area was identified using fluoroscopy. A 12-in long, straight, sterile hemostat was used with fluoroscopic guidance to locate the targets for each level blocked. Once located, the skin was marked with an approved surgical skin marker. Once all sites were marked, the skin (epidermis, dermis, and hypodermis), as well as deeper tissues (fat, connective tissue and muscle) were infiltrated with a small amount of a short-acting local anesthetic, loaded on a 10cc syringe with a 25G, 1.5-in  Needle. An appropriate amount of time was allowed for local anesthetics to take effect before proceeding to the next step. Local Anesthetic: Lidocaine 2.0% The unused portion of the local anesthetic was discarded in the  proper designated containers. Technical explanation of process:  Radiofrequency Ablation (RFA) L2 Medial Branch Nerve RFA: The target area for the L2 medial branch is at the junction of the postero-lateral aspect of the superior articular process and the superior, posterior, and medial edge of the transverse process of L3. Under fluoroscopic guidance, a Radiofrequency needle was inserted until contact was made with os over the superior postero-lateral aspect of the pedicular shadow (target area). Sensory and motor testing was conducted to properly adjust the position of the needle. Once satisfactory placement of the needle was achieved, the numbing solution was slowly injected after negative aspiration for blood. 2.0 mL of the nerve block solution was injected without difficulty or complication. After waiting for at least 3 minutes, the ablation was performed. Once completed, the needle was removed intact. L3 Medial Branch Nerve RFA: The target area for the L3 medial branch is at the junction of the postero-lateral aspect of the superior articular process and the superior, posterior, and medial edge of the transverse process of L4. Under fluoroscopic guidance, a Radiofrequency needle was inserted until contact was made with os over the superior postero-lateral aspect of the pedicular shadow (target area). Sensory and motor testing was conducted to properly adjust the position of the needle. Once satisfactory placement of the needle was achieved, the numbing solution was slowly injected after negative aspiration for blood. 2.0 mL of the nerve block solution was injected without difficulty or complication. After waiting for at least 3 minutes, the ablation was performed. Once completed, the needle was removed intact. L4 Medial Branch Nerve RFA: The target area for the L4 medial branch is at the junction of the postero-lateral aspect of the superior articular process and the superior, posterior, and medial edge of  the transverse process of L5. Under fluoroscopic guidance, a Radiofrequency needle was inserted until contact was made with os over the superior postero-lateral aspect of the pedicular shadow (target area). Sensory and motor testing was conducted to properly adjust the position of the needle. Once satisfactory placement of the needle was achieved, the numbing solution was slowly injected after negative aspiration for blood. 2.0  mL of the nerve block solution was injected without difficulty or complication. After waiting for at least 3 minutes, the ablation was performed. Once completed, the needle was removed intact. L5 Medial Branch Nerve RFA: The target area for the L5 medial branch is at the junction of the postero-lateral aspect of the superior articular process of S1 and the superior, posterior, and medial edge of the sacral ala. Under fluoroscopic guidance, a Radiofrequency needle was inserted until contact was made with os over the superior postero-lateral aspect of the pedicular shadow (target area). Sensory and motor testing was conducted to properly adjust the position of the needle. Once satisfactory placement of the needle was achieved, the numbing solution was slowly injected after negative aspiration for blood. 2.0 mL of the nerve block solution was injected without difficulty or complication. After waiting for at least 3 minutes, the ablation was performed. Once completed, the needle was removed intact. S1 Medial Branch Nerve RFA: The target area for the S1 medial branch is located inferior to the junction of the S1 superior articular process and the L5 inferior articular process, posterior, inferior, and lateral to the 6 o'clock position of the L5-S1 facet joint, just superior to the S1 posterior foramen. Under fluoroscopic guidance, the Radiofrequency needle was advanced until contact was made with os over the Target area. Sensory and motor testing was conducted to properly adjust the position of  the needle. Once satisfactory placement of the needle was achieved, the numbing solution was slowly injected after negative aspiration for blood. 2.0 mL of the nerve block solution was injected without difficulty or complication. After waiting for at least 3 minutes, the ablation was performed. Once completed, the needle was removed intact. Radiofrequency lesioning (ablation):  Radiofrequency Generator: NeuroTherm [id number] Sensory Stimulation Parameters: 50 Hz was used to locate & identify the nerve, making sure that the needle was positioned such that there was no sensory stimulation below 0.3 V or above 0.7 V. Motor Stimulation Parameters: 2 Hz was used to evaluate the motor component. Care was taken not to lesion any nerves that demonstrated motor stimulation of the lower extremities at an output of less than 2.5 times that of the sensory threshold, or a maximum of 2.0 V. Lesioning Technique Parameters: Standard Radiofrequency settings. (Not bipolar or pulsed.) Temperature Settings: 80 degrees C Lesioning time: 60 seconds Intra-operative Compliance: Compliant Materials & Medications: Needle(s) (Electrode/Cannula) Type: Teflon-coated, curved tip, Radiofrequency needle(s) Gauge: 22G Length: 10cm Numbing solution: 0.2% PF-Ropivacaine + Triamcinolone (40 mg/mL) diluted to a final concentration of 4 mg of Triamcinolone/mL of Ropivacaine The unused portion of the solution was discarded in the proper designated containers.  Once the entire procedure was completed, the treated area was cleaned, making sure to leave some of the prepping solution back to take advantage of its long term bactericidal properties.    Illustration of the posterior view of the lumbar spine and the posterior neural structures. Laminae of L2 through S1 are labeled. DPRL5, dorsal primary ramus of L5; DPRS1, dorsal primary ramus of S1; DPR3, dorsal primary ramus of L3; FJ, facet (zygapophyseal) joint L3-L4; I, inferior articular  process of L4; LB1, lateral branch of dorsal primary ramus of L1; IAB, inferior articular branches from L3 medial branch (supplies L4-L5 facet joint); IBP, intermediate branch plexus; MB3, medial branch of dorsal primary ramus of L3; NR3, third lumbar nerve root; S, superior articular process of L5; SAB, superior articular branches from L4 (supplies L4-5 facet joint also); TP3, transverse process of L3.  Vitals:  05/22/21 0900 05/22/21 0905 05/22/21 0910 05/22/21 0915  BP: 136/90 (!) 133/93 (!) 168/86 (!) 166/88  Pulse: 83 86 90 88  Resp: Temp:      TempSrc:      SpO2: 97% 96% 97% 98%  Weight:      Height:       Start Time: 0841 hrs. End Time:   hrs.  Imaging Guidance (Spinal):          Type of Imaging Technique: Fluoroscopy Guidance (Spinal) Indication(s): Assistance in needle guidance and placement for procedures requiring needle placement in or near specific anatomical locations not easily accessible without such assistance. Exposure Time: Please see nurses notes. Contrast: None used. Fluoroscopic Guidance: I was personally present during the use of fluoroscopy. "Tunnel Vision Technique" used to obtain the best possible view of the target area. Parallax error corrected before commencing the procedure. "Direction-depth-direction" technique used to introduce the needle under continuous pulsed fluoroscopy. Once target was reached, antero-posterior, oblique, and lateral fluoroscopic projection used confirm needle placement in all planes. Images permanently stored in EMR. Interpretation: No contrast injected. I personally interpreted the imaging intraoperatively. Adequate needle placement confirmed in multiple planes. Permanent images saved into the patient's record.  Antibiotic Prophylaxis:   Anti-infectives (From admission, onward)    None      Indication(s): None identified  Post-operative Assessment:  Post-procedure Vital Signs:  Pulse/HCG Rate: 88 (nsr)  Temp:  (!) 97.1 F (36.2 C) Resp: 16 BP: (!) 166/88 SpO2: 98 %  EBL: None  Complications: No immediate post-treatment complications observed by team, or reported by patient.  Note: The patient tolerated the entire procedure well. A repeat set of vitals were taken after the procedure and the patient was kept under observation following institutional policy, for this type of procedure. Post-procedural neurological assessment was performed, showing return to baseline, prior to discharge. The patient was provided with post-procedure discharge instructions, including a section on how to identify potential problems. Should any problems arise concerning this procedure, the patient was given instructions to immediately contact us, at any time, without hesitation. In any case, we plan to contact the patient by telephone for a follow-up status report regarding this interventional procedure.  Comments:  No additional relevant information.  Plan of Care  Orders:  Orders Placed This Encounter  Procedures   Radiofrequency,Lumbar    Scheduling Instructions:     Side(s): Right-sided     Level: L3-4, L4-5, & L5-S1 Facets (L2, L3, L4, L5, & S1 Medial Branch Nerves)     Sedation: Patient's choice.     Timeframe: Today    Order Specific Question:   Where will this procedure be performed?    Answer:   [facility]   Radiofrequency,Lumbar    Standing Status:   Future    Standing Expiration Date:   08/22/2021    Scheduling Instructions:     Side(s): Left-sided     Level: L3-4, L4-5, & L5-S1 Facets (L2, L3, L4, L5, & S1 Medial Branch Nerves)     Sedation: Patient's choice.     Scheduling Timeframe: 2 weeks from now    Order Specific Question:   Where will this procedure be performed?    Answer:   [facility]   DG PAIN CLINIC C-ARM 1-60 MIN NO REPORT    Intraoperative interpretation by procedural physician at [facility].    Standing Status:   Standing    Number of Occurrences:   1  Order Specific Question:   Reason for exam:    Answer:   Assistance in needle guidance and placement for procedures requiring needle placement in or near specific anatomical locations not easily accessible without such assistance.   Informed Consent Details: Physician/Practitioner Attestation; Transcribe to consent form and obtain patient signature    Nursing Order: Transcribe to consent form and obtain patient signature. Note: Always confirm laterality of pain with Mr. Cuccaro, before procedure.    Order Specific Question:   Physician/Practitioner attestation of informed consent for procedure/surgical case    Answer:   I, the physician/practitioner, attest that I have discussed with the patient the benefits, risks, side effects, alternatives, likelihood of achieving goals and potential problems during recovery for the procedure that I have provided informed consent.    Order Specific Question:   Procedure    Answer:   Lumbar Facet Radiofrequency Ablation    Order Specific Question:   Physician/Practitioner performing the procedure    Answer:   [place]ha Behlke A. Laban Emperor, MD    Order Specific Question:   Indication/Reason    Answer:   Low Back Pain, with our without leg pain, due to Facet Joint Arthralgia (Joint Pain) known as Lumbar Facet Syndrome, secondary to Lumbar, and/or Lumbosacral Spondylosis (Arthritis of the Spine), without myelopathy or radiculopathy (Nerve Damage).   Provide equipment / supplies at bedside    "Radiofrequency Tray"; Large hemostat (x1); Small hemostat (x1); Towels (x8); 4x4 sterile sponge pack (x1) Needle type: Teflon-coated Radiofrequency Needle (Disposable  single use) Size: Regular Quantity: 5    Standing Status:   Standing    Number of Occurrences:   1    Order Specific Question:   Specify    Answer:   Radiofrequency Tray    Chronic Opioid Analgesic:  Hydrocodone/APAP 5/325 1 tab p.o. twice daily (10/01/2020). NO OPIOIDS - Abnormal UDS (11/04/2020): Positive for  marijuana MME/day: 10 mg/day   Medications ordered for procedure: Meds ordered this encounter  Medications   lidocaine (XYLOCAINE) 2 % (with pres) injection 400 mg   DISCONTD: lactated ringers infusion 1,000 mL   DISCONTD: midazolam (VERSED) 5 MG/5ML injection 0.5-2 mg    Make sure Flumazenil is available in the pyxis when using this medication. If oversedation occurs, administer 0.2 mg IV over 15 sec. If after 45 sec no response, administer 0.2 mg again over 1 min; may repeat at 1 min intervals; not to exceed 4 doses (1 mg)   triamcinolone acetonide (KENALOG-40) injection 40 mg   ropivacaine (PF) 2 mg/mL (0.2%) (NAROPIN) injection 9 mL   HYDROcodone-acetaminophen (NORCO/VICODIN) 5-325 MG tablet    Sig: Take 1 tablet by mouth every 6 (six) hours as needed for up to 7 days for severe pain. Must last 7 days.    Dispense:  28 tablet    Refill:  0    For acute post-operative pain. Not to be refilled. Must last 7 days.   HYDROcodone-acetaminophen (NORCO/VICODIN) 5-325 MG tablet    Sig: Take 1 tablet by mouth every 6 (six) hours as needed for up to 7 days for severe pain. Must last 7 days.    Dispense:  28 tablet    Refill:  0    For acute post-operative pain. Not to be refilled.  Must last 7 days.   diazepam (VALIUM) 10 MG tablet    Sig: Take 1 tablet (10 mg total) by mouth 60 (sixty) minutes before procedure for 1 dose. Take with a sip of water, on an empty stomach. Do  not eat anything for 6 hours prior to procedure.    Dispense:  1 tablet    Refill:  0    Must have a driver. Do not drive or operate machinery x 24 hours after taking this medication. Avoid taking within 4 hours of having taken an opioid pain medications.    Medications administered: We administered lidocaine, triamcinolone acetonide, and ropivacaine (PF) 2 mg/mL (0.2%).  See the medical record for exact dosing, route, and time of administration.  Follow-up plan:   Return in about 2 weeks (around 06/05/2021) for ( ),  Proc-day (T,Th), (Clinic) procedure: (L) L-FCT RFA #1, ValiumRx-given.       Interventional Therapies  Risk  Complexity Considerations:   Estimated body mass index is 28.7 kg/m as calculated from the following:   Height as of this encounter:  (1.778 m).   Weight as of this encounter: 200 lb (90.7 kg). NO OPIOIDS!!! - Abnormal UDS (11/04/2020): Positive for marijuana   Planned  Pending:   Therapeutic left lumbar facet RFA #1    Under consideration:   Therapeutic left lumbar facet RFA #1    Completed:   Diagnostic bilateral lumbar facet block #1 (04/01/2021)  Therapeutic right lumbar facet RFA x1 (05/22/2021)    Therapeutic  Palliative (PRN) options:   None established    Recent Visits Date Type Provider Dept  04/15/21 Office Visit Delano Metz, MD [facility]  04/01/21 Procedure visit Delano Metz, MD [facility]  03/20/21 Telemedicine Delano Metz, MD [facility]  Showing recent visits within past 90 days and meeting all other requirements Today's Visits Date Type Provider Dept  05/22/21 Procedure visit Delano Metz, MD [facility]  Showing today's visits and meeting all other requirements Future Appointments No visits were found meeting these conditions. Showing future appointments within next 90 days and meeting all other requirements Disposition: Discharge home  Discharge (Date  Time): 05/22/2021; 0919 hrs.   Primary Care Physician: [facility], [place] Location: [facility] Note by: Oswaldo Done, MD Date: 05/22/2021; Time: 10:53 AM  Disclaimer:  Medicine is not an exact science. The only guarantee in medicine is that nothing is guaranteed. It is important to note that the decision to proceed with this intervention was based on the information collected from the patient. The Data and conclusions were drawn from the patient's questionnaire, the  interview, and the physical examination. Because the information was provided in large part by the patient, it cannot be guaranteed that it has not been purposely or unconsciously manipulated. Every effort has been made to obtain as much relevant data as possible for this evaluation. It is important to note that the conclusions that lead to this procedure are derived in large part from the available data. Always take into account that the treatment will also be dependent on availability of resources and existing treatment guidelines, considered by other Pain Management Practitioners as being common knowledge and practice, at the time of the intervention. For Medico-Legal purposes, it is also important to point out that variation in procedural techniques and pharmacological choices are the acceptable norm. The indications, contraindications, technique, and results of the above procedure should only be interpreted and judged by a Board-Certified Interventional Pain Specialist with extensive familiarity and expertise in the same exact procedure and technique.

## 2021-05-22 ENCOUNTER — Other Ambulatory Visit: Payer: Self-pay

## 2021-05-22 ENCOUNTER — Ambulatory Visit: Payer: BC Managed Care – PPO | Admitting: Pain Medicine

## 2021-05-22 ENCOUNTER — Encounter: Payer: Self-pay | Admitting: Pain Medicine

## 2021-05-22 ENCOUNTER — Ambulatory Visit
Admission: RE | Admit: 2021-05-22 | Discharge: 2021-05-22 | Disposition: A | Discharge status: Home / Self Care | Payer: BC Managed Care – PPO | Source: Ambulatory Visit | Attending: Pain Medicine | Admitting: Pain Medicine

## 2021-05-22 VITALS — BP 166/88 | HR 88 | Temp 97.1°F | Resp 16 | Ht 70.0 in | Wt 200.0 lb

## 2021-05-22 DIAGNOSIS — M4317 Spondylolisthesis, lumbosacral region: Secondary | ICD-10-CM

## 2021-05-22 DIAGNOSIS — G8929 Other chronic pain: Secondary | ICD-10-CM | POA: Diagnosis not present

## 2021-05-22 DIAGNOSIS — M47816 Spondylosis without myelopathy or radiculopathy, lumbar region: Secondary | ICD-10-CM | POA: Diagnosis not present

## 2021-05-22 DIAGNOSIS — F419 Anxiety disorder, unspecified: Secondary | ICD-10-CM | POA: Diagnosis not present

## 2021-05-22 DIAGNOSIS — M5137 Other intervertebral disc degeneration, lumbosacral region: Secondary | ICD-10-CM | POA: Diagnosis not present

## 2021-05-22 DIAGNOSIS — M51379 Other intervertebral disc degeneration, lumbosacral region without mention of lumbar back pain or lower extremity pain: Secondary | ICD-10-CM

## 2021-05-22 DIAGNOSIS — M47817 Spondylosis without myelopathy or radiculopathy, lumbosacral region: Secondary | ICD-10-CM | POA: Diagnosis not present

## 2021-05-22 DIAGNOSIS — G8918 Other acute postprocedural pain: Secondary | ICD-10-CM

## 2021-05-22 DIAGNOSIS — M545 Low back pain, unspecified: Secondary | ICD-10-CM

## 2021-05-22 MED ORDER — MIDAZOLAM HCL 5 MG/5ML IJ SOLN: 0.5000 mg | Freq: Once | INTRAMUSCULAR | Status: DC

## 2021-05-22 MED ORDER — LIDOCAINE HCL 2 % IJ SOLN
INTRAMUSCULAR | Status: AC
  Filled 2021-05-22: qty 40

## 2021-05-22 MED ORDER — ROPIVACAINE HCL 2 MG/ML IJ SOLN
INTRAMUSCULAR | Status: AC
  Filled 2021-05-22: qty 20

## 2021-05-22 MED ORDER — LACTATED RINGERS IV SOLN: 1000.0000 mL | Freq: Once | INTRAVENOUS | Status: DC

## 2021-05-22 MED ORDER — ROPIVACAINE HCL 2 MG/ML IJ SOLN
9.0000 mL | Freq: Once | INTRAMUSCULAR | Status: AC
  Administered 2021-05-22: 09:00:00 9 mL via PERINEURAL

## 2021-05-22 MED ORDER — TRIAMCINOLONE ACETONIDE 40 MG/ML IJ SUSP
40.0000 mg | Freq: Once | INTRAMUSCULAR | Status: AC
  Administered 2021-05-22: 09:00:00 40 mg
  Filled 2021-05-22: qty 1

## 2021-05-22 MED ORDER — DIAZEPAM 10 MG PO TABS: 10.0000 mg | ORAL_TABLET | ORAL | 0 refills | Status: DC

## 2021-05-22 MED ORDER — HYDROCODONE-ACETAMINOPHEN 5-325 MG PO TABS
1.0000 | ORAL_TABLET | Freq: Four times a day (QID) | ORAL | 0 refills | Status: AC | PRN
Start: 2021-05-22 — End: 2021-05-29

## 2021-05-22 MED ORDER — LIDOCAINE HCL 2 % IJ SOLN
20.0000 mL | Freq: Once | INTRAMUSCULAR | Status: AC
  Administered 2021-05-22: 09:00:00 400 mg
  Filled 2021-05-22: qty 20

## 2021-05-22 MED ORDER — HYDROCODONE-ACETAMINOPHEN 5-325 MG PO TABS
1.0000 | ORAL_TABLET | Freq: Four times a day (QID) | ORAL | 0 refills | Status: AC | PRN
Start: 2021-05-29 — End: 2021-06-05

## 2021-05-22 NOTE — Patient Instructions (Addendum)
___________________________________________________________________________________________  Post-Radiofrequency (RF) Discharge Instructions  You have just completed a Radiofrequency Neurotomy.  The following instructions will provide you with information and guidelines for self-care upon discharge.  If at any time you have questions or concerns please call your physician. DO NOT DRIVE YOURSELF!!  Instructions: Apply ice: Fill a plastic sandwich bag with crushed ice. Cover it with a small towel and apply to injection site. Apply for 15 minutes then remove x 15 minutes. Repeat sequence on day of procedure, until you go to bed. The purpose is to minimize swelling and discomfort after procedure. Apply heat: Apply heat to procedure site starting the day following the procedure. The purpose is to treat any soreness and discomfort from the procedure. Food intake: No eating limitations, unless stipulated above.  Nevertheless, if you have had sedation, you may experience some nausea.  In this case, it may be wise to wait at least two hours prior to resuming regular diet. Physical activities: Keep activities to a minimum for the first 8 hours after the procedure. For the first 24 hours after the procedure, do not drive a motor vehicle,  Operate heavy machinery, power tools, or handle any weapons.  Consider walking with the use of an assistive device or accompanied by an adult for the first 24 hours.  Do not drink alcoholic beverages including beer.  Do not make any important decisions or sign any legal documents. Go home and rest today.  Resume activities tomorrow, as tolerated.  Use caution in moving about as you may experience mild leg weakness.  Use caution in cooking, use of household electrical appliances and climbing steps. Driving: If you have received any sedation, you are not allowed to drive for 24 hours after your procedure. Blood thinner: Restart your blood thinner 6 hours after your procedure. (Only  for those taking blood thinners) Insulin: As soon as you can eat, you may resume your normal dosing schedule. (Only for those taking insulin) Medications: May resume pre-procedure medications.  Do not take any drugs, other than what has been prescribed to you. Infection prevention: Keep procedure site clean and dry. Post-procedure Pain Diary: Extremely important that this be done correctly and accurately. Recorded information will be used to determine the next step in treatment. Pain evaluated is that of treated area only. Do not include pain from an untreated area. Complete every hour, on the hour, for the initial 8 hours. Set an alarm to help you do this part accurately. Do not go to sleep and have it completed later. It will not be accurate. Follow-up appointment: Keep your follow-up appointment after the procedure. Usually 2-6 weeks after radiofrequency. Bring you pain diary. The information collected will be essential for your long-term care.   Expect: From numbing medicine (AKA: Local Anesthetics): Numbness or decrease in pain. Onset: Full effect within 15 minutes of injected. Duration: It will depend on the type of local anesthetic used. On the average, 1 to 8 hours.  From steroids (when added): Decrease in swelling or inflammation. Once inflammation is improved, relief of the pain will follow. Onset of benefits: Depends on the amount of swelling present. The more swelling, the longer it will take for the benefits to be seen. In some cases, up to 10 days. Duration: Steroids will stay in the system x 2 weeks. Duration of benefits will depend on multiple posibilities including persistent irritating factors. From procedure: Some discomfort is to be expected once the numbing medicine wears off. In the case of radiofrequency procedures,  this may last as long as 6 weeks. Additional post-procedure pain medication is provided for this. Discomfort is minimized if ice and heat are applied as  instructed.  Call if: You experience numbness and weakness that gets worse with time, as opposed to wearing off. He experience any unusual bleeding, difficulty breathing, or loss of the ability to control your bowel and bladder. (This applies to Spinal procedures only) You experience any redness, swelling, heat, red streaks, elevated temperature, fever, or any other signs of a possible infection.  Emergency Numbers: Durning business hours (Monday - Thursday, 8:00 AM - 4:00 PM) (Friday, 9:00 AM - 12:00 Noon): [phone] After hours: [phone] ____________________________________________________________________________________________  ______________________________________________________________________  Preparing for Procedure with Oral Anxiolytics  Definition: Anxiolytics - Medications that provide muscle relaxation and decrease anxiety.  Procedure appointments are limited to planned procedures: No Prescription Refills. No disability issues will be discussed. No medication changes will be discussed.  Instructions: Oral Intake: Do not eat or drink anything for at least 6 hours prior to your procedure. (Exception: Blood Pressure Medication. See below.)  Anxiolytic Medication: This medication is meant to relax you during your procedure. DO NOT DRIVE OR OPERATE DANGEROUS MACHINERY WHILE UNDER THE INFLUENCE OF THIS MEDICATION. Take the oral medication (i.e.: Valium) as prescribed on the day of your procedure with just a sip of water. Prescription will be sent to your pharmacy, prior to the day of your procedure. Topical anesthetic: Your physician might have recommended a topical anesthetic/analgesic to apply over the area where the procedure will be done. If so, apply to area 1 hour prior to procedure. For exact location of application, ask your healthcare provider.    Transportation: A driver is required. You may not drive yourself after the procedure. Blood Pressure Medicine: Do  not forget to take your blood pressure medicine with a sip of water the morning of the procedure. If your Diastolic (lower reading) is above 100 mmHg, elective cases will be cancelled/rescheduled. Blood thinners: These will need to be stopped for procedures. Notify our staff if you are taking any blood thinners. Depending on which one you take, there will be specific instructions on how and when to stop it. Diabetics on insulin: Notify the staff so that you can be scheduled 1st case in the morning. If your diabetes requires high dose insulin, take only  of your normal insulin dose the morning of the procedure and notify the staff that you have done so. Preventing infections: Shower with an antibacterial soap the morning of your procedure. Build-up your immune system: Take 1000 mg of Vitamin C with every meal (3 times a day) the day prior to your procedure. Antibiotics: Inform the staff if you have a condition or reason that requires you to take antibiotics before dental procedures. Pregnancy: If you are pregnant, call and cancel the procedure. Sickness: If you have a cold, fever, or any active infections, call and cancel the procedure. Arrival: You must be in the facility at least 30 minutes prior to your scheduled procedure. Children: Do not bring children with you. Dress appropriately: Bring dark clothing that you would not mind if they get stained. Valuables: Do not bring any jewelry or valuables.  Reasons to call and reschedule or cancel your procedure:  NOTE: Following these recommendations will minimize the risk of a serious complication. Surgeries: Avoid having procedures within 2 weeks of any surgery. (Avoid for 2 weeks before or after any surgery). Flu Shots: Avoid having procedures within 2 weeks of  a flu shots. (Avoid for 2 weeks before or after immunizations). Barium: Avoid having a procedure within 7-10 days after having had a radiological study involving the use of radiological  contrast. (Myelograms, Barium swallow or enema study). Heart attacks: Avoid any elective procedures or surgeries for the initial 6 months after a "Myocardial Infarction" (Heart Attack). Blood thinners: It is imperative that you stop these medications before procedures. Let us know if you if you take any blood thinner.  Infection: Avoid procedures during or within two weeks of an infection (including chest colds or gastrointestinal problems). Symptoms associated with infections include: Localized redness, fever, chills, night sweats or profuse sweating, burning sensation when voiding, cough, congestion, stuffiness, runny nose, sore throat, diarrhea, nausea, vomiting, cold or Flu symptoms, recent or current infections. It is especially important if the infection is over the area that we intend to treat. Heart and lung problems: Symptoms that may suggest an active cardiopulmonary problem include: cough, chest pain, breathing difficulties or shortness of breath, dizziness, ankle swelling, uncontrolled high or unusually low blood pressure, and/or palpitations. If you are experiencing any of these symptoms, cancel your procedure and contact your primary care physician for an evaluation.  Remember:  Regular Business hours are:  Monday to Thursday 8:00 AM to 4:00 PM  Provider's Schedule: Delano Metz, MD:  Procedure days: Tuesday and Thursday 7:30 AM to 4:00 PM  Edward Jolly, MD:  Procedure days: Monday and Wednesday 7:30 AM to 4:00 PM ______________________________________________________________________  ____________________________________________________________________________________________  General Risks and Possible Complications  Patient Responsibilities: It is important that you read this as it is part of your informed consent. It is our duty to inform you of the risks and possible complications associated with treatments offered to you. It is your responsibility as a patient to read  this and to ask questions about anything that is not clear or that you believe was not covered in this document.  Patient's Rights: You have the right to refuse treatment. You also have the right to change your mind, even after initially having agreed to have the treatment done. However, under this last option, if you wait until the last second to change your mind, you may be charged for the materials used up to that point.  Introduction: Medicine is not an Visual merchandiser. Everything in Medicine, including the lack of treatment(s), carries the potential for danger, harm, or loss (which is by definition: Risk). In Medicine, a complication is a secondary problem, condition, or disease that can aggravate an already existing one. All treatments carry the risk of possible complications. The fact that a side effects or complications occurs, does not imply that the treatment was conducted incorrectly. It must be clearly understood that these can happen even when everything is done following the highest safety standards.  No treatment: You can choose not to proceed with the proposed treatment alternative. The "PRO(s)" would include: avoiding the risk of complications associated with the therapy. The "CON(s)" would include: not getting any of the treatment benefits. These benefits fall under one of three categories: diagnostic; therapeutic; and/or palliative. Diagnostic benefits include: getting information which can ultimately lead to improvement of the disease or symptom(s). Therapeutic benefits are those associated with the successful treatment of the disease. Finally, palliative benefits are those related to the decrease of the primary symptoms, without necessarily curing the condition (example: decreasing the pain from a flare-up of a chronic condition, such as incurable terminal cancer).  General Risks and Complications: These are associated to most  interventional treatments. They can occur alone, or in  combination. They fall under one of the following six (6) categories: no benefit or worsening of symptoms; bleeding; infection; nerve damage; allergic reactions; and/or death. No benefits or worsening of symptoms: In Medicine there are no guarantees, only probabilities. No healthcare provider can ever guarantee that a medical treatment will work, they can only state the probability that it may. Furthermore, there is always the possibility that the condition may worsen, either directly, or indirectly, as a consequence of the treatment. Bleeding: This is more common if the patient is taking a blood thinner, either prescription or over the counter (example: Goody Powders, Fish oil, Aspirin, Garlic, etc.), or if suffering a condition associated with impaired coagulation (example: Hemophilia, cirrhosis of the liver, low platelet counts, etc.). However, even if you do not have one on these, it can still happen. If you have any of these conditions, or take one of these drugs, make sure to notify your treating physician. Infection: This is more common in patients with a compromised immune system, either due to disease (example: diabetes, cancer, human immunodeficiency virus [HIV], etc.), or due to medications or treatments (example: therapies used to treat cancer and rheumatological diseases). However, even if you do not have one on these, it can still happen. If you have any of these conditions, or take one of these drugs, make sure to notify your treating physician. Nerve Damage: This is more common when the treatment is an invasive one, but it can also happen with the use of medications, such as those used in the treatment of cancer. The damage can occur to small secondary nerves, or to large primary ones, such as those in the spinal cord and brain. This damage may be temporary or permanent and it may lead to impairments that can range from temporary numbness to permanent paralysis and/or brain death. Allergic  Reactions: Any time a substance or material comes in contact with our body, there is the possibility of an allergic reaction. These can range from a mild skin rash (contact dermatitis) to a severe systemic reaction (anaphylactic reaction), which can result in death. Death: In general, any medical intervention can result in death, most of the time due to an unforeseen complication. ____________________________________________________________________________________________ ____________________________________________________________________________________________  Blood Thinners  IMPORTANT NOTICE:  If you take any of these, make sure to notify the nursing staff.  Failure to do so may result in injury.  Recommended time intervals to stop and restart blood-thinners, before & after invasive procedures  Generic Name Brand Name Pre-procedure. Stop this long before procedure. Post-procedure. Minimum waiting period before restarting.  Abciximab Reopro 15 days 2 hrs  Alteplase Activase 10 days 10 days  Anagrelide Agrylin    Apixaban Eliquis 3 days 6 hrs  Cilostazol Pletal 3 days 5 hrs  Clopidogrel Plavix 7-10 days 2 hrs  Dabigatran Pradaxa 5 days 6 hrs  Dalteparin Fragmin 24 hours 4 hrs  Dipyridamole Aggrenox 11days 2 hrs  Edoxaban Lixiana; Savaysa 3 days 2 hrs  Enoxaparin  Lovenox 24 hours 4 hrs  Eptifibatide Integrillin 8 hours 2 hrs  Fondaparinux  Arixtra 72 hours 12 hrs  Hydroxychloroquine Plaquenil 11 days   Prasugrel Effient 7-10 days 6 hrs  Reteplase Retavase 10 days 10 days  Rivaroxaban Xarelto 3 days 6 hrs  Ticagrelor Brilinta 5-7 days 6 hrs  Ticlopidine Ticlid 10-14 days 2 hrs  Tinzaparin Innohep 24 hours 4 hrs  Tirofiban Aggrastat 8 hours 2 hrs  Warfarin Coumadin 5 days 2  hrs   Other medications with blood-thinning effects  Product indications Generic (Brand) names Note  Cholesterol Lipitor Stop 4 days before procedure  Blood thinner (injectable) Heparin (LMW or LMWH  Heparin) Stop 24 hours before procedure  Cancer Ibrutinib (Imbruvica) Stop 7 days before procedure  Malaria/Rheumatoid Hydroxychloroquine (Plaquenil) Stop 11 days before procedure  Thrombolytics  10 days before or after procedures   Over-the-counter (OTC) Products with blood-thinning effects  Product Common names Stop Time  Aspirin > 325 mg Goody Powders, Excedrin, etc. 11 days  Aspirin ? 81 mg  7 days  Fish oil  4 days  Garlic supplements  7 days  Ginkgo biloba  36 hours  Ginseng  24 hours  NSAIDs Ibuprofen, Naprosyn, etc. 3 days  Vitamin E  4 days   ____________________________________________________________________________________________ ___________________________________________________________________________________________  Post-Radiofrequency (RF) Discharge Instructions  You have just completed a Radiofrequency Neurotomy.  The following instructions will provide you with information and guidelines for self-care upon discharge.  If at any time you have questions or concerns please call your physician. DO NOT DRIVE YOURSELF!!  Instructions: Apply ice: Fill a plastic sandwich bag with crushed ice. Cover it with a small towel and apply to injection site. Apply for 15 minutes then remove x 15 minutes. Repeat sequence on day of procedure, until you go to bed. The purpose is to minimize swelling and discomfort after procedure. Apply heat: Apply heat to procedure site starting the day following the procedure. The purpose is to treat any soreness and discomfort from the procedure. Food intake: No eating limitations, unless stipulated above.  Nevertheless, if you have had sedation, you may experience some nausea.  In this case, it may be wise to wait at least two hours prior to resuming regular diet. Physical activities: Keep activities to a minimum for the first 8 hours after the procedure. For the first 24 hours after the procedure, do not drive a motor vehicle,  Operate heavy  machinery, power tools, or handle any weapons.  Consider walking with the use of an assistive device or accompanied by an adult for the first 24 hours.  Do not drink alcoholic beverages including beer.  Do not make any important decisions or sign any legal documents. Go home and rest today.  Resume activities tomorrow, as tolerated.  Use caution in moving about as you may experience mild leg weakness.  Use caution in cooking, use of household electrical appliances and climbing steps. Driving: If you have received any sedation, you are not allowed to drive for 24 hours after your procedure. Blood thinner: Restart your blood thinner 6 hours after your procedure. (Only for those taking blood thinners) Insulin: As soon as you can eat, you may resume your normal dosing schedule. (Only for those taking insulin) Medications: May resume pre-procedure medications.  Do not take any drugs, other than what has been prescribed to you. Infection prevention: Keep procedure site clean and dry. Post-procedure Pain Diary: Extremely important that this be done correctly and accurately. Recorded information will be used to determine the next step in treatment. Pain evaluated is that of treated area only. Do not include pain from an untreated area. Complete every hour, on the hour, for the initial 8 hours. Set an alarm to help you do this part accurately. Do not go to sleep and have it completed later. It will not be accurate. Follow-up appointment: Keep your follow-up appointment after the procedure. Usually 2-6 weeks after radiofrequency. Bring you pain diary. The information collected will be essential  for your long-term care.   Expect: From numbing medicine (AKA: Local Anesthetics): Numbness or decrease in pain. Onset: Full effect within 15 minutes of injected. Duration: It will depend on the type of local anesthetic used. On the average, 1 to 8 hours.  From steroids (when added): Decrease in swelling or  inflammation. Once inflammation is improved, relief of the pain will follow. Onset of benefits: Depends on the amount of swelling present. The more swelling, the longer it will take for the benefits to be seen. In some cases, up to 10 days. Duration: Steroids will stay in the system x 2 weeks. Duration of benefits will depend on multiple posibilities including persistent irritating factors. From procedure: Some discomfort is to be expected once the numbing medicine wears off. In the case of radiofrequency procedures, this may last as long as 6 weeks. Additional post-procedure pain medication is provided for this. Discomfort is minimized if ice and heat are applied as instructed.  Call if: You experience numbness and weakness that gets worse with time, as opposed to wearing off. He experience any unusual bleeding, difficulty breathing, or loss of the ability to control your bowel and bladder. (This applies to Spinal procedures only) You experience any redness, swelling, heat, red streaks, elevated temperature, fever, or any other signs of a possible infection.  Emergency Numbers: Durning business hours (Monday - Thursday, 8:00 AM - 4:00 PM) (Friday, 9:00 AM - 12:00 Noon): [phone] After hours: [phone] ____________________________________________________________________________________________

## 2021-05-23 ENCOUNTER — Telehealth: Payer: Self-pay | Admitting: *Deleted

## 2021-05-23 NOTE — Telephone Encounter (Signed)
No problems post procedure. 

## 2021-05-28 DIAGNOSIS — L4050 Arthropathic psoriasis, unspecified: Secondary | ICD-10-CM | POA: Diagnosis not present

## 2021-05-28 DIAGNOSIS — M0579 Rheumatoid arthritis with rheumatoid factor of multiple sites without organ or systems involvement: Secondary | ICD-10-CM | POA: Diagnosis not present

## 2021-05-28 DIAGNOSIS — Z7960 Long term (current) use of unspecified immunomodulators and immunosuppressants: Secondary | ICD-10-CM | POA: Diagnosis not present

## 2021-05-28 DIAGNOSIS — M159 Polyosteoarthritis, unspecified: Secondary | ICD-10-CM | POA: Diagnosis not present

## 2021-06-02 ENCOUNTER — Telehealth: Payer: Self-pay | Admitting: Pain Medicine

## 2021-06-02 NOTE — Telephone Encounter (Signed)
Had RFA on 05-19-21. Has already taken both 7-day supply scripts for Hydrocodone. Asking for additional Hydrocodone. Explained to patient that both scripts were to last 7 days, and he cannot have additional Hydrocodone at this time.

## 2021-06-02 NOTE — Telephone Encounter (Signed)
Patient called stating he needs refill on pain meds given to him with RF. I explained this was a one time script for this specific procedure and pain meds are not refilled on the phone. He still wants Nurse to call him.

## 2021-06-12 ENCOUNTER — Other Ambulatory Visit: Payer: Self-pay

## 2021-06-12 ENCOUNTER — Encounter: Payer: Self-pay | Admitting: Pain Medicine

## 2021-06-12 ENCOUNTER — Ambulatory Visit (HOSPITAL_BASED_OUTPATIENT_CLINIC_OR_DEPARTMENT_OTHER): Payer: BC Managed Care – PPO | Admitting: Pain Medicine

## 2021-06-12 ENCOUNTER — Ambulatory Visit
Admission: RE | Admit: 2021-06-12 | Discharge: 2021-06-12 | Disposition: A | Discharge status: Home / Self Care | Payer: BC Managed Care – PPO | Source: Ambulatory Visit | Attending: Pain Medicine | Admitting: Pain Medicine

## 2021-06-12 VITALS — BP 131/86 | HR 81 | Temp 97.2°F | Resp 18 | Ht 70.0 in | Wt 205.0 lb

## 2021-06-12 DIAGNOSIS — M5137 Other intervertebral disc degeneration, lumbosacral region: Secondary | ICD-10-CM | POA: Diagnosis not present

## 2021-06-12 DIAGNOSIS — M47817 Spondylosis without myelopathy or radiculopathy, lumbosacral region: Secondary | ICD-10-CM | POA: Insufficient documentation

## 2021-06-12 DIAGNOSIS — M545 Low back pain, unspecified: Secondary | ICD-10-CM | POA: Insufficient documentation

## 2021-06-12 DIAGNOSIS — M47816 Spondylosis without myelopathy or radiculopathy, lumbar region: Secondary | ICD-10-CM

## 2021-06-12 DIAGNOSIS — G894 Chronic pain syndrome: Secondary | ICD-10-CM

## 2021-06-12 DIAGNOSIS — M4317 Spondylolisthesis, lumbosacral region: Secondary | ICD-10-CM

## 2021-06-12 DIAGNOSIS — G8929 Other chronic pain: Secondary | ICD-10-CM

## 2021-06-12 DIAGNOSIS — G8918 Other acute postprocedural pain: Secondary | ICD-10-CM | POA: Insufficient documentation

## 2021-06-12 DIAGNOSIS — R937 Abnormal findings on diagnostic imaging of other parts of musculoskeletal system: Secondary | ICD-10-CM

## 2021-06-12 MED ORDER — PENTAFLUOROPROP-TETRAFLUOROETH EX AERO
INHALATION_SPRAY | Freq: Once | CUTANEOUS | Status: AC
  Administered 2021-06-12: 30 via TOPICAL
  Filled 2021-06-12: qty 116

## 2021-06-12 MED ORDER — LIDOCAINE HCL 2 % IJ SOLN
20.0000 mL | Freq: Once | INTRAMUSCULAR | Status: AC
  Administered 2021-06-12: 400 mg

## 2021-06-12 MED ORDER — HYDROCODONE-ACETAMINOPHEN 5-325 MG PO TABS: 1.0000 | ORAL_TABLET | Freq: Four times a day (QID) | ORAL | 0 refills | Status: AC | PRN

## 2021-06-12 MED ORDER — ROPIVACAINE HCL 2 MG/ML IJ SOLN
INTRAMUSCULAR | Status: AC
  Filled 2021-06-12: qty 20

## 2021-06-12 MED ORDER — ROPIVACAINE HCL 2 MG/ML IJ SOLN
9.0000 mL | Freq: Once | INTRAMUSCULAR | Status: AC
  Administered 2021-06-12: 9 mL via PERINEURAL

## 2021-06-12 MED ORDER — TRIAMCINOLONE ACETONIDE 40 MG/ML IJ SUSP
40.0000 mg | Freq: Once | INTRAMUSCULAR | Status: AC
  Administered 2021-06-12: 40 mg
  Filled 2021-06-12: qty 1

## 2021-06-12 NOTE — Progress Notes (Signed)
Had Valium 5 mg PTA at 0900

## 2021-06-12 NOTE — Progress Notes (Signed)
PROVIDER NOTE: Information contained herein reflects review and annotations entered in association with encounter. Interpretation of such information and data should be left to medically-trained personnel. Information provided to patient can be located elsewhere in the medical record under "Patient Instructions". Document created using STT-dictation technology, any transcriptional errors that may result from process are unintentional.    Patient: Peter [name]  Service Category: Procedure Provider: Oswaldo Done, MD DOB: [id number] DOS: 06/12/2021 Location: [facility] MRN: [id number] Setting: Ambulatory - outpatient Referring Provider: Delano Metz, MD Type: Established Patient Specialty: Interventional Pain Management PCP: [facility]  Primary Reason for Visit: Interventional Pain Management Treatment. CC: Back Pain (left, lower)   Procedure:          Anesthesia, Analgesia, Anxiolysis:  Type: Thermal Lumbar Facet, Medial Branch Radiofrequency Ablation/Neurotomy  #1  Primary Purpose: Therapeutic Region: Posterolateral Lumbosacral Spine Level: L2, L3, L4, L5, & S1 Medial Branch Level(s). These levels will denervate the L3-4, L4-5, and the L5-S1 lumbar facet joints. Laterality: Left  Anesthesia: Local (1-2% Lidocaine)  Anxiolysis: Oral (Valium 10 mg) Sedation: None  Guidance: Fluoroscopy           Position: Prone   Indications: 1. Lumbosacral facet joint syndrome   2. Lumbar facet hypertrophy   3. Spondylosis without myelopathy or radiculopathy, lumbosacral region   4. DDD (degenerative disc disease), lumbosacral   5. Chronic low back pain (1ry area of Pain) (Bilateral) (R>L) w/o sciatica   6. Anterolisthesis of lumbosacral spine (L5/S1)   7. Abnormal MRI, lumbar spine (07/26/2020)   8. Chronic pain syndrome    Peter [name] has been dealing with the above chronic pain for longer than three months and has either failed to respond, was unable  to tolerate, or simply did not get enough benefit from other more conservative therapies including, but not limited to: 1. Over-the-counter medications 2. Anti-inflammatory medications 3. Muscle relaxants 4. Membrane stabilizers 5. Opioids 6. Physical therapy and/or chiropractic manipulation 7. Modalities (Heat, ice, etc.) 8. Invasive techniques such as nerve blocks. Peter [name] has attained more than 50% relief of the pain from a series of diagnostic injections conducted in separate occasions.  Pain Score: Pre-procedure: 8 /10 Post-procedure: 8 /10    Post-Procedure Evaluation  Procedure(s):    Procedure:          Anesthesia, Analgesia, Anxiolysis:  Type: Thermal Lumbar Facet, Medial Branch Radiofrequency Ablation/Neurotomy  #1  Primary Purpose: Therapeutic Region: Posterolateral Lumbosacral Spine Level: L2, L3, L4, L5, & S1 Medial Branch Level(s). These levels will denervate the L3-4, L4-5, and the L5-S1 lumbar facet joints. Laterality: Right  Anesthesia: Local (1-2% Lidocaine)  Anxiolysis: Oral Valium (10 mg) Sedation: None  Guidance: Fluoroscopy           Position: Prone   Pain Score: Pre-procedure: 9 /10 Post-procedure: 0-No pain/10   Anxiolysis: Please see nurses note.  Effectiveness during initial hour after procedure (Ultra-Short Term Relief): 100 %.  Local anesthetic used: Long-acting (4-6 hours) Effectiveness: Defined as any analgesic benefit obtained secondary to the administration of local anesthetics. This carries significant diagnostic value as to the etiological location, or anatomical origin, of the pain. Duration of benefit is expected to coincide with the duration of the local anesthetic used.  Effectiveness during initial 4-6 hours after procedure (Short-Term Relief): 50 %.  Long-term benefit: Defined as any relief past the pharmacologic duration of the local anesthetics.  Effectiveness past the initial 6 hours after procedure (Long-Term Relief): 60  %.  Benefits, current: Defined as benefit present at the time of this evaluation.   Analgesia: The patient indicates having attained an ongoing 60% relief of the pain primarily on the right side where the radiofrequency ablation was done. Function: Somewhat improved ROM: Somewhat improved  Pre-op H&P Assessment:  Peter [name] is a 55 y.o. (year old), male patient, seen today for interventional treatment. He  has a past surgical history that includes Cholecystectomy (1989); Tonsillectomy (1970); Appendectomy (1988); Colonoscopy with propofol (N/A, 08/16/2015); polypectomy (08/16/2015); Laparoscopic partial colectomy (N/A, 09/11/2015); Cystoscopy with stent placement (Bilateral, 09/11/2015); Bladder repair (N/A, 09/11/2015); Ileostomy closure (N/A, 11/19/2015); Colon surgery; Anterior cervical decompression/discectomy fusion 4 level (N/A, 02/15/2019); and Colonoscopy (N/A, 02/14/2021). Peter [name] has a current medication list which includes the following prescription(s): ciclopirox, diazepam, fluticasone-salmeterol, hydrocodone-acetaminophen, [START ON 06/19/2021] hydrocodone-acetaminophen, hydroxychloroquine, and omeprazole. His primarily concern today is the Back Pain (left, lower)  Initial Vital Signs:  Pulse/HCG Rate: 81  Temp: (!) 97.2 F (36.2 C) Resp: 16 BP: 118/75 SpO2: 100 %  BMI: Estimated body mass index is 29.41 kg/m as calculated from the following:   Height as of this encounter: 5\' 10"  (1.778 m).   Weight as of this encounter: 205 lb (93 kg).  Risk Assessment: Allergies: Reviewed. He has No Known Allergies.  Allergy Precautions: None required Coagulopathies: Reviewed. None identified.  Blood-thinner therapy: None at this time Active Infection(s): Reviewed. None identified. Peter [name] is afebrile  Site Confirmation: Peter [name] was asked to confirm the procedure and laterality before marking the site Procedure checklist: Completed Consent: Before the procedure and under the influence  of no sedative(s), amnesic(s), or anxiolytics, the patient was informed of the treatment options, risks and possible complications. To fulfill our ethical and legal obligations, as recommended by the American Medical Association's Code of Ethics, I have informed the patient of my clinical impression; the nature and purpose of the treatment or procedure; the risks, benefits, and possible complications of the intervention; the alternatives, including doing nothing; the risk(s) and benefit(s) of the alternative treatment(s) or procedure(s); and the risk(s) and benefit(s) of doing nothing. The patient was provided information about the general risks and possible complications associated with the procedure. These may include, but are not limited to: failure to achieve desired goals, infection, bleeding, organ or nerve damage, allergic reactions, paralysis, and death. In addition, the patient was informed of those risks and complications associated to Spine-related procedures, such as failure to decrease pain; infection (i.e.: Meningitis, epidural or intraspinal abscess); bleeding (i.e.: epidural hematoma, subarachnoid hemorrhage, or any other type of intraspinal or peri-dural bleeding); organ or nerve damage (i.e.: Any type of peripheral nerve, nerve root, or spinal cord injury) with subsequent damage to sensory, motor, and/or autonomic systems, resulting in permanent pain, numbness, and/or weakness of one or several areas of the body; allergic reactions; (i.e.: anaphylactic reaction); and/or death. Furthermore, the patient was informed of those risks and complications associated with the medications. These include, but are not limited to: allergic reactions (i.e.: anaphylactic or anaphylactoid reaction(s)); adrenal axis suppression; blood sugar elevation that in diabetics may result in ketoacidosis or comma; water retention that in patients with history of congestive heart failure may result in shortness of breath,  pulmonary edema, and decompensation with resultant heart failure; weight gain; swelling or edema; medication-induced neural toxicity; particulate matter embolism and blood vessel occlusion with resultant organ, and/or nervous system infarction; and/or aseptic necrosis of one or more joints. Finally, the patient was informed that Medicine is not an Lorraine Lax;  therefore, there is also the possibility of unforeseen or unpredictable risks and/or possible complications that may result in a catastrophic outcome. The patient indicated having understood very clearly. We have given the patient no guarantees and we have made no promises. Enough time was given to the patient to ask questions, all of which were answered to the patient's satisfaction. Mr. Knobel has indicated that he wanted to continue with the procedure. Attestation: I, the ordering provider, attest that I have discussed with the patient the benefits, risks, side-effects, alternatives, likelihood of achieving goals, and potential problems during recovery for the procedure that I have provided informed consent. Date  Time: 06/12/2021  9:53 AM  Pre-Procedure Preparation:  Monitoring: As per clinic protocol. Respiration, ETCO2, SpO2, BP, heart rate and rhythm monitor placed and checked for adequate function Safety Precautions: Patient was assessed for positional comfort and pressure points before starting the procedure. Time-out: I initiated and conducted the "Time-out" before starting the procedure, as per protocol. The patient was asked to participate by confirming the accuracy of the "Time Out" information. Verification of the correct person, site, and procedure were performed and confirmed by me, the nursing staff, and the patient. "Time-out" conducted as per Joint Commission's Universal Protocol (UP.01.01.01). Time: 1105  Description of Procedure:          Laterality: Left Levels:  L2, L3, L4, L5, & S1 Medial Branch Level(s), at the L3-4,  L4-5, and the L5-S1 lumbar facet joints. Area Prepped: Lumbosacral DuraPrep (Iodine Povacrylex [0.7% available iodine] and Isopropyl Alcohol, 74% w/w) Safety Precautions: Aspiration looking for blood return was conducted prior to all injections. At no point did we inject any substances, as a needle was being advanced. Before injecting, the patient was told to immediately notify me if he was experiencing any new onset of "ringing in the ears, or metallic taste in the mouth". No attempts were made at seeking any paresthesias. Safe injection practices and needle disposal techniques used. Medications properly checked for expiration dates. SDV (single dose vial) medications used. After the completion of the procedure, all disposable equipment used was discarded in the proper designated medical waste containers. Local Anesthesia: Protocol guidelines were followed. The patient was positioned over the fluoroscopy table. The area was prepped in the usual manner. The time-out was completed. The target area was identified using fluoroscopy. A 12-in long, straight, sterile hemostat was used with fluoroscopic guidance to locate the targets for each level blocked. Once located, the skin was marked with an approved surgical skin marker. Once all sites were marked, the skin (epidermis, dermis, and hypodermis), as well as deeper tissues (fat, connective tissue and muscle) were infiltrated with a small amount of a short-acting local anesthetic, loaded on a 10cc syringe with a 25G, 1.5-in  Needle. An appropriate amount of time was allowed for local anesthetics to take effect before proceeding to the next step. Local Anesthetic: Lidocaine 2.0% The unused portion of the local anesthetic was discarded in the proper designated containers. Technical explanation of process:  Radiofrequency Ablation (RFA) L2 Medial Branch Nerve RFA: The target area for the L2 medial branch is at the junction of the postero-lateral aspect of the  superior articular process and the superior, posterior, and medial edge of the transverse process of L3. Under fluoroscopic guidance, a Radiofrequency needle was inserted until contact was made with os over the superior postero-lateral aspect of the pedicular shadow (target area). Sensory and motor testing was conducted to properly adjust the position of the needle. Once satisfactory  placement of the needle was achieved, the numbing solution was slowly injected after negative aspiration for blood. 2.0 mL of the nerve block solution was injected without difficulty or complication. After waiting for at least 3 minutes, the ablation was performed. Once completed, the needle was removed intact. L3 Medial Branch Nerve RFA: The target area for the L3 medial branch is at the junction of the postero-lateral aspect of the superior articular process and the superior, posterior, and medial edge of the transverse process of L4. Under fluoroscopic guidance, a Radiofrequency needle was inserted until contact was made with os over the superior postero-lateral aspect of the pedicular shadow (target area). Sensory and motor testing was conducted to properly adjust the position of the needle. Once satisfactory placement of the needle was achieved, the numbing solution was slowly injected after negative aspiration for blood. 2.0 mL of the nerve block solution was injected without difficulty or complication. After waiting for at least 3 minutes, the ablation was performed. Once completed, the needle was removed intact. L4 Medial Branch Nerve RFA: The target area for the L4 medial branch is at the junction of the postero-lateral aspect of the superior articular process and the superior, posterior, and medial edge of the transverse process of L5. Under fluoroscopic guidance, a Radiofrequency needle was inserted until contact was made with os over the superior postero-lateral aspect of the pedicular shadow (target area). Sensory and  motor testing was conducted to properly adjust the position of the needle. Once satisfactory placement of the needle was achieved, the numbing solution was slowly injected after negative aspiration for blood. 2.0 mL of the nerve block solution was injected without difficulty or complication. After waiting for at least 3 minutes, the ablation was performed. Once completed, the needle was removed intact. L5 Medial Branch Nerve RFA: The target area for the L5 medial branch is at the junction of the postero-lateral aspect of the superior articular process of S1 and the superior, posterior, and medial edge of the sacral ala. Under fluoroscopic guidance, a Radiofrequency needle was inserted until contact was made with os over the superior postero-lateral aspect of the pedicular shadow (target area). Sensory and motor testing was conducted to properly adjust the position of the needle. Once satisfactory placement of the needle was achieved, the numbing solution was slowly injected after negative aspiration for blood. 2.0 mL of the nerve block solution was injected without difficulty or complication. After waiting for at least 3 minutes, the ablation was performed. Once completed, the needle was removed intact. S1 Medial Branch Nerve RFA: The target area for the S1 medial branch is located inferior to the junction of the S1 superior articular process and the L5 inferior articular process, posterior, inferior, and lateral to the 6 o'clock position of the L5-S1 facet joint, just superior to the S1 posterior foramen. Under fluoroscopic guidance, the Radiofrequency needle was advanced until contact was made with os over the Target area. Sensory and motor testing was conducted to properly adjust the position of the needle. Once satisfactory placement of the needle was achieved, the numbing solution was slowly injected after negative aspiration for blood. 2.0 mL of the nerve block solution was injected without difficulty or  complication. After waiting for at least 3 minutes, the ablation was performed. Once completed, the needle was removed intact. Radiofrequency lesioning (ablation):  Radiofrequency Generator: NeuroTherm [id number] Sensory Stimulation Parameters: 50 Hz was used to locate & identify the nerve, making sure that the needle was positioned such that there was  no sensory stimulation below 0.3 V or above 0.7 V. Motor Stimulation Parameters: 2 Hz was used to evaluate the motor component. Care was taken not to lesion any nerves that demonstrated motor stimulation of the lower extremities at an output of less than 2.5 times that of the sensory threshold, or a maximum of 2.0 V. Lesioning Technique Parameters: Standard Radiofrequency settings. (Not bipolar or pulsed.) Temperature Settings: 80 degrees C Lesioning time: 60 seconds Intra-operative Compliance: Compliant Materials & Medications: Needle(s) (Electrode/Cannula) Type: Teflon-coated, curved tip, Radiofrequency needle(s) Gauge: 22G Length: 10cm Numbing solution: 0.2% PF-Ropivacaine + Triamcinolone (40 mg/mL) diluted to a final concentration of 4 mg of Triamcinolone/mL of Ropivacaine The unused portion of the solution was discarded in the proper designated containers.  Once the entire procedure was completed, the treated area was cleaned, making sure to leave some of the prepping solution back to take advantage of its long term bactericidal properties.    Illustration of the posterior view of the lumbar spine and the posterior neural structures. Laminae of L2 through S1 are labeled. DPRL5, dorsal primary ramus of L5; DPRS1, dorsal primary ramus of S1; DPR3, dorsal primary ramus of L3; FJ, facet (zygapophyseal) joint L3-L4; I, inferior articular process of L4; LB1, lateral branch of dorsal primary ramus of L1; IAB, inferior articular branches from L3 medial branch (supplies L4-L5 facet joint); IBP, intermediate branch plexus; MB3, medial branch of dorsal primary  ramus of L3; NR3, third lumbar nerve root; S, superior articular process of L5; SAB, superior articular branches from L4 (supplies L4-5 facet joint also); TP3, transverse process of L3.  Vitals:   06/12/21 1115 06/12/21 1120 06/12/21 1125 06/12/21 1130  BP: 135/74 130/87 132/85 131/86  Pulse: 75 79 77 81  Resp: Temp:      TempSrc:      SpO2: 99% 98% 98% 99%  Weight:      Height:       Start Time: 1105 hrs. End Time: 1129 hrs.  Imaging Guidance (Spinal):          Type of Imaging Technique: Fluoroscopy Guidance (Spinal) Indication(s): Assistance in needle guidance and placement for procedures requiring needle placement in or near specific anatomical locations not easily accessible without such assistance. Exposure Time: Please see nurses notes. Contrast: None used. Fluoroscopic Guidance: I was personally present during the use of fluoroscopy. "Tunnel Vision Technique" used to obtain the best possible view of the target area. Parallax error corrected before commencing the procedure. "Direction-depth-direction" technique used to introduce the needle under continuous pulsed fluoroscopy. Once target was reached, antero-posterior, oblique, and lateral fluoroscopic projection used confirm needle placement in all planes. Images permanently stored in EMR. Interpretation: No contrast injected. I personally interpreted the imaging intraoperatively. Adequate needle placement confirmed in multiple planes. Permanent images saved into the patient's record.  Antibiotic Prophylaxis:   Anti-infectives (From admission, onward)    None      Indication(s): None identified  Post-operative Assessment:  Post-procedure Vital Signs:  Pulse/HCG Rate: 81 (nsr)  Temp: (!) 97.2 F (36.2 C) Resp: 18 BP: 131/86 SpO2: 99 %  EBL: None  Complications: No immediate post-treatment complications observed by team, or reported by patient.  Note: The patient tolerated the entire procedure well. A  repeat set of vitals were taken after the procedure and the patient was kept under observation following institutional policy, for this type of procedure. Post-procedural neurological assessment was performed, showing return to baseline, prior to discharge. The patient was provided with  post-procedure discharge instructions, including a section on how to identify potential problems. Should any problems arise concerning this procedure, the patient was given instructions to immediately contact us, at any time, without hesitation. In any case, we plan to contact the patient by telephone for a follow-up status report regarding this interventional procedure.  Comments:  No additional relevant information.  Plan of Care  Orders:  Orders Placed This Encounter  Procedures   Radiofrequency,Lumbar    Scheduling Instructions:     Side(s): Left-sided     Level: L3-4, L4-5, & L5-S1 Facets (L2, L3, L4, L5, & S1 Medial Branch Nerves)     Sedation: Patient's choice.     Timeframe: Today    Order Specific Question:   Where will this procedure be performed?    Answer:   [facility]   DG PAIN [facility] C-ARM 1-60 MIN NO REPORT    Intraoperative interpretation by procedural physician at [facility].    Standing Status:   Standing    Number of Occurrences:   1    Order Specific Question:   Reason for exam:    Answer:   Assistance in needle guidance and placement for procedures requiring needle placement in or near specific anatomical locations not easily accessible without such assistance.   Informed Consent Details: Physician/Practitioner Attestation; Transcribe to consent form and obtain patient signature    Nursing Order: Transcribe to consent form and obtain patient signature. Note: Always confirm laterality of pain with Mr. Hoon, before procedure.    Order Specific Question:   Physician/Practitioner attestation of informed consent for procedure/surgical case    Answer:   I, the  physician/practitioner, attest that I have discussed with the patient the benefits, risks, side effects, alternatives, likelihood of achieving goals and potential problems during recovery for the procedure that I have provided informed consent.    Order Specific Question:   Procedure    Answer:   Lumbar Facet Radiofrequency Ablation    Order Specific Question:   Physician/Practitioner performing the procedure    Answer:   Tranice Laduke A. Laban Emperor, MD    Order Specific Question:   Indication/Reason    Answer:   Low Back Pain, with our without leg pain, due to Facet Joint Arthralgia (Joint Pain) known as Lumbar Facet Syndrome, secondary to Lumbar, and/or Lumbosacral Spondylosis (Arthritis of the Spine), without myelopathy or radiculopathy (Nerve Damage).   Provide equipment / supplies at bedside    "Radiofrequency Tray"; Large hemostat (x1); Small hemostat (x1); Towels (x8); 4x4 sterile sponge pack (x1) Needle type: Teflon-coated Radiofrequency Needle (Disposable  single use) Size: Regular Quantity: 5    Standing Status:   Standing    Number of Occurrences:   1    Order Specific Question:   Specify    Answer:   Radiofrequency Tray    Chronic Opioid Analgesic:  Hydrocodone/APAP 5/325 1 tab p.o. twice daily (10/01/2020). NO OPIOIDS - Abnormal UDS (11/04/2020): Positive for marijuana MME/day: 10 mg/day   Medications ordered for procedure: Meds ordered this encounter  Medications   lidocaine (XYLOCAINE) 2 % (with pres) injection 400 mg   pentafluoroprop-tetrafluoroeth (GEBAUERS) aerosol   ropivacaine (PF) 2 mg/mL (0.2%) (NAROPIN) injection 9 mL   triamcinolone acetonide (KENALOG-40) injection 40 mg   HYDROcodone-acetaminophen (NORCO/VICODIN) 5-325 MG tablet    Sig: Take 1 tablet by mouth every 6 (six) hours as needed for up to 7 days for severe pain. Must last 7 days.    Dispense:  28 tablet  Refill:  0    For acute post-operative pain. Not to be refilled. Must last 7 days.    HYDROcodone-acetaminophen (NORCO/VICODIN) 5-325 MG tablet    Sig: Take 1 tablet by mouth every 6 (six) hours as needed for up to 7 days for severe pain. Must last 7 days.    Dispense:  28 tablet    Refill:  0    For acute post-operative pain. Not to be refilled.  Must last 7 days.    Medications administered: We administered lidocaine, pentafluoroprop-tetrafluoroeth, ropivacaine (PF) 2 mg/mL (0.2%), and triamcinolone acetonide.  See the medical record for exact dosing, route, and time of administration.  Follow-up plan:   Return in about 6 weeks (around 07/24/2021) for Proc-day (T,Th), (VV), (PPE).      Interventional Therapies  Risk  Complexity Considerations:   Estimated body mass index is 28.7 kg/m as calculated from the following:   Height as of this encounter: 5\' 10"  (1.778 m).   Weight as of this encounter: 200 lb (90.7 kg). NO OPIOIDS!!! - Abnormal UDS (11/04/2020): Positive for marijuana   Planned  Pending:      Under consideration:      Completed:   Diagnostic bilateral lumbar facet block x2 (04/01/2021) (100/100/40/100)  Therapeutic right lumbar facet RFA x1 (05/22/2021) (100/50/60/60 @ 3 wks post-RF)  Therapeutic left lumbar facet RFA x1 (06/12/2021)    Therapeutic  Palliative (PRN) options:   None established    Recent Visits Date Type Provider Dept  05/22/21 Procedure visit 05/24/21, MD [facility]  04/15/21 Office Visit 06/15/21, MD [facility]  04/01/21 Procedure visit 04/03/21, MD [facility]  03/20/21 Telemedicine 03/22/21, MD [facility]  Showing recent visits within past 90 days and meeting all other requirements Today's Visits Date Type Provider Dept  06/12/21 Procedure visit 14/08/22, MD [facility]  Showing today's visits and meeting all other requirements Future Appointments Date Type Provider Dept  07/24/21 Appointment 07/26/21, MD  [facility]  Showing future appointments within next 90 days and meeting all other requirements Disposition: Discharge home  Discharge (Date  Time): 06/12/2021; 1136 hrs.   Primary Care Physician: [facility], [facility] Location: [facility] Note by: [facility], MD Date: 06/12/2021; Time: 11:46 AM  Disclaimer:  Medicine is not an exact science. The only guarantee in medicine is that nothing is guaranteed. It is important to note that the decision to proceed with this intervention was based on the information collected from the patient. The Data and conclusions were drawn from the patient's questionnaire, the interview, and the physical examination. Because the information was provided in large part by the patient, it cannot be guaranteed that it has not been purposely or unconsciously manipulated. Every effort has been made to obtain as much relevant data as possible for this evaluation. It is important to note that the conclusions that lead to this procedure are derived in large part from the available data. Always take into account that the treatment will also be dependent on availability of resources and existing treatment guidelines, considered by other Pain Management Practitioners as being common knowledge and practice, at the time of the intervention. For Medico-Legal purposes, it is also important to point out that variation in procedural techniques and pharmacological choices are the acceptable norm. The indications, contraindications, technique, and results of the above procedure should only be interpreted and judged by a Board-Certified Interventional Pain Specialist with  extensive familiarity and expertise in the same exact procedure and technique.

## 2021-06-12 NOTE — Patient Instructions (Signed)

## 2021-06-13 ENCOUNTER — Telehealth: Payer: Self-pay

## 2021-06-13 NOTE — Telephone Encounter (Signed)
Post procedure phone call.  Patient states he is doing good.  

## 2021-07-24 ENCOUNTER — Ambulatory Visit: Payer: BC Managed Care – PPO | Attending: Pain Medicine | Admitting: Pain Medicine

## 2021-07-24 ENCOUNTER — Other Ambulatory Visit: Payer: Self-pay

## 2021-07-24 DIAGNOSIS — M4310 Spondylolisthesis, site unspecified: Secondary | ICD-10-CM

## 2021-07-24 DIAGNOSIS — M47817 Spondylosis without myelopathy or radiculopathy, lumbosacral region: Secondary | ICD-10-CM | POA: Diagnosis not present

## 2021-07-24 DIAGNOSIS — M5137 Other intervertebral disc degeneration, lumbosacral region: Secondary | ICD-10-CM

## 2021-07-24 DIAGNOSIS — G8929 Other chronic pain: Secondary | ICD-10-CM

## 2021-07-24 DIAGNOSIS — M4317 Spondylolisthesis, lumbosacral region: Secondary | ICD-10-CM | POA: Diagnosis not present

## 2021-07-24 DIAGNOSIS — M47816 Spondylosis without myelopathy or radiculopathy, lumbar region: Secondary | ICD-10-CM

## 2021-07-24 DIAGNOSIS — M545 Low back pain, unspecified: Secondary | ICD-10-CM | POA: Diagnosis not present

## 2021-07-24 DIAGNOSIS — R937 Abnormal findings on diagnostic imaging of other parts of musculoskeletal system: Secondary | ICD-10-CM

## 2021-07-24 NOTE — Progress Notes (Signed)
Patient: Peter [name]  Service Category: E/M  Provider: Gaspar Cola, MD  DOB: 1966-04-08  DOS: 07/24/2021  Location: Office  MRN: [id number]  Setting: Ambulatory outpatient  Referring Provider: [place]*  Type: Established Patient  Specialty: Interventional Pain Management  PCP: [facility]  Location: Remote location  Delivery: TeleHealth     Virtual Encounter - Pain Management PROVIDER NOTE: Information contained herein reflects review and annotations entered in association with encounter. Interpretation of such information and data should be left to medically-trained personnel. Information provided to patient can be located elsewhere in the medical record under "Patient Instructions". Document created using STT-dictation technology, any transcriptional errors that may result from process are unintentional.    Contact & Pharmacy Preferred: [phone] Home: [phone] (home) Mobile: [phone] (mobile) E-mail: No e-mail address on record  [id number] [id number]: [phone]: [address] [place] [address]Phone: [phone]: [phone]  Pre-screening  Peter [name] offered "in-person" vs "virtual" encounter. He indicated preferring virtual for this encounter.   Reason COVID-19*   Social distancing based on CDC and AMA recommendations.   I contacted Peter [name] 07/24/2021 via telephone.      I clearly identified myself as FGaspar Cola MD. I verified that I was speaking with the correct person using two identifiers (Name: Peter [name] and date of birth: [birth year].  Consent I sought verbal advanced consent from Peter Perchesfor virtual visit interactions. I informed Peter [name] of possible security and privacy concerns, risks, and limitations  associated with providing "not-in-person" medical evaluation and management services. I also informed Peter [name] of the availability of "in-person" appointments. Finally, I informed him that there would be a charge for the virtual visit and that he could be  personally, fully or partially, financially responsible for it. [name] understanding and agreed to proceed.   Historic Elements   Mr. MERRIK MITCHELLEis a 56y.o. year old, male patient evaluated today after our last contact on 06/12/2021. Peter [name] has a past medical history of Arthritis, COPD [place]ch[place] obstructive [place] disease[place], Diverticulitis, Generalized headaches, GERD (gastroesophageal reflux disease), History of colonoscopy with polypectomy (09/08/2018), Lower back pain, Rectal pain, and Thrombosed hemorrhoids. He also  has a past surgical history that includes Cholecystectomy (1989); Tonsillectomy (1970); Appendectomy (1988); Colonoscopy with propofol (N/A, 08/16/2015); polypectomy (08/16/2015); Laparoscopic partial colectomy (N/A, 09/11/2015); Cystoscopy with stent placement (Bilateral, 09/11/2015); Bladder repair (N/A, 09/11/2015); Ileostomy closure (N/A, 11/19/2015); Colon surgery; Anterior cervical decompression/discectomy fusion 4 level (N/A, 02/15/2019); and Colonoscopy (N/A, 02/14/2021). Mr. PGutridgehas a current medication list which includes the following prescription(s): ciclopirox, fluticasone-salmeterol, hydroxychloroquine, and omeprazole. He  reports that he has been smoking cigarettes. He has a 42.00 pack-year smoking history. He has never used smokeless tobacco. He reports that he does not drink alcohol and does not use drugs. [name] No Known Allergies.   HPI  Today, he is being contacted for a post-procedure assessment.  Interestingly, the patient refers having an ongoing 100% relief of his left lower back, but he has noticed that the pain on the right side has returned.  Today we had a long conversation  regarding this and the possibility that it may be a contribution from the sacroiliac joint or hip joint.  I talked to him about testing that area by performing a Patrick maneuver and I taught him how to do it over the phone, but unfortunately he was at work and he cannot actually do the maneuver right then and there.  He has indicated that whenever he has a chance to do it at home, he will and if this reproduces his pain he was instructed to give me a call.  He understood and accepted.  Post-procedure evaluation     Procedure:          Anesthesia, Analgesia, Anxiolysis:  Type: Thermal Lumbar Facet, Medial Branch Radiofrequency Ablation/Neurotomy  #1  Primary Purpose: Therapeutic Region: Posterolateral Lumbosacral Spine Level: L2, L3, L4, L5, & S1 Medial Branch Level(s). These levels will denervate the L3-4, L4-5, and the L5-S1 lumbar facet joints. Laterality: Left  Anesthesia: Local (1-2% Lidocaine)  Anxiolysis: Oral (Valium 10 mg) Sedation: None  Guidance: Fluoroscopy           Position: Prone   Indications: 1. Lumbosacral facet joint syndrome   2. Lumbar facet hypertrophy   3. Spondylosis without myelopathy or radiculopathy, lumbosacral region   4. DDD (degenerative disc disease), lumbosacral   5. Chronic low back pain (1ry area of Pain) (Bilateral) (R>L) w/o sciatica   6. Anterolisthesis of lumbosacral spine (L5/S1)   7. Abnormal MRI, lumbar spine (07/26/2020)   8. Chronic pain syndrome    Peter [name] has been dealing with the above chronic pain for longer than three months and has either failed to respond, was unable to tolerate, or simply did not get enough benefit from other more conservative therapies including, but not limited to: 1. Over-the-counter medications 2. Anti-inflammatory medications 3. Muscle relaxants 4. Membrane stabilizers 5. Opioids 6. Physical therapy and/or chiropractic manipulation 7. Modalities (Heat, ice, etc.) 8. Invasive techniques such as nerve  blocks. Peter [name] has attained more than 50% relief of the pain from a series of diagnostic injections conducted in separate occasions.  Pain Score: Pre-procedure: 8 /10 Post-procedure: 8 /10   Effectiveness:  Initial hour after procedure: 100 %. Subsequent 4-6 hours post-procedure: 100 %. Analgesia past initial 6 hours: 100 % (good pain relief x 2 weeks and then the pain gradually came back.  blames the pain on type of work he does.  pain is no where near the same severity as pre procedure.). Ongoing improvement:  Analgesic: The patient indicates continuing to have a 100% ongoing relief of the left lower back.  It is the right side of his lower back that has began to hurt again.  Today we talked about several possibilities but because he was at home, I cannot have him do a Patrick maneuver to test his SI joint and/your hip joint.  However I did talk to him about the Saralyn Pilar maneuver and how to perform that maneuver and I told him that in the event that it trigger the pain, that he should give me a call and then we can have him come in and repeat the test.  He understood and accepted. Function: Mr. Tisdale reports improvement in function on the left side. ROM: Mr. Dangerfield reports improvement in ROM on the left side.  Pharmacotherapy Assessment   Opioid Analgesic: Hydrocodone/APAP 5/325 1 tab p.o. twice daily (10/01/2020). NO OPIOIDS - Abnormal UDS (11/04/2020): Positive for marijuana MME/day: 10 mg/day   Monitoring: [place] PMP: PDMP reviewed during this encounter.       Pharmacotherapy: No side-effects or adverse reactions reported. Compliance: No problems identified. Effectiveness: Clinically  acceptable. Plan: Refer to "POC". UDS:  Summary  Date Value Ref Range Status  11/04/2020 Note  Final    Comment:    ==================================================================== Compliance Drug Analysis, Ur ==================================================================== Test                              Result       Flag       Units  Drug Present not Declared for Prescription Verification   Carboxy-THC                    90           UNEXPECTED ng/mg creat    Carboxy-THC is a metabolite of tetrahydrocannabinol (THC). Source of    THC is most commonly herbal marijuana or marijuana-based products,    but THC is also present in a scheduled prescription medication.    Trace amounts of THC can be present in hemp and cannabidiol (CBD)    products. This test is not intended to distinguish between delta-9-    tetrahydrocannabinol, the predominant form of THC in most herbal or    marijuana-based products, and delta-8-tetrahydrocannabinol.    Gabapentin                     PRESENT      UNEXPECTED   Salicylate                     PRESENT      UNEXPECTED ==================================================================== Test                      Result    Flag   Units      Ref Range   Creatinine              84               mg/dL      >=20 ==================================================================== Declared Medications:  The flagging and interpretation on this report are based on the  following declared medications.  Unexpected results may arise from  inaccuracies in the declared medications.   **Note: The testing scope of this panel does not include the  following reported medications:   Ciclopirox (Loprox)  Fluticasone (Advair)  Hydroxychloroquine (Plaquenil)  Omeprazole (Prilosec)  Salmeterol (Advair) ==================================================================== For clinical consultation, please call [phone]. ====================================================================      Laboratory Chemistry Profile   Renal Lab Results  Component Value Date   BUN 17 01/14/2021   CREATININE 0.99 01/14/2021   BCR 17 01/14/2021   GFRAA 92 02/02/2020   GFRNONAA >60 11/15/2020    Hepatic Lab Results  Component Value Date   AST 23 01/14/2021    ALT 12 01/14/2021   ALBUMIN 4.5 01/14/2021   ALKPHOS 53 01/14/2021   LIPASE 34 12/31/2014    Electrolytes Lab Results  Component Value Date   NA 141 01/14/2021   K 4.7 01/14/2021   CL 102 01/14/2021   CALCIUM 9.5 01/14/2021   MG 2.1 11/04/2020   PHOS 4.2 09/22/2015    Bone Lab Results  Component Value Date   25OHVITD1 22 (L) 11/04/2020   25OHVITD2 <1.0 11/04/2020   25OHVITD3 22 11/04/2020    Inflammation (CRP: Acute Phase) (ESR: Chronic Phase) Lab Results  Component Value Date   CRP <1 11/04/2020   ESRSEDRATE 36 (H) 11/04/2020         Note: Above Lab results reviewed.  Imaging  DG PAIN CLINIC C-ARM 1-60 MIN NO REPORT Fluoro was used, but no Radiologist interpretation will be provided.  Please refer to "NOTES" tab for provider progress note.  Assessment  The primary encounter diagnosis was Chronic low back pain (Right) w/o sciatica. Diagnoses of Retrolisthesis (T12/L1) & (L1/L2), Anterolisthesis of lumbosacral spine (L5/S1), Lumbosacral facet joint syndrome, Lumbar facet hypertrophy, DDD (degenerative disc disease), lumbosacral, and Abnormal MRI, lumbar spine (07/26/2020) were also pertinent to this visit.  Plan of Care  Problem-specific:  No problem-specific Assessment & Plan notes found for this encounter.  Mr. KEVRON PATELLA has a current medication list which includes the following long-term medication(s): fluticasone-salmeterol and omeprazole.  Pharmacotherapy (Medications Ordered): No orders of the defined types were placed in this encounter.  Orders:  No orders of the defined types were placed in this encounter.  Follow-up plan:   No follow-ups on file.     Interventional Therapies  Risk   Complexity Considerations:   Estimated body mass index is 28.7 kg/m as calculated from the following:   Height as of this encounter: _0  (1.778 m).   Weight as of this encounter: 200 lb (90.7 kg). NO OPIOIDS!!! - Abnormal UDS (11/04/2020): Positive for marijuana    Planned   Pending:      Under consideration:   Diagnostic right SI joint Blk #1  Diagnostic right IA hip injection #1  Diagnostic right T12-L1, L1-2 facet MBB #1    Completed:   Diagnostic bilateral lumbar facet block x2 (04/01/2021) (100/100/40/100)  Therapeutic right lumbar facet RFA x1 (05/22/2021) (100/50/60/60)  Therapeutic left lumbar facet RFA x1 (06/12/2021) (100/100/100/100)   Therapeutic   Palliative (PRN) options:   None established    Recent Visits Date Type Provider Dept  06/12/21 Procedure visit Milinda Pointer, MD [facility]  05/22/21 Procedure visit Milinda Pointer, MD [facility]  Showing recent visits within past 90 days and meeting all other requirements Today's Visits Date Type Provider Dept  07/24/21 Office Visit Milinda Pointer, MD [facility]  Showing today's visits and meeting all other requirements Future Appointments No visits were found meeting these conditions. Showing future appointments within next 90 days and meeting all other requirements  I discussed the assessment and treatment plan with the patient. The patient was provided an opportunity to ask questions and all were answered. The patient agreed with the plan and demonstrated an understanding of the instructions.  Patient advised to call back or seek an in-person evaluation if the symptoms or condition worsens.  Duration of encounter: 15 minutes.  Note by: Gaspar Cola, MD Date: 07/24/2021; Time: 3:19 PM

## 2021-07-25 ENCOUNTER — Telehealth: Payer: Self-pay | Admitting: Pain Medicine

## 2021-07-25 NOTE — Telephone Encounter (Signed)
Patient lvmail 8:36 07-25-21 stating he did the test Dr. Laban Emperor wanted him to do. His pain hurts more in the groin and lower back than any other place. Please pass on to Dr. Laban Emperor

## 2021-08-12 ENCOUNTER — Other Ambulatory Visit: Payer: Self-pay

## 2021-08-12 ENCOUNTER — Emergency Department (HOSPITAL_BASED_OUTPATIENT_CLINIC_OR_DEPARTMENT_OTHER): Payer: BC Managed Care – PPO

## 2021-08-12 ENCOUNTER — Encounter (HOSPITAL_BASED_OUTPATIENT_CLINIC_OR_DEPARTMENT_OTHER): Payer: Self-pay

## 2021-08-12 ENCOUNTER — Emergency Department (HOSPITAL_BASED_OUTPATIENT_CLINIC_OR_DEPARTMENT_OTHER)
Admission: EM | Admit: 2021-08-12 | Discharge: 2021-08-12 | Disposition: A | Discharge status: Home / Self Care | Payer: BC Managed Care – PPO | Attending: Emergency Medicine | Admitting: Emergency Medicine

## 2021-08-12 DIAGNOSIS — G8929 Other chronic pain: Secondary | ICD-10-CM | POA: Insufficient documentation

## 2021-08-12 DIAGNOSIS — R4182 Altered mental status, unspecified: Secondary | ICD-10-CM | POA: Insufficient documentation

## 2021-08-12 DIAGNOSIS — Z20822 Contact with and (suspected) exposure to covid-19: Secondary | ICD-10-CM | POA: Diagnosis not present

## 2021-08-12 DIAGNOSIS — T40601A Poisoning by unspecified narcotics, accidental (unintentional), initial encounter: Secondary | ICD-10-CM | POA: Diagnosis not present

## 2021-08-12 DIAGNOSIS — J449 Chronic obstructive pulmonary disease, unspecified: Secondary | ICD-10-CM | POA: Insufficient documentation

## 2021-08-12 DIAGNOSIS — I469 Cardiac arrest, cause unspecified: Secondary | ICD-10-CM | POA: Diagnosis not present

## 2021-08-12 DIAGNOSIS — T50901A Poisoning by unspecified drugs, medicaments and biological substances, accidental (unintentional), initial encounter: Secondary | ICD-10-CM

## 2021-08-12 DIAGNOSIS — M545 Low back pain, unspecified: Secondary | ICD-10-CM | POA: Diagnosis not present

## 2021-08-12 DIAGNOSIS — Z7951 Long term (current) use of inhaled steroids: Secondary | ICD-10-CM | POA: Diagnosis not present

## 2021-08-12 DIAGNOSIS — R404 Transient alteration of awareness: Secondary | ICD-10-CM | POA: Diagnosis not present

## 2021-08-12 DIAGNOSIS — R4020 Unspecified coma: Secondary | ICD-10-CM | POA: Diagnosis not present

## 2021-08-12 DIAGNOSIS — R0689 Other abnormalities of breathing: Secondary | ICD-10-CM | POA: Diagnosis not present

## 2021-08-12 DIAGNOSIS — R61 Generalized hyperhidrosis: Secondary | ICD-10-CM | POA: Diagnosis not present

## 2021-08-12 LAB — COMPREHENSIVE METABOLIC PANEL
ALT: 15 U/L (ref 0–44)
AST: 26 U/L (ref 15–41)
Albumin: 4.6 g/dL (ref 3.5–5.0)
Alkaline Phosphatase: 38 U/L (ref 38–126)
Anion gap: 11 (ref 5–15)
BUN: 18 mg/dL (ref 6–20)
CO2: 25 mmol/L (ref 22–32)
Calcium: 9 mg/dL (ref 8.9–10.3)
Chloride: 105 mmol/L (ref 98–111)
Creatinine, Ser: 1.21 mg/dL (ref 0.61–1.24)
GFR, Estimated: >60 mL/min (ref >60)
Glucose, Bld: 162 mg/dL — ABNORMAL HIGH (ref 70–99)
Potassium: 4 mmol/L (ref 3.5–5.1)
Sodium: 141 mmol/L (ref 135–145)
Total Bilirubin: 0.2 mg/dL — ABNORMAL LOW (ref 0.3–1.2)
Total Protein: 7.3 g/dL (ref 6.5–8.1)

## 2021-08-12 LAB — RAPID URINE DRUG SCREEN, HOSP PERFORMED
Amphetamines: NOT DETECTED
Barbiturates: NOT DETECTED
Benzodiazepines: NOT DETECTED
Cocaine: NOT DETECTED
Opiates: NOT DETECTED
Tetrahydrocannabinol: POSITIVE — AB

## 2021-08-12 LAB — LIPASE, BLOOD: Lipase: 12 U/L (ref 11–51)

## 2021-08-12 LAB — CBC WITH DIFFERENTIAL/PLATELET
Abs Immature Granulocytes: 0.04 10*3/uL (ref 0.00–0.07)
Basophils Absolute: 0.1 10*3/uL (ref 0.0–0.1)
Basophils Relative: 1 %
Eosinophils Absolute: 0.1 10*3/uL (ref 0.0–0.5)
Eosinophils Relative: 1 %
HCT: 46.2 % (ref 39.0–52.0)
Hemoglobin: 15.4 g/dL (ref 13.0–17.0)
Immature Granulocytes: 0 %
Lymphocytes Relative: 16 %
Lymphs Abs: 1.7 10*3/uL (ref 0.7–4.0)
MCH: 31.8 pg (ref 26.0–34.0)
MCHC: 33.3 g/dL (ref 30.0–36.0)
MCV: 95.5 fL (ref 80.0–100.0)
Monocytes Absolute: 0.6 10*3/uL (ref 0.1–1.0)
Monocytes Relative: 6 %
Neutro Abs: 7.9 10*3/uL — ABNORMAL HIGH (ref 1.7–7.7)
Neutrophils Relative %: 76 %
Platelets: 220 10*3/uL (ref 150–400)
RBC: 4.84 MIL/uL (ref 4.22–5.81)
RDW: 12.6 % (ref 11.5–15.5)
WBC: 10.4 10*3/uL (ref 4.0–10.5)
nRBC: 0 % (ref 0.0–0.2)

## 2021-08-12 LAB — TROPONIN I (HIGH SENSITIVITY): Troponin I (High Sensitivity): 13 ng/L (ref <18)

## 2021-08-12 LAB — RESP PANEL BY RT-PCR (FLU A&B, COVID) ARPGX2
Influenza A by PCR: NEGATIVE
Influenza B by PCR: NEGATIVE
SARS Coronavirus 2 by RT PCR: NEGATIVE

## 2021-08-12 LAB — ACETAMINOPHEN LEVEL: Acetaminophen (Tylenol), Serum: <10 ug/mL — ABNORMAL LOW (ref 10–30)

## 2021-08-12 LAB — ETHANOL: Alcohol, Ethyl (B): 11 mg/dL — ABNORMAL HIGH (ref <10)

## 2021-08-12 LAB — SALICYLATE LEVEL: Salicylate Lvl: 25.4 mg/dL (ref 7.0–30.0)

## 2021-08-12 LAB — MAGNESIUM: Magnesium: 2.1 mg/dL (ref 1.7–2.4)

## 2021-08-12 MED ORDER — LIDOCAINE 5 % EX PTCH
2.0000 | MEDICATED_PATCH | CUTANEOUS | Status: DC
  Filled 2021-08-12: qty 2

## 2021-08-12 NOTE — ED Provider Notes (Signed)
MEDCENTER [facility] EMERGENCY DEPT Provider Note   CSN: [id number] Arrival date & time: 08/12/21  1825     History  Chief Complaint  Patient presents with   Altered Mental Status    Peter [name] is a 56 y.o. male history of chronic back pain who comes in by EMS after witnessed overdose that required CPR by [facility] PD upon arrival to the scene as well as administration of 2.5 mg of intranasal Narcan prior to regaining consciousness.  Patient states that he has chronic back pain and typically follows with [facility] but has not gone in several months.  He states that today at work his back was hurting him more than normal which he mentioned to a friend.  She then offered him an unknown white powder to "help with the pain".  He states that he snorted 1/4-1/2 pinky nails volume of the powder and shortly thereafter reportedly was witnessed to collapse.  According to EMS when [facility] PD arrived, patient did have a pulse but had agonal breathing and was unconscious.  The officers did administer chest compressions while administering Narcan and patient subsequently regained consciousness.  Patient did not want to be transported to the emergency department but states that he came because his boss told him she was not going to allow him to return to work tomorrow if he did not come and be evaluated.  He states he has never snorted any medication in the past but does have a longstanding history of being prescribed opiates.  He has not picked up any prescriptions in a few months per PMD P review.  He does endorse use of marijuana smoking 1/2-1 full joint today and admits to drinking alcohol but cannot quantify for me how much.  He denies any history of IV drug use.  Patient does also state that he has been taking 8 to 10 packets of BC powders daily due to his back pain.  He is unable to tell me his kind that has aspirin only or aspirin and acetaminophen.  I personally  reviewed the patient's medical records.  He has history of chronic back pain with surgical intervention of the neck in the past, COPD currently still smoking.  He is not anticoagulated.  HPI     Home Medications Prior to Admission medications   Medication Sig Start Date End Date Taking? Authorizing Provider  ciclopirox (LOPROX) 0.77 % cream Apply topically as directed. Qd to bid to hands, feet, and around nails 05/06/21   Willeen Niece, MD  fluticasone-salmeterol (ADVAIR DISKUS) 250-50 MCG/ACT AEPB Inhale 1 puff into the lungs in the morning and at bedtime. 01/17/21   Danelle Berry, PA-C  hydroxychloroquine (PLAQUENIL) 200 MG tablet Take 200 mg by mouth 2 (two) times daily. 09/19/20   [provider]  omeprazole (PRILOSEC OTC) 20 MG tablet Take 20 mg by mouth every morning.    [provider]      Allergies    Patient has no known allergies.    Review of Systems   Review of Systems  Constitutional:  Positive for fatigue.  Musculoskeletal:  Positive for back pain. Negative for arthralgias.  Neurological:  Positive for syncope.  All other systems reviewed and are negative.  Physical Exam Updated Vital Signs BP 126/80    Pulse 84    Temp 97.9 F (36.6 C)    Resp 11    Ht 5\' 10"  (1.778 m)    Wt 90.7 kg    SpO2 94%  BMI 28.70 kg/m  Physical Exam Vitals and nursing note reviewed.  Constitutional:      General: He is not in acute distress.    Appearance: He is not ill-appearing or toxic-appearing.  HENT:     Head: Normocephalic and atraumatic.     Nose: Nose normal.     Mouth/Throat:     Mouth: Mucous membranes are moist.     Pharynx: No oropharyngeal exudate or posterior oropharyngeal erythema.  Eyes:     General:        Right eye: No discharge.        Left eye: No discharge.     Extraocular Movements: Extraocular movements intact.     Conjunctiva/sclera: Conjunctivae normal.     Pupils: Pupils are equal, round, and reactive to light.  Neck:     Trachea:  Trachea and phonation normal.  Cardiovascular:     Rate and Rhythm: Normal rate and regular rhythm.     Pulses: Normal pulses.     Heart sounds: Normal heart sounds. No murmur heard. Pulmonary:     Effort: Pulmonary effort is normal. No tachypnea, accessory muscle usage or respiratory distress.     Breath sounds: Normal breath sounds. No wheezing or rales.  Abdominal:     General: Bowel sounds are normal. There is no distension.     Palpations: Abdomen is soft.     Tenderness: There is no abdominal tenderness. There is no guarding or rebound.  Musculoskeletal:        General: No deformity.     Cervical back: Normal range of motion and neck supple.     Right lower leg: No edema.     Left lower leg: No edema.  Lymphadenopathy:     Cervical: No cervical adenopathy.  Skin:    General: Skin is warm and dry.     Capillary Refill: Capillary refill takes less than 2 seconds.  Neurological:     Mental Status: He is alert. Mental status is at baseline.     GCS: GCS eye subscore is 4. GCS verbal subscore is 5. GCS motor subscore is 6.     Cranial Nerves: Cranial nerves 2-12 are intact.     Sensory: Sensation is intact.     Motor: Motor function is intact.     Gait: Gait is intact.  Psychiatric:        Mood and Affect: Mood normal.    ED Results / Procedures / Treatments   Labs (all labs ordered are listed, but only abnormal results are displayed) Labs Reviewed  CBC WITH DIFFERENTIAL/PLATELET - Abnormal; Notable for the following components:      Result Value   Neutro Abs 7.9 (*)    All other components within normal limits  COMPREHENSIVE METABOLIC PANEL - Abnormal; Notable for the following components:   Glucose, Bld 162 (*)    Total Bilirubin 0.2 (*)    All other components within normal limits  ACETAMINOPHEN LEVEL - Abnormal; Notable for the following components:   Acetaminophen (Tylenol), Serum <10 (*)    All other components within normal limits  ETHANOL - Abnormal; Notable  for the following components:   Alcohol, Ethyl (B) 11 (*)    All other components within normal limits  RAPID URINE DRUG SCREEN, HOSP PERFORMED - Abnormal; Notable for the following components:   Tetrahydrocannabinol POSITIVE (*)    All other components within normal limits  RESP PANEL BY RT-PCR (FLU A&B, COVID) ARPGX2  SALICYLATE LEVEL  MAGNESIUM  LIPASE, BLOOD  TROPONIN I (HIGH SENSITIVITY)  TROPONIN I (HIGH SENSITIVITY)    EKG None  Radiology DG Chest Portable 1 View  Result Date: 08/12/2021 CLINICAL DATA:  Found down at home with agonal breathing EXAM: PORTABLE CHEST 1 VIEW COMPARISON:  11/15/2020 FINDINGS: The heart size and mediastinal contours are within normal limits. Both lungs are clear. The visualized skeletal structures are unremarkable. Chronic left sixth rib fracture. IMPRESSION: No active disease. Electronically Signed   By: Jasmine Pang M.D.   On: 08/12/2021 19:56    Procedures Procedures    Medications Ordered in ED Medications - No data to display  ED Course/ Medical Decision Making/ A&P                           Medical Decision Making 56 year old Male presents after accidental overdose today.  Vital signs are normal and intake.  Cardiopulmonary exam, abdominal exam is benign.  Patient is neurovascular intact in all 4 extremities and his neurologic exam is without focal deficit.  He does have IV access in the left cubital space.   Amount and/or Complexity of Data Reviewed Labs: ordered.    Details: CBC unremarkable, CMP unremarkable.  Lipase is normal, troponin is normal, 13.  Magnesium is normal, RVP is negative.  Acetaminophen level is salicylate levels are within normal range.  Alcohol level is mildly elevated to 11.  UDS is positive for THC. Radiology: ordered.    Details: Chest x-ray negative for acute cardiopulmonary disease ECG/medicine tests:     Details: EKG with sinus rhythm without STEMI or QT or QRS prolongation.    Clinical picture most  consistent with opiate overdose having responded well to Narcan, however pattern was unknown substance and UDS did not reveal any opiates.  It is possible he did have a synthetic opiate that would not be detected on UDS.  Clinical picture at this time is reassuring with normal vital signs throughout his stay in the emergency department without any evidence of respiratory depression as well as reassuring physical exam and work-up.  Clinical concern for an emergent issue underlying would warrant inpatient management is quite low.  Demetri voiced understanding for medical evaluation and treatment plan.  Extensive discussion with the patient regarding the dangers of utilizing drugs particularly when they are not unknown substance.  Patient is agreeable to contacting his pain management service so that pursue his back pain and a more safe manner.  Strict return precautions were given.  Peter [name] is well-appearing, stable, and was discharged in good condition.  This chart was dictated using voice recognition software, Dragon. Despite the best efforts of this provider to proofread and correct errors, errors may still occur which can change documentation meaning.     Final Clinical Impression(s) / ED Diagnoses Final diagnoses:  None    Rx / DC Orders ED Discharge Orders     None         Sherrilee Gilles 08/13/21 1333    Lorre Nick, MD 08/14/21 2248

## 2021-08-12 NOTE — ED Notes (Addendum)
Pt reports has hx of chronic back pain, "a friend" came to visit him at job today and gave him pain medication prior to episode. Pt states he does not know what medication was.

## 2021-08-12 NOTE — Discharge Instructions (Signed)
You are seen in the ER today after your overdose.  Work-up is reassuring.  Please do not take any drugs off the street especially when you do not know what they are.  Please only take over-the-counter medications in accordance with the instructions on the box.  Please follow-up with your pain management clinic and return to the ER with any new severe symptoms

## 2021-08-12 NOTE — ED Triage Notes (Signed)
Pt arrived via EMS, reports found down at home. PD started chest compressions on pt. Pulse and agonal breathing with EMS. 2.5mg  IN narcan given. Alert and Oriented on arrival.

## 2021-08-12 NOTE — ED Notes (Signed)
Pt reports taking 8 BC packets throughout the day for chronic lower back pain. Pt does not remember the events leading up to his syncopal episode. Drowsy on assessment, though answering all questions appropriately. No complaints other than lower back pain.

## 2021-08-25 ENCOUNTER — Other Ambulatory Visit: Payer: Self-pay

## 2021-08-25 ENCOUNTER — Telehealth: Payer: Self-pay | Admitting: Acute Care

## 2021-08-25 NOTE — Telephone Encounter (Signed)
Left voicemail with call back number to call for scheduling of LDCT

## 2021-11-04 NOTE — Progress Notes (Signed)
Patient has been out of work since 08/2021 was let go. He is having extreme pain in his lower back down to right side down back of leg to the bottom of foot to toes. States feet felling numb. Would like to have a procedure for this. ?

## 2021-11-04 NOTE — Progress Notes (Signed)
Patient: Peter [name]  Service Category: E/M  Provider: Oswaldo Done, MD  ?DOB: [birth year]  DOS: 11/05/2021  Location: Office  ?MRN: [id number]  Setting: Ambulatory outpatient  Referring Provider: [facility]  ?Type: Established Patient  Specialty: Interventional Pain Management  PCP: [facility]  ?Location: Remote location  Delivery: TeleHealth    ? ?Virtual Encounter - Pain Management ?PROVIDER NOTE: Information contained herein reflects review and annotations entered in association with encounter. Interpretation of such information and data should be left to medically-trained personnel. Information provided to patient can be located elsewhere in the medical record under "Patient Instructions". Document created using STT-dictation technology, any transcriptional errors that may result from process are unintentional.  ?  ?Contact & Pharmacy ?Preferred: [phone] ?Home: [phone] (home) ?Mobile: [phone] (mobile) ?E-mail: No e-mail address on record  ?[id number] - [place], [place] - [address] ?[address] ?[place] [address] ?Phone: [phone] Fax: [phone] ? ?[facility] - [place] [address] ?[address] ?[address] ?[place] [address] ?Phone: [phone] Fax: [phone] ?  ?Pre-screening  ?Peter [name] offered "in-person" vs "virtual" encounter. He indicated preferring virtual for this encounter.  ? ?Reason ?COVID-19*  Social distancing based on CDC and AMA recommendations.  ? ?I contacted [employer] on 11/05/2021 via telephone.      I clearly identified myself as Oswaldo Done, MD. I verified that I was speaking with the correct person using two identifiers (Name: Peter [name], and date of birth: [birth year]). ? ?Consent ?I sought verbal advanced consent from Peter [name] for virtual visit interactions. I informed Peter [name] of possible security and privacy concerns, risks, and limitations  associated with providing "not-in-person" medical evaluation and management services. I also informed Peter [name] of the availability of "in-person" appointments. Finally, I informed him that there would be a charge for the virtual visit and that he could be  personally, fully or partially, financially responsible for it. Peter [name] expressed understanding and agreed to proceed.  ? ?Historic Elements   ?Peter [name] is a 56 y.o. year old, male patient evaluated today after our last contact on 07/25/2021. Peter [name]  has a past medical history of Arthritis, COPD (chronic obstructive pulmonary disease) (HCC), Diverticulitis, Generalized headaches, GERD (gastroesophageal reflux disease), History of colonoscopy with polypectomy (09/08/2018), Lower back pain, Rectal pain, and Thrombosed hemorrhoids. He also  has a past surgical history that includes Cholecystectomy (1989); Tonsillectomy (1970); Appendectomy (1988); Colonoscopy with propofol (N/A, 08/16/2015); polypectomy (08/16/2015); Laparoscopic partial colectomy (N/A, 09/11/2015); Cystoscopy with stent placement (Bilateral, 09/11/2015); Bladder repair (N/A, 09/11/2015); Ileostomy closure (N/A, 11/19/2015); Colon surgery; Anterior cervical decompression/discectomy fusion 4 level (N/A, 02/15/2019); and Colonoscopy (N/A, 02/14/2021). Peter [name] has a current medication list which includes the following prescription(s): ciclopirox, fluticasone-salmeterol, hydroxychloroquine, and omeprazole. He  reports that he has been smoking cigarettes. He has a 42.00 pack-year smoking history. He has never used smokeless tobacco. He reports current alcohol use. He reports current drug use. Drug: Marijuana. Peter [name] has No Known Allergies.  ? ?HPI  ?Today, he is being contacted for worsening of previously known (established) problem.  The patient refers having recurrence of his low back pain and lower extremity pain.  At this time he refers that the lower extremity pain is worse than the  low back pain.  In the case of the lower extremity pain it seems to be affecting primarily the right leg with the  pain going all the way down into his foot.  He refers that he is experiencing pain and numbness around the entire foot.  He refers that he would like to come in for an injection to get this pain under control.  He had recently started a new job but he was let go because he was unable to perform it secondary to the pain.  According to a recent MRI done on 07/26/2020 the patient has multiple problems in the lumbar spine.  Things that are primarily affecting the right side include an L2-3 disc bulge with a superimposed right subarticular extraforaminal disc protrusion with slight inferior migration under the right lateral recess.  At the L5-S1 level there is an anterolisthesis with a right foraminal disc protrusion causing severe right facet degeneration and L5 foraminal narrowing.  Based on these results on the patient's description of his symptoms, I will be scheduling him to come in for MME/day right-sided L2-3 LESI along with a right L4 and L5 TFESI. ? ?Pharmacotherapy Assessment  ? ?Opioid Analgesic: Hydrocodone/APAP 5/325 1 tab p.o. twice daily (10/01/2020). NO OPIOIDS - Abnormal UDS (11/04/2020): Positive for marijuana ?MME/day: 10 mg/day  ? ?Monitoring: ?[place] PMP: PDMP reviewed during this encounter.       ?Pharmacotherapy: No side-effects or adverse reactions reported. ?Compliance: No problems identified. ?Effectiveness: Clinically acceptable. ?Plan: Refer to "POC". UDS:  ?Summary  ?Date Value Ref Range Status  ?11/04/2020 Note  Final  ?  Comment:  ?  ==================================================================== ?Compliance Drug Analysis, Ur ?==================================================================== ?Test                             Result       Flag       Units ? ?Drug Present not Declared for Prescription Verification ?  Carboxy-THC                    90           UNEXPECTED ng/mg  creat ?   Carboxy-THC is a metabolite of tetrahydrocannabinol (THC). Source of ?   THC is most commonly herbal marijuana or marijuana-based products, ?   but THC is also present in a scheduled prescription medication. ?   Trace amounts of THC can be present in hemp and cannabidiol (CBD) ?   products. This test is not intended to distinguish between delta-9- ?   tetrahydrocannabinol, the predominant form of THC in most herbal or ?   marijuana-based products, and delta-8-tetrahydrocannabinol. ? ?  Gabapentin                     PRESENT      UNEXPECTED ?  Salicylate                     PRESENT      UNEXPECTED ?==================================================================== ?Test                      Result    Flag   Units      Ref Range ?  Creatinine              84               mg/dL      >=76 ?==================================================================== ?Declared Medications: ? The flagging and interpretation on this report are based on the ? following declared medications.  Unexpected results may arise from ? inaccuracies in the  declared medications. ? ? **Note: The testing scope of this panel does not include the ? following reported medications: ? ? Ciclopirox (Loprox) ? Fluticasone (Advair) ? Hydroxychloroquine (Plaquenil) ? Omeprazole (Prilosec) ? Salmeterol (Advair) ?==================================================================== ?For clinical consultation, please call [phone]. ?==================================================================== ?  ?  ? ?Laboratory Chemistry Profile  ? ?Renal ?Lab Results  ?Component Value Date  ? BUN 18 08/12/2021  ? CREATININE 1.21 08/12/2021  ? BCR 17 01/14/2021  ? GFRAA 92 02/02/2020  ? GFRNONAA >60 08/12/2021  ?  Hepatic ?Lab Results  ?Component Value Date  ? AST 26 08/12/2021  ? ALT 15 08/12/2021  ? ALBUMIN 4.6 08/12/2021  ? ALKPHOS 38 08/12/2021  ? LIPASE 12 08/12/2021  ?  ?Electrolytes ?Lab Results  ?Component Value Date  ? NA 141 08/12/2021   ? K 4.0 08/12/2021  ? CL 105 08/12/2021  ? CALCIUM 9.0 08/12/2021  ? MG 2.1 08/12/2021  ? PHOS 4.2 09/22/2015  ?  Bone ?Lab Results  ?Component Value Date  ? 25OHVITD1 22 (L) 11/04/2020  ? 25OHVITD2 <1.0 05/

## 2021-11-05 ENCOUNTER — Ambulatory Visit: Payer: 59 | Attending: Pain Medicine | Admitting: Pain Medicine

## 2021-11-05 DIAGNOSIS — M47816 Spondylosis without myelopathy or radiculopathy, lumbar region: Secondary | ICD-10-CM

## 2021-11-05 DIAGNOSIS — G894 Chronic pain syndrome: Secondary | ICD-10-CM | POA: Diagnosis not present

## 2021-11-05 DIAGNOSIS — R937 Abnormal findings on diagnostic imaging of other parts of musculoskeletal system: Secondary | ICD-10-CM

## 2021-11-05 DIAGNOSIS — M542 Cervicalgia: Secondary | ICD-10-CM | POA: Diagnosis not present

## 2021-11-05 DIAGNOSIS — M545 Low back pain, unspecified: Secondary | ICD-10-CM

## 2021-11-05 DIAGNOSIS — G8929 Other chronic pain: Secondary | ICD-10-CM

## 2021-11-05 DIAGNOSIS — M48061 Spinal stenosis, lumbar region without neurogenic claudication: Secondary | ICD-10-CM

## 2021-11-05 DIAGNOSIS — M79604 Pain in right leg: Secondary | ICD-10-CM

## 2021-11-05 DIAGNOSIS — M79602 Pain in left arm: Secondary | ICD-10-CM

## 2021-11-05 DIAGNOSIS — M25511 Pain in right shoulder: Secondary | ICD-10-CM | POA: Diagnosis not present

## 2021-11-05 DIAGNOSIS — Z9889 Other specified postprocedural states: Secondary | ICD-10-CM

## 2021-11-05 DIAGNOSIS — G8928 Other chronic postprocedural pain: Secondary | ICD-10-CM

## 2021-11-05 DIAGNOSIS — M25561 Pain in right knee: Secondary | ICD-10-CM

## 2021-11-05 DIAGNOSIS — M25531 Pain in right wrist: Secondary | ICD-10-CM

## 2021-11-05 DIAGNOSIS — Z7901 Long term (current) use of anticoagulants: Secondary | ICD-10-CM | POA: Insufficient documentation

## 2021-11-05 DIAGNOSIS — M25562 Pain in left knee: Secondary | ICD-10-CM

## 2021-11-05 DIAGNOSIS — M5416 Radiculopathy, lumbar region: Secondary | ICD-10-CM | POA: Insufficient documentation

## 2021-11-05 DIAGNOSIS — M25512 Pain in left shoulder: Secondary | ICD-10-CM

## 2021-11-05 DIAGNOSIS — G4486 Cervicogenic headache: Secondary | ICD-10-CM

## 2021-11-05 DIAGNOSIS — M79601 Pain in right arm: Secondary | ICD-10-CM

## 2021-11-05 DIAGNOSIS — M25532 Pain in left wrist: Secondary | ICD-10-CM

## 2021-12-02 ENCOUNTER — Ambulatory Visit: Payer: 59 | Admitting: Pain Medicine

## 2022-02-18 ENCOUNTER — Encounter (HOSPITAL_COMMUNITY): Payer: Self-pay

## 2022-02-18 ENCOUNTER — Emergency Department (HOSPITAL_COMMUNITY): Payer: 59

## 2022-02-18 ENCOUNTER — Emergency Department (HOSPITAL_COMMUNITY)
Admission: EM | Admit: 2022-02-18 | Discharge: 2022-02-18 | Disposition: A | Discharge status: Home / Self Care | Payer: Worker's Compensation | Attending: Emergency Medicine | Admitting: Emergency Medicine

## 2022-02-18 DIAGNOSIS — Z23 Encounter for immunization: Secondary | ICD-10-CM | POA: Insufficient documentation

## 2022-02-18 DIAGNOSIS — Y9389 Activity, other specified: Secondary | ICD-10-CM | POA: Diagnosis not present

## 2022-02-18 DIAGNOSIS — W231XXA Caught, crushed, jammed, or pinched between stationary objects, initial encounter: Secondary | ICD-10-CM | POA: Diagnosis not present

## 2022-02-18 DIAGNOSIS — S61211A Laceration without foreign body of left index finger without damage to nail, initial encounter: Secondary | ICD-10-CM | POA: Insufficient documentation

## 2022-02-18 DIAGNOSIS — S6992XA Unspecified injury of left wrist, hand and finger(s), initial encounter: Secondary | ICD-10-CM | POA: Diagnosis present

## 2022-02-18 MED ORDER — TETANUS-DIPHTH-ACELL PERTUSSIS 5-2.5-18.5 LF-MCG/0.5 IM SUSY
0.5000 mL | PREFILLED_SYRINGE | Freq: Once | INTRAMUSCULAR | Status: AC
  Administered 2022-02-18: 0.5 mL via INTRAMUSCULAR
  Filled 2022-02-18: qty 0.5

## 2022-02-18 MED ORDER — IBUPROFEN 600 MG PO TABS: 600.0000 mg | ORAL_TABLET | Freq: Four times a day (QID) | ORAL | 0 refills | Status: DC | PRN

## 2022-02-18 MED ORDER — TRAMADOL HCL 50 MG PO TABS: 50.0000 mg | ORAL_TABLET | Freq: Three times a day (TID) | ORAL | 0 refills | Status: DC | PRN

## 2022-02-18 MED ORDER — IBUPROFEN 200 MG PO TABS
600.0000 mg | ORAL_TABLET | Freq: Once | ORAL | Status: AC
Start: 2022-02-18 — End: 2022-02-18
  Administered 2022-02-18: 600 mg via ORAL
  Filled 2022-02-18: qty 3

## 2022-02-18 MED ORDER — OXYCODONE-ACETAMINOPHEN 5-325 MG PO TABS
1.0000 | ORAL_TABLET | Freq: Once | ORAL | Status: AC
  Administered 2022-02-18: 1 via ORAL
  Filled 2022-02-18: qty 1

## 2022-02-18 MED ORDER — DOXYCYCLINE HYCLATE 100 MG PO CAPS: 100.0000 mg | ORAL_CAPSULE | Freq: Two times a day (BID) | ORAL | 0 refills | Status: AC

## 2022-02-18 MED ORDER — DOXYCYCLINE HYCLATE 100 MG PO TABS
100.0000 mg | ORAL_TABLET | Freq: Once | ORAL | Status: AC
Start: 2022-02-18 — End: 2022-02-18
  Administered 2022-02-18: 100 mg via ORAL
  Filled 2022-02-18: qty 1

## 2022-02-18 NOTE — ED Provider Notes (Signed)
[facility]-EMERGENCY DEPT Provider Note   CSN: [id number] Arrival date & time: 02/18/22  1205     History  Chief Complaint  Patient presents with   Finger Injury    Peter [name] is a 56 y.o. male presenting to the ED with injury to his left distal fingertip.  The patient reports that he got his finger smashed between the edge of the toilet in a dumpster.  He is left-handed.  No other injuries reported.  He did go to urgent care and was referred into the ED.  He reports he did irrigate his wound at home.  His tetanus is not up-to-date or is not certain of last tetanus.  HPI     Home Medications Prior to Admission medications   Medication Sig Start Date End Date Taking? Authorizing Provider  doxycycline (VIBRAMYCIN) 100 MG capsule Take 1 capsule (100 mg total) by mouth 2 (two) times daily for 7 days. 02/18/22 02/25/22 Yes Heli Dino, Kermit Balo, MD  ibuprofen (ADVIL) 600 MG tablet Take 1 tablet (600 mg total) by mouth every 6 (six) hours as needed for up to 30 doses for mild pain or moderate pain. 02/18/22  Yes Theodis Kinsel, Kermit Balo, MD  traMADol (ULTRAM) 50 MG tablet Take 1 tablet (50 mg total) by mouth every 8 (eight) hours as needed for up to 12 doses. 02/18/22  Yes Terald Sleeper, MD  ciclopirox (LOPROX) 0.77 % cream Apply topically as directed. Qd to bid to hands, feet, and around nails 05/06/21   Willeen Niece, MD  fluticasone-salmeterol (ADVAIR DISKUS) 250-50 MCG/ACT AEPB Inhale 1 puff into the lungs in the morning and at bedtime. 01/17/21   Danelle Berry, PA-C  hydroxychloroquine (PLAQUENIL) 200 MG tablet Take 200 mg by mouth 2 (two) times daily. 09/19/20   [provider]  omeprazole (PRILOSEC OTC) 20 MG tablet Take 20 mg by mouth every morning.    [provider]      Allergies    Patient has no known allergies.    Review of Systems   Review of Systems  Physical Exam Updated Vital Signs BP (!) 146/105 (BP Location: Right Arm)   Pulse 80    Temp 98.2 F (36.8 C) (Oral)   Resp 18   Ht 5\' 10"  (1.778 m)   Wt 102.1 kg   SpO2 96%   BMI 32.28 kg/m  Physical Exam Constitutional:      General: He is not in acute distress. HENT:     Head: Normocephalic and atraumatic.  Eyes:     Conjunctiva/sclera: Conjunctivae normal.     Pupils: Pupils are equal, round, and reactive to light.  Cardiovascular:     Rate and Rhythm: Normal rate and regular rhythm.  Pulmonary:     Effort: Pulmonary effort is normal. No respiratory distress.  Skin:    General: Skin is warm and dry.     Comments: Open skin avulsion injury involving approximately half a centimeter of the distal left fingertip, base of the nailbed appears intact, no subungual hematoma, small amount of visible underlying bone,  Neurological:     General: No focal deficit present.     Mental Status: He is alert. Mental status is at baseline.  Psychiatric:        Mood and Affect: Mood normal.        Behavior: Behavior normal.     ED Results / Procedures / Treatments   Labs (all labs ordered are listed, but only abnormal results are  displayed) Labs Reviewed - No data to display  EKG None  Radiology DG Finger Index Left  Result Date: 02/18/2022 CLINICAL DATA:  Trauma EXAM: LEFT INDEX FINGER 2+V COMPARISON:  X-ray left hand 04/26/2018 FINDINGS: Query poorly visualized 4 mm fracture fragment along the base of the second digit distal phalanx versus degenerative changes. No dislocation. There is no evidence of arthropathy or other focal bone abnormality. Distal second digit soft tissue defect. Linear 2 mm density along the dorsal soft tissue of the second digit at the level of the middle phalanx. IMPRESSION: 1. Query poorly visualized 4 mm fracture fragment along the base of the second digit distal phalanx versus degenerative changes. 2. Distal second digit soft tissue defect with associated linear 2 mm density (possible retained foreign body) along the dorsal soft tissue of the  second digit at the level of the middle phalanx. Electronically Signed   By: Tish Frederickson M.D.   On: 02/18/2022 15:15    Procedures Procedures    Medications Ordered in ED Medications  Tdap (BOOSTRIX) injection 0.5 mL (has no administration in time range)  oxyCODONE-acetaminophen (PERCOCET/ROXICET) 5-325 MG per tablet 1 tablet (has no administration in time range)  ibuprofen (ADVIL) tablet 600 mg (has no administration in time range)  doxycycline (VIBRA-TABS) tablet 100 mg (has no administration in time range)    ED Course/ Medical Decision Making/ A&P                           Medical Decision Making Amount and/or Complexity of Data Reviewed Radiology: ordered.  Risk OTC drugs. Prescription drug management.   Patient is here with an avulsion and crush type injury to his left distal fingertip.  The distal fingertip is not viable for reattachment.  X-rays ordered and personally viewed interpreted, questioning a tuft fracture of the distal bone.  I think is reasonable to treat this as an open fracture, put him on doxycycline for 7 days and have him follow-up with orthopedic hand surgeon.  In the meantime we copiously irrigated his wound.  We applied a quick clot powder over the wound, which was gaping, as it was not amenable to suturing.  The wound was subsequently hemostatic.  Updated the patient's tetanus as well.  He was given some pain medicine.  Okay for discharge.        Final Clinical Impression(s) / ED Diagnoses Final diagnoses:  Laceration of left index finger without damage to nail, foreign body presence unspecified, initial encounter    Rx / DC Orders ED Discharge Orders          Ordered    doxycycline (VIBRAMYCIN) 100 MG capsule  2 times daily        02/18/22 1524    traMADol (ULTRAM) 50 MG tablet  Every 8 hours PRN        02/18/22 1524    ibuprofen (ADVIL) 600 MG tablet  Every 6 hours PRN        02/18/22 1524              Terald Sleeper,  MD 02/18/22 1524

## 2022-02-18 NOTE — Discharge Instructions (Signed)
Please keep the wound clean and dry for the next 2 weeks.  You should not use your left hand for any activities.  Please call the hand surgeon's office to schedule follow-up appointment.

## 2022-02-18 NOTE — ED Triage Notes (Signed)
Pt states he was throwing some toilets in the dumpster and one of them smashed his finger. Tip of left index finger was cut off, did not involve the nail. Bleeding is controlled at this time.

## 2022-04-25 IMAGING — CR DG WRIST COMPLETE 3+V*R*
4 series · 4 of 4 positions shown · non-contrast
Comparison: None.

CLINICAL DATA: Chronic bilateral wrist pain

EXAM:
LEFT WRIST - COMPLETE 3+ VIEW; RIGHT WRIST - COMPLETE 3+ VIEW

[wrist pa]
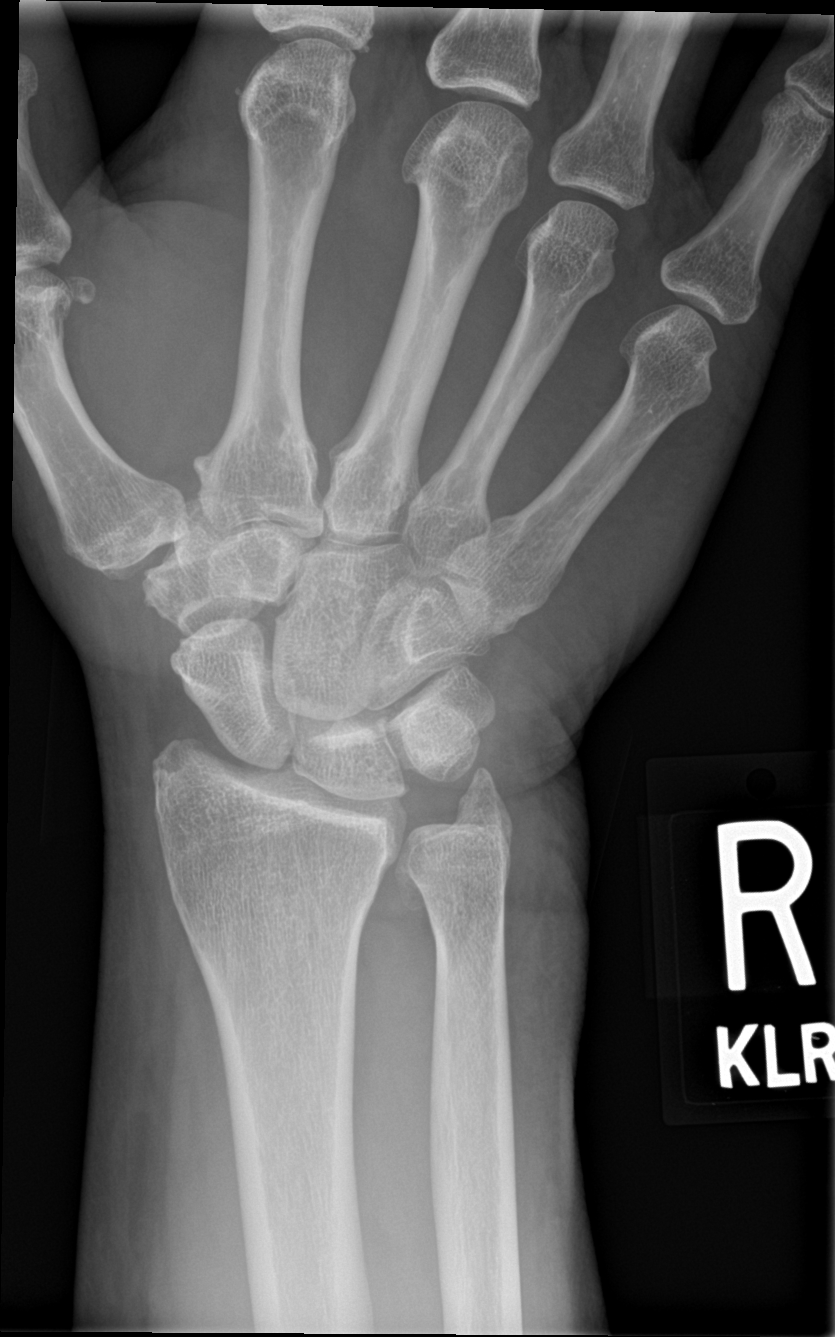

[wrist obl]
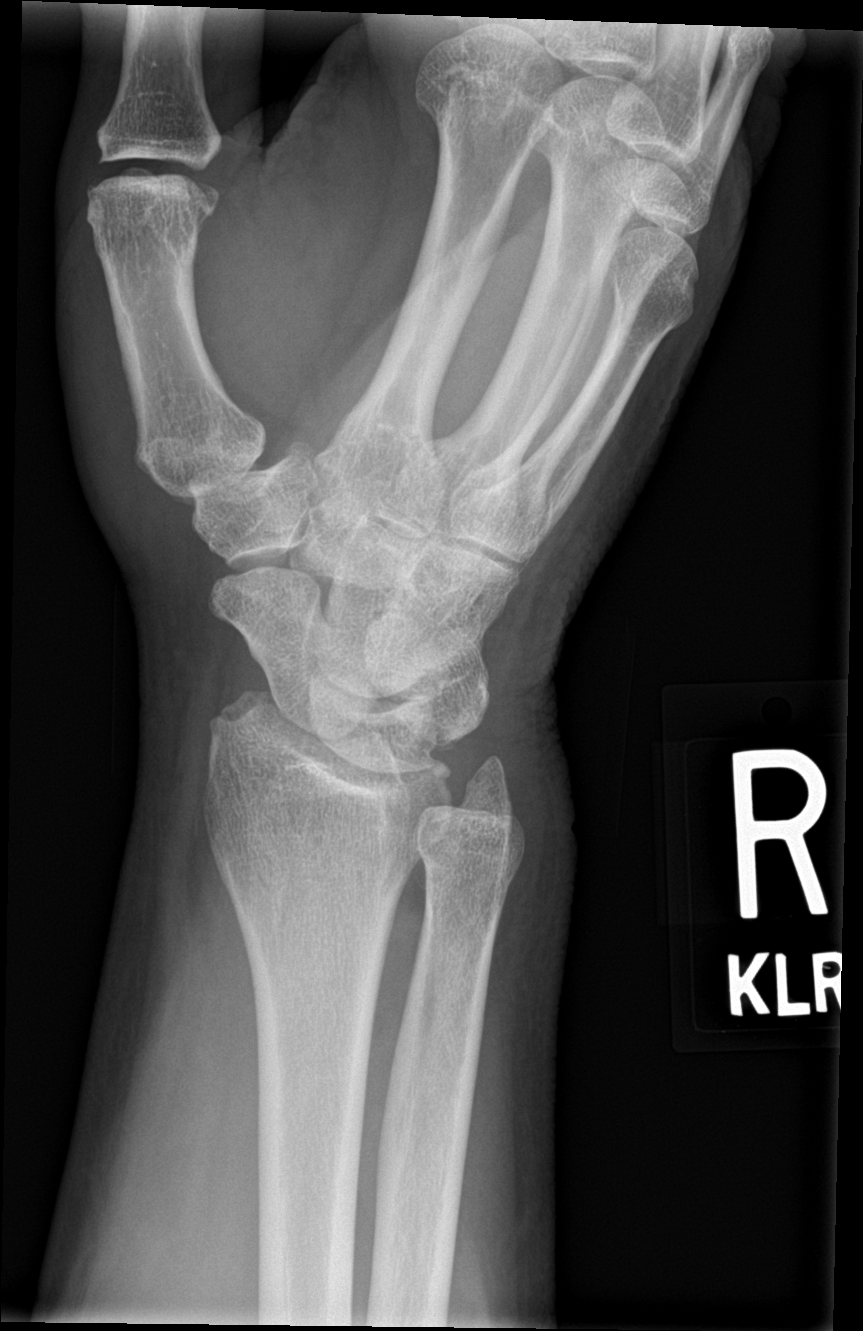

[wrist lat]
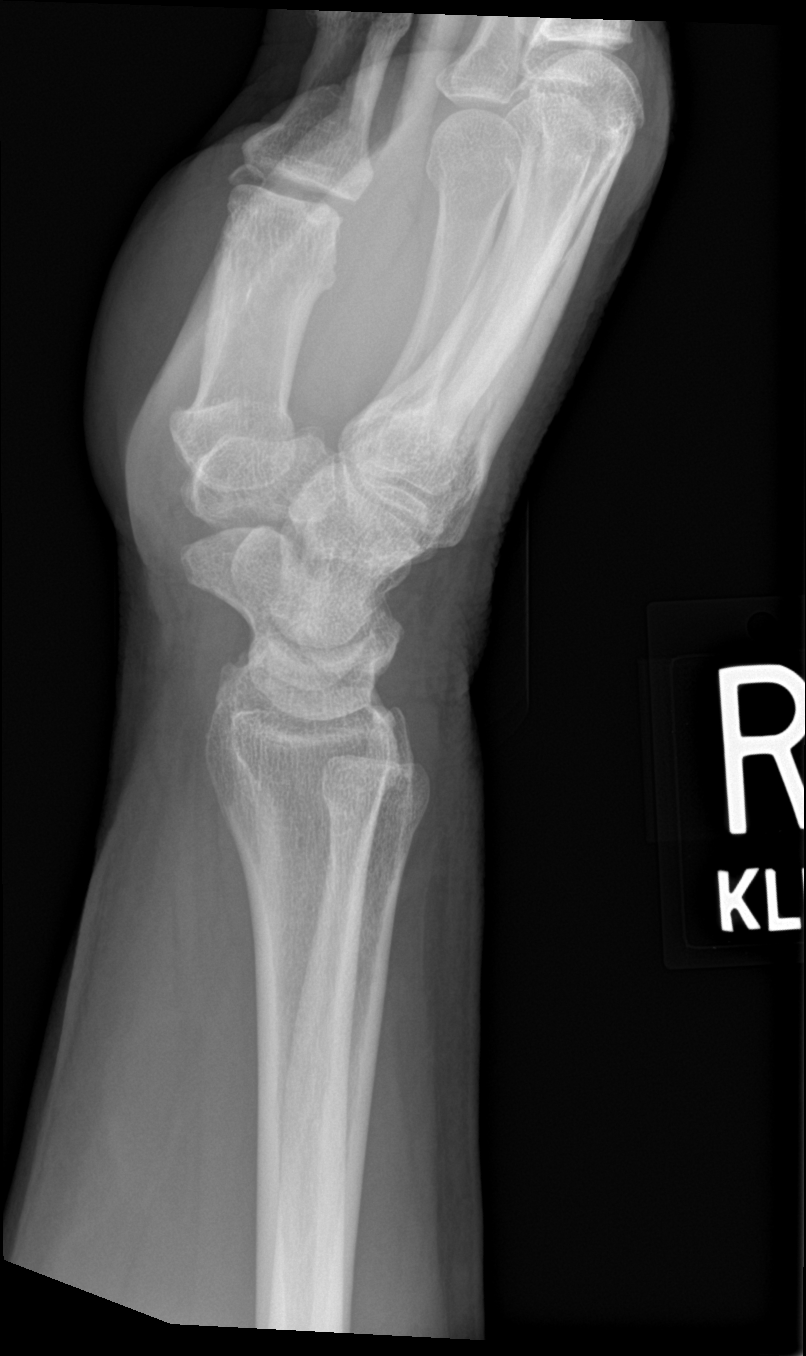

[navicular]
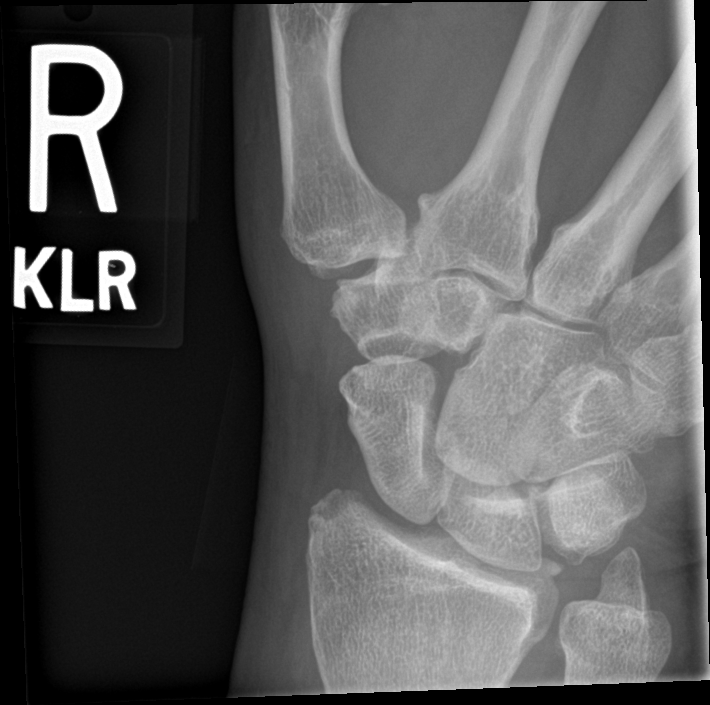

[4 of 4 positions shown; findings below may reference images not displayed]

FINDINGS: There is no evidence of fracture or dislocation. Minimal carpal and
radiocarpal arthrosis bilaterally. Soft tissues are unremarkable.
IMPRESSION: 1. No fracture or dislocation of the bilateral wrists. The carpi are
normally aligned.

2.  Minimal carpal and radiocarpal arthrosis bilaterally.

## 2022-04-25 IMAGING — CR DG WRIST COMPLETE 3+V*L*
4 series · 4 of 4 positions shown · non-contrast
Comparison: None.

CLINICAL DATA: Chronic bilateral wrist pain

EXAM:
LEFT WRIST - COMPLETE 3+ VIEW; RIGHT WRIST - COMPLETE 3+ VIEW

[wrist pa]
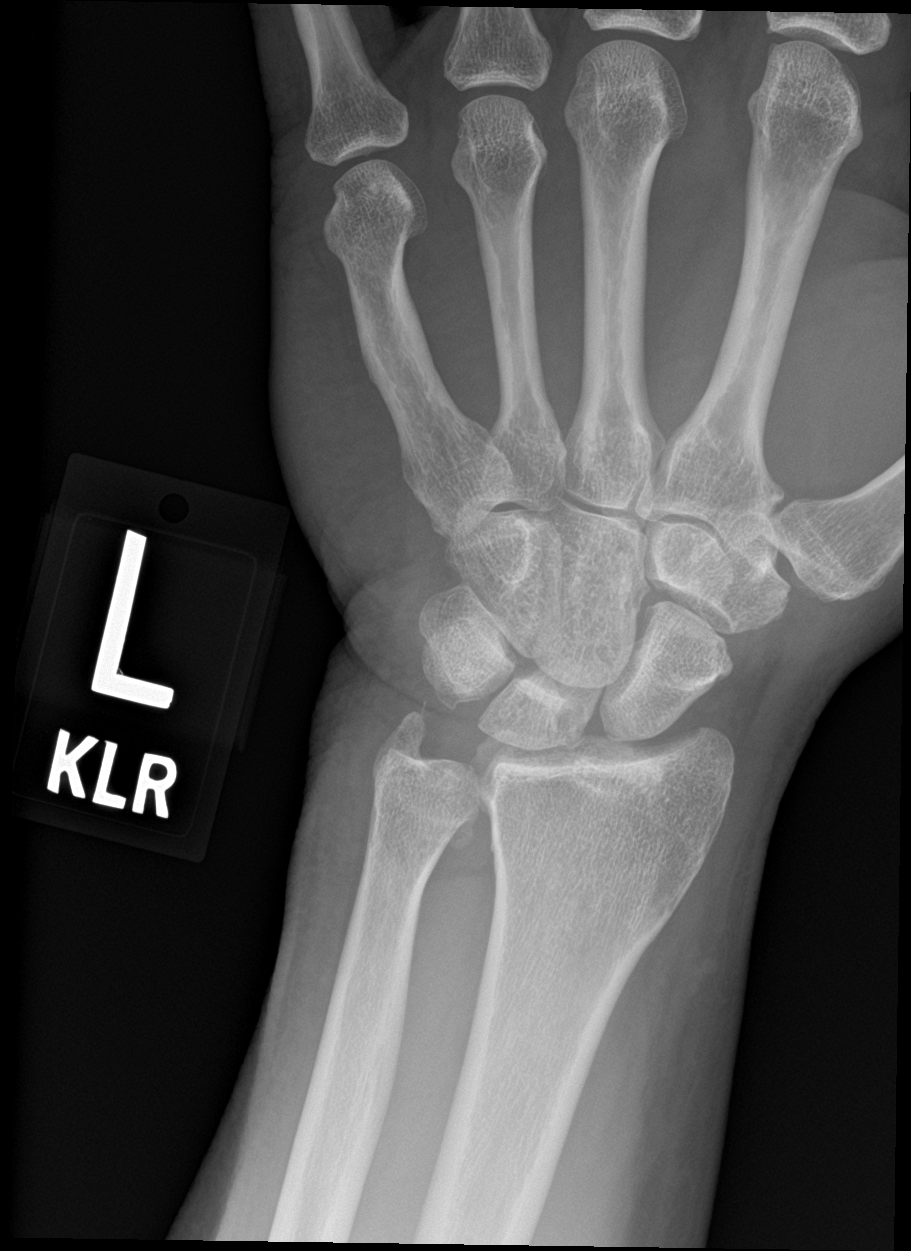

[wrist obl]
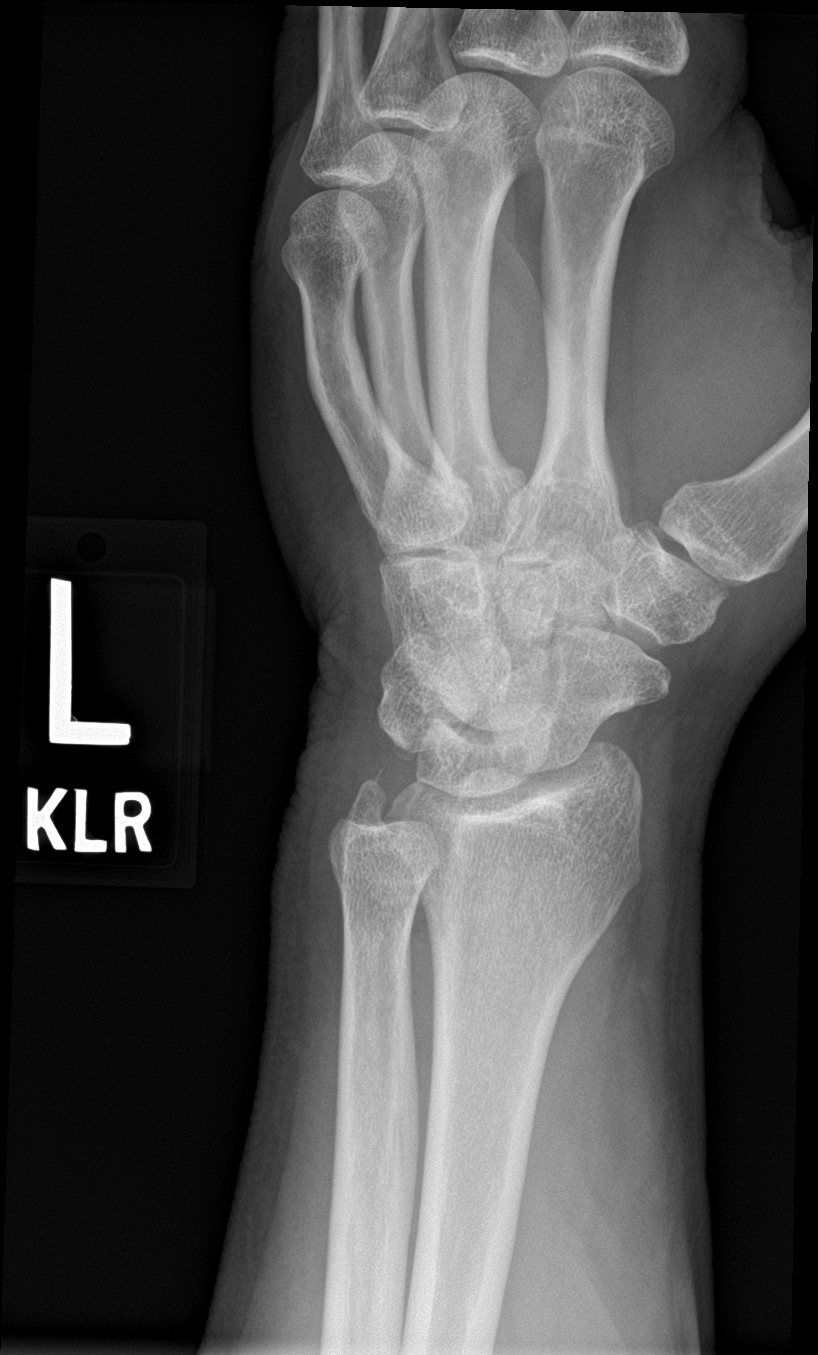

[wrist lat]
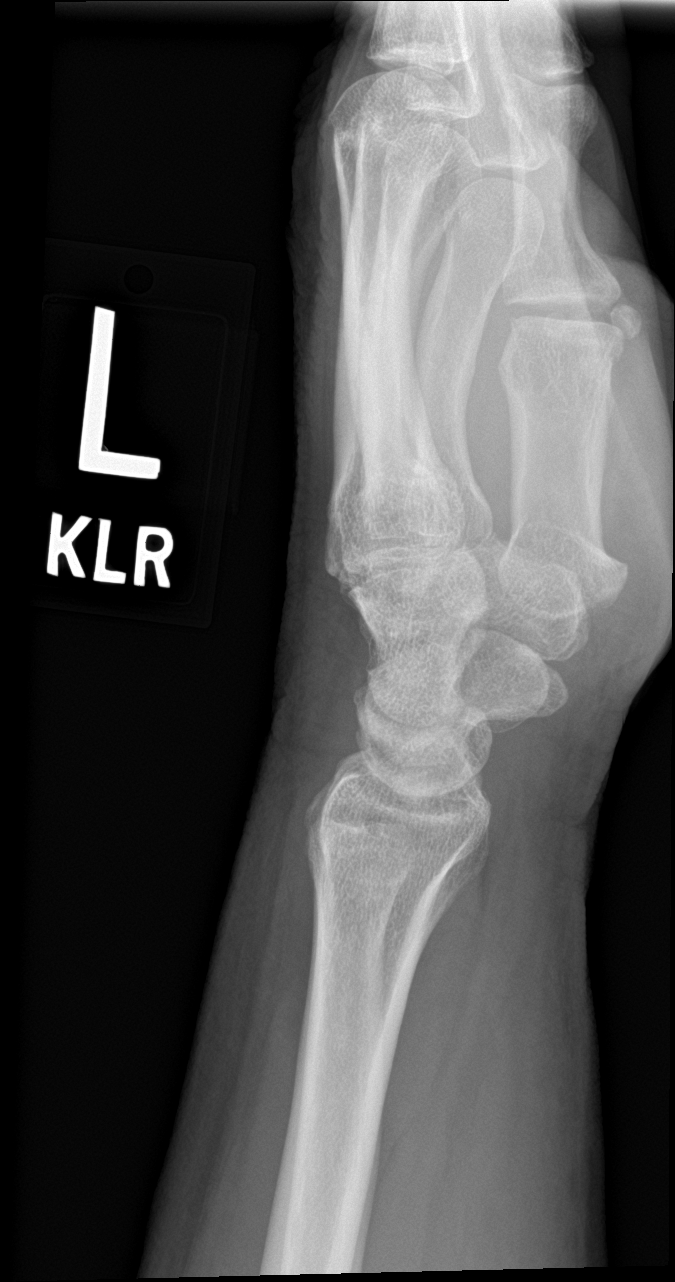

[navicular]
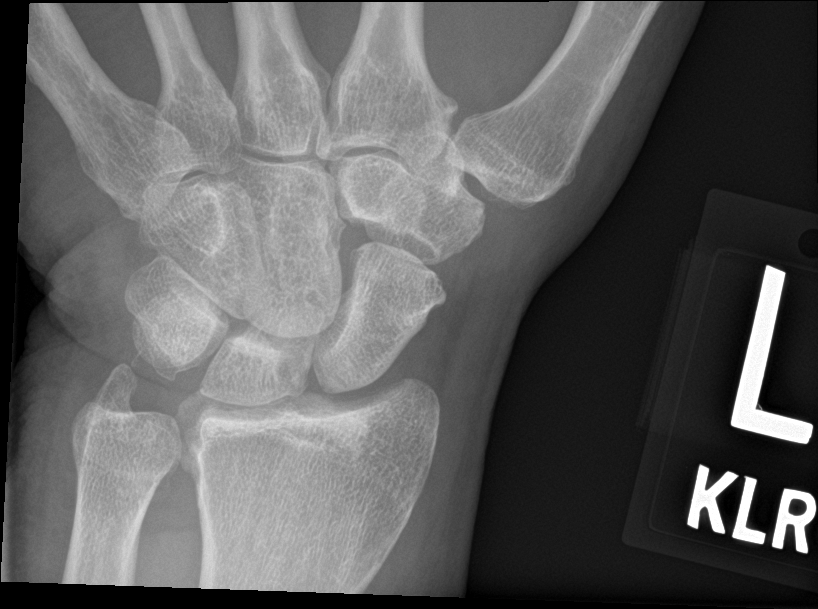

[4 of 4 positions shown; findings below may reference images not displayed]

FINDINGS: There is no evidence of fracture or dislocation. Minimal carpal and
radiocarpal arthrosis bilaterally. Soft tissues are unremarkable.
IMPRESSION: 1. No fracture or dislocation of the bilateral wrists. The carpi are
normally aligned.

2.  Minimal carpal and radiocarpal arthrosis bilaterally.

## 2022-11-26 ENCOUNTER — Ambulatory Visit: Payer: BLUE CROSS/BLUE SHIELD | Admitting: Physician Assistant

## 2022-11-26 ENCOUNTER — Ambulatory Visit: Payer: Self-pay | Admitting: *Deleted

## 2022-11-26 ENCOUNTER — Encounter: Payer: Self-pay | Admitting: Physician Assistant

## 2022-11-26 DIAGNOSIS — J440 Chronic obstructive pulmonary disease with acute lower respiratory infection: Secondary | ICD-10-CM

## 2022-11-26 MED ORDER — FLUTICASONE-SALMETEROL 250-50 MCG/ACT IN AEPB: 1.0000 | INHALATION_SPRAY | Freq: Two times a day (BID) | RESPIRATORY_TRACT | 2 refills | Status: DC

## 2022-11-26 MED ORDER — ALBUTEROL SULFATE HFA 108 (90 BASE) MCG/ACT IN AERS
2.0000 | INHALATION_SPRAY | Freq: Four times a day (QID) | RESPIRATORY_TRACT | 2 refills | Status: DC | PRN
Start: 2022-11-26 — End: 2023-10-25

## 2022-11-26 MED ORDER — AZITHROMYCIN 250 MG PO TABS
ORAL_TABLET | ORAL | 0 refills | Status: DC
Start: 2022-11-26 — End: 2022-12-15

## 2022-11-26 NOTE — Patient Instructions (Addendum)
I have sent in a refill of your Advair inhaler- please use this every day I have sent in a rescue inhaler for you to use as well. Use this when you are feeling short of breath or know that you will be more physically active   I have sent in a script for an antibiotic called Azithromycin ( a Zpack) for you to take for 5 days  Please finish the entire course  You can also start using Mucinex and Robitussin to help with your symptoms  I also recommend adding an antihistamine to your daily regimen This includes medications like Claritin, Allegra, Zyrtec- the generics of these work very well and are usually less expensive I recommend using Flonase nasal spray - 2 puffs twice per day to help with your nasal congestion The antihistamines and Flonase can take a few weeks to provide significant relief from allergy symptoms but should start to provide some benefit soon.  If your symptoms are not improving in the next 5-7 days or if you are feeling worse, please reach out to your PCP and follow up in the office   Please call [facility] and schedule an apt with your PCP so they can do your physical and manage your chronic conditions. If you are not seen regularly you may not be considered an active patient anymore

## 2022-11-26 NOTE — Progress Notes (Signed)
Acute Office Visit   Patient: Peter [name]   DOB: 07/21/1965   57 y.o. Male  MRN: [id number] Visit Date: 11/26/2022  Today's healthcare provider: Oswaldo Conroy Duke Weisensel, PA-C  Introduced myself to the patient as a Secondary school teacher and provided education on APPs in clinical practice.    Chief Complaint  Patient presents with   Cough    Pt states he has been having a cough for the last 2 weeks. States that he does smoke and has COPD. States he has also noticed some SOB as well.    Subjective    HPI HPI     Cough    Additional comments: Pt states he has been having a cough for the last 2 weeks. States that he does smoke and has COPD. States he has also noticed some SOB as well.       Last edited by Pablo Ledger, CMA on 11/26/2022 11:03 AM.      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  He reports roaches are present in most of the apartments  Recent travel: none   Physical activity limitations: yes currently limited to about 300 feet before needing to stop and rest  He is able to climb stairs but feel SOB after reaching the top    Patient has hx of COPD - CXR on 11/15/2020 demonstrates hyperinflation  Unsure if he has been to Pulmonology in the last 3 years- no evidence of this in chart review and he is behind on LDCT screening He is still an active smoker - currently smoking almost a pack per day  He reports some interest in quitting smoking    Medications: Outpatient Medications Prior to Visit  Medication Sig   [DISCONTINUED] fluticasone-salmeterol (ADVAIR DISKUS) 250-50 MCG/ACT AEPB Inhale 1 puff into the lungs in the morning and at bedtime.   [DISCONTINUED] ciclopirox (LOPROX) 0.77 %  cream Apply topically as directed. Qd to bid to hands, feet, and around nails   [DISCONTINUED] hydroxychloroquine (PLAQUENIL) 200 MG tablet Take 200 mg by mouth 2 (two) times daily.   [DISCONTINUED] ibuprofen (ADVIL) 600 MG tablet Take 1 tablet (600 mg total) by mouth every 6 (six) hours as needed for up to 30 doses for mild pain or moderate pain.   [DISCONTINUED] omeprazole (PRILOSEC OTC) 20 MG tablet Take 20 mg by mouth every morning.   [DISCONTINUED] traMADol (ULTRAM) 50 MG tablet Take 1 tablet (50 mg total) by mouth every 8 (eight) hours as needed for up to 12 doses.   No facility-administered medications prior to visit.    Review of Systems  Constitutional:  Positive for fatigue. Negative for chills and fever.  HENT:  Positive for congestion, rhinorrhea, sinus pressure and sinus pain. Negative for ear pain and sore throat.   Respiratory:  Positive for cough, shortness of breath and wheezing.   Gastrointestinal:  Positive for diarrhea and nausea. Negative for vomiting.  Musculoskeletal:  Positive for myalgias.  Neurological:  Positive for headaches.       Objective    BP 131/79   Pulse 99   Temp 98.2 F (36.8 C) (Oral)   Wt 222 lb (  100.7 kg)   SpO2 92%   BMI 31.85 kg/m    Physical Exam Vitals reviewed.  Constitutional:      General: He is awake.     Appearance: Normal appearance. He is well-developed and well-groomed.  HENT:     Head: Normocephalic and atraumatic.     Right Ear: Hearing, tympanic membrane and ear canal normal.     Left Ear: Hearing, tympanic membrane and ear canal normal.     Mouth/Throat:     Lips: Pink.     Mouth: Mucous membranes are moist.     Pharynx: Oropharynx is clear. Uvula midline. No oropharyngeal exudate or posterior oropharyngeal erythema.  Eyes:     Extraocular Movements: Extraocular movements intact.     Conjunctiva/sclera: Conjunctivae normal.  Cardiovascular:     Rate and Rhythm: Normal rate and regular rhythm.     Heart sounds:  Normal heart sounds.  Pulmonary:     Effort: Pulmonary effort is normal. Prolonged expiration present.     Breath sounds: Decreased air movement present. Wheezing and rhonchi present. No rales.  Musculoskeletal:     Cervical back: Normal range of motion and neck supple.  Skin:    General: Skin is warm and dry.  Neurological:     General: No focal deficit present.     Mental Status: He is alert and oriented to person, place, and time.  Psychiatric:        Mood and Affect: Mood normal.        Behavior: Behavior normal. Behavior is cooperative.       No results found for any visits on 11/26/22.  Assessment & Plan      Return in about 2 months (around 01/26/2023) for with PCP for COPD, chronic health management, CPE.      Problem List Items Addressed This Visit       Respiratory   COPD (chronic obstructive pulmonary disease) (HCC)    Chronic, ongoing condition with acute exacerbation likely secondary to recent illness Will restart Advair and provide Albuterol inhaler for PRN use  Will provide Z-pack to assist with pulmonary inflammation and provide abx coverage given longstanding COPD  Also recommend starting OTC Mucinex and Robitussin for symptomatic management Will provide referral to Pulmonology for PFTs and further evaluation for breathing  Recommend commitment to smoking cessation and close coordination with PCP to assist with this  Reviewed ED and return precautions, these were provided in AVS as well.  Recommend follow up with PCP in about 2 months for chronic management       Relevant Medications   fluticasone-salmeterol (ADVAIR DISKUS) 250-50 MCG/ACT AEPB   albuterol (VENTOLIN HFA) 108 (90 Base) MCG/ACT inhaler   azithromycin (ZITHROMAX) 250 MG tablet   Other Relevant Orders   Ambulatory referral to Pulmonology     Return in about 2 months (around 01/26/2023) for with PCP for COPD, chronic health management, CPE.   I, Matisyn Cabeza E Lindie Roberson, PA-C, have reviewed all  documentation for this visit. The documentation on 11/26/22 for the exam, diagnosis, procedures, and orders are all accurate and complete.   Jacquelin Hawking, MHS, PA-C [facility]

## 2022-11-26 NOTE — Telephone Encounter (Signed)
FYI

## 2022-11-26 NOTE — Telephone Encounter (Signed)
  Chief Complaint: Productive cough Symptoms: Cough, productive for greenish phlegm, headache, SOB with minimal exertion and coughing. H/O COPD. Nausea, no vomiting. Frequency: 2 days Pertinent Negatives: Patient denies fever Disposition: [] ED /[] Urgent Care (no appt availability in office) / [x] Appointment(In office/virtual)/ []  [facility]/ [] Home Care/ [] Refused Recommended Disposition /[] [facility]/ []  Follow-up with PCP Additional Notes: No availably at CS within protocol timeframe. Appt secured with Erin at [facility] for this AM. Care advise provided, verbalizes understanding. Reason for Disposition  [1] MILD difficulty breathing (e.g., minimal/no SOB at rest, SOB with walking, pulse <100) AND [2] still present when not coughing  Answer Assessment - Initial Assessment Questions 1. ONSET: "When did the cough begin?"      2 days ago 2. SEVERITY: "How bad is the cough today?"      Awake at HS, bad spells during the day 3. SPUTUM: "Describe the color of your sputum" (none, dry cough; clear, white, yellow, green)     Green at times 4. HEMOPTYSIS: "Are you coughing up any blood?" If so ask: "How much?" (flecks, streaks, tablespoons, etc.)     No 5. DIFFICULTY BREATHING: "Are you having difficulty breathing?" If Yes, ask: "How bad is it?" (e.g., mild, moderate, severe)    - MILD: No SOB at rest, mild SOB with walking, speaks normally in sentences, can lie down, no retractions, pulse < 100.    - MODERATE: SOB at rest, SOB with minimal exertion and prefers to sit, cannot lie down flat, speaks in phrases, mild retractions, audible wheezing, pulse 100-120.    - SEVERE: Very SOB at rest, speaks in single words, struggling to breathe, sitting hunched forward, retractions, pulse > 120      Moderate with exertion, with coughing 6. FEVER: "Do you have a fever?" If Yes, ask: "What is your temperature, how was it measured, and when did it start?"     No 7. CARDIAC HISTORY: "Do you  have any history of heart disease?" (e.g., heart attack, congestive heart failure)       8. LUNG HISTORY: "Do you have any history of lung disease?"  (e.g., pulmonary embolus, asthma, emphysema)     COPD 9. PE RISK FACTORS: "Do you have a history of blood clots?" (or: recent major surgery, recent prolonged travel, bedridden)     No 10. OTHER SYMPTOMS: "Do you have any other symptoms?" (e.g., runny nose, wheezing, chest pain)       Nausea, headache  Protocols used: Cough - Acute Productive-A-AH

## 2022-11-26 NOTE — Assessment & Plan Note (Signed)
Chronic, ongoing condition with acute exacerbation likely secondary to recent illness Will restart Advair and provide Albuterol inhaler for PRN use  Will provide Z-pack to assist with pulmonary inflammation and provide abx coverage given longstanding COPD  Also recommend starting OTC Mucinex and Robitussin for symptomatic management Will provide referral to Pulmonology for PFTs and further evaluation for breathing  Recommend commitment to smoking cessation and close coordination with PCP to assist with this  Reviewed ED and return precautions, these were provided in AVS as well.  Recommend follow up with PCP in about 2 months for chronic management

## 2022-12-09 ENCOUNTER — Emergency Department
Admission: EM | Admit: 2022-12-09 | Discharge: 2022-12-09 | Disposition: A | Discharge status: Home / Self Care | Payer: BLUE CROSS/BLUE SHIELD | Attending: Emergency Medicine | Admitting: Emergency Medicine

## 2022-12-09 ENCOUNTER — Emergency Department: Payer: BLUE CROSS/BLUE SHIELD

## 2022-12-09 ENCOUNTER — Other Ambulatory Visit: Payer: Self-pay

## 2022-12-09 ENCOUNTER — Encounter: Payer: Self-pay | Admitting: Emergency Medicine

## 2022-12-09 DIAGNOSIS — R079 Chest pain, unspecified: Secondary | ICD-10-CM

## 2022-12-09 DIAGNOSIS — K458 Other specified abdominal hernia without obstruction or gangrene: Secondary | ICD-10-CM | POA: Insufficient documentation

## 2022-12-09 DIAGNOSIS — J441 Chronic obstructive pulmonary disease with (acute) exacerbation: Secondary | ICD-10-CM | POA: Insufficient documentation

## 2022-12-09 DIAGNOSIS — R0789 Other chest pain: Secondary | ICD-10-CM | POA: Diagnosis present

## 2022-12-09 DIAGNOSIS — J449 Chronic obstructive pulmonary disease, unspecified: Secondary | ICD-10-CM

## 2022-12-09 DIAGNOSIS — K469 Unspecified abdominal hernia without obstruction or gangrene: Secondary | ICD-10-CM

## 2022-12-09 LAB — COMPREHENSIVE METABOLIC PANEL
ALT: 17 U/L (ref 0–44)
AST: 24 U/L (ref 15–41)
Albumin: 4.2 g/dL (ref 3.5–5.0)
Alkaline Phosphatase: 57 U/L (ref 38–126)
Anion gap: 7 (ref 5–15)
BUN: 14 mg/dL (ref 6–20)
CO2: 29 mmol/L (ref 22–32)
Calcium: 9.2 mg/dL (ref 8.9–10.3)
Chloride: 104 mmol/L (ref 98–111)
Creatinine, Ser: 0.93 mg/dL (ref 0.61–1.24)
GFR, Estimated: >60 mL/min (ref >60)
Glucose, Bld: 100 mg/dL — ABNORMAL HIGH (ref 70–99)
Potassium: 4.1 mmol/L (ref 3.5–5.1)
Sodium: 140 mmol/L (ref 135–145)
Total Bilirubin: 0.5 mg/dL (ref 0.3–1.2)
Total Protein: 7.6 g/dL (ref 6.5–8.1)

## 2022-12-09 LAB — CBC WITH DIFFERENTIAL/PLATELET
Abs Immature Granulocytes: 0.01 10*3/uL (ref 0.00–0.07)
Basophils Absolute: 0.1 10*3/uL (ref 0.0–0.1)
Basophils Relative: 1 %
Eosinophils Absolute: 0.2 10*3/uL (ref 0.0–0.5)
Eosinophils Relative: 3 %
HCT: 52.7 % — ABNORMAL HIGH (ref 39.0–52.0)
Hemoglobin: 17.8 g/dL — ABNORMAL HIGH (ref 13.0–17.0)
Immature Granulocytes: 0 %
Lymphocytes Relative: 36 %
Lymphs Abs: 2.3 10*3/uL (ref 0.7–4.0)
MCH: 32.1 pg (ref 26.0–34.0)
MCHC: 33.8 g/dL (ref 30.0–36.0)
MCV: 95.1 fL (ref 80.0–100.0)
Monocytes Absolute: 0.5 10*3/uL (ref 0.1–1.0)
Monocytes Relative: 8 %
Neutro Abs: 3.4 10*3/uL (ref 1.7–7.7)
Neutrophils Relative %: 52 %
Platelets: 209 10*3/uL (ref 150–400)
RBC: 5.54 MIL/uL (ref 4.22–5.81)
RDW: 12.6 % (ref 11.5–15.5)
WBC: 6.4 10*3/uL (ref 4.0–10.5)
nRBC: 0 % (ref 0.0–0.2)

## 2022-12-09 LAB — LIPASE, BLOOD: Lipase: 27 U/L (ref 11–51)

## 2022-12-09 LAB — TROPONIN I (HIGH SENSITIVITY): Troponin I (High Sensitivity): 7 ng/L (ref <18)

## 2022-12-09 MED ORDER — IPRATROPIUM-ALBUTEROL 0.5-2.5 (3) MG/3ML IN SOLN
3.0000 mL | Freq: Once | RESPIRATORY_TRACT | Status: AC
  Administered 2022-12-09: 3 mL via RESPIRATORY_TRACT
  Filled 2022-12-09: qty 6

## 2022-12-09 MED ORDER — IPRATROPIUM-ALBUTEROL 0.5-2.5 (3) MG/3ML IN SOLN
3.0000 mL | Freq: Once | RESPIRATORY_TRACT | Status: AC
  Administered 2022-12-09: 3 mL via RESPIRATORY_TRACT

## 2022-12-09 MED ORDER — METHYLPREDNISOLONE SODIUM SUCC 125 MG IJ SOLR
125.0000 mg | Freq: Once | INTRAMUSCULAR | Status: AC
  Administered 2022-12-09: 125 mg via INTRAVENOUS
  Filled 2022-12-09: qty 2

## 2022-12-09 MED ORDER — PREDNISONE 10 MG PO TABS: 10.0000 mg | ORAL_TABLET | Freq: Every day | ORAL | 0 refills | Status: DC

## 2022-12-09 NOTE — Discharge Instructions (Signed)
Please take your steroids as prescribed.  Please follow-up with general surgery by calling the number provided to arrange a follow-up appointment soon as possible.  Return to the emergency department for any trouble breathing worsening chest pain worsening abdominal pain or any other symptom personally concerning to yourself.

## 2022-12-09 NOTE — ED Provider Notes (Signed)
[facility] Provider Note    Event Date/Time   First MD Initiated Contact with Patient 12/09/22 [phone]     (approximate)  History   Chief Complaint: Chest Pain  HPI  Peter [name] is a 57 y.o. male with a past medical history of arthritis, COPD, gastric reflux, presents to the emergency department with multiple complaints.  According to the patient he has been experiencing pain in the left chest intermittent for the last week or so.  States he followed up with his doctor for chest pain cough was prescribed Zithromax for presumed bronchitis as well as DuoNebs.  Patient has finished the Zithromax but continues to have intermittent left chest pain.  As a second complaint patient also states for the last few months he has had worsening pain and swelling in his right abdomen.  Patient states a prior intestinal surgery to the right abdomen but states over the last few months he feels like this area has become swollen and intermittently painful.  Denies any significant constipation.  No vomiting.  No urinary symptoms no fever.  Physical Exam   Triage Vital Signs: ED Triage Vitals  Enc Vitals Group     BP 12/09/22 0652 (!) 149/105     Pulse Rate 12/09/22 0652 81     Resp 12/09/22 0652 20     Temp 12/09/22 0652 98 F (36.7 C)     Temp Source 12/09/22 0652 Oral     SpO2 12/09/22 0652 92 %     Weight 12/09/22 0640 220 lb (99.8 kg)     Height 12/09/22 0640 5\' 10"  (1.778 m)     Head Circumference --      Peak Flow --      Pain Score 12/09/22 0640 8     Pain Loc --      Pain Edu? --      Excl. in GC? --     Most recent vital signs: Vitals:   12/09/22 0652  BP: (!) 149/105  Pulse: 81  Resp: 20  Temp: 98 F (36.7 C)  SpO2: 92%    General: Awake, no distress.  CV:  Good peripheral perfusion.  Regular rate and rhythm  Resp:  Normal effort.  Equal breath sounds bilaterally.  Mild expiratory wheeze bilaterally Abd:  No distention.  Soft, nontender.  No rebound  or guarding.  Mild swelling of the right abdomen where the patient has a larger well-appearing surgical scar.  Largely nontender on exam.  ED Results / Procedures / Treatments   EKG  EKG viewed and interpreted by myself shows a normal sinus rhythm 86 bpm with a narrow QRS, normal axis, normal intervals, no concerning ST changes.  RADIOLOGY  I have reviewed and interpreted the chest x-ray images.  No consolidation seen on my evaluation. Radiology is read the x-ray as negative.  CT scan shows a large hernia in the right lower quadrant but no obstruction.   MEDICATIONS ORDERED IN ED: Medications  methylPREDNISolone sodium succinate (SOLU-MEDROL) 125 mg/2 mL injection 125 mg (125 mg Intravenous Given 12/09/22 0725)     IMPRESSION / MDM / ASSESSMENT AND PLAN / ED COURSE  I reviewed the triage vital signs and the nursing notes.  Patient's presentation is most consistent with acute presentation with potential threat to life or bodily function.  Patient presents emergency department for chest pain and right abdominal pain.  Chest pain has been going on for at least 1 week is intermittent and worse with  cough per patient.  Does state increased cough was recently prescribed Zithromax by his doctor for acute bronchitis as well as DuoNebs he has been using twice daily.  Patient does have mild expiratory wheezes a daily smoker, we will add Solu-Medrol dose DuoNebs and continue to closely monitor.  Reassuringly patient satting 94 to 96% during my evaluation on room air.  Patient's right abdominal pain/swelling has been ongoing for months per patient largely nontender but is somewhat swollen to this area.  We will obtain CT imaging in addition to lab work to evaluate.  Patient's lab work is overall reassuring with a normal CBC, normal chemistry including LFTs normal lipase negative troponin.  CT shows a large hernia in the right lower quadrant but no obstruction.  Will refer to general surgery for  further evaluation.  Patient's lab work is reassuring with a normal CBC normal chemistry normal lipase negative troponin.  We will discharge on a prednisone taper for likely COPD exacerbation.  Patient to continue to use his nebulizer treatments at home as prescribed by his doctor.  Patient agreeable to plan of care.  FINAL CLINICAL IMPRESSION(S) / ED DIAGNOSES   COPD exacerbation Abdominal pain  Rx / DC Orders   COPD exacerbation Abdominal hernia  Note:  This document was prepared using Dragon voice recognition software and may include unintentional dictation errors.   Minna Antis, MD 12/09/22 [phone]

## 2022-12-09 NOTE — ED Notes (Signed)
See triage note. Pt reports left sided chest pain worsening today. Also reports continued RLQ pain, states has had diverticulitis and now stomach is more swollen. Cough noted. Reports hx COPD.

## 2022-12-09 NOTE — ED Triage Notes (Signed)
Patient ambulatory to triage with steady gait, without difficulty or distress noted; pt with multiple c/o; st for last month having intermittent rt lower abd pain radiating into back, weakness, dizziness, leg pain, left sided CP

## 2022-12-13 NOTE — Progress Notes (Unsigned)
Name: Peter [name]   MRN: [id number]    DOB: 25-Sep-1965   Date:12/13/2022       Progress Note  Subjective  Chief Complaint  No chief complaint on file.   HPI  Patient presents for annual CPE ***.  IPSS Questionnaire (AUA-7): Over the past month.   1)  How often have you had a sensation of not emptying your bladder completely after you finish urinating?  {Rating:19227}  2)  How often have you had to urinate again less than two hours after you finished urinating? {Rating:19227}  3)  How often have you found you stopped and started again several times when you urinated?  {Rating:19227}  4) How difficult have you found it to postpone urination?  {Rating:19227}  5) How often have you had a weak urinary stream?  {Rating:19227}  6) How often have you had to push or strain to begin urination?  {Rating:19227}  7) How many times did you most typically get up to urinate from the time you went to bed until the time you got up in the morning?  {Rating:19228}  Total score:  0-7 mildly symptomatic   8-19 moderately symptomatic   20-35 severely symptomatic     Diet: *** Exercise: *** Last Dental Exam: **** Last Eye Exam: ***  Depression: phq 9 is {gen pos neg:315643}    11/26/2022   11:10 AM 06/12/2021   10:07 AM 05/22/2021    8:24 AM 02/13/2021    8:19 AM 01/17/2021    3:04 PM  Depression screen PHQ 2/9  Decreased Interest 3 0 0 0 3  Down, Depressed, Hopeless 2 0 0 0 3  PHQ - 2 Score 5 0 0 0 6  Altered sleeping 1    3  Tired, decreased energy 3    3  Change in appetite 2    2  Feeling bad or failure about yourself  1    1  Trouble concentrating 1    1  Moving slowly or fidgety/restless 0    1  Suicidal thoughts 0    2  PHQ-9 Score 13    19  Difficult doing work/chores Very difficult    Very difficult    Hypertension:  BP Readings from Last 3 Encounters:  12/09/22 131/82  11/26/22 131/79  02/18/22 (!) 146/105    Obesity: Wt Readings from Last 3 Encounters:  12/09/22 220 lb  (99.8 kg)  11/26/22 222 lb (100.7 kg)  02/18/22 225 lb (102.1 kg)   BMI Readings from Last 3 Encounters:  12/09/22 31.57 kg/m  11/26/22 31.85 kg/m  02/18/22 32.28 kg/m     Lipids:  Lab Results  Component Value Date   CHOL 167 01/17/2021   CHOL 174 02/02/2020   CHOL 156 09/08/2018   Lab Results  Component Value Date   HDL 37 (L) 01/17/2021   HDL 46 02/02/2020   HDL 40 09/08/2018   Lab Results  Component Value Date   LDLCALC 110 (H) 01/17/2021   LDLCALC 114 (H) 02/02/2020   LDLCALC 96 09/08/2018   Lab Results  Component Value Date   TRIG 95 01/17/2021   TRIG 53 02/02/2020   TRIG 103 09/08/2018   Lab Results  Component Value Date   CHOLHDL 4.5 01/17/2021   CHOLHDL 3.8 02/02/2020   CHOLHDL 3.9 09/08/2018   No results found for: "LDLDIRECT" Glucose:  Glucose  Date Value Ref Range Status  01/14/2021 111 (H) 65 - 99 mg/dL Final  40/98/1191 90 65 - 99  mg/dL Final  16/04/9603 95 65 - 99 mg/dL Final   Glucose, Bld  Date Value Ref Range Status  12/09/2022 100 (H) 70 - 99 mg/dL Final    Comment:    Glucose reference range applies only to samples taken after fasting for at least 8 hours.  08/12/2021 162 (H) 70 - 99 mg/dL Final    Comment:    Glucose reference range applies only to samples taken after fasting for at least 8 hours.  11/15/2020 89 70 - 99 mg/dL Final    Comment:    Glucose reference range applies only to samples taken after fasting for at least 8 hours.    Flowsheet Row Office Visit from 01/17/2021 in [facility]  AUDIT-C Score 0      ***  Single STD testing and prevention (HIV/chl/gon/syphilis):  {yes/no/default n/a:21102::"not applicable"} Hep C Screening: 2022 Skin cancer: Discussed monitoring for atypical lesions Colorectal cancer: colonoscopy 02/2021, due for repeat Prostate cancer:  yes Lab Results  Component Value Date   PSA 0.37 01/17/2021   PSA 0.5 02/02/2020     Lung cancer:  Low Dose CT Chest  recommended if Age 72-80 years, 30 pack-year currently smoking OR have quit w/in 15years. Patient  {Response; is/is not/na:63} a candidate for screening   AAA: The USPSTF recommends one-time screening with ultrasonography in men ages 13 to 75 years who have ever smoked. Patient   no, a candidate for screening  ECG:  12/09/22  Vaccines:   Tdap: 2023 Shingrix: Discussed obtaining at pharmacy Pneumonia: prevnar 23 in 2017 [id number]  COVID-19: uncertain  Advanced Care Planning: A voluntary discussion about advance care planning including the explanation and discussion of advance directives.  Discussed health care proxy and Living will, and the patient was able to identify a health care proxy as ***.  Patient {DOES_DOES GNF:62130} have a living will and power of attorney of health care   Patient Active Problem List   Diagnosis Date Noted   Lumbar foraminal stenosis (Bilateral: L2, L3, L4) (R>L: L5) 11/05/2021   Lumbar lateral recess stenosis 11/05/2021   Chronic lower extremity pain (Right) 11/05/2021   Lumbar radiculopathy (Right) 11/05/2021   Chronic anticoagulation (Plaquenil) 11/05/2021   Chronic low back pain (Right) w/o sciatica 07/24/2021   History of colonic polyps    Elevated sed rate 02/03/2021   Vitamin D insufficiency 02/03/2021   DDD (degenerative disc disease), cervical 02/03/2021   Osteoarthritis of AC (acromioclavicular) joint (Right) 02/03/2021   Osteoarthritis of AC (acromioclavicular) joint (Left) 02/03/2021   Cervical foraminal stenosis (Multilevel) (Bilateral) 02/03/2021   Lumbar facet hypertrophy 02/03/2021   Lumbosacral facet joint syndrome 02/03/2021   Spondylosis without myelopathy or radiculopathy, lumbosacral region 02/03/2021   Psoriatic arthritis (HCC) 01/23/2021   Hyperlipidemia 01/17/2021   Chronic pain syndrome 11/04/2020   Pharmacologic therapy 11/04/2020   Disorder of skeletal system 11/04/2020   Problems influencing health status 11/04/2020    Abnormal MRI, thoracic spine (07/26/2020) 11/04/2020   Abnormal MRI, lumbar spine (07/26/2020) 11/04/2020   Abnormal MRI, cervical spine (01/11/2019) 11/04/2020   Retrolisthesis (T12/L1) & (L1/L2) 11/04/2020   Anterolisthesis of lumbosacral spine (L5/S1) 11/04/2020   Cervical fusion syndrome (ACDF C3-7) 11/04/2020   Chronic low back pain (1ry area of Pain) (Bilateral) (R>L) w/o sciatica 11/04/2020   Chronic mid back pain (Bilateral) (R>L) 11/04/2020   Cervicalgia 11/04/2020   Chronic neck pain (3ry area of Pain) (Bilateral) (R>L) w/ history of cervical spinal surgery 11/04/2020   Cervicogenic headaches (4th area  of Pain) (Bilateral) (R>L) 11/04/2020   Occipital headaches (Bilateral) (R>L) 11/04/2020   Chronic upper extremity pain (5th area of Pain) (Bilateral) (R>L) 11/04/2020   Chronic wrist pain (7th area of Pain) (Bilateral) (R>L) 11/04/2020   Primary osteoarthritis involving multiple joints 06/24/2020   Rheumatoid arthritis involving multiple sites with positive rheumatoid factor (HCC) 06/24/2020   Postherpetic neuralgia 04/10/2020   Herpes zoster without complication 04/04/2020   Reactive depression 04/04/2020   Chronic fatigue 02/02/2020   S/P cervical spinal fusion 02/15/2019   Preventative health care 09/08/2018   Ventral hernia without obstruction or gangrene 09/08/2018   History of colonoscopy with polypectomy 09/08/2018   Hx of lower gastrointestinal bleeding 09/08/2018   Obesity (BMI 30.0-34.9) 04/26/2018   COPD (chronic obstructive pulmonary disease) (HCC) 04/26/2018   Arthritis of right hand 04/26/2018   Arthritis of left hand 04/26/2018   DDD (degenerative disc disease), lumbosacral 04/26/2018   Chronic knee pain (6th area of Pain) (Bilateral) (R>L) 04/26/2018   Dry skin 04/26/2018   Tobacco dependence 08/20/2015   Rectal polyp    Cervical radiculopathy 06/22/2015   Displacement of intervertebral disc at C6-C7 level 06/22/2015   Chronic shoulder pain (2ry area of  Pain) (Bilateral) (R>L) 06/22/2015    Past Surgical History:  Procedure Laterality Date   ANTERIOR CERVICAL DECOMPRESSION/DISCECTOMY FUSION 4 LEVELS N/A 02/15/2019   Procedure: ANTERIOR CERVICAL DECOMPRESSION/DISCECTOMY FUSION 4 LEVELS C3-7;  Surgeon: Venetia Night, MD;  Location: [facility];  Service: Neurosurgery;  Laterality: N/A;   APPENDECTOMY  1988   BLADDER REPAIR N/A 09/11/2015   Procedure: BLADDER REPAIR;  Surgeon: Hildred Laser, MD;  Location: [facility];  Service: Urology;  Laterality: N/A;   CHOLECYSTECTOMY  1989   COLON SURGERY     for diverticulitis   COLONOSCOPY N/A 02/14/2021   Procedure: COLONOSCOPY;  Surgeon: Pasty Spillers, MD;  Location: [facility];  Service: Endoscopy;  Laterality: N/A;   COLONOSCOPY WITH PROPOFOL N/A 08/16/2015   Procedure: COLONOSCOPY WITH PROPOFOL;  Surgeon: Midge Minium, MD;  Location: [facility];  Service: Endoscopy;  Laterality: N/A;   CYSTOSCOPY WITH STENT PLACEMENT Bilateral 09/11/2015   Procedure: CYSTOSCOPY WITH STENT PLACEMENT;  Surgeon: Hildred Laser, MD;  Location: [facility];  Service: Urology;  Laterality: Bilateral;   ILEOSTOMY CLOSURE N/A 11/19/2015   Procedure: ILEOSTOMY TAKEDOWN;  Surgeon: Gladis Riffle, MD;  Location: [facility];  Service: General;  Laterality: N/A;   LAPAROSCOPIC PARTIAL COLECTOMY N/A 09/11/2015   Procedure: LAPAROSCOPIC PARTIAL COLECTOMY Possible Open, possible ostomy, repair of colovesical fistula Cystoscopy and stent placement, repair of colovesical fistula with Dr. Sherryl Barters as well ;  Surgeon: Gladis Riffle, MD;  Location: [facility];  Service: General;  Laterality: N/A;   POLYPECTOMY  08/16/2015   Procedure: POLYPECTOMY;  Surgeon: Midge Minium, MD;  Location: [facility];  Service: Endoscopy;;   TONSILLECTOMY  1970    Family History  Problem Relation Age of Onset   Cholecystitis Mother    Lung cancer Father    Emphysema Paternal Grandmother    Diabetes Paternal Grandmother     Prostate cancer Maternal Uncle    Diabetes Brother    Hypertension Brother     Social History   Socioeconomic History   Marital status: Single    Spouse name: Not on file   Number of children: Not on file   Years of education: Not on file   Highest education level: GED or equivalent  Occupational History   Not on file  Tobacco  Use   Smoking status: Every Day    Packs/day: 1.00    Years: 42.00    Additional pack years: 0.00    Total pack years: 42.00    Types: Cigarettes   Smokeless tobacco: Never  Vaping Use   Vaping Use: Never used  Substance and Sexual Activity   Alcohol use: Yes    Comment: Occasionally   Drug use: Not Currently    Types: Marijuana   Sexual activity: Not Currently  Other Topics Concern   Not on file  Social History Narrative   Not on file   Social Determinants of Health   Financial Resource Strain: Medium Risk (01/17/2021)   Overall Financial Resource Strain (CARDIA)    Difficulty of Paying Living Expenses: Somewhat hard  Food Insecurity: No Food Insecurity (01/17/2021)   Hunger Vital Sign    Worried About Running Out of Food in the Last Year: Never true    Ran Out of Food in the Last Year: Never true  Transportation Needs: No Transportation Needs (01/17/2021)   PRAPARE - Administrator, Civil Service (Medical): No    Lack of Transportation (Non-Medical): No  Physical Activity: Sufficiently Active (01/17/2021)   Exercise Vital Sign    Days of Exercise per Week: 7 days    Minutes of Exercise per Session: 60 min  Stress: Stress Concern Present (01/17/2021)   [employer] of Occupational Health - Occupational Stress Questionnaire    Feeling of Stress : Very much  Social Connections: Socially Isolated (01/17/2021)   Social Connection and Isolation Panel [NHANES]    Frequency of Communication with Friends and Family: More than three times a week    Frequency of Social Gatherings with Friends and Family: Patient declined    Attends  Religious Services: Never    Database administrator or Organizations: No    Attends Banker Meetings: Never    Marital Status: Never married  Intimate Partner Violence: Unknown (01/17/2021)   Humiliation, Afraid, Rape, and Kick questionnaire    Fear of Current or Ex-Partner: No    Emotionally Abused: No    Physically Abused: Patient declined    Sexually Abused: No     Current Outpatient Medications:    albuterol (VENTOLIN HFA) 108 (90 Base) MCG/ACT inhaler, Inhale 2 puffs into the lungs every 6 (six) hours as needed for wheezing or shortness of breath., Disp: 8 g, Rfl: 2   azithromycin (ZITHROMAX) 250 MG tablet, Take 500mg  PO daily x1d and then 250mg  daily x4 days, Disp: 6 each, Rfl: 0   fluticasone-salmeterol (ADVAIR DISKUS) 250-50 MCG/ACT AEPB, Inhale 1 puff into the lungs in the morning and at bedtime., Disp: 60 each, Rfl: 2   predniSONE (DELTASONE) 10 MG tablet, Take 1 tablet (10 mg total) by mouth daily. Day 1-3: take 4 tablets PO daily Day 4-6: take 3 tablets PO daily Day 7-9: take 2 tablets PO daily Day 10-12: take 1 tablet PO daily, Disp: 30 tablet, Rfl: 0  No Known Allergies   ROS  ***   Objective  There were no vitals filed for this visit.  There is no height or weight on file to calculate BMI.  Physical Exam ***  Recent Results (from the past 2160 hour(s))  CBC with Differential     Status: Abnormal   Collection Time: 12/09/22  6:53 AM  Result Value Ref Range   WBC 6.4 4.0 - 10.5 K/uL   RBC 5.54 4.22 - 5.81 MIL/uL   Hemoglobin  17.8 (H) 13.0 - 17.0 g/dL   HCT 40.9 (H) 81.1 - 91.4 %   MCV 95.1 80.0 - 100.0 fL   MCH 32.1 26.0 - 34.0 pg   MCHC 33.8 30.0 - 36.0 g/dL   RDW 78.2 95.6 - 21.3 %   Platelets 209 150 - 400 K/uL   nRBC 0.0 0.0 - 0.2 %   Neutrophils Relative % 52 %   Neutro Abs 3.4 1.7 - 7.7 K/uL   Lymphocytes Relative 36 %   Lymphs Abs 2.3 0.7 - 4.0 K/uL   Monocytes Relative 8 %   Monocytes Absolute 0.5 0.1 - 1.0 K/uL   Eosinophils  Relative 3 %   Eosinophils Absolute 0.2 0.0 - 0.5 K/uL   Basophils Relative 1 %   Basophils Absolute 0.1 0.0 - 0.1 K/uL   Immature Granulocytes 0 %   Abs Immature Granulocytes 0.01 0.00 - 0.07 K/uL    Comment: Performed at [facility], [address] [place], [place] [address]  Comprehensive metabolic panel     Status: Abnormal   Collection Time: 12/09/22  6:53 AM  Result Value Ref Range   Sodium 140 135 - 145 mmol/L   Potassium 4.1 3.5 - 5.1 mmol/L   Chloride 104 98 - 111 mmol/L   CO2 29 22 - 32 mmol/L   Glucose, Bld 100 (H) 70 - 99 mg/dL    Comment: Glucose reference range applies only to samples taken after fasting for at least 8 hours.   BUN 14 6 - 20 mg/dL   Creatinine, Ser 8.46 0.61 - 1.24 mg/dL   Calcium 9.2 8.9 - 96.2 mg/dL   Total Protein 7.6 6.5 - 8.1 g/dL   Albumin 4.2 3.5 - 5.0 g/dL   AST 24 15 - 41 U/L   ALT 17 0 - 44 U/L   Alkaline Phosphatase 57 38 - 126 U/L   Total Bilirubin 0.5 0.3 - 1.2 mg/dL   GFR, Estimated >95 >28 mL/min    Comment: (NOTE) Calculated using the CKD-EPI Creatinine Equation (2021)    Anion gap 7 5 - 15    Comment: Performed at [facility], [address] [place], [place] [address]  Lipase, blood     Status: None   Collection Time: 12/09/22  6:53 AM  Result Value Ref Range   Lipase 27 11 - 51 U/L    Comment: Performed at [facility], [address], [place], [place] [address]  Troponin I (High Sensitivity)     Status: None   Collection Time: 12/09/22  6:53 AM  Result Value Ref Range   Troponin I (High Sensitivity) 7 <18 ng/L    Comment: (NOTE) Elevated high sensitivity troponin I (hsTnI) values and significant  changes across serial measurements may suggest ACS but many other  chronic and acute conditions are known to elevate hsTnI results.  Refer to the "Links" section for chest pain algorithms and additional  guidance. Performed at [facility], [address] [place], [place] [address]       Fall Risk:    11/26/2022   11:09 AM 06/12/2021   10:07 AM 05/22/2021    8:24 AM 04/01/2021   10:04 AM 02/13/2021    8:19 AM  Fall Risk   Falls in the past year? 1 0 0 0 0  Number falls in past yr: 1      Injury with Fall? 0      Risk for fall due to : History of fall(s)  Follow up Falls evaluation completed         Functional Status Survey:      Assessment & Plan  There are no diagnoses linked to this encounter.   -Prostate cancer screening and PSA options (with potential risks and benefits of testing vs not testing) were discussed along with recent recs/guidelines. -USPSTF grade A and B recommendations reviewed with patient; age-appropriate recommendations, preventive care, screening tests, etc discussed and encouraged; healthy living encouraged; see AVS for patient education given to patient -Discussed importance of 150 minutes of physical activity weekly, eat two servings of fish weekly, eat one serving of tree nuts ( cashews, pistachios, pecans, almonds.Marland Kitchen) every other day, eat 6 servings of fruit/vegetables daily and drink plenty of water and avoid sweet beverages.  -Reviewed Health Maintenance: {yes/no/default n/a:21102::"not applicable"}

## 2022-12-14 NOTE — Progress Notes (Unsigned)
Patient ID: Peter [name], male   DOB: June 12, 1966, 57 y.o.   MRN: [id number]  Chief Complaint: Right lower quadrant abdominal wall hernia for 5 years  History of Present Illness Peter [name] is a 57 y.o. male with history of right lower quadrant ileostomy back in 2015 with its takedown 2016. Shortly after surgery he knew he developed a knot there, had reassurance that it was just fatty tissue.  Over the years since it would bother him sporadically depending upon the amount of heavy lifting and work he was doing.  There was an occasion when he developed some chest pains and presented to the ER, and was encouraged to bring it up then that he had this hernia issue.  A renal CT scan was obtained which revealed the right lower quadrant hernia defect with the extensive volume of bowel within it.  No evidence of compromise.  He reports at its worst the pain ranges from a 5 to an 8.  He has some degree of associated nausea with exacerbations of pain, but denies any vomiting, fevers or chills.  He reports his bowel activity is not consistent ranging from constipation to diarrhea. He is a current smoker, currently utilizing an inhaler, has referrals for both pulmonology evaluation and a cardiology consultation. He is currently in no pain, has only been with his current place of work for about a year and does not feel that he can take time off to pursue hernia repair without losing his position.  Past Medical History Past Medical History:  Diagnosis Date   Arthritis    neck, shoulders   COPD (chronic obstructive pulmonary disease) (HCC)    Diverticulitis    Generalized headaches    GERD (gastroesophageal reflux disease)    occ   History of colonoscopy with polypectomy 09/08/2018   Aug 16, 2015   Lower back pain    Rectal pain    Thrombosed hemorrhoids       Past Surgical History:  Procedure Laterality Date   ANTERIOR CERVICAL DECOMPRESSION/DISCECTOMY FUSION 4 LEVELS N/A 02/15/2019   Procedure:  ANTERIOR CERVICAL DECOMPRESSION/DISCECTOMY FUSION 4 LEVELS C3-7;  Surgeon: Venetia Night, MD;  Location: [facility];  Service: Neurosurgery;  Laterality: N/A;   APPENDECTOMY  1988   BLADDER REPAIR N/A 09/11/2015   Procedure: BLADDER REPAIR;  Surgeon: Hildred Laser, MD;  Location: [facility];  Service: Urology;  Laterality: N/A;   CHOLECYSTECTOMY  1989   COLON SURGERY     for diverticulitis   COLONOSCOPY N/A 02/14/2021   Procedure: COLONOSCOPY;  Surgeon: Pasty Spillers, MD;  Location: [facility];  Service: Endoscopy;  Laterality: N/A;   COLONOSCOPY WITH PROPOFOL N/A 08/16/2015   Procedure: COLONOSCOPY WITH PROPOFOL;  Surgeon: Midge Minium, MD;  Location: [facility];  Service: Endoscopy;  Laterality: N/A;   CYSTOSCOPY WITH STENT PLACEMENT Bilateral 09/11/2015   Procedure: CYSTOSCOPY WITH STENT PLACEMENT;  Surgeon: Hildred Laser, MD;  Location: [facility];  Service: Urology;  Laterality: Bilateral;   ILEOSTOMY CLOSURE N/A 11/19/2015   Procedure: ILEOSTOMY TAKEDOWN;  Surgeon: Gladis Riffle, MD;  Location: [facility];  Service: General;  Laterality: N/A;   LAPAROSCOPIC PARTIAL COLECTOMY N/A 09/11/2015   Procedure: LAPAROSCOPIC PARTIAL COLECTOMY Possible Open, possible ostomy, repair of colovesical fistula Cystoscopy and stent placement, repair of colovesical fistula with Dr. Sherryl Barters as well ;  Surgeon: Gladis Riffle, MD;  Location: [facility];  Service: General;  Laterality: N/A;   POLYPECTOMY  08/16/2015   Procedure: POLYPECTOMY;  Surgeon: Midge Minium, MD;  Location: [facility];  Service: Endoscopy;;   TONSILLECTOMY  1970    No Known Allergies  Current Outpatient Medications  Medication Sig Dispense Refill   albuterol (VENTOLIN HFA) 108 (90 Base) MCG/ACT inhaler Inhale 2 puffs into the lungs every 6 (six) hours as needed for wheezing or shortness of breath. 8 g 2   fluticasone-salmeterol (ADVAIR DISKUS) 250-50 MCG/ACT AEPB Inhale 1 puff into the lungs in the  morning and at bedtime. 60 each 2   predniSONE (DELTASONE) 10 MG tablet Take 1 tablet (10 mg total) by mouth daily. Day 1-3: take 4 tablets PO daily Day 4-6: take 3 tablets PO daily Day 7-9: take 2 tablets PO daily Day 10-12: take 1 tablet PO daily 30 tablet 0   No current facility-administered medications for this visit.    Family History Family History  Problem Relation Age of Onset   Cholecystitis Mother    Lung cancer Father    Emphysema Paternal Grandmother    Diabetes Paternal Grandmother    Prostate cancer Maternal Uncle    Diabetes Brother    Hypertension Brother       Social History Social History   Tobacco Use   Smoking status: Every Day    Packs/day: 1.00    Years: 42.00    Additional pack years: 0.00    Total pack years: 42.00    Types: Cigarettes   Smokeless tobacco: Never  Vaping Use   Vaping Use: Never used  Substance Use Topics   Alcohol use: Not Currently    Comment: Occasionally   Drug use: Not Currently    Types: Marijuana        Review of Systems  Constitutional: Negative.   HENT: Negative.    Eyes: Negative.   Respiratory:  Positive for cough, shortness of breath and wheezing.   Cardiovascular:  Positive for chest pain.  Gastrointestinal:  Positive for abdominal pain, constipation, heartburn and nausea.  Genitourinary: Negative.   Musculoskeletal:  Positive for back pain.  Skin: Negative.   Neurological:  Positive for dizziness and headaches.  Psychiatric/Behavioral:  Positive for depression.      Physical Exam Blood pressure 118/84, pulse 99, temperature 98.3 F (36.8 C), temperature source Oral, height 5\' 10"  (1.778 m), weight 220 lb (99.8 kg), SpO2 95 %. Last Weight  Most recent update: 12/15/2022  2:26 PM    Weight  99.8 kg (220 lb)             CONSTITUTIONAL: Well developed, and nourished, appropriately responsive and aware without distress.   EYES: Sclera non-icteric.   EARS, NOSE, MOUTH AND THROAT:  The oropharynx is  clear. Oral mucosa is pink and moist.    Hearing is intact to voice.  NECK: Trachea is midline, and there is no jugular venous distension.  LYMPH NODES:  Lymph nodes in the neck are not appreciated. RESPIRATORY:  Breath sounds are equal bilaterally.  Normal respiratory effort without pathologic use of accessory muscles. CARDIOVASCULAR:  Well perfused.  GI: The abdomen is notable for a large right lower quadrant abdominal wall bulge with mass of contents, not readily reducible, but also nontender.  Soft, nontender, and nondistended. There were no other palpable masses.  I did not appreciate hepatosplenomegaly. MUSCULOSKELETAL:  Symmetrical muscle tone appreciated in all four extremities.    SKIN: Skin turgor is normal. No pathologic skin lesions appreciated.  NEUROLOGIC:  Motor and sensation appear grossly normal.  Cranial nerves are grossly without defect. PSYCH:  Alert and oriented to person, place and time. Affect is appropriate for situation.  Data Reviewed I have personally reviewed what is currently available of the patient's imaging, recent labs and medical records.   Labs:     Latest Ref Rng & Units 12/09/2022    6:53 AM 08/12/2021    6:47 PM 01/14/2021    9:21 AM  CBC  WBC 4.0 - 10.5 K/uL 6.4  10.4  6.5   Hemoglobin 13.0 - 17.0 g/dL 54.0  98.1  19.1   Hematocrit 39.0 - 52.0 % 52.7  46.2  47.9   Platelets 150 - 400 K/uL 209  220  193       Latest Ref Rng & Units 12/09/2022    6:53 AM 08/12/2021    6:47 PM 01/14/2021    9:21 AM  CMP  Glucose 70 - 99 mg/dL 478  295  621   BUN 6 - 20 mg/dL 14  18  17    Creatinine 0.61 - 1.24 mg/dL 3.08  6.57  8.46   Sodium 135 - 145 mmol/L 140  141  141   Potassium 3.5 - 5.1 mmol/L 4.1  4.0  4.7   Chloride 98 - 111 mmol/L 104  105  102   CO2 22 - 32 mmol/L 29  25  23    Calcium 8.9 - 10.3 mg/dL 9.2  9.0  9.5   Total Protein 6.5 - 8.1 g/dL 7.6  7.3  6.9   Total Bilirubin 0.3 - 1.2 mg/dL 0.5  0.2  <9.6   Alkaline Phos 38 - 126 U/L 57  38  53   AST 15  - 41 U/L 24  26  23    ALT 0 - 44 U/L 17  15  12      Imaging: Radiological images reviewed:  CLINICAL DATA:  Abdominal/flank pain with stone suspected. Right lower abdominal pain.   EXAM: CT ABDOMEN AND PELVIS WITHOUT CONTRAST   TECHNIQUE: Multidetector CT imaging of the abdomen and pelvis was performed following the standard protocol without IV contrast.   RADIATION DOSE REDUCTION: This exam was performed according to the departmental dose-optimization program which includes automated exposure control, adjustment of the mA and/or kV according to patient size and/or use of iterative reconstruction technique.   COMPARISON:  09/20/2015   FINDINGS: Lower chest:  No contributory findings.   Hepatobiliary: No focal liver abnormality.Absent gallbladder. No biliary dilatation   Pancreas: Generalized atrophy.   Spleen: Granulomatous calcifications.   Adrenals/Urinary Tract: Negative adrenals. No hydronephrosis or stone. Unremarkable bladder.   Stomach/Bowel: Large hernia in the right lower abdominal wall, site of ostomy on comparison CT. Multiple internal loops of small and large bowel without obstruction. Small fatty midline hernia. There is a rectosigmoid anastomosis which is unremarkable.   Vascular/Lymphatic: No acute vascular abnormality. Extensive, especially for age, calcified atheromatous plaque affecting the aorta and branch vessels. No mass or adenopathy.   Reproductive:No pathologic findings.   Other: No ascites or pneumoperitoneum.   Musculoskeletal: No acute abnormalities. Generalized spinal degeneration that is advanced at the lower thoracic levels.   IMPRESSION: 1. No acute finding. 2. Large hernia in the right lower abdominal wall with multiple loops of nonobstructed bowel. 3. Advanced atherosclerosis.     Electronically Signed   By: Tiburcio Pea M.D.   On: 12/09/2022 07:59   Within last 24 hrs: No results found.  Assessment    Incisional  hernia right lower quadrant, with possible loss of domain. Current smoker, with COPD. Recent  chest pain with evaluation by cardiology pending. Patient Active Problem List   Diagnosis Date Noted   Lumbar foraminal stenosis (Bilateral: L2, L3, L4) (R>L: L5) 11/05/2021   Lumbar lateral recess stenosis 11/05/2021   Chronic lower extremity pain (Right) 11/05/2021   Lumbar radiculopathy (Right) 11/05/2021   Chronic anticoagulation (Plaquenil) 11/05/2021   Chronic low back pain (Right) w/o sciatica 07/24/2021   History of colonic polyps    Elevated sed rate 02/03/2021   Vitamin D insufficiency 02/03/2021   DDD (degenerative disc disease), cervical 02/03/2021   Osteoarthritis of AC (acromioclavicular) joint (Right) 02/03/2021   Osteoarthritis of AC (acromioclavicular) joint (Left) 02/03/2021   Cervical foraminal stenosis (Multilevel) (Bilateral) 02/03/2021   Lumbar facet hypertrophy 02/03/2021   Lumbosacral facet joint syndrome 02/03/2021   Spondylosis without myelopathy or radiculopathy, lumbosacral region 02/03/2021   Psoriatic arthritis (HCC) 01/23/2021   Hyperlipidemia 01/17/2021   Chronic pain syndrome 11/04/2020   Pharmacologic therapy 11/04/2020   Disorder of skeletal system 11/04/2020   Problems influencing health status 11/04/2020   Abnormal MRI, thoracic spine (07/26/2020) 11/04/2020   Abnormal MRI, lumbar spine (07/26/2020) 11/04/2020   Abnormal MRI, cervical spine (01/11/2019) 11/04/2020   Retrolisthesis (T12/L1) & (L1/L2) 11/04/2020   Anterolisthesis of lumbosacral spine (L5/S1) 11/04/2020   Cervical fusion syndrome (ACDF C3-7) 11/04/2020   Chronic low back pain (1ry area of Pain) (Bilateral) (R>L) w/o sciatica 11/04/2020   Chronic mid back pain (Bilateral) (R>L) 11/04/2020   Cervicalgia 11/04/2020   Chronic neck pain (3ry area of Pain) (Bilateral) (R>L) w/ history of cervical spinal surgery 11/04/2020   Cervicogenic headaches (4th area of Pain) (Bilateral) (R>L) 11/04/2020    Occipital headaches (Bilateral) (R>L) 11/04/2020   Chronic upper extremity pain (5th area of Pain) (Bilateral) (R>L) 11/04/2020   Chronic wrist pain (7th area of Pain) (Bilateral) (R>L) 11/04/2020   Primary osteoarthritis involving multiple joints 06/24/2020   Rheumatoid arthritis involving multiple sites with positive rheumatoid factor (HCC) 06/24/2020   Postherpetic neuralgia 04/10/2020   Herpes zoster without complication 04/04/2020   Reactive depression 04/04/2020   Chronic fatigue 02/02/2020   S/P cervical spinal fusion 02/15/2019   Preventative health care 09/08/2018   Ventral hernia without obstruction or gangrene 09/08/2018   History of colonoscopy with polypectomy 09/08/2018   Hx of lower gastrointestinal bleeding 09/08/2018   Obesity (BMI 30.0-34.9) 04/26/2018   COPD (chronic obstructive pulmonary disease) (HCC) 04/26/2018   Arthritis of right hand 04/26/2018   Arthritis of left hand 04/26/2018   DDD (degenerative disc disease), lumbosacral 04/26/2018   Chronic knee pain (6th area of Pain) (Bilateral) (R>L) 04/26/2018   Dry skin 04/26/2018   Tobacco dependence 08/20/2015   Rectal polyp    Cervical radiculopathy 06/22/2015   Displacement of intervertebral disc at C6-C7 level 06/22/2015   Chronic shoulder pain (2ry area of Pain) (Bilateral) (R>L) 06/22/2015    Plan    We discussed the elective nature of this hernia repair.  We also discussed the complexity considering the size of the defect in the volume of intra-abdominal contents that are outside the abdomen and have been outside the abdomen for chronic era. We discussed the role of tobacco cessation.  We discussed the role of pulmonary tuneup and cardiology clearance. We discussed the role of weight loss to diminish the amount of intra-abdominal tension and extra abdominal tension on the intended repair. We discussed that the repair may require abdominal wall reconstruction and may be best performed by a hernia  center/surgeons experienced and abdominal wall reconstruction. It is  critical for him to keep working at this time, and he feels he can be careful with his duties on the job and I would need a significant amount of time i.e. months to prepare him better for an elective procedure.  I do not believe there is any need to refer him to hernia specialist at this time.  I do believe it is critical for him to keep his employment.  He appears to have been carrying out his duties until this hernia was identified without issue. I believe he will be able to continue his duties without exacerbating this hernia has been present for years.  Face-to-face time spent with the patient and accompanying care providers(if present) was 55 minutes, with more than 50% of the time spent counseling, educating, and coordinating care of the patient.    These notes generated with voice recognition software. I apologize for typographical errors.  Campbell Lerner M.D., FACS 12/15/2022, 3:37 PM

## 2022-12-15 ENCOUNTER — Ambulatory Visit (INDEPENDENT_AMBULATORY_CARE_PROVIDER_SITE_OTHER): Payer: BLUE CROSS/BLUE SHIELD | Admitting: Internal Medicine

## 2022-12-15 ENCOUNTER — Encounter: Payer: Self-pay | Admitting: Internal Medicine

## 2022-12-15 ENCOUNTER — Ambulatory Visit: Payer: BLUE CROSS/BLUE SHIELD | Admitting: Surgery

## 2022-12-15 ENCOUNTER — Encounter: Payer: Self-pay | Admitting: Surgery

## 2022-12-15 ENCOUNTER — Other Ambulatory Visit: Payer: Self-pay

## 2022-12-15 VITALS — BP 118/84 | HR 99 | Temp 98.3°F | Ht 70.0 in | Wt 220.0 lb

## 2022-12-15 VITALS — BP 136/78 | HR 92 | Temp 97.9°F | Resp 18 | Ht 70.0 in | Wt 220.8 lb

## 2022-12-15 DIAGNOSIS — Z13220 Encounter for screening for lipoid disorders: Secondary | ICD-10-CM | POA: Diagnosis not present

## 2022-12-15 DIAGNOSIS — J449 Chronic obstructive pulmonary disease, unspecified: Secondary | ICD-10-CM | POA: Diagnosis not present

## 2022-12-15 DIAGNOSIS — Z23 Encounter for immunization: Secondary | ICD-10-CM | POA: Diagnosis not present

## 2022-12-15 DIAGNOSIS — Z1211 Encounter for screening for malignant neoplasm of colon: Secondary | ICD-10-CM

## 2022-12-15 DIAGNOSIS — F17200 Nicotine dependence, unspecified, uncomplicated: Secondary | ICD-10-CM

## 2022-12-15 DIAGNOSIS — E669 Obesity, unspecified: Secondary | ICD-10-CM

## 2022-12-15 DIAGNOSIS — I2089 Other forms of angina pectoris: Secondary | ICD-10-CM | POA: Diagnosis not present

## 2022-12-15 DIAGNOSIS — Z122 Encounter for screening for malignant neoplasm of respiratory organs: Secondary | ICD-10-CM

## 2022-12-15 DIAGNOSIS — K439 Ventral hernia without obstruction or gangrene: Secondary | ICD-10-CM | POA: Diagnosis not present

## 2022-12-15 DIAGNOSIS — F17210 Nicotine dependence, cigarettes, uncomplicated: Secondary | ICD-10-CM

## 2022-12-15 DIAGNOSIS — Z0000 Encounter for general adult medical examination without abnormal findings: Secondary | ICD-10-CM | POA: Diagnosis not present

## 2022-12-15 DIAGNOSIS — Z125 Encounter for screening for malignant neoplasm of prostate: Secondary | ICD-10-CM | POA: Diagnosis not present

## 2022-12-15 NOTE — Patient Instructions (Addendum)
Advised to pursue a goal of 25 to 30 g of fiber daily.  Made aware that the majority of this may be through natural sources, but advised to be aware of actual consumption and to ensure minimal consumption by daily supplementation.  Various forms of supplements discussed.  Recommended Psyllium husk, that mixes well with applesauce, or the powder which goes down well shaken with chocolate milk.  Strongly advised to consume more fluids to ensure adequate hydration, instructed to watch color of urine to determine adequacy of hydration.  Clarity is pursued in urine output, and bowel activity that correlates to significant meal intake.   We need to avoid deferring having bowel movements, advised to take the time at the first sign of sensation, typically following meals, and in the morning.   Subsequent utilization of MiraLAX may be needed ensure at least daily movement, ideally twice daily bowel movements.  If multiple doses of MiraLAX are necessary utilize them. Never skip a day...  To be regular, we must do the above EVERY day.    If you have any concerns or questions, please feel free to call our office. See follow up appointment.   Managing the Challenge of Quitting Smoking Quitting smoking is a physical and mental challenge. You may have cravings, withdrawal symptoms, and temptation to smoke. Before quitting, work with your health care provider to make a plan that can help you manage quitting. Making a plan before you quit may keep you from smoking when you have the urge to smoke while trying to quit. How to manage lifestyle changes Managing stress Stress can make you want to smoke, and wanting to smoke may cause stress. It is important to find ways to manage your stress. You could try some of the following: Practice relaxation techniques. Breathe slowly and deeply, in through your nose and out through your mouth. Listen to music. Soak in a bath or take a shower. Imagine a peaceful place or  vacation. Get some support. Talk with family or friends about your stress. Join a support group. Talk with a counselor or therapist. Get some physical activity. Go for a walk, run, or bike ride. Play a favorite sport. Practice yoga.  Medicines Talk with your health care provider about medicines that might help you deal with cravings and make quitting easier for you. Relationships Social situations can be difficult when you are quitting smoking. To manage this, you can: Avoid parties and other social situations where people might be smoking. Avoid alcohol. Leave right away if you have the urge to smoke. Explain to your family and friends that you are quitting smoking. Ask for support and let them know you might be a bit grumpy. Plan activities where smoking is not an option. General instructions Be aware that many people gain weight after they quit smoking. However, not everyone does. To keep from gaining weight, have a plan in place before you quit, and stick to the plan after you quit. Your plan should include: Eating healthy snacks. When you have a craving, it may help to: Eat popcorn, or try carrots, celery, or other cut vegetables. Chew sugar-free gum. Changing how you eat. Eat small portion sizes at meals. Eat 4-6 small meals throughout the day instead of 1-2 large meals a day. Be mindful when you eat. You should avoid watching television or doing other things that might distract you as you eat. Exercising regularly. Make time to exercise each day. If you do not have time for a long workout,  do short bouts of exercise for 5-10 minutes several times a day. Do some form of strengthening exercise, such as weight lifting. Do some exercise that gets your heart beating and causes you to breathe deeply, such as walking fast, running, swimming, or biking. This is very important. Drinking plenty of water or other low-calorie or no-calorie drinks. Drink enough fluid to keep your urine pale  yellow.  How to recognize withdrawal symptoms Your body and mind may experience discomfort as you try to get used to not having nicotine in your system. These effects are called withdrawal symptoms. They may include: Feeling hungrier than normal. Having trouble concentrating. Feeling irritable or restless. Having trouble sleeping. Feeling depressed. Craving a cigarette. These symptoms may surprise you, but they are normal to have when quitting smoking. To manage withdrawal symptoms: Avoid places, people, and activities that trigger your cravings. Remember why you want to quit. Get plenty of sleep. Avoid coffee and other drinks that contain caffeine. These may worsen some of your symptoms. How to manage cravings Come up with a plan for how to deal with your cravings. The plan should include the following: A definition of the specific situation you want to deal with. An activity or action you will take to replace smoking. A clear idea for how this action will help. The name of someone who could help you with this. Cravings usually last for 5-10 minutes. Consider taking the following actions to help you with your plan to deal with cravings: Keep your mouth busy. Chew sugar-free gum. Suck on hard candies or a straw. Brush your teeth. Keep your hands and body busy. Change to a different activity right away. Squeeze or play with a ball. Do an activity or a hobby, such as making bead jewelry, practicing needlepoint, or working with wood. Mix up your normal routine. Take a short exercise break. Go for a quick walk, or run up and down stairs. Focus on doing something kind or helpful for someone else. Call a friend or family member to talk during a craving. Join a support group. Contact a quitline. Where to find support To get help or find a support group: Call the National Cancer Institute's Smoking Quitline: 1-800-QUIT-NOW (854)872-2158) Text QUIT to SmokefreeTXT: 454098 Where to find  more information Visit these websites to find more information on quitting smoking: U.S. Department of Health and Human Services: www.smokefree.gov American Lung Association: www.freedomfromsmoking.org Centers for Disease Control and Prevention (CDC): FootballExhibition.com.br American Heart Association: www.heart.org Contact a health care provider if: You want to change your plan for quitting. The medicines you are taking are not helping. Your eating feels out of control or you cannot sleep. You feel depressed or become very anxious. Summary Quitting smoking is a physical and mental challenge. You will face cravings, withdrawal symptoms, and temptation to smoke again. Preparation can help you as you go through these challenges. Try different techniques to manage stress, handle social situations, and prevent weight gain. You can deal with cravings by keeping your mouth busy (such as by chewing gum), keeping your hands and body busy, calling family or friends, or contacting a quitline for people who want to quit smoking. You can deal with withdrawal symptoms by avoiding places where people smoke, getting plenty of rest, and avoiding drinks that contain caffeine. This information is not intended to replace advice given to you by your health care provider. Make sure you discuss any questions you have with your health care provider. Document Revised: 06/13/2021 Document Reviewed: 06/13/2021  Elsevier Patient Education  2024 Elsevier Inc.  

## 2022-12-16 LAB — LIPID PANEL
Cholesterol: 185 mg/dL (ref <200)
HDL: 58 mg/dL (ref >=40)
LDL Cholesterol (Calc): 108 mg/dL (calc) — ABNORMAL HIGH
Non-HDL Cholesterol (Calc): 127 mg/dL (calc) (ref <130)
Total CHOL/HDL Ratio: 3.2 (calc) (ref <5.0)
Triglycerides: 94 mg/dL (ref <150)

## 2022-12-16 LAB — PSA: PSA: 0.65 ng/mL (ref <=4.00)

## 2022-12-18 NOTE — Telephone Encounter (Signed)
Error

## 2022-12-24 ENCOUNTER — Encounter: Payer: Self-pay | Admitting: *Deleted

## 2022-12-29 ENCOUNTER — Ambulatory Visit: Payer: Self-pay | Admitting: *Deleted

## 2022-12-29 NOTE — Telephone Encounter (Signed)
  Chief Complaint: BAck pain Symptoms: Chronic back pain 10/10 "For years" Frequency: Years Pertinent Negatives: Patient denies  Disposition: [] ED /[] Urgent Care (no appt availability in office) / [] Appointment(In office/virtual)/ []  [place] [facility]/ [] Home Care/ [] Refused Recommended Disposition /[] [place] [facility]/ [x]  Follow-up with PCP Additional Notes: Pt states chronic issue. States gradual worsening. Has been to pain clinic, had injections, P.T. and chiropractors, "Nothing helps and the surgeon says surgery won't help."  Pt states he would like to apply for disability, and "Don't know where to start."  States pain is resulting in falls. Pt had annual OV 12/15/22 Please advise.  Reason for Disposition  Back pain is a chronic symptom (recurrent or ongoing AND present > 4 weeks)  Answer Assessment - Initial Assessment Questions 1. ONSET: "When did the pain begin?"      "Years ago" 2. LOCATION: "Where does it hurt?" (upper, mid or lower back)     Lower back 3. SEVERITY: "How bad is the pain?"  (e.g., Scale 1-10; mild, moderate, or severe)   - MILD (1-3): Doesn't interfere with normal activities.    - MODERATE (4-7): Interferes with normal activities or awakens from sleep.    - SEVERE (8-10): Excruciating pain, unable to do any normal activities.      10/10 4. PATTERN: "Is the pain constant?" (e.g., yes, no; constant, intermittent)      Constant 5. RADIATION: "Does the pain shoot into your legs or somewhere else?"     Legs, feet 6. CAUSE:  "What do you think is causing the back pain?"      Discs 7. BACK OVERUSE:  "Any recent lifting of heavy objects, strenuous work or exercise?"     At work but not the problem, years before 8. MEDICINES: "What have you taken so far for the pain?" (e.g., nothing, acetaminophen, NSAIDS)     None 9. NEUROLOGIC SYMPTOMS: "Do you have any weakness, numbness, or problems with bowel/bladder control?"     no 10. OTHER SYMPTOMS: "Do you have  any other symptoms?" (e.g., fever, abdomen pain, burning with urination, blood in urine)       no  Protocols used: Back Pain-A-AH

## 2022-12-30 ENCOUNTER — Institutional Professional Consult (permissible substitution): Payer: BLUE CROSS/BLUE SHIELD | Admitting: Student in an Organized Health Care Education/Training Program

## 2023-01-01 ENCOUNTER — Telehealth: Payer: Self-pay

## 2023-01-01 NOTE — Telephone Encounter (Signed)
Pt left vmm to schedule colonoscopy please return call 

## 2023-01-06 ENCOUNTER — Other Ambulatory Visit: Payer: Self-pay | Admitting: *Deleted

## 2023-01-06 ENCOUNTER — Telehealth: Payer: Self-pay | Admitting: *Deleted

## 2023-01-06 DIAGNOSIS — Z86010 Personal history of colonic polyps: Secondary | ICD-10-CM

## 2023-01-06 MED ORDER — NA SULFATE-K SULFATE-MG SULF 17.5-3.13-1.6 GM/177ML PO SOLN: 1.0000 | Freq: Once | ORAL | 0 refills | Status: AC

## 2023-01-06 NOTE — Telephone Encounter (Signed)
Colonoscopy schedule on 02/26/2023 with Dr Tobi Bastos

## 2023-01-06 NOTE — Telephone Encounter (Signed)
Gastroenterology Pre-Procedure Review  Request Date: 02/26/2023 Requesting Physician: Dr. Tobi Bastos  PATIENT REVIEW QUESTIONS: The patient responded to the following health history questions as indicated:    1. Are you having any GI issues? no 2. Do you have a personal history of Polyps? yes (02/14/2021) 3. Do you have a family history of Colon Cancer or Polyps? no 4. Diabetes Mellitus? no 5. Joint replacements in the past 12 months?no 6. Major health problems in the past 3 months?no 7. Any artificial heart valves, MVP, or defibrillator?no    MEDICATIONS & ALLERGIES:    Patient reports the following regarding taking any anticoagulation/antiplatelet therapy:   Plavix, Coumadin, Eliquis, Xarelto, Lovenox, Pradaxa, Brilinta, or Effient? no Aspirin? no  Patient confirms/reports the following medications:  Current Outpatient Medications  Medication Sig Dispense Refill   albuterol (VENTOLIN HFA) 108 (90 Base) MCG/ACT inhaler Inhale 2 puffs into the lungs every 6 (six) hours as needed for wheezing or shortness of breath. 8 g 2   fluticasone-salmeterol (ADVAIR DISKUS) 250-50 MCG/ACT AEPB Inhale 1 puff into the lungs in the morning and at bedtime. 60 each 2   predniSONE (DELTASONE) 10 MG tablet Take 1 tablet (10 mg total) by mouth daily. Day 1-3: take 4 tablets PO daily Day 4-6: take 3 tablets PO daily Day 7-9: take 2 tablets PO daily Day 10-12: take 1 tablet PO daily 30 tablet 0   No current facility-administered medications for this visit.    Patient confirms/reports the following allergies:  No Known Allergies  No orders of the defined types were placed in this encounter.   AUTHORIZATION INFORMATION Primary Insurance: 1D#: Group #:  Secondary Insurance: 1D#: Group #:  SCHEDULE INFORMATION: Date: 02/26/2023 Time: Location:  [facility]

## 2023-01-15 ENCOUNTER — Ambulatory Visit: Payer: Self-pay | Admitting: *Deleted

## 2023-01-15 NOTE — Telephone Encounter (Signed)
  Chief Complaint: Wanting to discuss getting on disability for chronic back and leg pain from lack of cartilage in his lower back. Symptoms: Pain in lower back and down both legs that is getting worse. Frequency: For the last 2 years. Pertinent Negatives: Patient denies N/A Disposition: [] ED /[] Urgent Care (no appt availability in office) / [x] Appointment(In office/virtual)/ []  [facility]/ [] Home Care/ [] Refused Recommended Disposition /[] [facility]/ []  Follow-up with PCP Additional Notes: Appt made with Dr. Caralee Ates for 01/18/2023 at 8:20.

## 2023-01-15 NOTE — Progress Notes (Signed)
Acute Office Visit  Subjective:     Patient ID: Peter [name], male    DOB: 07/07/1965, 57 y.o.   MRN: [id number]  Chief Complaint  Patient presents with   Numbness    In arm and leg, wants to discuss disability    HPI Patient is in today for numbness and tingling.   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 Marcell Barlow in 2021, but was not interested in surgery at the time -Goes to chiropractor 3x a week but not helping  MDD: -Mood status: worse -Current treatment: Nothing     01/18/2023    8:23 AM 12/15/2022    9:13 AM 11/26/2022   11:10 AM 06/12/2021   10:07 AM 05/22/2021    8:24 AM  Depression screen PHQ 2/9  Decreased Interest 3 3 3  0 0  Down, Depressed, Hopeless 3 3 2  0 0  PHQ - 2 Score 6 6 5  0 0  Altered sleeping 3 0 1    Tired, decreased energy 3 2 3     Change in appetite 2 2 2     Feeling bad or failure about yourself  2 2 1     Trouble concentrating 2 2 1     Moving slowly or fidgety/restless 2 0 0    Suicidal thoughts 2 0 0    PHQ-9 Score 22 14 13     Difficult doing work/chores Extremely dIfficult Somewhat difficult Very difficult       Review of Systems  Constitutional:  Negative for chills and fever.  Musculoskeletal:  Positive for back pain and joint pain.  Neurological:  Positive for tingling,  sensory change and weakness.        Objective:    BP 136/84   Pulse 86   Temp 98.1 F (36.7 C)   Resp 18   Ht 5\' 10"  (1.778 m)   Wt 221 lb 14.4 oz (100.7 kg)   SpO2 92%   BMI 31.84 kg/m  BP Readings from Last 3 Encounters:  01/18/23 136/84  12/15/22 118/84  12/15/22 136/78   Wt Readings from Last 3 Encounters:  01/18/23 221 lb 14.4 oz (100.7 kg)  12/15/22 220 lb (99.8 kg)  12/15/22 220 lb 12.8 oz (100.2 kg)      Physical Exam Constitutional:      Appearance: Normal appearance.  HENT:     Head: Normocephalic and atraumatic.  Eyes:     Conjunctiva/sclera: Conjunctivae normal.  Cardiovascular:     Rate and Rhythm: Normal rate and regular rhythm.  Pulmonary:     Effort: Pulmonary effort is normal.     Breath sounds: Normal breath sounds.  Musculoskeletal:     Left shoulder: Tenderness and bony tenderness present. No swelling. Decreased range of motion. Decreased strength.     Lumbar back: Tenderness present. No spasms. Decreased range of motion.     Comments: Lumbar spine: decreased ROM with extension, bilateral SB  Skin:    General: Skin is warm and dry.  Neurological:     General: No focal deficit present.     Mental Status: He is alert. Mental status is at baseline.  Psychiatric:        Mood and Affect: Mood normal.        Behavior: Behavior normal.     No results found for any visits on 01/18/23.      Assessment & Plan:   1. Chronic left shoulder pain: Pain is primary coming from shoulder joint with compression symptoms down left arm, consistent with nerve entrapment. Will obtain x-ray of the shoulder to evaluate for arthritis.   - DG Shoulder Left; Future - DULoxetine (CYMBALTA) 30 MG capsule; Take 1 capsule (30 mg total) by mouth daily.  Dispense: 30 capsule; Refill: 1   2. Chronic bilateral low back pain with bilateral sciatica: Obtain updated x-ray, may require MRI. Patient has been evaluated by pain management and neurosurgery in the past  without results. Will obtain labs to rule out metabolic cause of numbness and tingling and imaging.   - DG Lumbar Spine Complete; Future - DULoxetine (CYMBALTA) 30 MG capsule; Take 1 capsule (30 mg total) by mouth daily.  Dispense: 30 capsule; Refill: 1 - TSH - HgB A1c - Vitamin B12 - Vitamin B1  3. Episode of recurrent major depressive disorder, unspecified depression episode severity [facility]): Treat neuropathic pain and depression with Cymbalta, start at 30 mg, recheck in 6 weeks.  - DULoxetine (CYMBALTA) 30 MG capsule; Take 1 capsule (30 mg total) by mouth daily.  Dispense: 30 capsule; Refill: 1   Return in about 6 weeks (around 03/01/2023).  Margarita Mail, DO

## 2023-01-15 NOTE — Telephone Encounter (Signed)
Reason for Disposition  [1] Numbness or tingling in one or both feet AND [2] is a chronic symptom (recurrent or ongoing AND present > 4 weeks)    Has known cartilage problems in lower back that they can't do anything for.   Wanting to discuss getting disability.  Answer Assessment - Initial Assessment Questions 1. SYMPTOM: "What is the main symptom you are concerned about?" (e.g., weakness, numbness)     Having numbness from left shoulder to leg.   I was dx a couple of years ago.   I don't have cartilage in my lower back and it pinches my lower nerves.  It's been bothering me bad lately.   My job requires a lot of walking and bending.   I want to get disability.    2. ONSET: "When did this start?" (minutes, hours, days; while sleeping)     I've been putting it off and it's getting worse and worse.   My company is saying they need people that can physically do the job.  So I figure I better get in and get the disability started before they tell me I'm gone. 3. LAST NORMAL: "When was the last time you (the patient) were normal (no symptoms)?"     This has been going on for a couple of years now but getting worse. 4. PATTERN "Does this come and go, or has it been constant since it started?"  "Is it present now?"     It's constant at this point. 5. CARDIAC SYMPTOMS: "Have you had any of the following symptoms: chest pain, difficulty breathing, palpitations?"     Not asked 6. NEUROLOGIC SYMPTOMS: "Have you had any of the following symptoms: headache, dizziness, vision loss, double vision, changes in speech, unsteady on your feet?"     Pain and numbness in my legs and lower back pain.   This is not new but I'm afraid I'm about to lose my job. 7. OTHER SYMPTOMS: "Do you have any other symptoms?"     No 8. PREGNANCY: "Is there any chance you are pregnant?" "When was your last menstrual period?"     N/A  Protocols used: Neurologic Deficit-A-AH

## 2023-01-18 ENCOUNTER — Ambulatory Visit
Admission: RE | Admit: 2023-01-18 | Discharge: 2023-01-18 | Disposition: A | Discharge status: Home / Self Care | Payer: BLUE CROSS/BLUE SHIELD | Attending: Internal Medicine | Admitting: Internal Medicine

## 2023-01-18 ENCOUNTER — Ambulatory Visit: Payer: BLUE CROSS/BLUE SHIELD | Admitting: Internal Medicine

## 2023-01-18 ENCOUNTER — Ambulatory Visit: Admission: RE | Admit: 2023-01-18 | Payer: BLUE CROSS/BLUE SHIELD | Source: Ambulatory Visit

## 2023-01-18 ENCOUNTER — Encounter: Payer: Self-pay | Admitting: Internal Medicine

## 2023-01-18 VITALS — BP 136/84 | HR 86 | Temp 98.1°F | Resp 18 | Ht 70.0 in | Wt 221.9 lb

## 2023-01-18 DIAGNOSIS — G8929 Other chronic pain: Secondary | ICD-10-CM | POA: Insufficient documentation

## 2023-01-18 DIAGNOSIS — F339 Major depressive disorder, recurrent, unspecified: Secondary | ICD-10-CM

## 2023-01-18 DIAGNOSIS — M5442 Lumbago with sciatica, left side: Secondary | ICD-10-CM | POA: Insufficient documentation

## 2023-01-18 DIAGNOSIS — M25512 Pain in left shoulder: Secondary | ICD-10-CM | POA: Diagnosis not present

## 2023-01-18 DIAGNOSIS — M5441 Lumbago with sciatica, right side: Secondary | ICD-10-CM | POA: Insufficient documentation

## 2023-01-18 MED ORDER — DULOXETINE HCL 30 MG PO CPEP
30.0000 mg | ORAL_CAPSULE | Freq: Every day | ORAL | 1 refills | Status: DC
Start: 2023-01-18 — End: 2023-02-11

## 2023-01-23 LAB — VITAMIN B1: Vitamin B1 (Thiamine): 9 nmol/L (ref 8–30)

## 2023-01-23 LAB — HEMOGLOBIN A1C
Hgb A1c MFr Bld: 5.7 % of total Hgb — ABNORMAL HIGH (ref <5.7)
Mean Plasma Glucose: 117 mg/dL
eAG (mmol/L): 6.5 mmol/L

## 2023-01-23 LAB — TSH: TSH: 1.47 mIU/L (ref 0.40–4.50)

## 2023-01-23 LAB — VITAMIN B12: Vitamin B-12: 326 pg/mL (ref 200–1100)

## 2023-01-26 ENCOUNTER — Encounter: Payer: Self-pay | Admitting: Emergency Medicine

## 2023-01-26 NOTE — Telephone Encounter (Signed)
Pt decided to call and schedule lung cancer screening before ins runs out if he can. # given [phone]

## 2023-01-26 NOTE — Telephone Encounter (Signed)
Copied from CRM [id number]. Topic: General - Other >> Jan 26, 2023 11:35 AM [name] A wrote: Reason for CRM: Pt is calling back to speak with his PCP nurse. Per pt he has lost his job but did find out his insurance is good until 02/02/2023 and is wanting to have the cancer test done before then while he still has insurance. Please call pt back.

## 2023-02-10 ENCOUNTER — Other Ambulatory Visit: Payer: Self-pay | Admitting: Internal Medicine

## 2023-02-10 DIAGNOSIS — F339 Major depressive disorder, recurrent, unspecified: Secondary | ICD-10-CM

## 2023-02-10 DIAGNOSIS — G8929 Other chronic pain: Secondary | ICD-10-CM

## 2023-02-11 NOTE — Telephone Encounter (Signed)
Requested Prescriptions  Pending Prescriptions Disp Refills   DULoxetine (CYMBALTA) 30 MG capsule [Pharmacy Med Name: DULOXETINE HCL DR 30 MG CAP] 90 capsule 3    Sig: TAKE 1 CAPSULE BY MOUTH EVERY DAY     Psychiatry: Antidepressants - SNRI - duloxetine Passed - 02/10/2023  9:30 AM      Passed - Cr in normal range and within 360 days    Creat  Date Value Ref Range Status  02/02/2020 1.06 0.70 - 1.33 mg/dL Final    Comment:    For patients >57 years of age, the reference limit for Creatinine is approximately 13% higher for people identified as African-American. .    Creatinine, Ser  Date Value Ref Range Status  12/09/2022 0.93 0.61 - 1.24 mg/dL Final         Passed - eGFR is 30 or above and within 360 days    GFR, Est African American  Date Value Ref Range Status  02/02/2020 92 > OR = 60 mL/min/1.53m2 Final   GFR, Est Non African American  Date Value Ref Range Status  02/02/2020 80 > OR = 60 mL/min/1.82m2 Final   GFR, Estimated  Date Value Ref Range Status  12/09/2022 >60 >60 mL/min Final    Comment:    (NOTE) Calculated using the CKD-EPI Creatinine Equation (2021)    eGFR  Date Value Ref Range Status  01/14/2021 91 >59 mL/min/1.73 Final         Passed - Completed PHQ-2 or PHQ-9 in the last 360 days      Passed - Last BP in normal range    BP Readings from Last 1 Encounters:  01/18/23 136/84         Passed - Valid encounter within last 6 months    Recent Outpatient Visits           3 weeks ago Chronic bilateral low back pain with bilateral sciatica   [facility] Margarita Mail, DO   1 month ago Annual physical exam   [facility] Margarita Mail, DO   2 months ago Chronic obstructive pulmonary disease with acute lower respiratory infection (HCC)   [facility] Mecum, Oswaldo Conroy, PA-C   2 years ago Adult general medical exam   [facility] Danelle Berry,  PA-C   2 years ago Upper respiratory tract infection due to COVID-19 virus   [facility] Danelle Berry, [place]       Future Appointments             In 2 weeks Agbor-Etang, Arlys John, MD [facility] [place]   In 10 months Margarita Mail, DO [facility]

## 2023-02-25 ENCOUNTER — Ambulatory Visit: Payer: BLUE CROSS/BLUE SHIELD | Attending: Cardiology | Admitting: Cardiology

## 2023-02-26 ENCOUNTER — Encounter: Payer: Self-pay | Admitting: Cardiology

## 2023-02-26 ENCOUNTER — Ambulatory Visit
Admission: RE | Admit: 2023-02-26 | Discharge: 2023-02-26 | Disposition: A | Discharge status: Home / Self Care | Payer: BLUE CROSS/BLUE SHIELD | Attending: Gastroenterology | Admitting: Gastroenterology

## 2023-02-26 ENCOUNTER — Encounter: Payer: Self-pay | Admitting: Gastroenterology

## 2023-02-26 ENCOUNTER — Ambulatory Visit: Payer: BLUE CROSS/BLUE SHIELD | Admitting: Certified Registered"

## 2023-02-26 ENCOUNTER — Encounter
Admission: RE | Disposition: A | Discharge status: Home / Self Care | Payer: Self-pay | Source: Home / Self Care | Attending: Gastroenterology

## 2023-02-26 DIAGNOSIS — J449 Chronic obstructive pulmonary disease, unspecified: Secondary | ICD-10-CM | POA: Diagnosis not present

## 2023-02-26 DIAGNOSIS — Z1211 Encounter for screening for malignant neoplasm of colon: Secondary | ICD-10-CM | POA: Diagnosis present

## 2023-02-26 DIAGNOSIS — F17210 Nicotine dependence, cigarettes, uncomplicated: Secondary | ICD-10-CM | POA: Insufficient documentation

## 2023-02-26 DIAGNOSIS — Z86010 Personal history of colonic polyps: Secondary | ICD-10-CM

## 2023-02-26 DIAGNOSIS — Z980 Intestinal bypass and anastomosis status: Secondary | ICD-10-CM | POA: Diagnosis not present

## 2023-02-26 DIAGNOSIS — F32A Depression, unspecified: Secondary | ICD-10-CM | POA: Diagnosis not present

## 2023-02-26 HISTORY — PX: COLONOSCOPY WITH PROPOFOL: SHX5780

## 2023-02-26 SURGERY — COLONOSCOPY WITH PROPOFOL
Anesthesia: General

## 2023-02-26 MED ORDER — STERILE WATER FOR IRRIGATION IR SOLN
Status: DC | PRN
  Administered 2023-02-26: 60 mL

## 2023-02-26 MED ORDER — PROPOFOL 500 MG/50ML IV EMUL
INTRAVENOUS | Status: DC | PRN
  Administered 2023-02-26: 135 ug/kg/min via INTRAVENOUS

## 2023-02-26 MED ORDER — SODIUM CHLORIDE 0.9 % IV SOLN
INTRAVENOUS | Status: DC
  Administered 2023-02-26: 1000 mL via INTRAVENOUS

## 2023-02-26 MED ORDER — PROPOFOL 10 MG/ML IV BOLUS
INTRAVENOUS | Status: DC | PRN
  Administered 2023-02-26: 30 mg via INTRAVENOUS

## 2023-02-26 NOTE — Anesthesia Postprocedure Evaluation (Signed)
Anesthesia Post Note  Patient: Scientist, research (life sciences)  Procedure(s) Performed: COLONOSCOPY WITH PROPOFOL  Patient location during evaluation: Endoscopy Anesthesia Type: General Level of consciousness: awake and alert Pain management: pain level controlled Vital Signs Assessment: post-procedure vital signs reviewed and stable Respiratory status: spontaneous breathing, nonlabored ventilation, respiratory function stable and patient connected to nasal cannula oxygen Cardiovascular status: blood pressure returned to baseline and stable Postop Assessment: no apparent nausea or vomiting Anesthetic complications: no   No notable events documented.   Last Vitals:  Vitals:   02/26/23 0955 02/26/23 1004  BP:  106/85  Pulse: 80 99  Resp: 11 14  Temp:    SpO2: 100% 100%    Last Pain:  Vitals:   02/26/23 0955  TempSrc:   PainSc: 0-No pain                 Louie Boston

## 2023-02-26 NOTE — Anesthesia Procedure Notes (Signed)
Procedure Name: MAC Date/Time: 02/26/2023 9:33 AM  Performed by: Nelle Don, CRNAPre-anesthesia Checklist: Patient identified, Emergency Drugs available, Suction available and Patient being monitored Oxygen Delivery Method: Simple face mask

## 2023-02-26 NOTE — Op Note (Signed)
[facility] Gastroenterology Patient Name: Peter [name] Procedure Date: 02/26/2023 9:24 AM MRN: [id number] Account #: [id number] Date of Birth: Feb 14, 1966 Admit Type: Outpatient Age: 57 Room: [facility] ENDO ROOM 4 Gender: Male Note Status: Finalized Instrument Name: Prentice Docker [id number] Procedure:             Colonoscopy Indications:           Screening for colorectal malignant neoplasm Providers:             [name] MD, MD Referring MD:          Margarita Mail (Referring MD) Medicines:             Monitored Anesthesia Care Complications:         No immediate complications. Procedure:             Pre-Anesthesia Assessment:                        - Prior to the procedure, a History and Physical was                         performed, and patient medications, allergies and                         sensitivities were reviewed. The patient's tolerance                         of previous anesthesia was reviewed.                        - The risks and benefits of the procedure and the                         sedation options and risks were discussed with the                         patient. All questions were answered and informed                         consent was obtained.                        - ASA Grade Assessment: II - A patient with mild                         systemic disease.                        After obtaining informed consent, the colonoscope was                         passed under direct vision. Throughout the procedure,                         the patient's blood pressure, pulse, and oxygen                         saturations were monitored continuously. The                         Colonoscope was introduced through  the anus and                         advanced to the the cecum, identified by the                         appendiceal orifice. The colonoscopy was performed                         with ease. The patient tolerated the procedure well.                          The quality of the bowel preparation was good. The                         ileocecal valve, appendiceal orifice, and rectum were                         photographed. Findings:      The perianal and digital rectal examinations were normal.      There was evidence of a prior end-to-end colo-colonic anastomosis in the       sigmoid colon. This was characterized by healthy appearing mucosa.      The exam was otherwise without abnormality on direct and retroflexion       views. Impression:            - End-to-end colo-colonic anastomosis, characterized                         by healthy appearing mucosa.                        - The examination was otherwise normal on direct and                         retroflexion views.                        - No specimens collected. Recommendation:        - Discharge patient to home (with escort).                        - Resume previous diet.                        - Continue present medications.                        - Await pathology results.                        - Repeat colonoscopy in 10 years for screening                         purposes. Procedure Code(s):     --- Professional ---                        [id number], Colonoscopy, flexible; diagnostic, including                         collection of specimen(s)  by brushing or washing, when                         performed (separate procedure) Diagnosis Code(s):     --- Professional ---                        Z12.11, Encounter for screening for malignant neoplasm                         of colon                        Z98.0, Intestinal bypass and anastomosis status CPT copyright 2022 American Medical Association. All rights reserved. The codes documented in this report are preliminary and upon coder review may  be revised to meet current compliance requirements. [name], MD [name] MD, MD 02/26/2023 9:52:03 AM This report has been signed electronically. Number of Addenda:  0 Note Initiated On: 02/26/2023 9:24 AM Scope Withdrawal Time: 0 hours 6 minutes 13 seconds  Total Procedure Duration: 0 hours 14 minutes 19 seconds  Estimated Blood Loss:  Estimated blood loss: none.      [facility]

## 2023-02-26 NOTE — H&P (Signed)
[name], MD [address], [address], [place], [place], [address] [address], [address], [place], [place], [address] Phone: [phone]  Fax: [phone]  Primary Care Physician:  Margarita Mail, DO   Pre-Procedure History & Physical: HPI:  Peter [name] is a 57 y.o. male is here for an colonoscopy.   Past Medical History:  Diagnosis Date   Arthritis    neck, shoulders   COPD (chronic obstructive pulmonary disease) (HCC)    Diverticulitis    Generalized headaches    GERD (gastroesophageal reflux disease)    occ   History of colonoscopy with polypectomy 09/08/2018   Aug 16, 2015   Lower back pain    Rectal pain    Thrombosed hemorrhoids     Past Surgical History:  Procedure Laterality Date   ANTERIOR CERVICAL DECOMPRESSION/DISCECTOMY FUSION 4 LEVELS N/A 02/15/2019   Procedure: ANTERIOR CERVICAL DECOMPRESSION/DISCECTOMY FUSION 4 LEVELS C3-7;  Surgeon: Venetia Night, MD;  Location: [facility] ORS;  Service: Neurosurgery;  Laterality: N/A;   APPENDECTOMY  1988   BLADDER REPAIR N/A 09/11/2015   Procedure: BLADDER REPAIR;  Surgeon: Hildred Laser, MD;  Location: [facility] ORS;  Service: Urology;  Laterality: N/A;   CHOLECYSTECTOMY  1989   COLON SURGERY     for diverticulitis   COLONOSCOPY N/A 02/14/2021   Procedure: COLONOSCOPY;  Surgeon: Pasty Spillers, MD;  Location: [facility] ENDOSCOPY;  Service: Endoscopy;  Laterality: N/A;   COLONOSCOPY WITH PROPOFOL N/A 08/16/2015   Procedure: COLONOSCOPY WITH PROPOFOL;  Surgeon: Midge Minium, MD;  Location: [facility];  Service: Endoscopy;  Laterality: N/A;   CYSTOSCOPY WITH STENT PLACEMENT Bilateral 09/11/2015   Procedure: CYSTOSCOPY WITH STENT PLACEMENT;  Surgeon: Hildred Laser, MD;  Location: [facility];  Service: Urology;  Laterality: Bilateral;   ILEOSTOMY CLOSURE N/A 11/19/2015   Procedure: ILEOSTOMY TAKEDOWN;  Surgeon: Gladis Riffle, MD;  Location: [facility];  Service: General;  Laterality: N/A;    LAPAROSCOPIC PARTIAL COLECTOMY N/A 09/11/2015   Procedure: LAPAROSCOPIC PARTIAL COLECTOMY Possible Open, possible ostomy, repair of colovesical fistula Cystoscopy and stent placement, repair of colovesical fistula with Dr. Sherryl Barters as well ;  Surgeon: Gladis Riffle, MD;  Location: [facility] ORS;  Service: General;  Laterality: N/A;   POLYPECTOMY  08/16/2015   Procedure: POLYPECTOMY;  Surgeon: Midge Minium, MD;  Location: [facility];  Service: Endoscopy;;   TONSILLECTOMY  1970    Prior to Admission medications   Medication Sig Start Date End Date Taking? Authorizing Provider  DULoxetine (CYMBALTA) 30 MG capsule TAKE 1 CAPSULE BY MOUTH EVERY DAY 02/11/23  Yes Margarita Mail, DO  fluticasone-salmeterol (ADVAIR DISKUS) 250-50 MCG/ACT AEPB Inhale 1 puff into the lungs in the morning and at bedtime. 11/26/22  Yes Mecum, Erin E, PA-C  albuterol (VENTOLIN HFA) 108 (90 Base) MCG/ACT inhaler Inhale 2 puffs into the lungs every 6 (six) hours as needed for wheezing or shortness of breath. 11/26/22   Mecum, Erin E, PA-C    Allergies as of 01/06/2023   (No Known Allergies)    Family History  Problem Relation Age of Onset   Cholecystitis Mother    Lung cancer Father    Emphysema Paternal Grandmother    Diabetes Paternal Grandmother    Prostate cancer Maternal Uncle    Diabetes Brother    Hypertension Brother     Social History   Socioeconomic History   Marital status: Single    Spouse name: Not on file   Number of children:  Not on file   Years of education: Not on file   Highest education level: GED or equivalent  Occupational History   Not on file  Tobacco Use   Smoking status: Every Day    Current packs/day: 1.50    Average packs/day: 1.5 packs/day for 43.6 years (65.5 ttl pk-yrs)    Types: Cigarettes    Start date: 20   Smokeless tobacco: Never  Vaping Use   Vaping status: Never Used  Substance and Sexual Activity   Alcohol use: Not Currently    Comment: Occasionally    Drug use: Not Currently    Types: Marijuana    Comment: has not used any in 2 months   Sexual activity: Not Currently  Other Topics Concern   Not on file  Social History Narrative   Not on file   Social Determinants of Health   Financial Resource Strain: Low Risk  (12/15/2022)   Overall Financial Resource Strain (CARDIA)    Difficulty of Paying Living Expenses: Not hard at all  Food Insecurity: No Food Insecurity (12/15/2022)   Hunger Vital Sign    Worried About Running Out of Food in the Last Year: Never true    Ran Out of Food in the Last Year: Never true  Transportation Needs: No Transportation Needs (12/15/2022)   PRAPARE - Administrator, Civil Service (Medical): No    Lack of Transportation (Non-Medical): No  Physical Activity: Inactive (12/15/2022)   Exercise Vital Sign    Days of Exercise per Week: 0 days    Minutes of Exercise per Session: 0 min  Stress: Stress Concern Present (12/15/2022)   [employer] of Occupational Health - Occupational Stress Questionnaire    Feeling of Stress : To some extent  Social Connections: Socially Isolated (12/15/2022)   Social Connection and Isolation Panel [NHANES]    Frequency of Communication with Friends and Family: More than three times a week    Frequency of Social Gatherings with Friends and Family: More than three times a week    Attends Religious Services: Never    Database administrator or Organizations: No    Attends Banker Meetings: Never    Marital Status: Never married  Intimate Partner Violence: Not At Risk (12/15/2022)   Humiliation, Afraid, Rape, and Kick questionnaire    Fear of Current or Ex-Partner: No    Emotionally Abused: No    Physically Abused: No    Sexually Abused: No    Review of Systems: See HPI, otherwise negative ROS  Physical Exam: BP 126/86   Pulse 77   Temp (!) 96.4 F (35.8 C) (Temporal)   Resp 18   Ht 5\' 10"  (1.778 m)   Wt 97.4 kg   SpO2 99%   BMI 30.80 kg/m   General:   Alert,  pleasant and cooperative in NAD Head:  Normocephalic and atraumatic. Neck:  Supple; no masses or thyromegaly. Lungs:  Clear throughout to auscultation, normal respiratory effort.    Heart:  +S1, +S2, Regular rate and rhythm, No edema. Abdomen:  Soft, nontender and nondistended. Normal bowel sounds, without guarding, and without rebound.   Neurologic:  Alert and  oriented x4;  grossly normal neurologically.  Impression/Plan: Octaviano Batty is here for an colonoscopy to be performed for Screening colonoscopy average risk   Risks, benefits, limitations, and alternatives regarding  colonoscopy have been reviewed with the patient.  Questions have been answered.  All parties agreeable.   [name], MD  02/26/2023, 9:32 AM

## 2023-02-26 NOTE — Anesthesia Preprocedure Evaluation (Signed)
Anesthesia Evaluation  Patient identified by MRN, date of birth, ID band Patient awake    Reviewed: Allergy & Precautions, NPO status , Patient's Chart, lab work & pertinent test results  History of Anesthesia Complications Negative for: history of anesthetic complications  Airway Mallampati: III  TM Distance: >3 FB Neck ROM: full    Dental no notable dental hx.    Pulmonary COPD,  COPD inhaler, Current Smoker and Patient abstained from smoking.   Pulmonary exam normal        Cardiovascular negative cardio ROS Normal cardiovascular exam     Neuro/Psych  Headaches PSYCHIATRIC DISORDERS  Depression     Neuromuscular disease    GI/Hepatic Neg liver ROS,GERD  Controlled,,  Endo/Other  negative endocrine ROS    Renal/GU negative Renal ROS  negative genitourinary   Musculoskeletal  (+) Arthritis ,    Abdominal   Peds  Hematology negative hematology ROS (+)   Anesthesia Other Findings Past Medical History: No date: Arthritis     Comment:  neck, shoulders No date: COPD (chronic obstructive pulmonary disease) (HCC) No date: Diverticulitis No date: Generalized headaches No date: GERD (gastroesophageal reflux disease)     Comment:  occ 09/08/2018: History of colonoscopy with polypectomy     Comment:  Aug 16, 2015 No date: Lower back pain No date: Rectal pain No date: Thrombosed hemorrhoids  Past Surgical History: 02/15/2019: ANTERIOR CERVICAL DECOMPRESSION/DISCECTOMY FUSION 4  LEVELS; N/A     Comment:  Procedure: ANTERIOR CERVICAL DECOMPRESSION/DISCECTOMY               FUSION 4 LEVELS C3-7;  Surgeon: Venetia Night, MD;                Location: [facility];  Service: Neurosurgery;  Laterality:               N/A; 1988: APPENDECTOMY 09/11/2015: BLADDER REPAIR; N/A     Comment:  Procedure: BLADDER REPAIR;  Surgeon: Hildred Laser,              MD;  Location: [facility];  Service: Urology;  Laterality:                N/A; 1989: CHOLECYSTECTOMY No date: COLON SURGERY     Comment:  for diverticulitis 02/14/2021: COLONOSCOPY; N/A     Comment:  Procedure: COLONOSCOPY;  Surgeon: Pasty Spillers,               MD;  Location: [facility];  Service: Endoscopy;                Laterality: N/A; 08/16/2015: COLONOSCOPY WITH PROPOFOL; N/A     Comment:  Procedure: COLONOSCOPY WITH PROPOFOL;  Surgeon: Midge Minium, MD;  Location: [facility];  Service:               Endoscopy;  Laterality: N/A; 09/11/2015: CYSTOSCOPY WITH STENT PLACEMENT; Bilateral     Comment:  Procedure: CYSTOSCOPY WITH STENT PLACEMENT;  Surgeon:               Hildred Laser, MD;  Location: [facility];  Service:               Urology;  Laterality: Bilateral; 11/19/2015: ILEOSTOMY CLOSURE; N/A     Comment:  Procedure: ILEOSTOMY TAKEDOWN;  Surgeon: Gladis Riffle, MD;  Location: [facility];  Service: General;                Laterality: N/A; 09/11/2015: LAPAROSCOPIC PARTIAL COLECTOMY; N/A     Comment:  Procedure: LAPAROSCOPIC PARTIAL COLECTOMY Possible Open,              possible ostomy, repair of colovesical fistula Cystoscopy              and stent placement, repair of colovesical fistula with               Dr. Sherryl Barters as well ;  Surgeon: Gladis Riffle, MD;                Location: [facility];  Service: General;  Laterality: N/A; 08/16/2015: POLYPECTOMY     Comment:  Procedure: POLYPECTOMY;  Surgeon: Midge Minium, MD;                Location: [facility];  Service: Endoscopy;; 1970: TONSILLECTOMY  BMI    Body Mass Index: 30.80 kg/m      Reproductive/Obstetrics negative OB ROS                             Anesthesia Physical Anesthesia Plan  ASA: 2  Anesthesia Plan: General   Post-op Pain Management: Minimal or no pain anticipated   Induction: Intravenous  PONV Risk Score and Plan: 1 and Propofol infusion and TIVA  Airway Management Planned: Natural Airway  and Nasal Cannula  Additional Equipment:   Intra-op Plan:   Post-operative Plan:   Informed Consent: I have reviewed the patients History and Physical, chart, labs and discussed the procedure including the risks, benefits and alternatives for the proposed anesthesia with the patient or authorized representative who has indicated his/her understanding and acceptance.     Dental Advisory Given  Plan Discussed with: Anesthesiologist, CRNA and Surgeon  Anesthesia Plan Comments: (Patient consented for risks of anesthesia including but not limited to:  - adverse reactions to medications - risk of airway placement if required - damage to eyes, teeth, lips or other oral mucosa - nerve damage due to positioning  - sore throat or hoarseness - Damage to heart, brain, nerves, lungs, other parts of body or loss of life  Patient voiced understanding.)       Anesthesia Quick Evaluation

## 2023-02-26 NOTE — Transfer of Care (Signed)
Immediate Anesthesia Transfer of Care Note  Patient: Peter [name]  Procedure(s) Performed: COLONOSCOPY WITH PROPOFOL  Patient Location: PACU  Anesthesia Type:General  Level of Consciousness: drowsy  Airway & Oxygen Therapy: Patient Spontanous Breathing and Patient connected to face mask oxygen  Post-op Assessment: Report given to RN, Post -op Vital signs reviewed and stable, and Patient moving all extremities X 4  Post vital signs: Reviewed and stable  Last Vitals:  Vitals Value Taken Time  BP 117/80 02/26/23 0953  Temp    Pulse 82 02/26/23 0954  Resp 12 02/26/23 0954  SpO2 98 % 02/26/23 0954  Vitals shown include unfiled device data.  Last Pain:  Vitals:   02/26/23 0944  TempSrc: (P) Temporal  PainSc:          Complications: No notable events documented.

## 2023-02-26 NOTE — H&P (Signed)
[name], MD [address], [place], [place], [address] [address], [place], [place], [address] Phone: [phone]  Fax: [phone]  Primary Care Physician:  Margarita Mail, DO   Pre-Procedure History & Physical: HPI:  Peter [name] is a 57 y.o. male is here for an colonoscopy.   Past Medical History:  Diagnosis Date   Arthritis    neck, shoulders   COPD (chronic obstructive pulmonary disease) (HCC)    Diverticulitis    Generalized headaches    GERD (gastroesophageal reflux disease)    occ   History of colonoscopy with polypectomy 09/08/2018   Aug 16, 2015   Lower back pain    Rectal pain    Thrombosed hemorrhoids     Past Surgical History:  Procedure Laterality Date   ANTERIOR CERVICAL DECOMPRESSION/DISCECTOMY FUSION 4 LEVELS N/A 02/15/2019   Procedure: ANTERIOR CERVICAL DECOMPRESSION/DISCECTOMY FUSION 4 LEVELS C3-7;  Surgeon: Venetia Night, MD;  Location: [facility];  Service: Neurosurgery;  Laterality: N/A;   APPENDECTOMY  1988   BLADDER REPAIR N/A 09/11/2015   Procedure: BLADDER REPAIR;  Surgeon: Hildred Laser, MD;  Location: [facility];  Service: Urology;  Laterality: N/A;   CHOLECYSTECTOMY  1989   COLON SURGERY     for diverticulitis   COLONOSCOPY N/A 02/14/2021   Procedure: COLONOSCOPY;  Surgeon: Pasty Spillers, MD;  Location: [facility];  Service: Endoscopy;  Laterality: N/A;   COLONOSCOPY WITH PROPOFOL N/A 08/16/2015   Procedure: COLONOSCOPY WITH PROPOFOL;  Surgeon: Midge Minium, MD;  Location: [facility];  Service: Endoscopy;  Laterality: N/A;   CYSTOSCOPY WITH STENT PLACEMENT Bilateral 09/11/2015   Procedure: CYSTOSCOPY WITH STENT PLACEMENT;  Surgeon: Hildred Laser, MD;  Location: [facility];  Service: Urology;  Laterality: Bilateral;   ILEOSTOMY CLOSURE N/A 11/19/2015   Procedure: ILEOSTOMY TAKEDOWN;  Surgeon: Gladis Riffle, MD;  Location: [facility];  Service: General;  Laterality: N/A;    LAPAROSCOPIC PARTIAL COLECTOMY N/A 09/11/2015   Procedure: LAPAROSCOPIC PARTIAL COLECTOMY Possible Open, possible ostomy, repair of colovesical fistula Cystoscopy and stent placement, repair of colovesical fistula with Dr. Sherryl Barters as well ;  Surgeon: Gladis Riffle, MD;  Location: [facility];  Service: General;  Laterality: N/A;   POLYPECTOMY  08/16/2015   Procedure: POLYPECTOMY;  Surgeon: Midge Minium, MD;  Location: [facility];  Service: Endoscopy;;   TONSILLECTOMY  1970    Prior to Admission medications   Medication Sig Start Date End Date Taking? Authorizing Provider  DULoxetine (CYMBALTA) 30 MG capsule TAKE 1 CAPSULE BY MOUTH EVERY DAY 02/11/23  Yes Margarita Mail, DO  fluticasone-salmeterol (ADVAIR DISKUS) 250-50 MCG/ACT AEPB Inhale 1 puff into the lungs in the morning and at bedtime. 11/26/22  Yes Mecum, Erin E, PA-C  albuterol (VENTOLIN HFA) 108 (90 Base) MCG/ACT inhaler Inhale 2 puffs into the lungs every 6 (six) hours as needed for wheezing or shortness of breath. 11/26/22   Mecum, Erin E, PA-C    Allergies as of 01/06/2023   (No Known Allergies)    Family History  Problem Relation Age of Onset   Cholecystitis Mother    Lung cancer Father    Emphysema Paternal Grandmother    Diabetes Paternal Grandmother    Prostate cancer Maternal Uncle    Diabetes Brother    Hypertension Brother     Social History   Socioeconomic History   Marital status: Single    Spouse name: Not on file   Number of children:  Not on file   Years of education: Not on file   Highest education level: GED or equivalent  Occupational History   Not on file  Tobacco Use   Smoking status: Every Day    Current packs/day: 1.50    Average packs/day: 1.5 packs/day for 43.6 years (65.5 ttl pk-yrs)    Types: Cigarettes    Start date: 28   Smokeless tobacco: Never  Vaping Use   Vaping status: Never Used  Substance and Sexual Activity   Alcohol use: Not Currently    Comment: Occasionally    Drug use: Not Currently    Types: Marijuana    Comment: has not used any in 2 months   Sexual activity: Not Currently  Other Topics Concern   Not on file  Social History Narrative   Not on file   Social Determinants of Health   Financial Resource Strain: Low Risk  (12/15/2022)   Overall Financial Resource Strain (CARDIA)    Difficulty of Paying Living Expenses: Not hard at all  Food Insecurity: No Food Insecurity (12/15/2022)   Hunger Vital Sign    Worried About Running Out of Food in the Last Year: Never true    Ran Out of Food in the Last Year: Never true  Transportation Needs: No Transportation Needs (12/15/2022)   PRAPARE - Administrator, Civil Service (Medical): No    Lack of Transportation (Non-Medical): No  Physical Activity: Inactive (12/15/2022)   Exercise Vital Sign    Days of Exercise per Week: 0 days    Minutes of Exercise per Session: 0 min  Stress: Stress Concern Present (12/15/2022)   [employer] of Occupational Health - Occupational Stress Questionnaire    Feeling of Stress : To some extent  Social Connections: Socially Isolated (12/15/2022)   Social Connection and Isolation Panel [NHANES]    Frequency of Communication with Friends and Family: More than three times a week    Frequency of Social Gatherings with Friends and Family: More than three times a week    Attends Religious Services: Never    Database administrator or Organizations: No    Attends Banker Meetings: Never    Marital Status: Never married  Intimate Partner Violence: Not At Risk (12/15/2022)   Humiliation, Afraid, Rape, and Kick questionnaire    Fear of Current or Ex-Partner: No    Emotionally Abused: No    Physically Abused: No    Sexually Abused: No    Review of Systems: See HPI, otherwise negative ROS  Physical Exam: BP 126/86   Pulse 77   Temp (!) 96.4 F (35.8 C) (Temporal)   Resp 18   Ht 5\' 10"  (1.778 m)   Wt 97.4 kg   SpO2 99%   BMI 30.80 kg/m   General:   Alert,  pleasant and cooperative in NAD Head:  Normocephalic and atraumatic. Neck:  Supple; no masses or thyromegaly. Lungs:  Clear throughout to auscultation, normal respiratory effort.    Heart:  +S1, +S2, Regular rate and rhythm, No edema. Abdomen:  Soft, nontender and nondistended. Normal bowel sounds, without guarding, and without rebound.   Neurologic:  Alert and  oriented x4;  grossly normal neurologically.  Impression/Plan: Peter [name] is here for an colonoscopy to be performed for Screening colonoscopy average risk   Risks, benefits, limitations, and alternatives regarding  colonoscopy have been reviewed with the patient.  Questions have been answered.  All parties agreeable.   [name], MD  02/26/2023, 8:50 AM

## 2023-03-01 ENCOUNTER — Encounter: Payer: Self-pay | Admitting: Gastroenterology

## 2023-03-12 ENCOUNTER — Institutional Professional Consult (permissible substitution): Payer: BLUE CROSS/BLUE SHIELD | Admitting: Student in an Organized Health Care Education/Training Program

## 2023-03-25 ENCOUNTER — Ambulatory Visit: Payer: Self-pay | Admitting: Surgery

## 2023-06-15 ENCOUNTER — Other Ambulatory Visit: Payer: Self-pay | Admitting: *Deleted

## 2023-06-15 DIAGNOSIS — F17210 Nicotine dependence, cigarettes, uncomplicated: Secondary | ICD-10-CM

## 2023-06-15 DIAGNOSIS — Z122 Encounter for screening for malignant neoplasm of respiratory organs: Secondary | ICD-10-CM

## 2023-06-15 DIAGNOSIS — Z87891 Personal history of nicotine dependence: Secondary | ICD-10-CM

## 2023-06-18 ENCOUNTER — Other Ambulatory Visit: Payer: Self-pay | Admitting: Acute Care

## 2023-06-18 DIAGNOSIS — F17210 Nicotine dependence, cigarettes, uncomplicated: Secondary | ICD-10-CM

## 2023-06-18 NOTE — Patient Instructions (Signed)

## 2023-06-18 NOTE — Progress Notes (Signed)
 Virtual Visit via Telephone Note  I connected with Peter [name] on 06/18/23 at 10:30 AM EST by telephone and verified that I am speaking with the correct person using two identifiers.  Location: Patient: in home Provider: [address], [place], [place], [address]     Shared Decision Making Visit Lung Cancer Screening Program [phone])   Eligibility: Age 58 y.o. Pack Years Smoking History Calculation 64.5 (# packs/per year x # years smoked) Recent History of coughing up blood  no Unexplained weight loss? no ( >Than 15 pounds within the last 6 months ) Prior History Lung / other cancer no (Diagnosis within the last 5 years already requiring surveillance chest CT Scans). Smoking Status Current Smoker Former Smokers: Years since quit:  NA  Quit Date: NA  Visit Components: Discussion included one or more decision making aids. yes Discussion included risk/benefits of screening. yes Discussion included potential follow up diagnostic testing for abnormal scans. yes Discussion included meaning and risk of over diagnosis. yes Discussion included meaning and risk of False Positives. yes Discussion included meaning of total radiation exposure. yes  Counseling Included: Importance of adherence to annual lung cancer LDCT screening. yes Impact of comorbidities on ability to participate in the program. yes Ability and willingness to under diagnostic treatment. yes  Smoking Cessation Counseling: Current Smokers:  Discussed importance of smoking cessation. yes Information about tobacco cessation classes and interventions provided to patient. yes Patient provided with "ticket" for LDCT Scan. yes Symptomatic Patient. no  Counseling NA Diagnosis Code: Tobacco Use Z72.0 Asymptomatic Patient yes  Counseling (Intermediate counseling: > three minutes counseling) [id number] Former Smokers:  Discussed the importance of maintaining cigarette abstinence. yes Diagnosis Code: Personal History  of Nicotine Dependence. W10.272 Information about tobacco cessation classes and interventions provided to patient. Yes Patient provided with "ticket" for LDCT Scan. yes Written Order for Lung Cancer Screening with LDCT placed in Epic. Yes (CT Chest Lung Cancer Screening Low Dose W/O CM) [id number] Z12.2-Screening of respiratory organs Z87.891-Personal history of nicotine dependence   Karl Bales, RN 06/18/23  Pt reminded to take blue card with them to appointment

## 2023-06-21 ENCOUNTER — Ambulatory Visit: Payer: Self-pay

## 2023-07-02 ENCOUNTER — Ambulatory Visit
Admission: RE | Admit: 2023-07-02 | Discharge: 2023-07-02 | Disposition: A | Discharge status: Home / Self Care | Payer: No Typology Code available for payment source | Source: Ambulatory Visit | Attending: Internal Medicine

## 2023-07-02 DIAGNOSIS — F17210 Nicotine dependence, cigarettes, uncomplicated: Secondary | ICD-10-CM

## 2023-07-02 DIAGNOSIS — Z122 Encounter for screening for malignant neoplasm of respiratory organs: Secondary | ICD-10-CM

## 2023-07-02 DIAGNOSIS — Z87891 Personal history of nicotine dependence: Secondary | ICD-10-CM

## 2023-07-12 ENCOUNTER — Other Ambulatory Visit: Payer: Self-pay

## 2023-07-12 DIAGNOSIS — Z122 Encounter for screening for malignant neoplasm of respiratory organs: Secondary | ICD-10-CM

## 2023-07-12 DIAGNOSIS — Z87891 Personal history of nicotine dependence: Secondary | ICD-10-CM

## 2023-07-12 DIAGNOSIS — F17210 Nicotine dependence, cigarettes, uncomplicated: Secondary | ICD-10-CM

## 2023-07-22 ENCOUNTER — Ambulatory Visit: Payer: Self-pay | Admitting: Family Medicine

## 2023-07-22 DIAGNOSIS — Z113 Encounter for screening for infections with a predominantly sexual mode of transmission: Secondary | ICD-10-CM

## 2023-07-22 LAB — HM HIV SCREENING LAB: HM HIV Screening: NEGATIVE

## 2023-07-22 NOTE — Progress Notes (Signed)
Pt here for STI screening.  No in house labs performed.  Condoms declined. Pt to call if any questions or concerns.-Marx Doig, RN

## 2023-07-22 NOTE — Progress Notes (Signed)
[facility] Department STI clinic [address], [address] [place] [address] Main phone: [phone]  STI screening visit  Subjective:  Peter [name] is a 58 y.o. male being seen today for an STI screening visit. The patient reports they do not have symptoms.    Patient has the following medical conditions:   Patient Active Problem List   Diagnosis Date Noted   Lumbar foraminal stenosis (Bilateral: L2, L3, L4) (R>L: L5) 11/05/2021   Lumbar lateral recess stenosis 11/05/2021   Chronic lower extremity pain (Right) 11/05/2021   Lumbar radiculopathy (Right) 11/05/2021   Chronic anticoagulation (Plaquenil) 11/05/2021   Chronic low back pain (Right) w/o sciatica 07/24/2021   History of colonic polyps    Elevated sed rate 02/03/2021   Vitamin D insufficiency 02/03/2021   DDD (degenerative disc disease), cervical 02/03/2021   Osteoarthritis of AC (acromioclavicular) joint (Right) 02/03/2021   Osteoarthritis of AC (acromioclavicular) joint (Left) 02/03/2021   Cervical foraminal stenosis (Multilevel) (Bilateral) 02/03/2021   Lumbar facet hypertrophy 02/03/2021   Lumbosacral facet joint syndrome 02/03/2021   Spondylosis without myelopathy or radiculopathy, lumbosacral region 02/03/2021   Psoriatic arthritis (HCC) 01/23/2021   Hyperlipidemia 01/17/2021   Chronic pain syndrome 11/04/2020   Pharmacologic therapy 11/04/2020   Disorder of skeletal system 11/04/2020   Problems influencing health status 11/04/2020   Abnormal MRI, thoracic spine (07/26/2020) 11/04/2020   Abnormal MRI, lumbar spine (07/26/2020) 11/04/2020   Abnormal MRI, cervical spine (01/11/2019) 11/04/2020   Retrolisthesis (T12/L1) & (L1/L2) 11/04/2020   Anterolisthesis of lumbosacral spine (L5/S1) 11/04/2020   Cervical fusion syndrome (ACDF C3-7) 11/04/2020   Chronic low back pain (1ry area of Pain) (Bilateral) (R>L) w/o sciatica 11/04/2020   Chronic mid back pain (Bilateral) (R>L) 11/04/2020    Cervicalgia 11/04/2020   Chronic neck pain (3ry area of Pain) (Bilateral) (R>L) w/ history of cervical spinal surgery 11/04/2020   Cervicogenic headaches (4th area of Pain) (Bilateral) (R>L) 11/04/2020   Occipital headaches (Bilateral) (R>L) 11/04/2020   Chronic upper extremity pain (5th area of Pain) (Bilateral) (R>L) 11/04/2020   Chronic wrist pain (7th area of Pain) (Bilateral) (R>L) 11/04/2020   Primary osteoarthritis involving multiple joints 06/24/2020   Rheumatoid arthritis involving multiple sites with positive rheumatoid factor (HCC) 06/24/2020   Postherpetic neuralgia 04/10/2020   Herpes zoster without complication 04/04/2020   Reactive depression 04/04/2020   Chronic fatigue 02/02/2020   S/P cervical spinal fusion 02/15/2019   Preventative health care 09/08/2018   Ventral hernia without obstruction or gangrene 09/08/2018   History of colonoscopy with polypectomy 09/08/2018   Hx of lower gastrointestinal bleeding 09/08/2018   Obesity (BMI 30.0-34.9) 04/26/2018   COPD (chronic obstructive pulmonary disease) (HCC) 04/26/2018   Arthritis of right hand 04/26/2018   Arthritis of left hand 04/26/2018   DDD (degenerative disc disease), lumbosacral 04/26/2018   Chronic knee pain (6th area of Pain) (Bilateral) (R>L) 04/26/2018   Dry skin 04/26/2018   Tobacco dependence 08/20/2015   Rectal polyp    Cervical radiculopathy 06/22/2015   Displacement of intervertebral disc at C6-C7 level 06/22/2015   Chronic shoulder pain (2ry area of Pain) (Bilateral) (R>L) 06/22/2015    Chief Complaint  Patient presents with   SEXUALLY TRANSMITTED DISEASE    No symptoms, routine check    HPI HPI Patient reports to clinic for STI screening  Last HIV test per patient/review of record was No results found for: "HMHIVSCREEN"  Lab Results  Component Value Date   HIV Non Reactive 01/14/2021  Last HEPC test per patient/review of record was No results found for: "HMHEPCSCREEN" No components  found for: "HEPC"   Last HEPB test per patient/review of record was No components found for: "HMHEPBSCREEN" No components found for: "HEPC"   Does the patient or their partner desires a pregnancy in the next year? No  Screening for MPX risk: Does the patient have an unexplained rash? No Is the patient MSM? No Does the patient endorse multiple sex partners or anonymous sex partners? No Did the patient have close or sexual contact with a person diagnosed with MPX? No Has the patient traveled outside the [place] where MPX is endemic? No Is there a high clinical suspicion for MPX-- evidenced by one of the following No  -Unlikely to be chickenpox  -Lymphadenopathy  -Rash that present in same phase of evolution on any given body part   See flowsheet for further details and programmatic requirements.   Immunization History  Administered Date(s) Administered   PNEUMOCOCCAL CONJUGATE-20 12/15/2022   Pneumococcal Polysaccharide-23 09/12/2015   Tdap 04/26/2018, 02/18/2022     The following portions of the patient's history were reviewed and updated as appropriate: allergies, current medications, past medical history, past social history, past surgical history and problem list.  Objective:  There were no vitals filed for this visit.  Physical Exam Vitals and nursing note reviewed.  Constitutional:      Appearance: Normal appearance.  HENT:     Head: Normocephalic and atraumatic.     Mouth/Throat:     Mouth: Mucous membranes are moist.     Pharynx: No oropharyngeal exudate or posterior oropharyngeal erythema.  Eyes:     General:        Right eye: No discharge.        Left eye: No discharge.     Conjunctiva/sclera:     Right eye: Right conjunctiva is not injected. No exudate.    Left eye: Left conjunctiva is not injected. No exudate. Pulmonary:     Effort: Pulmonary effort is normal.  Abdominal:     General: Abdomen is flat.     Palpations: Abdomen is soft. There is no hepatomegaly  or mass.     Tenderness: There is no abdominal tenderness. There is no rebound.  Genitourinary:    Comments: Declined genital exam- asymptomatic Lymphadenopathy:     Cervical: No cervical adenopathy.     Upper Body:     Right upper body: No supraclavicular or axillary adenopathy.     Left upper body: No supraclavicular or axillary adenopathy.  Skin:    General: Skin is warm and dry.  Neurological:     Mental Status: He is alert and oriented to person, place, and time.     Assessment and Plan:  Peter [name] is a 58 y.o. male presenting to the [facility] Department for STI screening  1. Screening for venereal disease (Primary)  - Chlamydia/GC NAA, Confirmation - HIV [place] [facility] - Syphilis Serology, [facility]   Patient does not have STI symptoms Patient accepted all screenings including  urine GC/Chlamydia, and blood work for HIV/Syphilis. Patient meets criteria for HepB screening? No. Ordered? not applicable Patient meets criteria for HepC screening? No. Ordered? not applicable Recommended condom use with all sex Discussed importance of condom use for STI prevention  Treat positive test results per standing order. Discussed time line for [facility] results and that patient will be called with positive results and encouraged patient to call if he had  not heard in 2 weeks Recommended repeat testing in 3 months with positive results. Recommended returning for continued or worsening symptoms.   Return if symptoms worsen or fail to improve, for STI screening.  Future Appointments  Date Time Provider Department Center  12/16/2023  9:00 AM Margarita Mail, DO [facility]    Lenice Llamas, [place]

## 2023-07-24 LAB — CHLAMYDIA/GC NAA, CONFIRMATION
Chlamydia trachomatis, NAA: NEGATIVE
Neisseria gonorrhoeae, NAA: NEGATIVE

## 2023-08-06 ENCOUNTER — Telehealth: Payer: Self-pay | Admitting: Family Medicine

## 2023-08-06 NOTE — Telephone Encounter (Signed)
Pt notified that all of his results were available.  He will come by Monday and pick up a copy of HIV and Syphilis.  Berdie Ogren, RN

## 2023-08-31 ENCOUNTER — Telehealth: Payer: Self-pay | Admitting: Family Medicine

## 2023-08-31 NOTE — Telephone Encounter (Signed)
 Pt. Wants clarification on results from tests done on Jan. 16. 2025.

## 2023-10-25 ENCOUNTER — Other Ambulatory Visit: Payer: Self-pay | Admitting: Internal Medicine

## 2023-10-25 DIAGNOSIS — J440 Chronic obstructive pulmonary disease with acute lower respiratory infection: Secondary | ICD-10-CM

## 2023-10-25 NOTE — Telephone Encounter (Signed)
 Copied from CRM [id number]. Topic: Clinical - Medication Refill >> Oct 25, 2023 10:38 AM Felecia Hopper H wrote: Most Recent Primary Care Visit:  Provider: Rockney Cid  Department: [facility]  Visit Type: OFFICE VISIT  Date: 01/18/2023  Medication: fluticasone -salmeterol (ADVAIR DISKUS) 250-50 MCG/ACT AEPB [[id number]]  Has the patient contacted their pharmacy? No (Agent: If no, request that the patient contact the pharmacy for the refill. If patient does not wish to contact the pharmacy document the reason why and proceed with request.) (Agent: If yes, when and what did the pharmacy advise?)  Is this the correct pharmacy for this prescription? Yes If no, delete pharmacy and type the correct one.  This is the patient's preferred pharmacy:   [facility] [address] [place] - [address] [place] [address] Phone: [phone] Fax: [phone]     Has the prescription been filled recently? No  Is the patient out of the medication? Yes  Has the patient been seen for an appointment in the last year OR does the patient have an upcoming appointment? Yes  Can we respond through MyChart? No  Agent: Please be advised that Rx refills may take up to 3 business days. We ask that you follow-up with your pharmacy.

## 2023-10-26 MED ORDER — ALBUTEROL SULFATE HFA 108 (90 BASE) MCG/ACT IN AERS
2.0000 | INHALATION_SPRAY | Freq: Four times a day (QID) | RESPIRATORY_TRACT | 0 refills | Status: DC | PRN
Start: 2023-10-26 — End: 2023-11-18

## 2023-10-26 NOTE — Telephone Encounter (Signed)
 Requested Prescriptions  Pending Prescriptions Disp Refills   albuterol  (VENTOLIN  HFA) 108 (90 Base) MCG/ACT inhaler 8 g 0    Sig: Inhale 2 puffs into the lungs every 6 (six) hours as needed for wheezing or shortness of breath.     Pulmonology:  Beta Agonists 2 Failed - 10/26/2023 10:45 AM      Failed - Valid encounter within last 12 months    Recent Outpatient Visits   None     Future Appointments             In 1 month Rockney Cid, DO [place] [facility]            Passed - Last BP in normal range    BP Readings from Last 1 Encounters:  02/26/23 124/84         Passed - Last Heart Rate in normal range    Pulse Readings from Last 1 Encounters:  02/26/23 87

## 2023-11-17 ENCOUNTER — Ambulatory Visit: Payer: Self-pay

## 2023-11-17 ENCOUNTER — Other Ambulatory Visit: Payer: Self-pay | Admitting: Internal Medicine

## 2023-11-17 DIAGNOSIS — J440 Chronic obstructive pulmonary disease with acute lower respiratory infection: Secondary | ICD-10-CM

## 2023-11-17 MED ORDER — FLUTICASONE-SALMETEROL 250-50 MCG/ACT IN AEPB: 1.0000 | INHALATION_SPRAY | Freq: Two times a day (BID) | RESPIRATORY_TRACT | 2 refills | Status: DC

## 2023-11-17 NOTE — Telephone Encounter (Signed)
 Copied from CRM [id number]. Topic: Clinical - Red Word Triage >> Nov 17, 2023 10:57 AM El Gravely T wrote: [facility] that prompted transfer to Nurse Triage: chest pain   Chief Complaint: Cough Symptoms: Cough, green sputum production, chest pain with cough, shortness of breath  Frequency: Frequent  Pertinent Negatives: Patient denies fever Disposition: [] ED /[x] Urgent Care (no appt availability in office) / [] Appointment(In office/virtual)/ []  [facility]/ [] Home Care/ [] Refused Recommended Disposition /[] [facility]/ []  Follow-up with PCP Additional Notes: Patient reports he has had a cough for the last 6+ months. He states his cough is productive of green and white sputum. He states he is also experiencing increased shortness of breath and chest pain with his cough. He denies any fevers. Patient states he would not be able to come in for an appointment until 5/23. Patient advised to go to urgent care for evaluation and treatment of his symptoms. Patient verbalized understanding and agreement. Patient has also requested a refill for his Advair inhaler, which I have placed a request for.     Reason for Disposition  [1] Known COPD or other severe lung disease (i.e., bronchiectasis, cystic fibrosis, lung surgery) AND [2] worsening symptoms (i.e., increased sputum purulence or amount, increased breathing difficulty  Answer Assessment - Initial Assessment Questions 1. ONSET: "When did the cough begin?"      Before christmas  2. SEVERITY: "How bad is the cough today?"      Moderate  3. SPUTUM: "Describe the color of your sputum" (none, dry cough; clear, white, yellow, green)     White or green 4. HEMOPTYSIS: "Are you coughing up any blood?" If so ask: "How much?" (flecks, streaks, tablespoons, etc.)     No 5. DIFFICULTY BREATHING: "Are you having difficulty breathing?" If Yes, ask: "How bad is it?" (e.g., mild, moderate, severe)    - MILD: No SOB at rest, mild SOB with walking,  speaks normally in sentences, can lie down, no retractions, pulse < 100.    - MODERATE: SOB at rest, SOB with minimal exertion and prefers to sit, cannot lie down flat, speaks in phrases, mild retractions, audible wheezing, pulse 100-120.    - SEVERE: Very SOB at rest, speaks in single words, struggling to breathe, sitting hunched forward, retractions, pulse > 120      Mild to moderate  6. FEVER: "Do you have a fever?" If Yes, ask: "What is your temperature, how was it measured, and when did it start?"     No 7. CARDIAC HISTORY: "Do you have any history of heart disease?" (e.g., heart attack, congestive heart failure)      No 8. LUNG HISTORY: "Do you have any history of lung disease?"  (e.g., pulmonary embolus, asthma, emphysema)     COPD 9. PE RISK FACTORS: "Do you have a history of blood clots?" (or: recent major surgery, recent prolonged travel, bedridden)     No 10. OTHER SYMPTOMS: "Do you have any other symptoms?" (e.g., runny nose, wheezing, chest pain)       Chest pain with cough  Protocols used: Cough - Acute Productive-A-AH

## 2023-11-18 NOTE — Telephone Encounter (Signed)
 Requested Prescriptions  Pending Prescriptions Disp Refills   albuterol  (VENTOLIN  HFA) 108 (90 Base) MCG/ACT inhaler [Pharmacy Med Name: ALBUTEROL  HFA (VENTOLIN ) INH] 18 each 0    Sig: TAKE 2 PUFFS BY MOUTH EVERY 6 HOURS AS NEEDED FOR WHEEZE OR SHORTNESS OF BREATH     Pulmonology:  Beta Agonists 2 Failed - 11/18/2023  1:43 PM      Failed - Valid encounter within last 12 months    Recent Outpatient Visits   None     Future Appointments             In 4 weeks Peter Cid, DO [place] [facility]            Passed - Last BP in normal range    BP Readings from Last 1 Encounters:  02/26/23 124/84         Passed - Last Heart Rate in normal range    Pulse Readings from Last 1 Encounters:  02/26/23 87

## 2023-12-15 NOTE — Progress Notes (Deleted)
 Name: Peter [name]   MRN: [id number]    DOB: 25-Aug-1965   Date:12/15/2023       Progress Note  Subjective  Chief Complaint  No chief complaint on file.   HPI  Patient presents for annual CPE ***.    Diet: *** Exercise: *** Last Dental Exam: **** Last Eye Exam: ***  Depression: phq 9 is {gen pos neg:315643}    01/18/2023    8:23 AM 12/15/2022    9:13 AM 11/26/2022   11:10 AM 06/12/2021   10:07 AM 05/22/2021    8:24 AM  Depression screen PHQ 2/9  Decreased Interest 3 3 3  0 0  Down, Depressed, Hopeless 3 3 2  0 0  PHQ - 2 Score 6 6 5  0 0  Altered sleeping 3 0 1    Tired, decreased energy 3 2 3     Change in appetite 2 2 2     Feeling bad or failure about yourself  2 2 1     Trouble concentrating 2 2 1     Moving slowly or fidgety/restless 2 0 0    Suicidal thoughts 2 0 0    PHQ-9 Score 22 14 13     Difficult doing work/chores Extremely dIfficult Somewhat difficult Very difficult      Hypertension:  BP Readings from Last 3 Encounters:  02/26/23 124/84  01/18/23 136/84  12/15/22 118/84    Obesity: Wt Readings from Last 3 Encounters:  02/26/23 214 lb 10.2 oz (97.4 kg)  01/18/23 221 lb 14.4 oz (100.7 kg)  12/15/22 220 lb (99.8 kg)   BMI Readings from Last 3 Encounters:  02/26/23 30.80 kg/m  01/18/23 31.84 kg/m  12/15/22 31.57 kg/m     Flowsheet Row Office Visit from 12/15/2022 in [facility]  AUDIT-C Score 0      ***  Single STD testing and prevention (HIV/chl/gon/syphilis):  no Sexual history:  Hep C Screening: completed Skin cancer: Discussed monitoring for atypical lesions Colorectal cancer: 02/26/2023 Prostate cancer:  yes Lab Results  Component Value Date   PSA 0.65 12/15/2022   PSA 0.37 01/17/2021   PSA 0.5 02/02/2020     Lung cancer:  Low Dose CT Chest recommended if Age 62-80 years, 30 pack-year currently smoking OR have quit w/in 15years. Patient  {ACTION; IS/IS WUJ:[id number]} a candidate for screening   AAA: The  USPSTF recommends one-time screening with ultrasonography in men ages 27 to 75 years who have ever smoked. Patient   {ACTION; IS/IS GNF:[id number]} a candidate for screening  ECG:  ***  Vaccines:*** reviewed with the patient.   Advanced Care Planning: A voluntary discussion about advance care planning including the explanation and discussion of advance directives.  Discussed health care proxy and Living will, and the patient was able to identify a health care proxy as ***.  Patient {DOES_DOES HQI:69629} have a living will and power of attorney of health care   Patient Active Problem List   Diagnosis Date Noted   Lumbar foraminal stenosis (Bilateral: L2, L3, L4) (R>L: L5) 11/05/2021   Lumbar lateral recess stenosis 11/05/2021   Chronic lower extremity pain (Right) 11/05/2021   Lumbar radiculopathy (Right) 11/05/2021   Chronic anticoagulation (Plaquenil) 11/05/2021   Chronic low back pain (Right) w/o sciatica 07/24/2021   History of colonic polyps    Elevated sed rate 02/03/2021   Vitamin D  insufficiency 02/03/2021   DDD (degenerative disc disease), cervical 02/03/2021   Osteoarthritis of AC (acromioclavicular) joint (Right) 02/03/2021   Osteoarthritis of AC (acromioclavicular) joint (  Left) 02/03/2021   Cervical foraminal stenosis (Multilevel) (Bilateral) 02/03/2021   Lumbar facet hypertrophy 02/03/2021   Lumbosacral facet joint syndrome 02/03/2021   Spondylosis without myelopathy or radiculopathy, lumbosacral region 02/03/2021   Psoriatic arthritis (HCC) 01/23/2021   Hyperlipidemia 01/17/2021   Chronic pain syndrome 11/04/2020   Pharmacologic therapy 11/04/2020   Disorder of skeletal system 11/04/2020   Problems influencing health status 11/04/2020   Abnormal MRI, thoracic spine (07/26/2020) 11/04/2020   Abnormal MRI, lumbar spine (07/26/2020) 11/04/2020   Abnormal MRI, cervical spine (01/11/2019) 11/04/2020   Retrolisthesis (T12/L1) & (L1/L2) 11/04/2020   Anterolisthesis of  lumbosacral spine (L5/S1) 11/04/2020   Cervical fusion syndrome (ACDF C3-7) 11/04/2020   Chronic low back pain (1ry area of Pain) (Bilateral) (R>L) w/o sciatica 11/04/2020   Chronic mid back pain (Bilateral) (R>L) 11/04/2020   Cervicalgia 11/04/2020   Chronic neck pain (3ry area of Pain) (Bilateral) (R>L) w/ history of cervical spinal surgery 11/04/2020   Cervicogenic headaches (4th area of Pain) (Bilateral) (R>L) 11/04/2020   Occipital headaches (Bilateral) (R>L) 11/04/2020   Chronic upper extremity pain (5th area of Pain) (Bilateral) (R>L) 11/04/2020   Chronic wrist pain (7th area of Pain) (Bilateral) (R>L) 11/04/2020   Primary osteoarthritis involving multiple joints 06/24/2020   Rheumatoid arthritis involving multiple sites with positive rheumatoid factor (HCC) 06/24/2020   Postherpetic neuralgia 04/10/2020   Herpes zoster without complication 04/04/2020   Reactive depression 04/04/2020   Chronic fatigue 02/02/2020   S/P cervical spinal fusion 02/15/2019   Preventative health care 09/08/2018   Ventral hernia without obstruction or gangrene 09/08/2018   History of colonoscopy with polypectomy 09/08/2018   Hx of lower gastrointestinal bleeding 09/08/2018   Obesity (BMI 30.0-34.9) 04/26/2018   COPD (chronic obstructive pulmonary disease) (HCC) 04/26/2018   Arthritis of right hand 04/26/2018   Arthritis of left hand 04/26/2018   DDD (degenerative disc disease), lumbosacral 04/26/2018   Chronic knee pain (6th area of Pain) (Bilateral) (R>L) 04/26/2018   Dry skin 04/26/2018   Tobacco dependence 08/20/2015   Rectal polyp    Cervical radiculopathy 06/22/2015   Displacement of intervertebral disc at C6-C7 level 06/22/2015   Chronic shoulder pain (2ry area of Pain) (Bilateral) (R>L) 06/22/2015    Past Surgical History:  Procedure Laterality Date   ANTERIOR CERVICAL DECOMPRESSION/DISCECTOMY FUSION 4 LEVELS N/A 02/15/2019   Procedure: ANTERIOR CERVICAL DECOMPRESSION/DISCECTOMY FUSION 4  LEVELS C3-7;  Surgeon: Jodeen Munch, MD;  Location: [facility] ORS;  Service: Neurosurgery;  Laterality: N/A;   APPENDECTOMY  1988   BLADDER REPAIR N/A 09/11/2015   Procedure: BLADDER REPAIR;  Surgeon: Bart Born, MD;  Location: [facility];  Service: Urology;  Laterality: N/A;   CHOLECYSTECTOMY  1989   COLON SURGERY     for diverticulitis   COLONOSCOPY N/A 02/14/2021   Procedure: COLONOSCOPY;  Surgeon: Irby Mannan, MD;  Location: [facility] END[facility];  Service: Endoscopy;  Laterality: N/A;   COLONOSCOPY WITH PROPOFOL  N/A 08/16/2015   Procedure: COLONOSCOPY WITH PROPOFOL ;  Surgeon: Marnee Sink, MD;  Location: [facility];  Service: Endoscopy;  Laterality: N/A;   COLONOSCOPY WITH PROPOFOL  N/A 02/26/2023   Procedure: COLONOSCOPY WITH PROPOFOL ;  Surgeon: Luke Salaam, MD;  Location: [facility];  Service: Gastroenterology;  Laterality: N/A;   CYSTOSCOPY WITH STENT PLACEMENT Bilateral 09/11/2015   Procedure: CYSTOSCOPY WITH STENT PLACEMENT;  Surgeon: Bart Born, MD;  Location: [facility];  Service: Urology;  Laterality: Bilateral;   ILEOSTOMY CLOSURE N/A 11/19/2015   Procedure: ILEOSTOMY TAKEDOWN;  Surgeon: Kandis Ormond, MD;  Location: [facility] OR[facility];  Service: General;  Laterality: N/A;   LAPAROSCOPIC PARTIAL COLECTOMY N/A 09/11/2015   Procedure: LAPAROSCOPIC PARTIAL COLECTOMY Possible Open, possible ostomy, repair of colovesical fistula Cystoscopy and stent placement, repair of colovesical fistula with Dr. Domingo Friend as well ;  Surgeon: Kandis Ormond, MD;  Location: [facility] OR[facility];  Service: General;  Laterality: N/A;   POLYPECTOMY  08/16/2015   Procedure: POLYPECTOMY;  Surgeon: Marnee Sink, MD;  Location: [facility];  Service: Endoscopy;;   TONSILLECTOMY  1970    Family History  Problem Relation Age of Onset   Cholecystitis Mother    Lung cancer Father    Emphysema Paternal Grandmother    Diabetes Paternal Grandmother    Prostate cancer Maternal Uncle    Diabetes  Brother    Hypertension Brother     Social History   Socioeconomic History   Marital status: Single    Spouse name: Not on file   Number of children: Not on file   Years of education: Not on file   Highest education level: GED or equivalent  Occupational History   Not on file  Tobacco Use   Smoking status: Every Day    Current packs/day: 1.50    Average packs/day: 1.5 packs/day for 44.4 years (66.7 ttl pk-yrs)    Types: Cigarettes    Start date: 56   Smokeless tobacco: Never  Vaping Use   Vaping status: Never Used  Substance and Sexual Activity   Alcohol use: Not Currently    Comment: Occasionally   Drug use: Not Currently    Types: Marijuana    Comment: has not used any in 2 months   Sexual activity: Not Currently  Other Topics Concern   Not on file  Social History Narrative   Not on file   Social Drivers of Health   Financial Resource Strain: Low Risk  (12/15/2022)   Overall Financial Resource Strain (CARDIA)    Difficulty of Paying Living Expenses: Not hard at all  Food Insecurity: No Food Insecurity (12/15/2022)   Hunger Vital Sign    Worried About Running Out of Food in the Last Year: Never true    Ran Out of Food in the Last Year: Never true  Transportation Needs: No Transportation Needs (12/15/2022)   PRAPARE - Administrator, Civil Service (Medical): No    Lack of Transportation (Non-Medical): No  Physical Activity: Inactive (12/15/2022)   Exercise Vital Sign    Days of Exercise per Week: 0 days    Minutes of Exercise per Session: 0 min  Stress: Stress Concern Present (12/15/2022)   [employer] of Occupational Health - Occupational Stress Questionnaire    Feeling of Stress : To some extent  Social Connections: Socially Isolated (12/15/2022)   Social Connection and Isolation Panel [NHANES]    Frequency of Communication with Friends and Family: More than three times a week    Frequency of Social Gatherings with Friends and Family: More than  three times a week    Attends Religious Services: Never    Database administrator or Organizations: No    Attends Banker Meetings: Never    Marital Status: Never married  Intimate Partner Violence: Not At Risk (12/15/2022)   Humiliation, Afraid, Rape, and Kick questionnaire    Fear of Current or Ex-Partner: No    Emotionally Abused: No    Physically Abused: No    Sexually Abused: No     Current Outpatient Medications:  albuterol  (VENTOLIN  HFA) 108 (90 Base) MCG/ACT inhaler, TAKE 2 PUFFS BY MOUTH EVERY 6 HOURS AS NEEDED FOR WHEEZE OR SHORTNESS OF BREATH, Disp: 18 each, Rfl: 0   DULoxetine  (CYMBALTA ) 30 MG capsule, TAKE 1 CAPSULE BY MOUTH EVERY DAY, Disp: 90 capsule, Rfl: 3   fluticasone -salmeterol (ADVAIR DISKUS) 250-50 MCG/ACT AEPB, Inhale 1 puff into the lungs in the morning and at bedtime., Disp: 60 each, Rfl: 2  No Known Allergies   ROS  ***   Objective  There were no vitals filed for this visit.  There is no height or weight on file to calculate BMI.  Physical Exam ***  {Show previous labs (optional):23779}  Assessment & Plan There are no diagnoses linked to this encounter.   -Prostate cancer screening and PSA options (with potential risks and benefits of testing vs not testing) were discussed along with recent recs/guidelines. -USPSTF grade A and B recommendations reviewed with patient; age-appropriate recommendations, preventive care, screening tests, etc discussed and encouraged; healthy living encouraged; see AVS for patient education given to patient -Discussed importance of 150 minutes of physical activity weekly, eat two servings of fish weekly, eat one serving of tree nuts ( cashews, pistachios, pecans, almonds.Aaron Aas) every other day, eat 6 servings of fruit/vegetables daily and drink plenty of water  and avoid sweet beverages.  -Reviewed Health Maintenance: {yes/no/default n/a:21102::not applicable}

## 2023-12-16 ENCOUNTER — Encounter: Payer: No Typology Code available for payment source | Admitting: Internal Medicine

## 2023-12-16 ENCOUNTER — Other Ambulatory Visit: Payer: Self-pay | Admitting: Physician Assistant

## 2023-12-16 DIAGNOSIS — J440 Chronic obstructive pulmonary disease with acute lower respiratory infection: Secondary | ICD-10-CM

## 2023-12-17 NOTE — Telephone Encounter (Signed)
 Requested Prescriptions  Pending Prescriptions Disp Refills   WIXELA INHUB 250-50 MCG/ACT AEPB [Pharmacy Med Name: WIXELA 250-50 INHUB] 60 each 2    Sig: INHALE 1 PUFF INTO THE LUNGS IN THE MORNING AND AT BEDTIME.     Pulmonology:  Combination Products Failed - 12/17/2023  1:06 PM      Failed - Valid encounter within last 12 months    Recent Outpatient Visits   None

## 2024-03-23 ENCOUNTER — Other Ambulatory Visit: Payer: Self-pay | Admitting: Internal Medicine

## 2024-03-23 DIAGNOSIS — J440 Chronic obstructive pulmonary disease with acute lower respiratory infection: Secondary | ICD-10-CM

## 2024-03-24 NOTE — Telephone Encounter (Signed)
 Requested medications are due for refill today.  Unsure  Requested medications are on the active medications list.  yes  Last refill. 12/17/2023 60 2 rf  Future visit scheduled.   no  Notes to clinic.  Pt last seen 12/17/2023. Pt has missed scheduled appts. Unsure how long this inhaler should last.    Requested Prescriptions  Pending Prescriptions Disp Refills   WIXELA INHUB 250-50 MCG/ACT AEPB [Pharmacy Med Name: WIXELA 250-50 INHUB] 60 each 2    Sig: INHALE 1 PUFF INTO THE LUNGS IN THE MORNING AND AT BEDTIME.     Pulmonology:  Combination Products Failed - 03/24/2024 11:21 AM      Failed - Valid encounter within last 12 months    Recent Outpatient Visits   None

## 2024-08-03 ENCOUNTER — Encounter: Admitting: Internal Medicine

## 2024-08-08 ENCOUNTER — Encounter: Payer: Self-pay | Admitting: Internal Medicine

## 2024-08-08 ENCOUNTER — Other Ambulatory Visit: Payer: Self-pay

## 2024-08-08 ENCOUNTER — Ambulatory Visit: Admitting: Internal Medicine

## 2024-08-08 VITALS — BP 124/72 | HR 94 | Temp 98.1°F | Resp 16 | Ht 70.0 in | Wt 217.4 lb

## 2024-08-08 DIAGNOSIS — J440 Chronic obstructive pulmonary disease with acute lower respiratory infection: Secondary | ICD-10-CM | POA: Diagnosis not present

## 2024-08-08 DIAGNOSIS — K439 Ventral hernia without obstruction or gangrene: Secondary | ICD-10-CM

## 2024-08-08 DIAGNOSIS — E7800 Pure hypercholesterolemia, unspecified: Secondary | ICD-10-CM

## 2024-08-08 DIAGNOSIS — Z125 Encounter for screening for malignant neoplasm of prostate: Secondary | ICD-10-CM

## 2024-08-08 DIAGNOSIS — R7303 Prediabetes: Secondary | ICD-10-CM

## 2024-08-08 MED ORDER — FLUTICASONE-SALMETEROL 250-50 MCG/ACT IN AEPB: 1.0000 | INHALATION_SPRAY | Freq: Two times a day (BID) | RESPIRATORY_TRACT | 5 refills | Status: AC

## 2024-08-08 NOTE — Progress Notes (Signed)
 "  Acute Office Visit  Subjective:     Patient ID: Peter [name], male    DOB: [birth year], 59 y.o.   MRN: [id number]  Chief Complaint  Patient presents with   Referral    Surgery for hernia, painful   Abdominal Pain    Referral to Gastroenterology, Urology      Abdominal Pain   Patient is in today for worsening abdominal wall hernia.   Discussed the use of AI scribe software for clinical note transcription with the patient, who gave verbal consent to proceed.  History of Present Illness Peter [name] is a 59 year old male who presents for follow-up and medication refill.  He has a large abdominal wall hernia containing bowel loops, present for several years and first seen on CT in June 2024. 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 He is concerned about possible complications and previously saw a surgeon about this in 2015-2016.  He has chronic respiratory issues and uses Wixela daily as a maintenance inhaler, with about a week of medication left. He avoids his albuterol  rescue inhaler because it causes wheezing and lightheadedness. He felt Advair controlled his breathing better and wants to switch back. Required mask use at work makes his breathing feel more difficult.  Past surgeries include neck, colon, appendix, and gallbladder operations. He stopped duloxetine  for pain and anxiety due to lack of benefit. His last labs over a year ago showed prediabetes.  He received a flu shot this year for work and previously received a pneumonia vaccine.   Review of Systems  Respiratory:  Positive for cough and shortness of breath.   Gastrointestinal:  Positive for abdominal pain.        Objective:    BP 124/72 (Cuff Size: Large)   Pulse 94   Temp 98.1 F (36.7 C) (Oral)   Resp 16   Ht 5' 10 (1.778 m)   Wt 217 lb 6.4 oz (98.6 kg)   SpO2 93%   BMI 31.19 kg/m  BP Readings  from Last 3 Encounters:  08/08/24 124/72  02/26/23 124/84  01/18/23 136/84   Wt Readings from Last 3 Encounters:  08/08/24 217 lb 6.4 oz (98.6 kg)  02/26/23 214 lb 10.2 oz (97.4 kg)  01/18/23 221 lb 14.4 oz (100.7 kg)      Physical Exam Constitutional:      Appearance: Normal appearance.  HENT:     Head: Normocephalic and atraumatic.     Mouth/Throat:     Mouth: Mucous membranes are moist.     Pharynx: Oropharynx is clear.  Eyes:     Extraocular Movements: Extraocular movements intact.     Conjunctiva/sclera: Conjunctivae normal.     Pupils: Pupils are equal, round, and reactive to light.  Neck:     Comments: No thyromegaly  Cardiovascular:     Rate and Rhythm: Normal rate and regular rhythm.  Pulmonary:     Effort: Pulmonary effort is normal.     Breath sounds: Wheezing present.  Abdominal:     Hernia: A hernia is present.  Musculoskeletal:     Cervical back: No tenderness.  Lymphadenopathy:     Cervical: No cervical adenopathy.  Skin:    General: Skin is warm and dry.  Neurological:     General: No focal deficit present.     Mental Status: He is alert. Mental status is at baseline.  Psychiatric:  Mood and Affect: Mood normal.        Behavior: Behavior normal.     No results found for any visits on 08/08/24.      Assessment & Plan:   Assessment & Plan Ventral hernia Large ventral hernia with obstructive bowel loops, risk of obstruction or strangulation. Previous elective repair discussion with Dr. Lane, now feasible due to insurance coverage. - Referred back to Dr. Lane for hernia repair evaluation.  Chronic obstructive pulmonary disease COPD managed with Wixela, good control reported. Adverse effects with albuterol . Insurance change from Advair to Wixela, interested in returning to Advair. - Refilled Wixela inhaler. - Attempted switch to Advair if covered by insurance. - Advised to contact if Advair is not covered or  ineffective.  Pre-Diabetes -Recheck A1c with labs.   Hyperlipidemia Not on medications, recheck cholesterol today.   General health maintenance Routine health maintenance discussed, vaccinations up to date, labs overdue. - Ordered labs: kidney, liver, electrolytes, cholesterol, prostate cancer screening. - Ensured flu and pneumonia vaccines are up to date.  - Ambulatory referral to General Surgery - CBC w/Diff/Platelet - Comprehensive Metabolic Panel (CMET) - Lipid Profile - HgB A1c - PSA   Return in about 6 months (around 02/05/2025).  Sharyle Fischer, DO   "

## 2024-08-09 ENCOUNTER — Ambulatory Visit: Payer: Self-pay | Admitting: Internal Medicine

## 2024-08-09 LAB — CBC WITH DIFFERENTIAL/PLATELET
Absolute Lymphocytes: 1298 {cells}/uL (ref 850–3900)
Absolute Monocytes: 347 {cells}/uL (ref 200–950)
Basophils Absolute: 61 {cells}/uL (ref 0–200)
Basophils Relative: 1.1 %
Eosinophils Absolute: 61 {cells}/uL (ref 15–500)
Eosinophils Relative: 1.1 %
HCT: 47.3 % (ref 39.4–51.1)
Hemoglobin: 16.4 g/dL (ref 13.2–17.1)
MCH: 32.2 pg (ref 27.0–33.0)
MCHC: 34.7 g/dL (ref 31.6–35.4)
MCV: 92.9 fL (ref 81.4–101.7)
MPV: 11.1 fL (ref 7.5–12.5)
Monocytes Relative: 6.3 %
Neutro Abs: 3735 {cells}/uL (ref 1500–7800)
Neutrophils Relative %: 67.9 %
Platelets: 202 10*3/uL (ref 140–400)
RBC: 5.09 Million/uL (ref 4.20–5.80)
RDW: 13.4 % (ref 11.0–15.0)
Total Lymphocyte: 23.6 %
WBC: 5.5 10*3/uL (ref 3.8–10.8)

## 2024-08-09 LAB — COMPREHENSIVE METABOLIC PANEL WITH GFR
AG Ratio: 1.9 (calc) (ref 1.0–2.5)
ALT: 13 U/L (ref 9–46)
AST: 17 U/L (ref 10–35)
Albumin: 4.6 g/dL (ref 3.6–5.1)
Alkaline phosphatase (APISO): 58 U/L (ref 35–144)
BUN: 10 mg/dL (ref 7–25)
CO2: 34 mmol/L — ABNORMAL HIGH (ref 20–32)
Calcium: 9.7 mg/dL (ref 8.6–10.3)
Chloride: 101 mmol/L (ref 98–110)
Creat: 1.17 mg/dL (ref 0.70–1.30)
Globulin: 2.4 g/dL (ref 1.9–3.7)
Glucose, Bld: 98 mg/dL (ref 65–99)
Potassium: 4.7 mmol/L (ref 3.5–5.3)
Sodium: 143 mmol/L (ref 135–146)
Total Bilirubin: 0.3 mg/dL (ref 0.2–1.2)
Total Protein: 7 g/dL (ref 6.1–8.1)
eGFR: 72 mL/min/{1.73_m2}

## 2024-08-09 LAB — HEMOGLOBIN A1C
Hgb A1c MFr Bld: 5.3 %
Mean Plasma Glucose: 105 mg/dL
eAG (mmol/L): 5.8 mmol/L

## 2024-08-09 LAB — LIPID PANEL
Cholesterol: 146 mg/dL
HDL: 40 mg/dL
LDL Cholesterol (Calc): 87 mg/dL
Non-HDL Cholesterol (Calc): 106 mg/dL
Total CHOL/HDL Ratio: 3.7 (calc)
Triglycerides: 91 mg/dL

## 2024-08-09 LAB — PSA: PSA: 0.67 ng/mL

## 2024-08-16 ENCOUNTER — Encounter: Admitting: Internal Medicine

## 2024-08-23 ENCOUNTER — Ambulatory Visit: Payer: Self-pay | Admitting: Surgery

## 2025-02-05 ENCOUNTER — Ambulatory Visit: Admitting: Internal Medicine
